# Patient Record
Sex: Male | Born: 1950 | Race: Black or African American | Hispanic: No | Marital: Married | Smoking: Former smoker
Health system: Southern US, Community
[De-identification: ages and names within clinical notes are randomized; demographics above are authoritative.]

## PROBLEM LIST (undated history)

## (undated) DIAGNOSIS — I10 Essential (primary) hypertension: Secondary | ICD-10-CM

## (undated) DIAGNOSIS — I219 Acute myocardial infarction, unspecified: Secondary | ICD-10-CM

## (undated) DIAGNOSIS — R4182 Altered mental status, unspecified: Secondary | ICD-10-CM

## (undated) DIAGNOSIS — M545 Low back pain, unspecified: Secondary | ICD-10-CM

## (undated) DIAGNOSIS — G8929 Other chronic pain: Secondary | ICD-10-CM

## (undated) DIAGNOSIS — I5022 Chronic systolic (congestive) heart failure: Secondary | ICD-10-CM

## (undated) DIAGNOSIS — I2510 Atherosclerotic heart disease of native coronary artery without angina pectoris: Secondary | ICD-10-CM

## (undated) DIAGNOSIS — M1990 Unspecified osteoarthritis, unspecified site: Secondary | ICD-10-CM

## (undated) DIAGNOSIS — E785 Hyperlipidemia, unspecified: Secondary | ICD-10-CM

## (undated) DIAGNOSIS — I160 Hypertensive urgency: Secondary | ICD-10-CM

## (undated) DIAGNOSIS — M797 Fibromyalgia: Secondary | ICD-10-CM

## (undated) DIAGNOSIS — E119 Type 2 diabetes mellitus without complications: Secondary | ICD-10-CM

## (undated) DIAGNOSIS — I639 Cerebral infarction, unspecified: Secondary | ICD-10-CM

## (undated) HISTORY — PX: ANKLE FRACTURE SURGERY: SHX122

## (undated) HISTORY — DX: Altered mental status, unspecified: R41.82

## (undated) HISTORY — PX: CORONARY ANGIOPLASTY WITH STENT PLACEMENT: SHX49

## (undated) HISTORY — PX: FRACTURE SURGERY: SHX138

---

## 2010-07-20 DIAGNOSIS — I639 Cerebral infarction, unspecified: Secondary | ICD-10-CM

## 2010-07-20 HISTORY — DX: Cerebral infarction, unspecified: I63.9

## 2013-07-20 DIAGNOSIS — I219 Acute myocardial infarction, unspecified: Secondary | ICD-10-CM

## 2013-07-20 HISTORY — DX: Acute myocardial infarction, unspecified: I21.9

## 2014-05-09 ENCOUNTER — Emergency Department (HOSPITAL_COMMUNITY): Payer: Medicaid Other

## 2014-05-09 ENCOUNTER — Inpatient Hospital Stay (HOSPITAL_COMMUNITY)
Admission: EM | Admit: 2014-05-09 | Discharge: 2014-05-12 | DRG: 247 | Disposition: A | Discharge status: Home / Self Care | Payer: Medicaid Other | Attending: Cardiology | Admitting: Cardiology

## 2014-05-09 ENCOUNTER — Encounter (HOSPITAL_COMMUNITY): Payer: Self-pay | Admitting: Emergency Medicine

## 2014-05-09 DIAGNOSIS — I255 Ischemic cardiomyopathy: Secondary | ICD-10-CM

## 2014-05-09 DIAGNOSIS — I200 Unstable angina: Secondary | ICD-10-CM | POA: Diagnosis present

## 2014-05-09 DIAGNOSIS — I5189 Other ill-defined heart diseases: Secondary | ICD-10-CM

## 2014-05-09 DIAGNOSIS — I959 Hypotension, unspecified: Secondary | ICD-10-CM | POA: Diagnosis not present

## 2014-05-09 DIAGNOSIS — I10 Essential (primary) hypertension: Secondary | ICD-10-CM

## 2014-05-09 DIAGNOSIS — R079 Chest pain, unspecified: Secondary | ICD-10-CM

## 2014-05-09 DIAGNOSIS — T465X5A Adverse effect of other antihypertensive drugs, initial encounter: Secondary | ICD-10-CM | POA: Diagnosis not present

## 2014-05-09 DIAGNOSIS — Z7982 Long term (current) use of aspirin: Secondary | ICD-10-CM

## 2014-05-09 DIAGNOSIS — R000 Tachycardia, unspecified: Secondary | ICD-10-CM | POA: Diagnosis present

## 2014-05-09 DIAGNOSIS — I25110 Atherosclerotic heart disease of native coronary artery with unstable angina pectoris: Secondary | ICD-10-CM

## 2014-05-09 DIAGNOSIS — I519 Heart disease, unspecified: Secondary | ICD-10-CM

## 2014-05-09 DIAGNOSIS — I2510 Atherosclerotic heart disease of native coronary artery without angina pectoris: Secondary | ICD-10-CM | POA: Diagnosis present

## 2014-05-09 DIAGNOSIS — I252 Old myocardial infarction: Secondary | ICD-10-CM

## 2014-05-09 DIAGNOSIS — I9589 Other hypotension: Secondary | ICD-10-CM

## 2014-05-09 DIAGNOSIS — E1140 Type 2 diabetes mellitus with diabetic neuropathy, unspecified: Secondary | ICD-10-CM | POA: Diagnosis present

## 2014-05-09 DIAGNOSIS — E119 Type 2 diabetes mellitus without complications: Secondary | ICD-10-CM

## 2014-05-09 DIAGNOSIS — Z794 Long term (current) use of insulin: Secondary | ICD-10-CM

## 2014-05-09 DIAGNOSIS — E869 Volume depletion, unspecified: Secondary | ICD-10-CM | POA: Diagnosis present

## 2014-05-09 DIAGNOSIS — E1165 Type 2 diabetes mellitus with hyperglycemia: Secondary | ICD-10-CM | POA: Diagnosis present

## 2014-05-09 DIAGNOSIS — Z9861 Coronary angioplasty status: Secondary | ICD-10-CM

## 2014-05-09 DIAGNOSIS — Z8673 Personal history of transient ischemic attack (TIA), and cerebral infarction without residual deficits: Secondary | ICD-10-CM

## 2014-05-09 DIAGNOSIS — I5022 Chronic systolic (congestive) heart failure: Secondary | ICD-10-CM | POA: Diagnosis present

## 2014-05-09 DIAGNOSIS — E785 Hyperlipidemia, unspecified: Secondary | ICD-10-CM | POA: Diagnosis present

## 2014-05-09 DIAGNOSIS — R072 Precordial pain: Secondary | ICD-10-CM | POA: Insufficient documentation

## 2014-05-09 HISTORY — DX: Hyperlipidemia, unspecified: E78.5

## 2014-05-09 HISTORY — DX: Chronic systolic (congestive) heart failure: I50.22

## 2014-05-09 HISTORY — DX: Essential (primary) hypertension: I10

## 2014-05-09 HISTORY — DX: Atherosclerotic heart disease of native coronary artery without angina pectoris: I25.10

## 2014-05-09 HISTORY — DX: Cerebral infarction, unspecified: I63.9

## 2014-05-09 LAB — BASIC METABOLIC PANEL
Anion gap: 17 — ABNORMAL HIGH (ref 5–15)
BUN: 16 mg/dL (ref 6–23)
CALCIUM: 9.8 mg/dL (ref 8.4–10.5)
CO2: 24 mEq/L (ref 19–32)
CREATININE: 1.17 mg/dL (ref 0.50–1.35)
Chloride: 95 mEq/L — ABNORMAL LOW (ref 96–112)
GFR calc non Af Amer: 65 mL/min — ABNORMAL LOW (ref >90)
GFR, EST AFRICAN AMERICAN: 75 mL/min — AB (ref >90)
Glucose, Bld: 449 mg/dL — ABNORMAL HIGH (ref 70–99)
Potassium: 4.3 mEq/L (ref 3.7–5.3)
Sodium: 136 mEq/L — ABNORMAL LOW (ref 137–147)

## 2014-05-09 LAB — I-STAT TROPONIN, ED: Troponin i, poc: 0.02 ng/mL (ref 0.00–0.08)

## 2014-05-09 LAB — CBC
HCT: 39.5 % (ref 39.0–52.0)
Hemoglobin: 14.3 g/dL (ref 13.0–17.0)
MCH: 28.1 pg (ref 26.0–34.0)
MCHC: 36.2 g/dL — AB (ref 30.0–36.0)
MCV: 77.6 fL — AB (ref 78.0–100.0)
PLATELETS: 204 10*3/uL (ref 150–400)
RBC: 5.09 MIL/uL (ref 4.22–5.81)
RDW: 13 % (ref 11.5–15.5)
WBC: 4.6 10*3/uL (ref 4.0–10.5)

## 2014-05-09 LAB — CBG MONITORING, ED: Glucose-Capillary: 424 mg/dL — ABNORMAL HIGH (ref 70–99)

## 2014-05-09 LAB — PRO B NATRIURETIC PEPTIDE: Pro B Natriuretic peptide (BNP): 855.8 pg/mL — ABNORMAL HIGH (ref 0–125)

## 2014-05-09 LAB — TROPONIN I

## 2014-05-09 LAB — PROTIME-INR
INR: 1.03 (ref 0.00–1.49)
Prothrombin Time: 13.6 seconds (ref 11.6–15.2)

## 2014-05-09 MED ORDER — SODIUM CHLORIDE 0.9 % IV SOLN
INTRAVENOUS | Status: AC
  Administered 2014-05-10 (×2): via INTRAVENOUS

## 2014-05-09 MED ORDER — CARVEDILOL 12.5 MG PO TABS
12.5000 mg | ORAL_TABLET | Freq: Two times a day (BID) | ORAL | Status: DC
  Administered 2014-05-09 – 2014-05-10 (×2): 12.5 mg via ORAL
  Filled 2014-05-09 (×3): qty 1

## 2014-05-09 MED ORDER — NITROGLYCERIN 0.4 MG SL SUBL
0.4000 mg | SUBLINGUAL_TABLET | SUBLINGUAL | Status: AC | PRN
  Administered 2014-05-09 (×3): 0.4 mg via SUBLINGUAL
  Filled 2014-05-09: qty 1

## 2014-05-09 MED ORDER — ASPIRIN EC 325 MG PO TBEC
325.0000 mg | DELAYED_RELEASE_TABLET | Freq: Once | ORAL | Status: AC
  Administered 2014-05-09: 325 mg via ORAL
  Filled 2014-05-09: qty 1

## 2014-05-09 MED ORDER — INSULIN GLARGINE 100 UNIT/ML ~~LOC~~ SOLN
22.0000 [IU] | Freq: Every day | SUBCUTANEOUS | Status: DC
  Administered 2014-05-10 – 2014-05-11 (×3): 22 [IU] via SUBCUTANEOUS
  Filled 2014-05-09 (×4): qty 0.22

## 2014-05-09 MED ORDER — INSULIN GLARGINE 100 UNIT/ML ~~LOC~~ SOLN
16.0000 [IU] | Freq: Every day | SUBCUTANEOUS | Status: DC
  Administered 2014-05-10 – 2014-05-12 (×3): 16 [IU] via SUBCUTANEOUS
  Filled 2014-05-09 (×3): qty 0.16

## 2014-05-09 MED ORDER — INSULIN GLARGINE 100 UNIT/ML ~~LOC~~ SOLN: 16.0000 [IU] | Freq: Two times a day (BID) | SUBCUTANEOUS | Status: DC

## 2014-05-09 NOTE — ED Provider Notes (Signed)
CSN: [id number]     Arrival date & time 05/09/14  2044 History   First MD Initiated Contact with Patient 05/09/14 2138     Chief Complaint  Patient presents with  . Chest Pain    Patient is a 63 y.o. male presenting with chest pain. The history is provided by the patient.  Chest Pain Pain location:  Substernal area Pain quality comment:  "it just hurts" Pain radiates to:  Does not radiate Pain radiates to the back: no   Onset quality:  Gradual Duration:  2 weeks Timing:  Constant Progression:  Worsening Chronicity:  Recurrent ("it feels like a heart attack") Worsened by:  Nothing tried Ineffective treatments:  Aspirin Associated symptoms: no abdominal pain, no back pain, no cough, no fever, no headache, no nausea, no shortness of breath and not vomiting    Patient with a history of CAD status post stents (not currently anticoagulated) presents with chest pain. Pain is been ongoing for weeks but the patient reports that the pain got worse today and was sharp in nature. Pain does not radiate to his neck, jaw, arm or back. Patient takes 81 aspirin per day. Patient reports leg swelling.  Past Medical History  Diagnosis Date  . Coronary artery disease   . Diabetes mellitus without complication   . Hypertension   . Stroke    Past Surgical History  Procedure Laterality Date  . Ankle surgery    . Coronary stent placement     No family history on file. History  Substance Use Topics  . Smoking status: Never Smoker   . Smokeless tobacco: Not on file  . Alcohol Use: No    Review of Systems  Constitutional: Negative for fever and chills.  HENT: Negative for rhinorrhea and sore throat.   Eyes: Negative for visual disturbance.  Respiratory: Negative for cough and shortness of breath.   Cardiovascular: Positive for chest pain and leg swelling.  Gastrointestinal: Negative for nausea, vomiting, abdominal pain, diarrhea and constipation.  Genitourinary: Negative for dysuria and  hematuria.  Musculoskeletal: Negative for back pain and neck pain.  Skin: Negative for rash.  Neurological: Negative for syncope and headaches.  Psychiatric/Behavioral: Negative for confusion.  All other systems reviewed and are negative.     Allergies  Review of patient's allergies indicates no known allergies.  Home Medications   Prior to Admission medications   Medication Sig Start Date End Date Taking? Authorizing Provider  aspirin EC 81 MG tablet Take 81 mg by mouth daily.   Yes Historical Provider, MD  furosemide (LASIX) 20 MG tablet Take 20 mg by mouth daily.   Yes Historical Provider, MD  insulin glargine (LANTUS) 100 UNIT/ML injection Inject 16-22 Units into the skin 2 (two) times daily. Take 16 units every morning and 22 units at bedtime.   Yes Historical Provider, MD   BP 140/98  Pulse 127  Temp(Src) 98.6 F (37 C) (Oral)  Resp 24  SpO2 98% Physical Exam  Constitutional: He is oriented to person, place, and time. He appears well-developed and well-nourished. No distress.  HENT:  Head: Normocephalic and atraumatic.  Mouth/Throat: Oropharynx is clear and moist.  Eyes: EOM are normal.  Neck: Neck supple. No JVD present.  Cardiovascular: Regular rhythm, normal heart sounds and intact distal pulses.  Tachycardia present.   Pulmonary/Chest: Effort normal and breath sounds normal.  Abdominal: Soft. He exhibits no distension. There is no tenderness.  Musculoskeletal: Normal range of motion. He exhibits no edema.  Chronic LE  skin changes bilaterally, no pitting edema  Neurological: He is alert and oriented to person, place, and time. No cranial nerve deficit.  Skin: Skin is warm and dry.  Psychiatric: His behavior is normal.    ED Course  Procedures  None  Labs Review Labs Reviewed  CBC - Abnormal; Notable for the following:    MCV 77.6 (*)    MCHC 36.2 (*)    All other components within normal limits  BASIC METABOLIC PANEL - Abnormal; Notable for the  following:    Sodium 136 (*)    Chloride 95 (*)    Glucose, Bld 449 (*)    GFR calc non Af Amer 65 (*)    GFR calc Af Amer 75 (*)    Anion gap 17 (*)    All other components within normal limits  PRO B NATRIURETIC PEPTIDE - Abnormal; Notable for the following:    Pro B Natriuretic peptide (BNP) 855.8 (*)    All other components within normal limits  PROTIME-INR  TROPONIN I  Randolm Idol, ED    Imaging Review Dg Chest 2 View  05/09/2014   CLINICAL DATA:  63 year old male with acute chest pain and shortness of Breath. Initial encounter.  EXAM: CHEST  2 VIEW  COMPARISON:  None.  FINDINGS: Normal lung volumes. Normal cardiac size and mediastinal contours. Visualized tracheal air column is within normal limits. The lungs are clear. No pneumothorax or effusion. No osseous abnormality identified.  IMPRESSION: Negative, no acute cardiopulmonary abnormality.   Electronically Signed   By: Lars Pinks M.D.   On: 05/09/2014 21:23     EKG Interpretation   Date/Time:  Wednesday May 09 2014 20:48:17 EDT Ventricular Rate:  137 PR Interval:  132 QRS Duration: 92 QT Interval:  300 QTC Calculation: 453 R Axis:   -152 Text Interpretation:  Sinus tachycardia Biatrial enlargement Right  superior axis deviation Pulmonary disease pattern ST \\T \ T wave  abnormality, consider inferolateral ischemia Abnormal ECG Confirmed by  ZAVITZ  MD, JOSHUA ([id number]) on 05/09/2014 10:30:31 PM      MDM   Final diagnoses:  Chest pain, unspecified chest pain type    Patient with history of CAD status post stents presents with chest pain. EKG demonstrates sinus tachycardia, rate 121, nonspecific T wave changes. No ST segment elevation or depression. No prior EKG. Patient is from [place], [place] and had cardiology management there.  BNP elevated at 855. Cardiology consulted and will admit patient. Per their recommendation, Coreg 12.5 mg twice a day started in the ED.   Case discussed with Dr. Reather Converse.   Gustavus Bryant, MD 05/09/14 2250

## 2014-05-09 NOTE — ED Provider Notes (Signed)
Medical screening examination/treatment/procedure(s) were conducted as a shared visit with non-physician practitioner(s) or resident  and myself.  I personally evaluated the patient during the encounter and agree with the findings.   I have personally reviewed any xrays and/ or EKG's with the provider and I agree with interpretation.   Patient with known coronary disease blood pressure presents with intermittent left chest pain for the past couple weeks. Patient has had recent exertional symptoms.  Patient has 2 cardiac stents and stop taking blood thinners after 2 years per his report. On exam patient denies significant chest pain at this time, nontoxic appearing, lungs clear, tachycardic, mild dry mucous membranes, blood pressure elevated in ER. Cardiology evaluated and admitted for further evaluation and treatment. EKG reviewed, multiple T wave inversions, no old on file. Pt had asa. Dg Chest 2 View  05/09/2014   CLINICAL DATA:  63 year old male with acute chest pain and shortness of Breath. Initial encounter.  EXAM: CHEST  2 VIEW  COMPARISON:  None.  FINDINGS: Normal lung volumes. Normal cardiac size and mediastinal contours. Visualized tracheal air column is within normal limits. The lungs are clear. No pneumothorax or effusion. No osseous abnormality identified.  IMPRESSION: Negative, no acute cardiopulmonary abnormality.   Electronically Signed   By: Lars Pinks M.D.   On: 05/09/2014 21:23   Acute chest pain, CAD  Mariea Clonts, MD 05/09/14 2303

## 2014-05-09 NOTE — ED Notes (Signed)
Pt. reports intermittent left chest pain for several weeks worse this evening with SOB / feet swelling . Denies diaphoresis and nausea. History of CAD with 2 coronary stents.

## 2014-05-09 NOTE — H&P (Signed)
Patient ID: Antonio Guerra MRN: [id number], DOB/AGE: 62-Sep-1952   Admit date: 05/09/2014   Primary Physician: No primary provider on file. Primary Cardiologist: None  Chief complaint: Chest pain  Problem List  Past Medical History  Diagnosis Date  . Coronary artery disease   . Diabetes mellitus without complication   . Hypertension   . Stroke     Past Surgical History  Procedure Laterality Date  . Ankle surgery    . Coronary stent placement       Allergies  No Known Allergies  HPI 63M with HTN, DM controlled with insulin, CAD s/p PCI in 2010-2011 (at [facility] in [place]), CHF by report presenting with 1 month of chest discomfort worsening in the last 24 hours. He reports a longstanding history of chest discomfort for the past month or so, exacerbated primarily by stress, but states that this morning his chest discomfort significantly worsened. He described it as an aching pressure worsened by exertion and stress, alleviated by rest. Given the acute worsening of his symptoms he presented to the ED today.  At home, he reports that he is not active but this is due primarily to leg pain thought to be 2/2 diabetic neuropathy. He does not describe shortness of breath or chest pain at rest.  In the ED,  HR 134 regular, BP 156/90, O2 sat 100% on RA. He was given SL NTG x 3 with resolution of his symptoms. His first cTn was negative.   Risk factors:  HTN - denies HLD - Y DM - yes, poorly controlled CAD - Y Smoker - N  Home Medications  Prior to Admission medications   Medication Sig Start Date End Date Taking? Authorizing Provider  aspirin EC 81 MG tablet Take 81 mg by mouth daily.   Yes Historical Provider, MD  furosemide (LASIX) 20 MG tablet Take 20 mg by mouth daily.   Yes Historical Provider, MD  insulin glargine (LANTUS) 100 UNIT/ML injection Inject 16-22 Units into the skin 2 (two) times daily. Take 16 units every morning and 22 units at bedtime.   Yes  Historical Provider, MD    Family History  Noncontributory  Social History  History   Social History  . Marital Status: Single    Spouse Name: N/A    Number of Children: N/A  . Years of Education: N/A   Occupational History  . Not on file.   Social History Main Topics  . Smoking status: Never Smoker   . Smokeless tobacco: Not on file  . Alcohol Use: No  . Drug Use: No  . Sexual Activity: Not on file   Other Topics Concern  . Not on file   Social History Narrative  . No narrative on file     Review of Systems General:  No chills, fever, night sweats or weight changes.  Cardiovascular:  + chest pain, dyspnea on exertion, denies edema, orthopnea, palpitations, paroxysmal nocturnal dyspnea. Dermatological: No rash, lesions/masses Respiratory: No cough, dyspnea Urologic: No hematuria, dysuria Abdominal:   No nausea, vomiting, diarrhea, bright red blood per rectum, melena, or hematemesis Neurologic:  No visual changes, wkns, changes in mental status. No history of bleeding All other systems reviewed and are otherwise negative except as noted above.  Physical Exam  Blood pressure 140/97, pulse 125, temperature 98.6 F (37 C), temperature source Oral, resp. rate 20, SpO2 97.00%.  General: Pleasant, NAD Psych: Normal affect. Neuro: Alert and oriented X 3. Moves all extremities spontaneously. HEENT: Normal  Neck: Supple without bruits or JVD. Lungs:  Resp regular and unlabored, CTA. Heart: RRR no s3, s4, or murmurs. Abdomen: Soft, non-tender, non-distended, BS + x 4.  Extremities: No clubbing, cyanosis or edema. DP/PT/Radials 2+ and equal bilaterally.  Labs  Troponin [facility] of Care Test)  Recent Labs  05/09/14 2102  TROPIPOC 0.02    Recent Labs  05/09/14 2053  TROPONINI <0.30   Lab Results  Component Value Date   WBC 4.6 05/09/2014   HGB 14.3 05/09/2014   HCT 39.5 05/09/2014   MCV 77.6* 05/09/2014   PLT 204 05/09/2014    Recent Labs Lab  05/09/14 2054  NA 136*  K 4.3  CL 95*  CO2 24  BUN 16  CREATININE 1.17  CALCIUM 9.8  GLUCOSE 449*   No results found for this basename: CHOL, HDL, LDLCALC, TRIG   No results found for this basename: DDIMER     Radiology/Studies  Dg Chest 2 View  05/09/2014   CLINICAL DATA:  63 year old male with acute chest pain and shortness of Breath. Initial encounter.  EXAM: CHEST  2 VIEW  COMPARISON:  None.  FINDINGS: Normal lung volumes. Normal cardiac size and mediastinal contours. Visualized tracheal air column is within normal limits. The lungs are clear. No pneumothorax or effusion. No osseous abnormality identified.  IMPRESSION: Negative, no acute cardiopulmonary abnormality.   Electronically Signed   By: Lars Pinks M.D.   On: 05/09/2014 21:23   ECG Narrow complex tachycardiac at 130bpm with diffuse STD. Likely sinus tachycardia but unable to definitively assess.   ASSESSMENT AND PLAN 38M with a history of CAD s/p PCI, CHF, diabetes, with chest discomfort.  #Chest discomfort: Unclear etiology for his chest discomfort but I am concerned there may be an ischemic component to it given his prior cardiac history and poorly controlled diabetes. However, at this point his first set of cardiac markers are negative and he is chest pain free.  -Try to obtain prior cath records from [facility] in the AM -Continue ASA -Start carvedilol, atorvastatin for now; likely would benefit from ACEi but will defer overnight -serial cardiac markers -TTE to evaluate cardiac function -if markers are negative, can consider stress versus cath based on prior records from [facility].   #Tachycardia: At this point I favor sinus tach. There may be an element of volume depletion, especially considering his markedly elevated BG -NS @250 /hr x 1 hr -carvedilol 12.5mg  bid to start given hypertension and tachycardia -continue to monitor. If no improvement tomorrow can consider adenosine to determine precise etiology of  tachycardia  #Diabetes: His initial BG was 449. Will treat with fluids and insulin overnight -Home Lantus -Additional qhs coverage -IVF as above -will obtain A1c  FULL CODE  Signed, Raliegh Ip, MD MPH 05/09/2014, 11:10 PM

## 2014-05-10 ENCOUNTER — Encounter (HOSPITAL_COMMUNITY): Payer: Self-pay

## 2014-05-10 DIAGNOSIS — I639 Cerebral infarction, unspecified: Secondary | ICD-10-CM | POA: Insufficient documentation

## 2014-05-10 DIAGNOSIS — I059 Rheumatic mitral valve disease, unspecified: Secondary | ICD-10-CM

## 2014-05-10 DIAGNOSIS — I10 Essential (primary) hypertension: Secondary | ICD-10-CM | POA: Diagnosis present

## 2014-05-10 DIAGNOSIS — E119 Type 2 diabetes mellitus without complications: Secondary | ICD-10-CM

## 2014-05-10 DIAGNOSIS — R079 Chest pain, unspecified: Secondary | ICD-10-CM

## 2014-05-10 LAB — CBC
HEMATOCRIT: 37.4 % — AB (ref 39.0–52.0)
Hemoglobin: 13.1 g/dL (ref 13.0–17.0)
MCH: 27.2 pg (ref 26.0–34.0)
MCHC: 35 g/dL (ref 30.0–36.0)
MCV: 77.8 fL — AB (ref 78.0–100.0)
Platelets: 193 10*3/uL (ref 150–400)
RBC: 4.81 MIL/uL (ref 4.22–5.81)
RDW: 13 % (ref 11.5–15.5)
WBC: 6.8 10*3/uL (ref 4.0–10.5)

## 2014-05-10 LAB — TROPONIN I: Troponin I: <0.3 ng/mL (ref <0.30)

## 2014-05-10 LAB — BASIC METABOLIC PANEL
Anion gap: 14 (ref 5–15)
BUN: 15 mg/dL (ref 6–23)
CO2: 24 meq/L (ref 19–32)
CREATININE: 1.1 mg/dL (ref 0.50–1.35)
Calcium: 9.7 mg/dL (ref 8.4–10.5)
Chloride: 100 mEq/L (ref 96–112)
GFR calc Af Amer: 81 mL/min — ABNORMAL LOW (ref >90)
GFR calc non Af Amer: 70 mL/min — ABNORMAL LOW (ref >90)
Glucose, Bld: 366 mg/dL — ABNORMAL HIGH (ref 70–99)
Potassium: 4.2 mEq/L (ref 3.7–5.3)
SODIUM: 138 meq/L (ref 137–147)

## 2014-05-10 LAB — GLUCOSE, CAPILLARY
Glucose-Capillary: 222 mg/dL — ABNORMAL HIGH (ref 70–99)
Glucose-Capillary: 316 mg/dL — ABNORMAL HIGH (ref 70–99)
Glucose-Capillary: 337 mg/dL — ABNORMAL HIGH (ref 70–99)
Glucose-Capillary: 272 mg/dL — ABNORMAL HIGH (ref 70–99)

## 2014-05-10 LAB — HEMOGLOBIN A1C
Hgb A1c MFr Bld: 10 % — ABNORMAL HIGH (ref <5.7)
Mean Plasma Glucose: 240 mg/dL — ABNORMAL HIGH (ref <117)

## 2014-05-10 LAB — MAGNESIUM: MAGNESIUM: 2 mg/dL (ref 1.5–2.5)

## 2014-05-10 MED ORDER — ATORVASTATIN CALCIUM 80 MG PO TABS
80.0000 mg | ORAL_TABLET | Freq: Every day | ORAL | Status: DC
  Administered 2014-05-10 – 2014-05-11 (×2): 80 mg via ORAL
  Filled 2014-05-10 (×4): qty 1

## 2014-05-10 MED ORDER — CARVEDILOL 25 MG PO TABS
25.0000 mg | ORAL_TABLET | Freq: Two times a day (BID) | ORAL | Status: DC
  Administered 2014-05-10 – 2014-05-12 (×4): 25 mg via ORAL
  Filled 2014-05-10 (×4): qty 1
  Filled 2014-05-10: qty 2
  Filled 2014-05-10 (×2): qty 1

## 2014-05-10 MED ORDER — ACETAMINOPHEN 325 MG PO TABS
650.0000 mg | ORAL_TABLET | ORAL | Status: DC | PRN
  Administered 2014-05-10 – 2014-05-11 (×2): 650 mg via ORAL
  Filled 2014-05-10 (×2): qty 2

## 2014-05-10 MED ORDER — ONDANSETRON HCL 4 MG/2ML IJ SOLN: 4.0000 mg | Freq: Four times a day (QID) | INTRAMUSCULAR | Status: DC | PRN

## 2014-05-10 MED ORDER — GABAPENTIN 600 MG PO TABS
300.0000 mg | ORAL_TABLET | Freq: Two times a day (BID) | ORAL | Status: DC
  Administered 2014-05-10 – 2014-05-11 (×3): 300 mg via ORAL
  Filled 2014-05-10 (×3): qty 1
  Filled 2014-05-10: qty 0.5

## 2014-05-10 MED ORDER — HEPARIN SODIUM (PORCINE) 5000 UNIT/ML IJ SOLN
5000.0000 [IU] | Freq: Three times a day (TID) | INTRAMUSCULAR | Status: DC
  Administered 2014-05-10 (×4): 5000 [IU] via SUBCUTANEOUS
  Filled 2014-05-10 (×5): qty 1

## 2014-05-10 MED ORDER — ASPIRIN EC 81 MG PO TBEC
81.0000 mg | DELAYED_RELEASE_TABLET | Freq: Every day | ORAL | Status: DC
  Administered 2014-05-10 – 2014-05-12 (×3): 81 mg via ORAL
  Filled 2014-05-10 (×3): qty 1

## 2014-05-10 NOTE — Progress Notes (Signed)
Patient has discomfort in his feet rating the pain 8/10, aching. Provided 650mg  of acetaminophen . Oval Linsey, RN

## 2014-05-10 NOTE — Progress Notes (Signed)
UR completed 

## 2014-05-10 NOTE — Progress Notes (Signed)
Nutrition Brief Note  Patient identified on the Malnutrition Screening Tool (MST) Report for weight loss. Patient reports usual weight of 210 lb, 5% weight loss in 2 months is not significant. He eats well at home, recently started eating healthy foods that his daughter prepares.  Wt Readings from Last 15 Encounters:  05/09/14 199 lb 1.6 oz (90.311 kg)    Body mass index is 26.27 kg/(m^2). Patient meets criteria for overweight based on current BMI.   Current diet order is CHO-modified, patient is consuming approximately 100% of meals at this time. Labs and medications reviewed.   No nutrition interventions warranted at this time. If nutrition issues arise, please consult RD.   Molli Barrows, RD, LDN, [place] Pager [phone] After Hours Pager [phone]

## 2014-05-10 NOTE — Progress Notes (Signed)
Patient Name: Antonio Guerra Date of Encounter: 05/10/2014  Primary Cardiologist: New (previously follow by Dr. 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 Also had CHF during his 2011 admission. He states his original MI symptom was sharp L sided chest pain with SOB. This current episode of CP feels different, duall pain over entire anterior chest. He states he has been under a lot of stress recently. His wife has been having seizures. They recently moved to [facility] to live with their 2 daughters. He also has very bad LE pin and needle sensation. States he had stroke in 2012  CURRENT MEDS . aspirin EC  81 mg Oral Daily  . atorvastatin  80 mg Oral q1800  . carvedilol  12.5 mg Oral BID WC  . heparin  5,000 Units Subcutaneous 3 times per day  . insulin glargine  16 Units Subcutaneous Daily   And  . insulin glargine  22 Units Subcutaneous QHS    OBJECTIVE  Filed Vitals:   05/09/14 2330 05/09/14 2356 05/10/14 0500 05/10/14 0748  BP: 150/100 148/91 141/87 139/88  Pulse: 121 117 102 101  Temp:  99.1 F (37.3 C) 98.2 F (36.8 C) 97.9 F (36.6 C)  TempSrc:  Oral Oral Oral  Resp: 24 22 20 19   Height:  6\' 1"  (1.854 m)    Weight:  199 lb 1.6 oz (90.311 kg)    SpO2: 97% 99% 100% 100%    Intake/Output Summary (Last 24 hours) at 05/10/14 1013 Last data filed at 05/10/14 0851  Gross per 24 hour  Intake    240 ml  Output      0 ml  Net    240 ml   Filed Weights   05/09/14 2356  Weight: 199 lb 1.6 oz (90.311 kg)    PHYSICAL EXAM  General: Pleasant, NAD. Neuro: Alert and oriented X 3. Moves all extremities spontaneously. Psych: Normal affect. HEENT:  Normal  Neck: Supple without  bruits or JVD. Lungs:  Resp regular and unlabored, CTA. Heart: tachycardic no s3, s4, or murmurs. Abdomen: Soft, non-tender, non-distended, BS + x 4.  Extremities: No clubbing, cyanosis or edema. DP/PT/Radials 2+ and equal bilaterally.  Accessory Clinical Findings  CBC  Recent Labs  05/09/14 2054 05/10/14 0054  WBC 4.6 6.8  HGB 14.3 13.1  HCT 39.5 37.4*  MCV 77.6* 77.8*  PLT 204 798   Basic Metabolic Panel  Recent Labs  05/09/14 2054 05/10/14 0054  NA 136* 138  K 4.3 4.2  CL 95* 100  CO2 24 24  GLUCOSE 449* 366*  BUN 16 15  CREATININE 1.17 1.10  CALCIUM 9.8 9.7  MG  --  2.0   Cardiac Enzymes  Recent Labs  05/10/14 0008 05/10/14 0335 05/10/14 0505  TROPONINI <0.30 <0.30 <0.30    TELE Sinus tach with HR low 100 to 110s, no significant ventricular ectopy    ECG  EKG shows sinus tach with TWI in inferolateral leads  Echocardiogram  pending    Radiology/Studies  Dg Chest 2 View  05/09/2014   CLINICAL DATA:  63 year old male with acute chest pain and shortness of Breath. Initial encounter.  EXAM: CHEST  2 VIEW  COMPARISON:  None.  FINDINGS: Normal lung volumes. Normal cardiac size and mediastinal contours. Visualized tracheal air column is within normal limits. The lungs are clear. No pneumothorax or effusion. No osseous abnormality identified.  IMPRESSION: Negative, no acute cardiopulmonary abnormality.   Electronically Signed   By: Lars Pinks M.D.   On: 05/09/2014 21:23    ASSESSMENT AND PLAN  1. Chest pain  - serial trop negative, instructed nurse to obtain record from [facility]  - not on BP med at home, only on ASA, lasix and insulin  - coreg and lipitor added, pending echo today  - per patient, symptom different from previous MI, symptom has both typical and atypical feature. EKG shows tachycardia with TWI in inferolateral leads. Will discuss with Dr. Percival Spanish regarding plan for stress test, however tachycardic with HR 100s.   - Since ate food  this morning, will plan to increase coreg dose to 25mg  BID, slow heart rate down and possibly stress test tomorrow AM  2. Tachycardia  - s/p IVF for suspected volume depletion  3. CAD s/p CI in 2010-2011  - at [facility] in [place]  4. H/o CHF 5. HTN 6. IDDM: pending A1C, glucose 200-300  - will followup with A1C, likely uncontrolled, has symptom of severe diabetic neuropathy in LE, consider add Neurontin, may need to consult IM for diabetes management  7. H/o stroke in 2012, no record  Signed, Woodward Ku Pager: [phone]  History and all data above reviewed.  Patient examined.  I agree with the findings as above.  Currently without pain in his chest.  His biggest issue has been his neuropathy .    The patient exam reveals COR:RRR  ,  Lungs: Clear  ,  Abd: .bowle, Ext No edema  .  All available labs, radiology testing, previous records reviewed. Agree with documented assessment and plan. Chest pain:  No objective evidence of ischemia.  I am going to wait until I can review the echo and hopefully old records prior to considering cath vs. Lexiscan Myoview.  I am leaning toward [employer].  I will start a low dose of Neurontin.   Jeneen Rinks Hyun Reali  12:13 PM  05/10/2014

## 2014-05-10 NOTE — Progress Notes (Signed)
  Echocardiogram 2D Echocardiogram has been performed.  Antonio Guerra M 05/10/2014, 12:03 PM

## 2014-05-10 NOTE — Plan of Care (Signed)
Problem: Phase I Progression Outcomes Goal: Voiding-avoid urinary catheter unless indicated Outcome: Progressing Patient uses urinal or ambulates to bath room with assistance  Problem: Phase III Progression Outcomes Goal: No anginal pain Outcome: Progressing Rates pain 2/10

## 2014-05-10 NOTE — Progress Notes (Signed)
Inpatient Diabetes Program Recommendations  AACE/ADA: New Consensus Statement on Inpatient Glycemic Control (2013)  Target Ranges:  Prepandial:   less than 140 mg/dL      Peak postprandial:   less than 180 mg/dL (1-2 hours)      Critically ill patients:  140 - 180 mg/dL   Results for Antonio Guerra, Antonio Guerra (MRN [id number]) as of 05/10/2014 08:26  Ref. Range 05/09/2014 23:29 05/10/2014 07:47  Glucose-Capillary Latest Range: 70-99 mg/dL 424 (H) 222 (H)   Diabetes history: DM Outpatient Diabetes medications: Lantus 16 units QAM, Lantus 22 units QHS Current orders for Inpatient glycemic control: Lantus 16 units QAM, Lantus 22 units QHS  Inpatient Diabetes Program Recommendations Insulin - Basal: Please consider increasing am dose of Lantus to 20 units daily. Correction (SSI): Please order CBGs and Novolog moderate correction scale ACHS.  Thanks, Barnie Alderman, RN, MSN, CCRN Diabetes Coordinator Inpatient Diabetes Program [phone] (Team Pager) [phone] ([facility] office) [phone] [facility] office)

## 2014-05-11 ENCOUNTER — Encounter (HOSPITAL_COMMUNITY)
Admission: EM | Disposition: A | Discharge status: Home / Self Care | Payer: Medicaid Other | Source: Home / Self Care | Attending: Cardiology

## 2014-05-11 DIAGNOSIS — I5022 Chronic systolic (congestive) heart failure: Secondary | ICD-10-CM | POA: Diagnosis present

## 2014-05-11 DIAGNOSIS — I255 Ischemic cardiomyopathy: Secondary | ICD-10-CM | POA: Diagnosis present

## 2014-05-11 DIAGNOSIS — Z7982 Long term (current) use of aspirin: Secondary | ICD-10-CM | POA: Diagnosis not present

## 2014-05-11 DIAGNOSIS — E1165 Type 2 diabetes mellitus with hyperglycemia: Secondary | ICD-10-CM | POA: Diagnosis present

## 2014-05-11 DIAGNOSIS — Z9861 Coronary angioplasty status: Secondary | ICD-10-CM | POA: Diagnosis not present

## 2014-05-11 DIAGNOSIS — I10 Essential (primary) hypertension: Secondary | ICD-10-CM | POA: Diagnosis present

## 2014-05-11 DIAGNOSIS — I200 Unstable angina: Secondary | ICD-10-CM | POA: Diagnosis present

## 2014-05-11 DIAGNOSIS — E1140 Type 2 diabetes mellitus with diabetic neuropathy, unspecified: Secondary | ICD-10-CM | POA: Diagnosis present

## 2014-05-11 DIAGNOSIS — I25110 Atherosclerotic heart disease of native coronary artery with unstable angina pectoris: Secondary | ICD-10-CM | POA: Diagnosis present

## 2014-05-11 DIAGNOSIS — Z8673 Personal history of transient ischemic attack (TIA), and cerebral infarction without residual deficits: Secondary | ICD-10-CM | POA: Diagnosis not present

## 2014-05-11 DIAGNOSIS — R079 Chest pain, unspecified: Secondary | ICD-10-CM | POA: Diagnosis present

## 2014-05-11 DIAGNOSIS — Z794 Long term (current) use of insulin: Secondary | ICD-10-CM

## 2014-05-11 DIAGNOSIS — E119 Type 2 diabetes mellitus without complications: Secondary | ICD-10-CM | POA: Diagnosis present

## 2014-05-11 DIAGNOSIS — R000 Tachycardia, unspecified: Secondary | ICD-10-CM | POA: Diagnosis present

## 2014-05-11 DIAGNOSIS — T465X5A Adverse effect of other antihypertensive drugs, initial encounter: Secondary | ICD-10-CM | POA: Diagnosis not present

## 2014-05-11 DIAGNOSIS — E118 Type 2 diabetes mellitus with unspecified complications: Secondary | ICD-10-CM

## 2014-05-11 DIAGNOSIS — E785 Hyperlipidemia, unspecified: Secondary | ICD-10-CM | POA: Diagnosis present

## 2014-05-11 DIAGNOSIS — E869 Volume depletion, unspecified: Secondary | ICD-10-CM | POA: Diagnosis present

## 2014-05-11 DIAGNOSIS — I252 Old myocardial infarction: Secondary | ICD-10-CM | POA: Diagnosis not present

## 2014-05-11 DIAGNOSIS — I959 Hypotension, unspecified: Secondary | ICD-10-CM | POA: Diagnosis not present

## 2014-05-11 HISTORY — PX: LEFT HEART CATHETERIZATION WITH CORONARY/GRAFT ANGIOGRAM: SHX5450

## 2014-05-11 HISTORY — PX: FRACTIONAL FLOW RESERVE WIRE: SHX5839

## 2014-05-11 HISTORY — PX: PERCUTANEOUS CORONARY STENT INTERVENTION (PCI-S): SHX5485

## 2014-05-11 LAB — GLUCOSE, CAPILLARY
GLUCOSE-CAPILLARY: 153 mg/dL — AB (ref 70–99)
Glucose-Capillary: 186 mg/dL — ABNORMAL HIGH (ref 70–99)
Glucose-Capillary: 99 mg/dL (ref 70–99)
Glucose-Capillary: 277 mg/dL — ABNORMAL HIGH (ref 70–99)

## 2014-05-11 LAB — POCT ACTIVATED CLOTTING TIME
ACTIVATED CLOTTING TIME: 247 s
Activated Clotting Time: 281 seconds

## 2014-05-11 SURGERY — LEFT HEART CATHETERIZATION WITH CORONARY/GRAFT ANGIOGRAM
Anesthesia: LOCAL

## 2014-05-11 MED ORDER — HEPARIN SODIUM (PORCINE) 1000 UNIT/ML IJ SOLN
INTRAMUSCULAR | Status: AC
  Filled 2014-05-11: qty 1

## 2014-05-11 MED ORDER — ADENOSINE 12 MG/4ML IV SOLN
16.0000 mL | Freq: Once | INTRAVENOUS | Status: DC
  Filled 2014-05-11: qty 16

## 2014-05-11 MED ORDER — CLOPIDOGREL BISULFATE 300 MG PO TABS
ORAL_TABLET | ORAL | Status: AC
  Filled 2014-05-11: qty 2

## 2014-05-11 MED ORDER — SODIUM CHLORIDE 0.9 % IV SOLN: INTRAVENOUS | Status: AC

## 2014-05-11 MED ORDER — SODIUM CHLORIDE 0.9 % IJ SOLN: 3.0000 mL | INTRAMUSCULAR | Status: DC | PRN

## 2014-05-11 MED ORDER — NITROGLYCERIN 1 MG/10 ML FOR IR/CATH LAB
INTRA_ARTERIAL | Status: AC
  Filled 2014-05-11: qty 10

## 2014-05-11 MED ORDER — GABAPENTIN 300 MG PO CAPS
300.0000 mg | ORAL_CAPSULE | Freq: Two times a day (BID) | ORAL | Status: DC
  Administered 2014-05-11 – 2014-05-12 (×2): 300 mg via ORAL
  Filled 2014-05-11 (×3): qty 1

## 2014-05-11 MED ORDER — FENTANYL CITRATE 0.05 MG/ML IJ SOLN
INTRAMUSCULAR | Status: AC
  Filled 2014-05-11: qty 2

## 2014-05-11 MED ORDER — SODIUM CHLORIDE 0.9 % IJ SOLN
3.0000 mL | Freq: Two times a day (BID) | INTRAMUSCULAR | Status: DC
  Administered 2014-05-11: 3 mL via INTRAVENOUS

## 2014-05-11 MED ORDER — MIDAZOLAM HCL 2 MG/2ML IJ SOLN
INTRAMUSCULAR | Status: AC
  Filled 2014-05-11: qty 2

## 2014-05-11 MED ORDER — LIDOCAINE HCL (PF) 1 % IJ SOLN
INTRAMUSCULAR | Status: AC
  Filled 2014-05-11: qty 30

## 2014-05-11 MED ORDER — CLOPIDOGREL BISULFATE 75 MG PO TABS
75.0000 mg | ORAL_TABLET | Freq: Every day | ORAL | Status: DC
  Administered 2014-05-12: 09:00:00 75 mg via ORAL
  Filled 2014-05-11: qty 1

## 2014-05-11 MED ORDER — VERAPAMIL HCL 2.5 MG/ML IV SOLN
INTRAVENOUS | Status: AC
  Filled 2014-05-11: qty 2

## 2014-05-11 MED ORDER — SODIUM CHLORIDE 0.9 % IV SOLN: 250.0000 mL | INTRAVENOUS | Status: DC | PRN

## 2014-05-11 MED ORDER — SODIUM CHLORIDE 0.9 % IV SOLN: INTRAVENOUS | Status: DC

## 2014-05-11 MED ORDER — LIVING BETTER WITH HEART FAILURE BOOK
Freq: Once | Status: AC
Start: 2014-05-11 — End: 2014-05-12
  Administered 2014-05-12: 08:00:00

## 2014-05-11 MED ORDER — ASPIRIN 81 MG PO CHEW
CHEWABLE_TABLET | ORAL | Status: AC
  Filled 2014-05-11: qty 3

## 2014-05-11 MED ORDER — LIVING WELL WITH DIABETES BOOK
Freq: Once | Status: AC
  Administered 2014-05-12: 08:00:00
  Filled 2014-05-11: qty 1

## 2014-05-11 MED ORDER — HEPARIN (PORCINE) IN NACL 2-0.9 UNIT/ML-% IJ SOLN
INTRAMUSCULAR | Status: AC
  Filled 2014-05-11: qty 1000

## 2014-05-11 MED FILL — Perflutren Lipid Microsphere IV Susp 1.1 MG/ML: INTRAVENOUS | Qty: 10 | Status: AC

## 2014-05-11 NOTE — Progress Notes (Signed)
TR BAND REMOVAL  LOCATION:    right radial  DEFLATED PER PROTOCOL:    Yes.    TIME BAND OFF / DRESSING APPLIED:    1900   SITE UPON ARRIVAL:    Level 0  SITE AFTER BAND REMOVAL:    Level 0  REVERSE ALLEN'S TEST:     positive  CIRCULATION SENSATION AND MOVEMENT:    Within Normal Limits   Yes.    COMMENTS:   Tolerated procedure well 

## 2014-05-11 NOTE — Interval H&P Note (Signed)
History and Physical Interval Note:  05/11/2014 2:15 PM  Antonio Guerra  has presented today for cardiac cath with the diagnosis of CAD/unstable angina.  The various methods of treatment have been discussed with the patient and family. After consideration of risks, benefits and other options for treatment, the patient has consented to  Procedure(s): LEFT HEART CATHETERIZATION WITH CORONARY/GRAFT ANGIOGRAM (N/A) as a surgical intervention .  The patient's history has been reviewed, patient examined, no change in status, stable for surgery.  I have reviewed the patient's chart and labs.  Questions were answered to the patient's satisfaction.    Cath Lab Visit (complete for each Cath Lab visit)  Clinical Evaluation Leading to the Procedure:   ACS: No.  Non-ACS:    Anginal Classification: CCS III  Anti-ischemic medical therapy: No Therapy  Non-Invasive Test Results: No non-invasive testing performed  Prior CABG: No previous CABG        [name]

## 2014-05-11 NOTE — CV Procedure (Addendum)
Cardiac Catheterization Operative Report  Trooper Olander [id number] 10/23/20153:41 PM No primary provider on file.  Procedure Performed:  1. Left Heart Catheterization 2. Selective Coronary Angiography 3. FFR of the LAD 4. PTCA/DES x 1 mid LAD  Operator: Lauree Chandler, MD  Arterial access site:  Right radial artery.   Indication: 63 yo male with history of CAD s/p prior PCI in [facility] admitted with unstable angina.                                       Procedure Details: The risks, benefits, complications, treatment options, and expected outcomes were discussed with the patient. The patient and/or family concurred with the proposed plan, giving informed consent. The patient was brought to the cath lab after IV hydration was begun and oral premedication was given. The patient was further sedated with Versed and Fentanyl. The right wrist was assessed with a modified Allens test which was positive. The right wrist was prepped and draped in a sterile fashion. 1% lidocaine was used for local anesthesia. Using the modified Seldinger access technique, a 5 French sheath was placed in the right radial artery. 3 mg Verapamil was given through the sheath. 4600 units IV heparin was given. Standard diagnostic catheters were used to perform selective coronary angiography. A pigtail catheter was used to cross the aortic valve and LV pressures were measured. No LV gram was performed. He was found to have restenosis of the mid LAD stent. The stenosis angiographically appeared to be moderately severe. I then elected to perform FFR of the LAD to assess the significance of the stenosis.   FFR Note: The patient was given 5000 units additional IV heparin. ACT was 245. An additional 2000 units IV heparin was given. I then engaged the left main with a XB LAD 3.5 guiding catheter. I advanced the flow wire down the LAD. Baseline FFR of 0.90. With infusion of IV adenosine for 2 minutes, the FFR was  0.80, suggesting the stenosis in the large caliber mid LAD was flow limiting. I then elected to proceed to PCI of the LAD.   PCI Note: The flow wire was in place in the LAD following the FFR procedure. ACT was 281. I then pre-dilated the stenosis in the mid LAD stented segment with a 2.5 x 20 mm balloon x 1. I then carefully positioned and deployed a 3.5 x 38 mm Xience DES in the mid LAD covering the entire previously stented segment and extending proximally to cover an area of moderate stenosis just before the old stent. The stent was post-dilated the stent with a 3.75 x 20 mm [place] balloon x 2. The stenosis was taken from 80% down to 0%. There was excellent flow into the distal vessel following the PCI. The sheath was removed from the right radial artery and a Vasc hemostasis band was applied at the arteriotomy site on the right wrist. There were no immediate complications. The patient was taken to the recovery area in stable condition.   Hemodynamic Findings: Central aortic pressure: 119/76 Left ventricular pressure: 119/12/16  Angiographic Findings:  Left main: Long segment with diffuse 10-20% stenosis.   Left Anterior Descending Artery: Large caliber vessel that courses to the apex. The proximal vessel has 40% stenosis. The mid vessel has a 60% stenosis followed by a patent mid stented segment. There is diffuse restenosis with more focal 80% restenosis  in the distal edge of the stented segment. Small distal diagonal branch. The LAD wraps around the apex. At the apex the vessel has a 50% stenosis.   Circumflex Artery: Moderate caliber vessel with diffuse 20% stenosis. Small caliber obtuse marginal branch. Large caliber ramus intermediate branch with 50% proximal stenosis, patent mid stent with diffuse 20% stent restenosis. There is a sub-branch of the intermediate that is small to moderate in caliber with diffuse proximal 40% stenosis.   Right Coronary Artery: Large dominant vessel with 40-50% mid  stenosis. The distal vessel has diffuse 30% stenosis.   Left Ventricular Angiogram: Deferred.   Impression: 1. Triple vessel CAD with patent intermediate branch stent, restenosis mid LAD stent  2. Unstable angina felt to be from restenotic lesion in the mid LAD stent, confirmed to be flow limiting by FFR.  3. Successful PTCA/DES x 1 mid LAD  Recommendations: He will need dual anti-platelet therapy with ASA and Plavix for at least one year. Continue statin and beta blocker. Discharge home in am if stable. He can follow up with Dr. Percival Spanish in 2-3 weeks.        Complications:  None. The patient tolerated the procedure well.

## 2014-05-11 NOTE — H&P (View-Only) (Signed)
    SUBJECTIVE:  Still having some chest pain.  No SOB.   PHYSICAL EXAM Filed Vitals:   05/10/14 0748 05/10/14 1205 05/10/14 2108 05/11/14 0635  BP: 139/88 134/89 140/93 133/85  Pulse: 101 103 101 102  Temp: 97.9 F (36.6 C) 98.2 F (36.8 C) 98.2 F (36.8 C) 98.4 F (36.9 C)  TempSrc: Oral Oral Oral Oral  Resp: 19 17    Height:      Weight:    201 lb (91.173 kg)  SpO2: 100% 100% 100% 98%   General:  No distress Lungs:  Clear Heart:  RRR Abdomen:  Positive bowel sounds, no rebound no guarding Extremities:  No edema   LABS: Lab Results  Component Value Date   TROPONINI <0.30 05/10/2014   Results for orders placed during the hospital encounter of 05/09/14 (from the past 24 hour(s))  GLUCOSE, CAPILLARY     Status: Abnormal   Collection Time    05/10/14 11:08 AM      Result Value Ref Range   Glucose-Capillary 316 (*) 70 - 99 mg/dL  GLUCOSE, CAPILLARY     Status: Abnormal   Collection Time    05/10/14  4:30 PM      Result Value Ref Range   Glucose-Capillary 337 (*) 70 - 99 mg/dL  GLUCOSE, CAPILLARY     Status: Abnormal   Collection Time    05/10/14  9:02 PM      Result Value Ref Range   Glucose-Capillary 272 (*) 70 - 99 mg/dL  GLUCOSE, CAPILLARY     Status: Abnormal   Collection Time    05/11/14  7:40 AM      Result Value Ref Range   Glucose-Capillary 186 (*) 70 - 99 mg/dL   Comment 1 Documented in Chart     Comment 2 Notify RN     No intake or output data in the 24 hours ending 05/11/14 1006   ASSESSMENT AND PLAN:  CHEST PAIN:  We sent a release of info to [facility] but have not gotten information back.    EF is severely reduced.   He will need a left heart cath.   I will start ACE inhibitor.   Likely start aldactone before discharge.  The patient understands that risks included but are not limited to stroke (1 in 1000), death (1 in 21), kidney failure [usually temporary] (1 in 500), bleeding (1 in 200), allergic reaction [possibly serious] (1 in 200).  The  patient understands and agrees to proceed.   CARDIOMYOPATHY:  Presumed ischemic.  Cath as above.   HTN:  BP OK.  Continue current therapy.   IDDM:   A1C 10.   Consult diabetes educator.   Jeneen Rinks [facility] 05/11/2014 10:06 AM

## 2014-05-11 NOTE — Progress Notes (Signed)
Inpatient Diabetes Program Recommendations  AACE/ADA: New Consensus Statement on Inpatient Glycemic Control (2013)  Target Ranges:  Prepandial:   less than 140 mg/dL      Peak postprandial:   less than 180 mg/dL (1-2 hours)      Critically ill patients:  140 - 180 mg/dL   Reason for Visit: Diabetes Consult  Results for MALIC, ROSTEN (MRN [id number]) as of 05/11/2014 13:07  Ref. Range 05/10/2014 11:08 05/10/2014 16:30 05/10/2014 21:02 05/11/2014 07:40 05/11/2014 11:38  Glucose-Capillary Latest Range: 70-99 mg/dL 316 (H) 337 (H) 272 (H) 186 (H) 153 (H)  Results for SHAKIM, FAITH (MRN [id number]) as of 05/11/2014 13:07  Ref. Range 05/10/2014 00:54  Hemoglobin A1C Latest Range: <5.7 % 10.0 (H)  Results for KHI, MCMILLEN (MRN [id number]) as of 05/11/2014 13:07  Ref. Range 05/09/2014 20:54 05/10/2014 00:54  Glucose Latest Range: 70-99 mg/dL 449 (H) 366 (H)    Inpatient Diabetes Program Recommendations   Insulin - Basal: Please consider increasing am dose of Lantus to 20 units daily.  Correction (SSI): Please order CBGs and Novolog moderate correction scale ACHS.  Will need PCP to manage DM at discharge. Care manager consult. Will order OP Diabetes Education consult for uncontrolled DM - HgbA1C of 10.0%.  Will continue to follow. Thank you. Lorenda Peck, RD, LDN, CDE Inpatient Diabetes Coordinator [phone]

## 2014-05-11 NOTE — Progress Notes (Signed)
    SUBJECTIVE:  Still having some chest pain.  No SOB.   PHYSICAL EXAM Filed Vitals:   05/10/14 0748 05/10/14 1205 05/10/14 2108 05/11/14 0635  BP: 139/88 134/89 140/93 133/85  Pulse: 101 103 101 102  Temp: 97.9 F (36.6 C) 98.2 F (36.8 C) 98.2 F (36.8 C) 98.4 F (36.9 C)  TempSrc: Oral Oral Oral Oral  Resp: 19 17    Height:      Weight:    201 lb (91.173 kg)  SpO2: 100% 100% 100% 98%   General:  No distress Lungs:  Clear Heart:  RRR Abdomen:  Positive bowel sounds, no rebound no guarding Extremities:  No edema   LABS: Lab Results  Component Value Date   TROPONINI <0.30 05/10/2014   Results for orders placed during the hospital encounter of 05/09/14 (from the past 24 hour(s))  GLUCOSE, CAPILLARY     Status: Abnormal   Collection Time    05/10/14 11:08 AM      Result Value Ref Range   Glucose-Capillary 316 (*) 70 - 99 mg/dL  GLUCOSE, CAPILLARY     Status: Abnormal   Collection Time    05/10/14  4:30 PM      Result Value Ref Range   Glucose-Capillary 337 (*) 70 - 99 mg/dL  GLUCOSE, CAPILLARY     Status: Abnormal   Collection Time    05/10/14  9:02 PM      Result Value Ref Range   Glucose-Capillary 272 (*) 70 - 99 mg/dL  GLUCOSE, CAPILLARY     Status: Abnormal   Collection Time    05/11/14  7:40 AM      Result Value Ref Range   Glucose-Capillary 186 (*) 70 - 99 mg/dL   Comment 1 Documented in Chart     Comment 2 Notify RN     No intake or output data in the 24 hours ending 05/11/14 1006   ASSESSMENT AND PLAN:  CHEST PAIN:  We sent a release of info to [facility] but have not gotten information back.    EF is severely reduced.   He will need a left heart cath.   I will start ACE inhibitor.   Likely start aldactone before discharge.  The patient understands that risks included but are not limited to stroke (1 in 1000), death (1 in 59), kidney failure [usually temporary] (1 in 500), bleeding (1 in 200), allergic reaction [possibly serious] (1 in 200).  The  patient understands and agrees to proceed.   CARDIOMYOPATHY:  Presumed ischemic.  Cath as above.   HTN:  BP OK.  Continue current therapy.   IDDM:   A1C 10.   Consult diabetes educator.   Jeneen Rinks [facility] 05/11/2014 10:06 AM

## 2014-05-11 NOTE — Progress Notes (Signed)
UR completed 

## 2014-05-12 ENCOUNTER — Encounter (HOSPITAL_COMMUNITY): Payer: Self-pay | Admitting: Nurse Practitioner

## 2014-05-12 DIAGNOSIS — I5022 Chronic systolic (congestive) heart failure: Secondary | ICD-10-CM | POA: Diagnosis present

## 2014-05-12 DIAGNOSIS — I9589 Other hypotension: Secondary | ICD-10-CM

## 2014-05-12 DIAGNOSIS — I200 Unstable angina: Secondary | ICD-10-CM

## 2014-05-12 DIAGNOSIS — I2510 Atherosclerotic heart disease of native coronary artery without angina pectoris: Secondary | ICD-10-CM | POA: Diagnosis present

## 2014-05-12 DIAGNOSIS — I519 Heart disease, unspecified: Secondary | ICD-10-CM

## 2014-05-12 DIAGNOSIS — E785 Hyperlipidemia, unspecified: Secondary | ICD-10-CM | POA: Diagnosis present

## 2014-05-12 DIAGNOSIS — I255 Ischemic cardiomyopathy: Secondary | ICD-10-CM | POA: Diagnosis present

## 2014-05-12 DIAGNOSIS — I25110 Atherosclerotic heart disease of native coronary artery with unstable angina pectoris: Principal | ICD-10-CM

## 2014-05-12 DIAGNOSIS — E119 Type 2 diabetes mellitus without complications: Secondary | ICD-10-CM

## 2014-05-12 DIAGNOSIS — I10 Essential (primary) hypertension: Secondary | ICD-10-CM

## 2014-05-12 LAB — BASIC METABOLIC PANEL
ANION GAP: 13 (ref 5–15)
BUN: 15 mg/dL (ref 6–23)
CO2: 21 mEq/L (ref 19–32)
Calcium: 8.8 mg/dL (ref 8.4–10.5)
Chloride: 104 mEq/L (ref 96–112)
Creatinine, Ser: 1.09 mg/dL (ref 0.50–1.35)
GFR, EST AFRICAN AMERICAN: 82 mL/min — AB (ref >90)
GFR, EST NON AFRICAN AMERICAN: 71 mL/min — AB (ref >90)
Glucose, Bld: 180 mg/dL — ABNORMAL HIGH (ref 70–99)
POTASSIUM: 3.8 meq/L (ref 3.7–5.3)
SODIUM: 138 meq/L (ref 137–147)

## 2014-05-12 LAB — GLUCOSE, CAPILLARY: GLUCOSE-CAPILLARY: 153 mg/dL — AB (ref 70–99)

## 2014-05-12 LAB — CBC
HCT: 38.2 % — ABNORMAL LOW (ref 39.0–52.0)
Hemoglobin: 13.7 g/dL (ref 13.0–17.0)
MCH: 28.1 pg (ref 26.0–34.0)
MCHC: 35.9 g/dL (ref 30.0–36.0)
MCV: 78.4 fL (ref 78.0–100.0)
PLATELETS: 182 10*3/uL (ref 150–400)
RBC: 4.87 MIL/uL (ref 4.22–5.81)
RDW: 13.1 % (ref 11.5–15.5)
WBC: 4.6 10*3/uL (ref 4.0–10.5)

## 2014-05-12 MED ORDER — CLOPIDOGREL BISULFATE 75 MG PO TABS: 75.0000 mg | ORAL_TABLET | Freq: Every day | ORAL | Status: DC

## 2014-05-12 MED ORDER — ATORVASTATIN CALCIUM 80 MG PO TABS: 80.0000 mg | ORAL_TABLET | Freq: Every day | ORAL | Status: DC

## 2014-05-12 MED ORDER — NITROGLYCERIN 0.4 MG SL SUBL: 0.4000 mg | SUBLINGUAL_TABLET | SUBLINGUAL | Status: DC | PRN

## 2014-05-12 MED ORDER — CARVEDILOL 25 MG PO TABS: 25.0000 mg | ORAL_TABLET | Freq: Two times a day (BID) | ORAL | Status: DC

## 2014-05-12 NOTE — Discharge Summary (Signed)
Discharge Summary   Patient ID: Antonio Guerra,  MRN: [id number], DOB/AGE: 08/01/1950 63 y.o.  Admit date: 05/09/2014 Discharge date: 05/12/2014  Primary Care Provider: No primary provider on file. Primary Cardiologist: J. Hochrein, MD   Discharge Diagnoses Principal Problem:   Unstable angina  **S/P PCI/DES to the mid LAD this admission.  Active Problems:   Ischemic cardiomyopathy/chronic systolic CHF  **EF 01-75% by echo this admission.   Coronary artery disease   Hypertension   Diabetes mellitus without complication   Hyperlipidemia  Allergies No Known Allergies  Procedures  2D Echocardiogram 10.22.2015  Study Conclusions  - Left ventricle: There is a false tendon int the mid LV cavity of   no clinical signficance. E/e&'>24 consistent with elevated LV   filling pressures. There is akinesis of the apical septum. The   cavity size was mildly dilated. Systolic function was severely   reduced. The estimated ejection fraction was in the range of 20%   to 25%. There is akinesis of the entireanteroseptal myocardium.   There is akinesis of the basal-midinferoseptal myocardium. There   is akinesis of the entireinferior myocardium. There is severe   hypokinesis of the apicalanterior myocardium. - Aortic valve: Moderate thickening and calcification, consistent   with sclerosis. - Mitral valve: Elongated anterior mitral valve leaftet with   calcified tip. There was mild regurgitation. - Pulmonic valve: There was trivial regurgitation. _____________   Cardiac Catheterization and Percutaneous Coronary Intervention 10.23.2015  Hemodynamic Findings: Central aortic pressure: 119/76 Left ventricular pressure: 119/12/16  Angiographic Findings:  Left main: Long segment with diffuse 10-20% stenosis.   Left Anterior Descending Artery: Large caliber vessel that courses to the apex. The proximal vessel has 40% stenosis. The mid vessel has a 60% stenosis followed by a patent mid  stented segment. There is diffuse restenosis with more focal 80% restenosis in the distal edge of the stented segment. Small distal diagonal branch. The LAD wraps around the apex. At the apex the vessel has a 50% stenosis.     **The mid LAD was successfully stented using a 3.5 x 38 mm Xience DES.**  Circumflex Artery: Moderate caliber vessel with diffuse 20% stenosis. Small caliber obtuse marginal branch. Large caliber ramus intermediate branch with 50% proximal stenosis, patent mid stent with diffuse 20% stent restenosis. There is a sub-branch of the intermediate that is small to moderate in caliber with diffuse proximal 40% stenosis.   Right Coronary Artery: Large dominant vessel with 40-50% mid stenosis. The distal vessel has diffuse 30% stenosis.  Left Ventricular Angiogram: Deferred.  _____________   History of Present Illness  63 year old male with prior history of coronary artery disease status post prior interventions in [place], [place], along with a history of ischemic cardiopathy and chronic systolic heart failure.  He now lives in the [place] area but has not had cardiology care recently. Over the past month, he's been experiencing intermittent chest discomfort, which has been exacerbated by stress and anxiety. On the morning of October 21, he developed recurrent chest pressure with exertion that resolved with rest. Given progression and worsening of symptoms, he presented to the [facility] October 21. There, ECG was nonacute while troponin was normal. He was admitted for further evaluation.  Hospital Course  Patient ruled out for myocardial infarction. Attempt was made to obtain records from [facility] in [place], [place] where his prior procedures were performed however, attempts were unsuccessful. 2-D echocardiography was performed revealing an EF of 20-25% with multiple wall motion  abnormalities as outlined above. Given progressive symptoms  and prior history of CAD and ischemic retinopathy, it was felt that he would require diagnostic cardiac catheterization. He underwent diagnostic catheterization October 23, revealing severe in-stent restenosis within the mid LAD and otherwise nonobstructive coronary artery disease. The LAD was successfully treated using a 3.5 x 38 mm Xience drug-eluting stent.  He tolerated procedure well and post procedure has been doing without recurrent symptoms or limitations.  With regards to his ischemic cardiopathy and history of chronic systolic congestive heart failure, patient was placed on carvedilol therapy upon admission. He was tachycardic initially and carvedilol was titrated to 25 mg twice a day. Post procedure, his blood pressures were soft however he has been ambulating without symptoms or limitations and tolerated his beta blocker this morning. We will continue carvedilol at its current dose of 25 mg twice a day. He is not currently on ACE inhibitor, ARB, or ARNI therapy and we will look to add this as an outpatient as tolerated. He will be discharged home today in good condition and we will arrange for followup within the next week.  Discharge Vitals Blood pressure 109/70, pulse 93, temperature 97.7 F (36.5 C), temperature source Oral, resp. rate 18, height 6\' 1"  (1.854 m), weight 203 lb 0.7 oz (92.1 kg), SpO2 99.00%.  Filed Weights   05/09/14 2356 05/11/14 0635 05/11/14 2335  Weight: 199 lb 1.6 oz (90.311 kg) 201 lb (91.173 kg) 203 lb 0.7 oz (92.1 kg)   Labs  CBC  Recent Labs  05/10/14 0054 05/12/14 0307  WBC 6.8 4.6  HGB 13.1 13.7  HCT 37.4* 38.2*  MCV 77.8* 78.4  PLT 193 951   Basic Metabolic Panel  Recent Labs  05/09/14 2054 05/10/14 0054 05/12/14 0307  NA 136* 138 138  K 4.3 4.2 3.8  CL 95* 100 104  CO2 24 24 21   GLUCOSE 449* 366* 180*  BUN 16 15 15   CREATININE 1.17 1.10 1.09  CALCIUM 9.8 9.7 8.8  MG  --  2.0  --    Cardiac Enzymes  Recent Labs  05/10/14 0008  05/10/14 0335 05/10/14 0505  TROPONINI <0.30 <0.30 <0.30   Hemoglobin A1C  Recent Labs  05/10/14 0054  HGBA1C 10.0*   Disposition  Pt is being discharged home today in good condition.  Follow-up Plans & Appointments  Follow-up Information   Follow up with Minus Breeding, MD In 1 week. (We will arrange and contact you.)    Specialty:  Cardiology   Contact information:   [address] [place] [address] [phone]       Follow up with Primary Care Provider. (Please obtain and arrange for primary care follow-up as instructed by the case manager.)      Discharge Medications    Medication List         aspirin EC 81 MG tablet  Take 81 mg by mouth daily.     atorvastatin 80 MG tablet  Commonly known as:  LIPITOR  Take 1 tablet (80 mg total) by mouth daily at 6 PM.     carvedilol 25 MG tablet  Commonly known as:  COREG  Take 1 tablet (25 mg total) by mouth 2 (two) times daily with a meal.     clopidogrel 75 MG tablet  Commonly known as:  PLAVIX  Take 1 tablet (75 mg total) by mouth daily with breakfast.     furosemide 20 MG tablet  Commonly known as:  LASIX  Take  20 mg by mouth daily.     insulin glargine 100 UNIT/ML injection  Commonly known as:  LANTUS  Inject 16-22 Units into the skin 2 (two) times daily. Take 16 units every morning and 22 units at bedtime.     nitroGLYCERIN 0.4 MG SL tablet  Commonly known as:  NITROSTAT  Place 1 tablet (0.4 mg total) under the tongue every 5 (five) minutes as needed for chest pain.       Outstanding Labs/Studies  F/U lipids/lft's in 8 wks.  Duration of Discharge Encounter   Greater than 30 minutes including physician time.  Signed, Murray Hodgkins NP 05/12/2014, 11:27 AM

## 2014-05-12 NOTE — Discharge Instructions (Signed)

## 2014-05-12 NOTE — Progress Notes (Signed)
SUBJECTIVE: Pt feels well and specifically denies chest pain, SOB, dizziness/lightheadedness, palpitations, and leg swelling. Only complaint is "feet burning". Has diabetes. SBP 87 mmHg, previously in 120-130 mmHg range.     Intake/Output Summary (Last 24 hours) at 05/12/14 0934 Last data filed at 05/12/14 0847  Gross per 24 hour  Intake 1077.5 ml  Output    875 ml  Net  202.5 ml    Current Facility-Administered Medications  Medication Dose Route Frequency Provider Last Rate Last Dose  . acetaminophen (TYLENOL) tablet 650 mg  650 mg Oral Q4H PRN Raliegh Ip, MD   650 mg at 05/11/14 2124  . aspirin EC tablet 81 mg  81 mg Oral Daily Raliegh Ip, MD   81 mg at 05/11/14 1024  . atorvastatin (LIPITOR) tablet 80 mg  80 mg Oral q1800 Raliegh Ip, MD   80 mg at 05/11/14 1807  . carvedilol (COREG) tablet 25 mg  25 mg Oral BID WC Almyra Deforest, PA   25 mg at 05/12/14 0848  . clopidogrel (PLAVIX) tablet 75 mg  75 mg Oral Q breakfast Burnell Blanks, MD   75 mg at 05/12/14 0848  . gabapentin (NEURONTIN) capsule 300 mg  300 mg Oral BID Minus Breeding, MD   300 mg at 05/11/14 2113  . insulin glargine (LANTUS) injection 16 Units  16 Units Subcutaneous Daily Darlin Coco, MD   16 Units at 05/11/14 1025   And  . insulin glargine (LANTUS) injection 22 Units  22 Units Subcutaneous QHS Darlin Coco, MD   22 Units at 05/11/14 2113  . ondansetron (ZOFRAN) injection 4 mg  4 mg Intravenous Q6H PRN Raliegh Ip, MD        Filed Vitals:   05/11/14 2125 05/11/14 2335 05/12/14 0409 05/12/14 0743  BP:  133/86 124/80 109/70  Pulse: 98 98 95 93  Temp:  99 F (37.2 C) 97.6 F (36.4 C) 97.7 F (36.5 C)  TempSrc:  Oral Oral Oral  Resp:  18 18 18   Height:      Weight:  203 lb 0.7 oz (92.1 kg)    SpO2: 97% 99% 99% 99%    PHYSICAL EXAM General: NAD HEENT: Normal. Neck: No JVD, no thyromegaly.  Lungs: Clear to auscultation bilaterally with normal respiratory effort. CV: Nondisplaced  PMI.  Regular rate and rhythm, normal S1/S2, no S3/S4, no murmur.  No pretibial edema.  No carotid bruit.  Normal pedal pulses.  Abdomen: Soft, nontender, no hepatosplenomegaly, no distention.  Neurologic: Alert and oriented x 3.  Psych: Normal affect. Musculoskeletal: Normal range of motion. No gross deformities. Extremities: No clubbing or cyanosis.   TELEMETRY: Reviewed telemetry pt in sinus rhythm.  LABS: Basic Metabolic Panel:  Recent Labs  05/09/14 2054 05/10/14 0054 05/12/14 0307  NA 136* 138 138  K 4.3 4.2 3.8  CL 95* 100 104  CO2 24 24 21   GLUCOSE 449* 366* 180*  BUN 16 15 15   CREATININE 1.17 1.10 1.09  CALCIUM 9.8 9.7 8.8  MG  --  2.0  --    Liver Function Tests: No results found for this basename: AST, ALT, ALKPHOS, BILITOT, PROT, ALBUMIN,  in the last 72 hours No results found for this basename: LIPASE, AMYLASE,  in the last 72 hours CBC:  Recent Labs  05/10/14 0054 05/12/14 0307  WBC 6.8 4.6  HGB 13.1 13.7  HCT 37.4* 38.2*  MCV 77.8* 78.4  PLT 193 182  Cardiac Enzymes:  Recent Labs  05/10/14 0008 05/10/14 0335 05/10/14 0505  TROPONINI <0.30 <0.30 <0.30   BNP: No components found with this basename: POCBNP,  D-Dimer: No results found for this basename: DDIMER,  in the last 72 hours Hemoglobin A1C:  Recent Labs  05/10/14 0054  HGBA1C 10.0*   Fasting Lipid Panel: No results found for this basename: CHOL, HDL, LDLCALC, TRIG, CHOLHDL, LDLDIRECT,  in the last 72 hours Thyroid Function Tests: No results found for this basename: TSH, T4TOTAL, FREET3, T3FREE, THYROIDAB,  in the last 72 hours Anemia Panel: No results found for this basename: VITAMINB12, FOLATE, FERRITIN, TIBC, IRON, RETICCTPCT,  in the last 72 hours  RADIOLOGY: Dg Chest 2 View  05/09/2014   CLINICAL DATA:  63 year old male with acute chest pain and shortness of Breath. Initial encounter.  EXAM: CHEST  2 VIEW  COMPARISON:  None.  FINDINGS: Normal lung volumes. Normal cardiac  size and mediastinal contours. Visualized tracheal air column is within normal limits. The lungs are clear. No pneumothorax or effusion. No osseous abnormality identified.  IMPRESSION: Negative, no acute cardiopulmonary abnormality.   Electronically Signed   By: Lars Pinks M.D.   On: 05/09/2014 21:23      ASSESSMENT AND PLAN: 1. Unstable angina s/p PTCA/DES of mid-LAD (instent restenosis) with moderate residual disease in LCx and RCA: Symptomatically stable. Hypotensive without symptoms of dizziness/lightheadedness. Nurse plans to have him ambulate and see how he does. Currently on ASA, Lipitor 80 mg, Coreg 25 mg bid, and Plavix. HR in low 90 bpm range. If BP remains low, would consider switching Coreg to Toprol-XL. 2. Essential HTN: Hypotensive as noted above on Coreg 25 mg bid. Will ambulate and see how he does. 3. Ischemic cardiomyopathy/severe LV dysfunction, EF 20-25%: On Coreg 25 mg bid. On Lasix 20 mg daily as outpatient, which I would continue. Given hypotension this morning, will not initiate ACEI and/or spironolactone. 4. IDDM: Currently on insulin. Uncontrolled with HbA1C of 10% on 10/22. Will need medication adjustments as outpatient with PCP.  Dispo: After ambulating will see if BP improves and patient remains symptom-free. If so, will plan on discharging today.  Kate Sable, M.D., F.A.C.C.

## 2014-05-12 NOTE — Progress Notes (Signed)
CARDIAC REHAB PHASE I   PRE:  Rate/Rhythm: 96 SR  BP:  Supine:   Sitting: 87/66 87/53 110/74 Standing:    SaO2: 99% RA  MODE:  Ambulation: 600+ ft   POST:  Rate/Rhythm: 101 ST  BP:  Supine:   Sitting: 98/73  Standing:    SaO2: 100% RA   [id number]- Patient's blood pressure prior to ambulation was low at 87/66, 87/53, patient was asymptomatic, had patient hydrate and recheck BP was 110/74. Patient tolerated ambulation well with assist x1 and pushing a rolling walker, multiple rest breaks taken. BP after ambulation was 98/73. PCI education completed with patient including restrictions, risk factor modification, Plavix use, CP, NTG use, and calling 911, heart healthy and diabetic diet sheet given, and patient instructed on activity progression. Pt verbalizes understanding of instructions given. Discussed Phase 2 cardiac rehab with patient, and patient is interested if he's able to arrange transportation. Permission given to send contact info the CR at [facility].   Sol Passer, MS, ACSM CCEP

## 2014-05-13 NOTE — Progress Notes (Signed)
CARE MANAGEMENT NOTE 05/13/2014  Patient:  Antonio Guerra, Antonio Guerra   Account Number:  [id number]  Date Initiated:  05/12/2014  Documentation initiated by:  [facility]  Subjective/Objective Assessment:   adm: Unstable angina     Action/Plan:   discharge planning   Anticipated DC Date:  05/12/2014   Anticipated DC Plan:  [place]  CM consult      Choice offered to / List presented to:             Status of service:  Completed, signed off Medicare Important Message given?   (If response is "NO", the following Medicare IM given date fields will be blank) Date Medicare IM given:   Medicare IM given by:   Date Additional Medicare IM given:   Additional Medicare IM given by:    Discharge Disposition:  HOME/SELF CARE  Per UR Regulation:    If discussed at Long Length of Stay Meetings, dates discussed:    Comments:  05/12/14 11:00 CM gave pt PCP resource number (handout). Pt verbalized understanding to call on Monday after 09:00 am to secure a PCP.  No other CM needs were communicated. Mariane Masters, BSN, [place].

## 2014-05-21 ENCOUNTER — Ambulatory Visit: Payer: Medicaid Other | Admitting: Cardiology

## 2014-05-29 ENCOUNTER — Telehealth: Payer: Self-pay | Admitting: Cardiology

## 2014-05-29 NOTE — Telephone Encounter (Signed)
Spoke to Bruna Potter stated she refaxed order -medicaid cardiac rehab to be complete RN received and gave form to [facility]  LPN- for Dr Percival Spanish to sign

## 2014-06-21 ENCOUNTER — Encounter: Payer: Self-pay | Admitting: Cardiology

## 2014-06-28 ENCOUNTER — Encounter (HOSPITAL_COMMUNITY): Payer: Self-pay | Admitting: Cardiovascular Disease

## 2014-07-02 ENCOUNTER — Encounter: Payer: Self-pay | Admitting: Cardiology

## 2015-09-27 ENCOUNTER — Emergency Department (HOSPITAL_COMMUNITY): Payer: Medicaid Other

## 2015-09-27 ENCOUNTER — Encounter (HOSPITAL_COMMUNITY): Payer: Self-pay | Admitting: Emergency Medicine

## 2015-09-27 ENCOUNTER — Inpatient Hospital Stay (HOSPITAL_COMMUNITY)
Admission: EM | Admit: 2015-09-27 | Discharge: 2015-10-02 | DRG: 286 | Disposition: A | Discharge status: Home / Self Care | Payer: Medicaid Other | Attending: Cardiology | Admitting: Cardiology

## 2015-09-27 DIAGNOSIS — Z7982 Long term (current) use of aspirin: Secondary | ICD-10-CM

## 2015-09-27 DIAGNOSIS — Z8679 Personal history of other diseases of the circulatory system: Secondary | ICD-10-CM

## 2015-09-27 DIAGNOSIS — I160 Hypertensive urgency: Secondary | ICD-10-CM | POA: Diagnosis present

## 2015-09-27 DIAGNOSIS — Z79899 Other long term (current) drug therapy: Secondary | ICD-10-CM | POA: Diagnosis not present

## 2015-09-27 DIAGNOSIS — R0600 Dyspnea, unspecified: Secondary | ICD-10-CM

## 2015-09-27 DIAGNOSIS — I200 Unstable angina: Secondary | ICD-10-CM

## 2015-09-27 DIAGNOSIS — N179 Acute kidney failure, unspecified: Secondary | ICD-10-CM | POA: Diagnosis present

## 2015-09-27 DIAGNOSIS — R7989 Other specified abnormal findings of blood chemistry: Secondary | ICD-10-CM

## 2015-09-27 DIAGNOSIS — I255 Ischemic cardiomyopathy: Secondary | ICD-10-CM | POA: Diagnosis present

## 2015-09-27 DIAGNOSIS — I25110 Atherosclerotic heart disease of native coronary artery with unstable angina pectoris: Secondary | ICD-10-CM | POA: Diagnosis present

## 2015-09-27 DIAGNOSIS — E118 Type 2 diabetes mellitus with unspecified complications: Secondary | ICD-10-CM | POA: Diagnosis not present

## 2015-09-27 DIAGNOSIS — Z7902 Long term (current) use of antithrombotics/antiplatelets: Secondary | ICD-10-CM | POA: Diagnosis not present

## 2015-09-27 DIAGNOSIS — I2510 Atherosclerotic heart disease of native coronary artery without angina pectoris: Secondary | ICD-10-CM | POA: Diagnosis not present

## 2015-09-27 DIAGNOSIS — I110 Hypertensive heart disease with heart failure: Secondary | ICD-10-CM | POA: Diagnosis present

## 2015-09-27 DIAGNOSIS — F1290 Cannabis use, unspecified, uncomplicated: Secondary | ICD-10-CM | POA: Diagnosis present

## 2015-09-27 DIAGNOSIS — E119 Type 2 diabetes mellitus without complications: Secondary | ICD-10-CM

## 2015-09-27 DIAGNOSIS — I248 Other forms of acute ischemic heart disease: Secondary | ICD-10-CM | POA: Diagnosis present

## 2015-09-27 DIAGNOSIS — Z23 Encounter for immunization: Secondary | ICD-10-CM | POA: Diagnosis not present

## 2015-09-27 DIAGNOSIS — Z9114 Patient's other noncompliance with medication regimen: Secondary | ICD-10-CM | POA: Diagnosis not present

## 2015-09-27 DIAGNOSIS — E785 Hyperlipidemia, unspecified: Secondary | ICD-10-CM | POA: Diagnosis present

## 2015-09-27 DIAGNOSIS — Z955 Presence of coronary angioplasty implant and graft: Secondary | ICD-10-CM

## 2015-09-27 DIAGNOSIS — I5043 Acute on chronic combined systolic (congestive) and diastolic (congestive) heart failure: Secondary | ICD-10-CM | POA: Diagnosis present

## 2015-09-27 DIAGNOSIS — Z8673 Personal history of transient ischemic attack (TIA), and cerebral infarction without residual deficits: Secondary | ICD-10-CM

## 2015-09-27 DIAGNOSIS — Z794 Long term (current) use of insulin: Secondary | ICD-10-CM | POA: Diagnosis not present

## 2015-09-27 DIAGNOSIS — E1165 Type 2 diabetes mellitus with hyperglycemia: Secondary | ICD-10-CM | POA: Diagnosis present

## 2015-09-27 DIAGNOSIS — R000 Tachycardia, unspecified: Secondary | ICD-10-CM | POA: Diagnosis present

## 2015-09-27 DIAGNOSIS — R072 Precordial pain: Secondary | ICD-10-CM

## 2015-09-27 DIAGNOSIS — I10 Essential (primary) hypertension: Secondary | ICD-10-CM

## 2015-09-27 DIAGNOSIS — R778 Other specified abnormalities of plasma proteins: Secondary | ICD-10-CM | POA: Insufficient documentation

## 2015-09-27 HISTORY — DX: Hypertensive urgency: I16.0

## 2015-09-27 LAB — URINALYSIS, ROUTINE W REFLEX MICROSCOPIC
Bilirubin Urine: NEGATIVE
GLUCOSE, UA: 250 mg/dL — AB
KETONES UR: NEGATIVE mg/dL
Leukocytes, UA: NEGATIVE
NITRITE: NEGATIVE
PH: 5 (ref 5.0–8.0)
Protein, ur: 100 mg/dL — AB
Specific Gravity, Urine: 1.021 (ref 1.005–1.030)

## 2015-09-27 LAB — GLUCOSE, CAPILLARY: GLUCOSE-CAPILLARY: 254 mg/dL — AB (ref 65–99)

## 2015-09-27 LAB — BASIC METABOLIC PANEL
Anion gap: 9 (ref 5–15)
BUN: 16 mg/dL (ref 6–20)
CO2: 23 mmol/L (ref 22–32)
CREATININE: 1.31 mg/dL — AB (ref 0.61–1.24)
Calcium: 9.5 mg/dL (ref 8.9–10.3)
Chloride: 106 mmol/L (ref 101–111)
GFR calc Af Amer: >60 mL/min (ref >60)
GFR calc non Af Amer: 56 mL/min — ABNORMAL LOW (ref >60)
GLUCOSE: 224 mg/dL — AB (ref 65–99)
Potassium: 4.3 mmol/L (ref 3.5–5.1)
SODIUM: 138 mmol/L (ref 135–145)

## 2015-09-27 LAB — MRSA PCR SCREENING: MRSA by PCR: NEGATIVE

## 2015-09-27 LAB — PROTIME-INR
INR: 0.98 (ref 0.00–1.49)
PROTHROMBIN TIME: 13.2 s (ref 11.6–15.2)

## 2015-09-27 LAB — URINE MICROSCOPIC-ADD ON
Bacteria, UA: NONE SEEN
RBC / HPF: NONE SEEN RBC/hpf (ref 0–5)
SQUAMOUS EPITHELIAL / LPF: NONE SEEN
WBC UA: NONE SEEN WBC/hpf (ref 0–5)

## 2015-09-27 LAB — RAPID URINE DRUG SCREEN, HOSP PERFORMED
AMPHETAMINES: NOT DETECTED
BARBITURATES: NOT DETECTED
Benzodiazepines: NOT DETECTED
Cocaine: NOT DETECTED
Opiates: NOT DETECTED
Tetrahydrocannabinol: POSITIVE — AB

## 2015-09-27 LAB — CBG MONITORING, ED
Glucose-Capillary: 194 mg/dL — ABNORMAL HIGH (ref 65–99)
Glucose-Capillary: 268 mg/dL — ABNORMAL HIGH (ref 65–99)

## 2015-09-27 LAB — CBC
HCT: 41.4 % (ref 39.0–52.0)
Hemoglobin: 14.2 g/dL (ref 13.0–17.0)
MCH: 27.5 pg (ref 26.0–34.0)
MCHC: 34.3 g/dL (ref 30.0–36.0)
MCV: 80.2 fL (ref 78.0–100.0)
Platelets: 223 10*3/uL (ref 150–400)
RBC: 5.16 MIL/uL (ref 4.22–5.81)
RDW: 14 % (ref 11.5–15.5)
WBC: 5.5 10*3/uL (ref 4.0–10.5)

## 2015-09-27 LAB — I-STAT TROPONIN, ED: Troponin i, poc: 0.04 ng/mL (ref 0.00–0.08)

## 2015-09-27 LAB — HEPATIC FUNCTION PANEL
ALBUMIN: 3.6 g/dL (ref 3.5–5.0)
ALK PHOS: 85 U/L (ref 38–126)
ALT: 24 U/L (ref 17–63)
AST: 26 U/L (ref 15–41)
BILIRUBIN DIRECT: 0.2 mg/dL (ref 0.1–0.5)
BILIRUBIN TOTAL: 0.9 mg/dL (ref 0.3–1.2)
Indirect Bilirubin: 0.7 mg/dL (ref 0.3–0.9)
Total Protein: 6.9 g/dL (ref 6.5–8.1)

## 2015-09-27 LAB — TROPONIN I: Troponin I: 0.05 ng/mL — ABNORMAL HIGH (ref <0.031)

## 2015-09-27 LAB — BRAIN NATRIURETIC PEPTIDE: B Natriuretic Peptide: 388.9 pg/mL — ABNORMAL HIGH (ref 0.0–100.0)

## 2015-09-27 LAB — TSH: TSH: 4.454 u[IU]/mL (ref 0.350–4.500)

## 2015-09-27 MED ORDER — ASPIRIN EC 81 MG PO TBEC
81.0000 mg | DELAYED_RELEASE_TABLET | Freq: Every day | ORAL | Status: DC
  Administered 2015-09-28 – 2015-10-02 (×4): 81 mg via ORAL
  Filled 2015-09-27 (×5): qty 1

## 2015-09-27 MED ORDER — FUROSEMIDE 20 MG PO TABS
20.0000 mg | ORAL_TABLET | Freq: Once | ORAL | Status: AC
  Administered 2015-09-27: 20 mg via ORAL
  Filled 2015-09-27: qty 1

## 2015-09-27 MED ORDER — ATORVASTATIN CALCIUM 80 MG PO TABS
80.0000 mg | ORAL_TABLET | Freq: Every day | ORAL | Status: DC
  Administered 2015-09-28 – 2015-10-01 (×5): 80 mg via ORAL
  Filled 2015-09-27 (×5): qty 1

## 2015-09-27 MED ORDER — CARVEDILOL 25 MG PO TABS
25.0000 mg | ORAL_TABLET | Freq: Two times a day (BID) | ORAL | Status: DC
  Administered 2015-09-28 – 2015-10-02 (×9): 25 mg via ORAL
  Filled 2015-09-27 (×9): qty 1

## 2015-09-27 MED ORDER — NITROGLYCERIN 2 % TD OINT
1.0000 [in_us] | TOPICAL_OINTMENT | Freq: Once | TRANSDERMAL | Status: AC
  Administered 2015-09-27: 1 [in_us] via TOPICAL
  Filled 2015-09-27: qty 1

## 2015-09-27 MED ORDER — HEPARIN (PORCINE) IN NACL 100-0.45 UNIT/ML-% IJ SOLN
1050.0000 [IU]/h | INTRAMUSCULAR | Status: DC
  Administered 2015-09-27: 1250 [IU]/h via INTRAVENOUS
  Filled 2015-09-27 (×3): qty 250

## 2015-09-27 MED ORDER — HEPARIN BOLUS VIA INFUSION
4000.0000 [IU] | Freq: Once | INTRAVENOUS | Status: AC
  Administered 2015-09-27: 4000 [IU] via INTRAVENOUS
  Filled 2015-09-27: qty 4000

## 2015-09-27 MED ORDER — NITROGLYCERIN IN D5W 200-5 MCG/ML-% IV SOLN
3.0000 ug/min | INTRAVENOUS | Status: DC
  Administered 2015-09-27: 3 ug/min via INTRAVENOUS
  Filled 2015-09-27: qty 250

## 2015-09-27 MED ORDER — INSULIN ASPART 100 UNIT/ML ~~LOC~~ SOLN
0.0000 [IU] | Freq: Three times a day (TID) | SUBCUTANEOUS | Status: DC
  Administered 2015-09-27: 2 [IU] via SUBCUTANEOUS
  Administered 2015-09-28: 5 [IU] via SUBCUTANEOUS
  Administered 2015-09-28: 3 [IU] via SUBCUTANEOUS
  Administered 2015-09-28: 1 [IU] via SUBCUTANEOUS
  Administered 2015-09-29: 5 [IU] via SUBCUTANEOUS
  Administered 2015-09-29: 2 [IU] via SUBCUTANEOUS
  Administered 2015-09-29: 5 [IU] via SUBCUTANEOUS
  Administered 2015-09-30 – 2015-10-01 (×4): 3 [IU] via SUBCUTANEOUS
  Administered 2015-10-01: 5 [IU] via SUBCUTANEOUS
  Administered 2015-10-01: 3 [IU] via SUBCUTANEOUS
  Administered 2015-10-02: 7 [IU] via SUBCUTANEOUS
  Administered 2015-10-02: 3 [IU] via SUBCUTANEOUS

## 2015-09-27 MED ORDER — ASPIRIN EC 81 MG PO TBEC
81.0000 mg | DELAYED_RELEASE_TABLET | Freq: Once | ORAL | Status: AC
  Administered 2015-09-27: 81 mg via ORAL
  Filled 2015-09-27: qty 1

## 2015-09-27 MED ORDER — SODIUM CHLORIDE 0.9 % IV BOLUS (SEPSIS)
500.0000 mL | Freq: Once | INTRAVENOUS | Status: AC
  Administered 2015-09-27: 500 mL via INTRAVENOUS

## 2015-09-27 MED ORDER — ACETAMINOPHEN 325 MG PO TABS
650.0000 mg | ORAL_TABLET | ORAL | Status: DC | PRN
  Administered 2015-10-01 (×2): 650 mg via ORAL
  Filled 2015-09-27 (×2): qty 2

## 2015-09-27 MED ORDER — ONDANSETRON HCL 4 MG/2ML IJ SOLN
4.0000 mg | Freq: Four times a day (QID) | INTRAMUSCULAR | Status: DC | PRN
  Filled 2015-09-27: qty 2

## 2015-09-27 MED ORDER — CARVEDILOL 12.5 MG PO TABS
25.0000 mg | ORAL_TABLET | Freq: Once | ORAL | Status: AC
  Administered 2015-09-27: 25 mg via ORAL
  Filled 2015-09-27: qty 2

## 2015-09-27 MED ORDER — INFLUENZA VAC SPLIT QUAD 0.5 ML IM SUSY
0.5000 mL | PREFILLED_SYRINGE | INTRAMUSCULAR | Status: AC
  Administered 2015-09-28: 0.5 mL via INTRAMUSCULAR
  Filled 2015-09-27: qty 0.5

## 2015-09-27 MED ORDER — FOLIC ACID 1 MG PO TABS
1.0000 mg | ORAL_TABLET | Freq: Every day | ORAL | Status: DC
  Administered 2015-09-28 – 2015-10-02 (×5): 1 mg via ORAL
  Filled 2015-09-27 (×5): qty 1

## 2015-09-27 MED ORDER — FUROSEMIDE 10 MG/ML IJ SOLN
40.0000 mg | Freq: Two times a day (BID) | INTRAMUSCULAR | Status: DC
  Administered 2015-09-27 – 2015-10-02 (×9): 40 mg via INTRAVENOUS
  Filled 2015-09-27 (×9): qty 4

## 2015-09-27 MED ORDER — INSULIN ASPART 100 UNIT/ML ~~LOC~~ SOLN: 6.0000 [IU] | Freq: Once | SUBCUTANEOUS | Status: DC

## 2015-09-27 NOTE — ED Provider Notes (Addendum)
CSN: [id number]     Arrival date & time 09/27/15  1031 History   First MD Initiated Contact with Patient 09/27/15 1120     Chief Complaint  Patient presents with  . Shortness of Breath  . Chest Pain     (Consider location/radiation/quality/duration/timing/severity/associated sxs/prior Treatment) Patient is a 66 y.o. male presenting with shortness of breath and chest pain. The history is provided by the patient.  Shortness of Breath Associated symptoms: chest pain and cough   Associated symptoms: no abdominal pain, no fever, no headaches, no neck pain, no rash, no sore throat and no vomiting   Chest Pain Associated symptoms: cough and shortness of breath   Associated symptoms: no abdominal pain, no back pain, no fever, no headache and not vomiting   Patient w hx cad, stent, ischemic cardiomyopathy w ef 25%, c/o increased sob and episodic chest pain in the past week at rest. Chest pain centrally located, dull to sharp in nature, at rest, lasts seconds to several minutes. +sob. No nv. No diaphoresis. No pleuritic pain. No leg pain or swelling. States has been out of all of his medications for 1 week. +polyuria. No nv. No diarrhea. No abd pain. No dysuria. Occasional non prod cough. No sore throat or other uri symptoms. No fever or chills. Orthopnea no pnd.       Past Medical History  Diagnosis Date  . Coronary artery disease     a. 2011 MI x 2 with PCI: stent x 2 (LAD and RI) @ [facility] in [place], [place];  b. 04/2014 Cath/PCI: LM 10-20, LAD 40p, 48m, 76m ISR(3.5x38 Xience DES), 50apical, LCX 20 diffuse, RI 50p, 35m ISR, RCA 40-74m, 30d.  . Diabetes mellitus without complication ([place])   . Hypertension   . Stroke [facility])     a. 2012  . Hyperlipidemia   . Ischemic cardiomyopathy     a. 04/2014 Echo: EF 20-25%, anteroseptal, inferior, & basal-mid inferoseptal AK. Sev apical/ant HK, Ao sclerosis, mild MR, triv PI.  Marland Kitchen Chronic systolic congestive heart failure ([place])     a. 04/2014 Echo: EF  20-25%.   Past Surgical History  Procedure Laterality Date  . Ankle surgery    . Coronary stent placement    . Left heart catheterization with coronary/graft angiogram N/A 05/11/2014    Procedure: LEFT HEART CATHETERIZATION WITH Beatrix Fetters;  Surgeon: Burnell Blanks, MD;  Location: [facility] CATH LAB;  Service: Cardiovascular;  Laterality: N/A;  . Percutaneous coronary stent intervention (pci-s)  05/11/2014    Procedure: PERCUTANEOUS CORONARY STENT INTERVENTION (PCI-S);  Surgeon: Burnell Blanks, MD;  Location: [facility] CATH LAB;  Service: Cardiovascular;;  . Fractional flow reserve wire  05/11/2014    Procedure: FRACTIONAL FLOW RESERVE WIRE;  Surgeon: Burnell Blanks, MD;  Location: [facility] CATH LAB;  Service: Cardiovascular;;   Family History  Problem Relation Age of Onset  . Diabetes Mother   . CAD Neg Hx    Social History  Substance Use Topics  . Smoking status: Never Smoker   . Smokeless tobacco: Never Used  . Alcohol Use: No    Review of Systems  Constitutional: Negative for fever.  HENT: Negative for sore throat.   Eyes: Negative for redness.  Respiratory: Positive for cough and shortness of breath.   Cardiovascular: Positive for chest pain. Negative for leg swelling.  Gastrointestinal: Negative for vomiting, abdominal pain and diarrhea.  Endocrine: Positive for polyuria.  Genitourinary: Negative for dysuria and flank pain.  Musculoskeletal: Negative for back  pain and neck pain.  Skin: Negative for rash.  Neurological: Negative for headaches.  Hematological: Does not bruise/bleed easily.  Psychiatric/Behavioral: Negative for confusion.      Allergies  Review of patient's allergies indicates no known allergies.  Home Medications   Prior to Admission medications   Medication Sig Start Date End Date Taking? Authorizing Provider  aspirin EC 81 MG tablet Take 81 mg by mouth daily.    Historical Provider, MD  atorvastatin (LIPITOR) 80 MG tablet  Take 1 tablet (80 mg total) by mouth daily at 6 PM. 05/12/14   Brett Canales, PA-C  carvedilol (COREG) 25 MG tablet Take 1 tablet (25 mg total) by mouth 2 (two) times daily with a meal. 05/12/14   Brett Canales, PA-C  clopidogrel (PLAVIX) 75 MG tablet Take 1 tablet (75 mg total) by mouth daily with breakfast. 05/12/14   Brett Canales, PA-C  furosemide (LASIX) 20 MG tablet Take 20 mg by mouth daily.    Historical Provider, MD  insulin glargine (LANTUS) 100 UNIT/ML injection Inject 16-22 Units into the skin 2 (two) times daily. Take 16 units every morning and 22 units at bedtime.    Historical Provider, MD  nitroGLYCERIN (NITROSTAT) 0.4 MG SL tablet Place 1 tablet (0.4 mg total) under the tongue every 5 (five) minutes as needed for chest pain. 05/12/14   Brett Canales, PA-C   BP 176/118 mmHg  Pulse 126  Temp(Src) 97.9 F (36.6 C) (Oral)  Resp 25  Ht 6\' 1"  (1.854 m)  Wt 92.194 kg  BMI 26.82 kg/m2  SpO2 99% Physical Exam  Constitutional: He is oriented to person, place, and time. He appears well-developed and well-nourished. No distress.  Hypertensive, tachycardic.   HENT:  Head: Atraumatic.  Mouth/Throat: Oropharynx is clear and moist.  Eyes: Conjunctivae are normal. Pupils are equal, round, and reactive to light. No scleral icterus.  Neck: Neck supple. No tracheal deviation present. No thyromegaly present.  Cardiovascular: Regular rhythm, normal heart sounds and intact distal pulses.  Exam reveals no gallop and no friction rub.   No murmur heard. Pulmonary/Chest: Effort normal and breath sounds normal. No accessory muscle usage. No respiratory distress.  Abdominal: Soft. Bowel sounds are normal. He exhibits no distension and no mass. There is no tenderness. There is no rebound and no guarding.  Genitourinary:  No cva tenderness  Musculoskeletal: Normal range of motion. He exhibits no edema or tenderness.  Neurological: He is alert and oriented to person, place, and time.  Skin: Skin is  warm and dry. No rash noted. He is not diaphoretic.  Psychiatric: He has a normal mood and affect.  Nursing note and vitals reviewed.   ED Course  Procedures (including critical care time) Labs Review  Results for orders placed or performed during the hospital encounter of [id number]  Basic metabolic panel  Result Value Ref Range   Sodium 138 135 - 145 mmol/L   Potassium 4.3 3.5 - 5.1 mmol/L   Chloride 106 101 - 111 mmol/L   CO2 23 22 - 32 mmol/L   Glucose, Bld 224 (H) 65 - 99 mg/dL   BUN 16 6 - 20 mg/dL   Creatinine, Ser 1.31 (H) 0.61 - 1.24 mg/dL   Calcium 9.5 8.9 - 10.3 mg/dL   GFR calc non Af Amer 56 (L) >60 mL/min   GFR calc Af Amer >60 >60 mL/min   Anion gap 9 5 - 15  CBC  Result Value Ref Range   WBC  5.5 4.0 - 10.5 K/uL   RBC 5.16 4.22 - 5.81 MIL/uL   Hemoglobin 14.2 13.0 - 17.0 g/dL   HCT 41.4 39.0 - 52.0 %   MCV 80.2 78.0 - 100.0 fL   MCH 27.5 26.0 - 34.0 pg   MCHC 34.3 30.0 - 36.0 g/dL   RDW 14.0 11.5 - 15.5 %   Platelets 223 150 - 400 K/uL  Protime-INR - (order if Patient is taking Coumadin / Warfarin)  Result Value Ref Range   Prothrombin Time 13.2 11.6 - 15.2 seconds   INR 0.98 0.00 - 1.49  Brain natriuretic peptide  Result Value Ref Range   B Natriuretic Peptide 388.9 (H) 0.0 - 100.0 pg/mL  Urinalysis, Routine w reflex microscopic (not at [facility])  Result Value Ref Range   Color, Urine YELLOW YELLOW   APPearance TURBID (A) CLEAR   Specific Gravity, Urine 1.021 1.005 - 1.030   pH 5.0 5.0 - 8.0   Glucose, UA 250 (A) NEGATIVE mg/dL   Hgb urine dipstick TRACE (A) NEGATIVE   Bilirubin Urine NEGATIVE NEGATIVE   Ketones, ur NEGATIVE NEGATIVE mg/dL   Protein, ur 100 (A) NEGATIVE mg/dL   Nitrite NEGATIVE NEGATIVE   Leukocytes, UA NEGATIVE NEGATIVE  Urine rapid drug screen (hosp performed)  Result Value Ref Range   Opiates NONE DETECTED NONE DETECTED   Cocaine NONE DETECTED NONE DETECTED   Benzodiazepines NONE DETECTED NONE DETECTED   Amphetamines NONE  DETECTED NONE DETECTED   Tetrahydrocannabinol POSITIVE (A) NONE DETECTED   Barbiturates NONE DETECTED NONE DETECTED  Urine microscopic-add on  Result Value Ref Range   Squamous Epithelial / LPF NONE SEEN NONE SEEN   WBC, UA NONE SEEN 0 - 5 WBC/hpf   RBC / HPF NONE SEEN 0 - 5 RBC/hpf   Bacteria, UA NONE SEEN NONE SEEN   Casts HYALINE CASTS (A) NEGATIVE  I-stat troponin, ED (not at [facility], [facility])  Result Value Ref Range   Troponin i, poc 0.04 0.00 - 0.08 ng/mL   Comment 3          CBG monitoring, ED  Result Value Ref Range   Glucose-Capillary 194 (H) 65 - 99 mg/dL   Dg Chest 2 View  09/27/2015  CLINICAL DATA:  Shortness of breath and chest pain for days the EXAM: CHEST  2 VIEW COMPARISON:  05/09/2014 FINDINGS: Heart size is normal as is the vascular pattern. Coronary stent noted. Mild atelectatic change at both lung bases. No pleural effusion. IMPRESSION: No significant acute findings Electronically Signed   By: Skipper Cliche M.D.   On: 09/27/2015 11:54       I have personally reviewed and evaluated these images and lab results as part of my medical decision-making.   EKG Interpretation   Date/Time:  Friday September 27 2015 10:44:05 EST Ventricular Rate:  125 PR Interval:  134 QRS Duration: 92 QT Interval:  328 QTC Calculation: 473 R Axis:   -85 Text Interpretation:  Sinus tachycardia Left anterior fascicular block  Non-specific ST-t changes `no acute st/t changes from prior, rate is  faster Confirmed by Eden Rho  MD, Lennette Bihari ([id number]) on 09/27/2015 11:36:44 AM      MDM   Iv ns. Continuous pulse ox and monitor.   o2 [place].  Labs. Cxr. Ecg.  Reviewed nursing notes and prior charts for additional history.   Prior cath report and echo reviewed.  Pt hypertensive, off meds.  Ntg ointment.  Home meds given.  Given hx cad/stents, recurrent cp, uncontrolled htn,  will consult cardiology for admission.  Discussed with cardiology/Trish - they will see in ed/admit.   Recheck pt, no  chest pain, bp mildly improved.     Lajean Saver, MD 09/27/15 1524

## 2015-09-27 NOTE — ED Notes (Signed)
Dr. Ashok Cordia made aware of patient's CBG, Dr. Ashok Cordia advised to feed patient prior to giving insulin that is ordered

## 2015-09-27 NOTE — Progress Notes (Signed)
ANTICOAGULATION CONSULT NOTE - Initial Consult  Pharmacy Consult for Heparin Indication: chest pain/ACS  No Known Allergies  Patient Measurements: Height: 6\' 1"  (185.4 cm) Weight: 203 lb 4 oz (92.194 kg) IBW/kg (Calculated) : 79.9 Heparin Dosing Weight: 92 kg  Vital Signs: Temp: 97.9 F (36.6 C) (03/10 1048) Temp Source: Oral (03/10 1048) BP: 110/98 mmHg (03/10 1630) Pulse Rate: 102 (03/10 1630)  Labs:  Recent Labs  09/27/15 1059  HGB 14.2  HCT 41.4  PLT 223  LABPROT 13.2  INR 0.98  CREATININE 1.31*    Estimated Creatinine Clearance: 65.2 mL/min (by C-G formula based on Cr of 1.31).   Medical History: Past Medical History  Diagnosis Date  . Coronary artery disease     a. 2011 MI x 2 with PCI: stent x 2 (LAD and RI) @ [facility] in [place], [place];  b. 04/2014 Cath/PCI: LM 10-20, LAD 40p, 14m, 53m ISR(3.5x38 Xience DES), 50apical, LCX 20 diffuse, RI 50p, 54m ISR, RCA 40-79m, 30d.  . Diabetes mellitus without complication ([place])   . Hypertension   . Stroke [facility])     a. 2012  . Hyperlipidemia   . Ischemic cardiomyopathy     a. 04/2014 Echo: EF 20-25%, anteroseptal, inferior, & basal-mid inferoseptal AK. Sev apical/ant HK, Ao sclerosis, mild MR, triv PI.  Marland Kitchen Chronic systolic congestive heart failure ([place])     a. 04/2014 Echo: EF 20-25%.  Marland Kitchen Hypertensive urgency 09/27/2015  . Acute on chronic combined systolic (congestive) and diastolic (congestive) heart failure ([place]) 09/27/2015    Medications:   (Not in a hospital admission) Scheduled:  . [START ON 09/28/2015] aspirin EC  81 mg Oral Daily  . atorvastatin  80 mg Oral q1800  . carvedilol  25 mg Oral BID WC  . folic acid  1 mg Oral Daily  . furosemide  40 mg Intravenous BID  . [START ON 09/28/2015] insulin aspart  0-9 Units Subcutaneous TID WC   Infusions:  . nitroGLYCERIN      Assessment: 65yo male with history of CAD, ICM, HF, HTN, stroke and DM presents with SOB and CP. Pharmacy is consulted to dose heparin  for ACS/chest pain.   Goal of Therapy:  Heparin level 0.3-0.7 units/ml Monitor platelets by anticoagulation protocol: Yes   Plan:  Give 4000 units bolus x 1 Start heparin infusion at 1250 units/hr Check anti-Xa level in 6 hours and daily while on heparin Continue to monitor H&H and platelets  Andrey Cota. Diona Foley, PharmD, [place] Pharmacist Pager [phone] 09/27/2015,5:55 PM

## 2015-09-27 NOTE — ED Notes (Signed)
Pt in radiology. Radiology transport to bring pt to room upon completion of images.

## 2015-09-27 NOTE — ED Notes (Signed)
Attempted report 

## 2015-09-27 NOTE — ED Notes (Signed)
Patient provided sandwich

## 2015-09-27 NOTE — H&P (Signed)
Patient ID: Sahibjot Siemens MRN: [id number], DOB/AGE: [id number]   Admit date: 09/27/2015  Primary Physician: No primary care provider on file. Primary Cardiologist: Hochrein  Pt. Profile:  Antonio Guerra is a 65 y.o. male with a history of CAD, ischemic cardiomyopathy, chronic systolic HF, HTN, stroke, DM,  who presented to [facility] ED for evaluation of SOB and chest pain.   HPI: As above. Patient was admitted 04/2014 with  unstable angina. Seen by [facility] heart care first time. Attempt was made to obtain records from [facility] in [place], [place] where his prior procedures were performed however, attempts were unsuccessful. 2-D echocardiography was performed revealing an EF of 20-25% with multiple wall motion abnormalities. He underwent diagnostic catheterization May 11, 2014, revealing severe in-stent restenosis within the mid LAD and otherwise nonobstructive coronary artery disease. The LAD was successfully treated using a 3.5 x 38 mm Xience drug-eluting stent.  Since discharge he has not followed up as outpatient. Care was managed my PCP. Approximately 3 months ago he had a some issue with PCP and stopped seeing his primary care provider. Subsequently, he stopped taking all of his medication except aspirin 81 mg and lantus for diabetes.  At the past 3-5 days he has being having intermittent left-sided chest pain. He described the pain as a pressure which worse with exertion and relieved with rest. No radiation of pain. Also admits to having intermittent shortness of breath, orthopnea, dizziness and PND. Last night he was unable to lay flat and came to ED for further evaluation. The patient denies lower extremity edema, palpitations, nausea, vomiting, blood in stool or urine. He is admits to having a dry cough and intermittent diarrhea. No recent sick contacts. Last use of marijuana 2 days ago. Denies tobacco or cocaine abuse.  EKG shows sinus tachycardia at rate  of 125bpm, LAFB, nonspecific ST and T-wave changes necessary in lateral lead. Which appears similar compared to prior EKG of 05/12/2014.  Blood pressure of 176/118 upon presentation. Point-of-care troponin 0.04. BNP 388.9. Creatinine 1.31. Blood glucose 224. Urine drug screen positive for marijuana. Chest x-ray clear.  The patient was given aspirin 81 mg, Coreg 25 mg Lasix 20 mg by mouth and nitroglycerin patch in ED.   Problem List  Past Medical History  Diagnosis Date  . Coronary artery disease     a. 2011 MI x 2 with PCI: stent x 2 (LAD and RI) @ [facility] in [place], [place];  b. 04/2014 Cath/PCI: LM 10-20, LAD 40p, 44m, 19m ISR(3.5x38 Xience DES), 50apical, LCX 20 diffuse, RI 50p, 57m ISR, RCA 40-63m, 30d.  . Diabetes mellitus without complication ([place])   . Hypertension   . Stroke [facility])     a. 2012  . Hyperlipidemia   . Ischemic cardiomyopathy     a. 04/2014 Echo: EF 20-25%, anteroseptal, inferior, & basal-mid inferoseptal AK. Sev apical/ant HK, Ao sclerosis, mild MR, triv PI.  Marland Kitchen Chronic systolic congestive heart failure ([place])     a. 04/2014 Echo: EF 20-25%.    Past Surgical History  Procedure Laterality Date  . Ankle surgery    . Coronary stent placement    . Left heart catheterization with coronary/graft angiogram N/A 05/11/2014    Procedure: LEFT HEART CATHETERIZATION WITH Beatrix Fetters;  Surgeon: Burnell Blanks, MD;  Location: [facility] CATH LAB;  Service: Cardiovascular;  Laterality: N/A;  . Percutaneous coronary stent intervention (pci-s)  05/11/2014    Procedure: PERCUTANEOUS CORONARY STENT INTERVENTION (PCI-S);  Surgeon: Harrell Gave  Santina Evans, MD;  Location: [facility] CATH LAB;  Service: Cardiovascular;;  . Fractional flow reserve wire  05/11/2014    Procedure: FRACTIONAL FLOW RESERVE WIRE;  Surgeon: Burnell Blanks, MD;  Location: [facility] CATH LAB;  Service: Cardiovascular;;     Allergies  No Known Allergies   Home Medications  Prior to Admission  medications   Medication Sig Start Date End Date Taking? Authorizing Provider  aspirin EC 81 MG tablet Take 81 mg by mouth daily.   Yes Historical Provider, MD  atorvastatin (LIPITOR) 80 MG tablet Take 1 tablet (80 mg total) by mouth daily at 6 PM. 05/12/14  Yes Brett Canales, PA-C  carvedilol (COREG) 25 MG tablet Take 1 tablet (25 mg total) by mouth 2 (two) times daily with a meal. 05/12/14  Yes Brett Canales, PA-C  folic acid (FOLVITE) 1 MG tablet Take 1 mg by mouth daily.   Yes Historical Provider, MD  furosemide (LASIX) 20 MG tablet Take 20 mg by mouth daily.   Yes Historical Provider, MD  clopidogrel (PLAVIX) 75 MG tablet Take 1 tablet (75 mg total) by mouth daily with breakfast. Patient not taking: Reported on 09/27/2015 05/12/14   Brett Canales, PA-C  insulin glargine (LANTUS) 100 UNIT/ML injection Inject 20-25 Units into the skin 2 (two) times daily. Take 20 units every morning and 25 units at bedtime.    Historical Provider, MD  nitroGLYCERIN (NITROSTAT) 0.4 MG SL tablet Place 1 tablet (0.4 mg total) under the tongue every 5 (five) minutes as needed for chest pain. 05/12/14   Brett Canales, PA-C    Family History  Family History  Problem Relation Age of Onset  . Diabetes Mother   . CAD Neg Hx    Patient states that he does not know any family history of cardiac problem  Social History  Social History   Social History  . Marital Status: Single    Spouse Name: N/A  . Number of Children: N/A  . Years of Education: N/A   Occupational History  . Not on file.   Social History Main Topics  . Smoking status: Never Smoker   . Smokeless tobacco: Never Used  . Alcohol Use: No  . Drug Use: No  . Sexual Activity: No   Other Topics Concern  . Not on file   Social History Narrative     Review of Systems General:  No chills, fever, night sweats or weight changes.  Cardiovascular:  No chest pain, dyspnea on exertion, edema, orthopnea, palpitations, paroxysmal nocturnal  dyspnea. Dermatological: No rash, lesions/masses Respiratory: No cough, dyspnea Urologic: No hematuria, dysuria Abdominal:   No nausea, vomiting, diarrhea, bright red blood per rectum, melena, or hematemesis Neurologic:  No visual changes, wkns, changes in mental status. All other systems reviewed and are otherwise negative except as noted above.  Physical Exam  Blood pressure 145/110, pulse 119, temperature 97.9 F (36.6 C), temperature source Oral, resp. rate 34, height 6\' 1"  (1.854 m), weight 203 lb 4 oz (92.194 kg), SpO2 96 %.  General: Pleasant, NAD Psych: Normal affect. Neuro: Alert and oriented X 3. Moves all extremities spontaneously. HEENT: Normal  Neck: Supple without bruits or JVD. Lungs:  Resp regular and unlabored, CTA. Heart: RRR no s3, s4, or murmurs. Abdomen: Soft, non-tender, non-distended, BS + x 4.  Extremities: No clubbing, cyanosis or edema. DP/PT/Radials 2+ and equal bilaterally.  Labs  No results for input(s): CKTOTAL, CKMB, TROPONINI in the last 72 hours. Lab Results  Component Value Date   WBC 5.5 09/27/2015   HGB 14.2 09/27/2015   HCT 41.4 09/27/2015   MCV 80.2 09/27/2015   PLT 223 09/27/2015    Recent Labs Lab 09/27/15 1059  NA 138  K 4.3  CL 106  CO2 23  BUN 16  CREATININE 1.31*  CALCIUM 9.5  GLUCOSE 224*   No results found for: CHOL, HDL, LDLCALC, TRIG No results found for: DDIMER   Radiology/Studies  Dg Chest 2 View  09/27/2015  CLINICAL DATA:  Shortness of breath and chest pain for days the EXAM: CHEST  2 VIEW COMPARISON:  05/09/2014 FINDINGS: Heart size is normal as is the vascular pattern. Coronary stent noted. Mild atelectatic change at both lung bases. No pleural effusion. IMPRESSION: No significant acute findings Electronically Signed   By: Skipper Cliche M.D.   On: 09/27/2015 11:54    2D Echocardiogram 10.22.2015  Study Conclusions  - Left ventricle: There is a false tendon int the mid LV cavity of no clinical  signficance. E/e&'>24 consistent with elevated LV filling pressures. There is akinesis of the apical septum. The cavity size was mildly dilated. Systolic function was severely reduced. The estimated ejection fraction was in the range of 20% to 25%. There is akinesis of the entireanteroseptal myocardium. There is akinesis of the basal-midinferoseptal myocardium. There is akinesis of the entireinferior myocardium. There is severe hypokinesis of the apicalanterior myocardium. - Aortic valve: Moderate thickening and calcification, consistent with sclerosis. - Mitral valve: Elongated anterior mitral valve leaftet with calcified tip. There was mild regurgitation. - Pulmonic valve: There was trivial regurgitation. _____________  Cardiac Catheterization and Percutaneous Coronary Intervention 10.23.2015  Hemodynamic Findings: Central aortic pressure: 119/76 Left ventricular pressure: 119/12/16  Angiographic Findings:  Left main: Long segment with diffuse 10-20% stenosis.  Left Anterior Descending Artery: Large caliber vessel that courses to the apex. The proximal vessel has 40% stenosis. The mid vessel has a 60% stenosis followed by a patent mid stented segment. There is diffuse restenosis with more focal 80% restenosis in the distal edge of the stented segment. Small distal diagonal branch. The LAD wraps around the apex. At the apex the vessel has a 50% stenosis.   **The mid LAD was successfully stented using a 3.5 x 38 mm Xience DES.**  Circumflex Artery: Moderate caliber vessel with diffuse 20% stenosis. Small caliber obtuse marginal branch. Large caliber ramus intermediate branch with 50% proximal stenosis, patent mid stent with diffuse 20% stent restenosis. There is a sub-branch of the intermediate that is small to moderate in caliber with diffuse proximal 40% stenosis.  Right Coronary Artery: Large dominant vessel with 40-50% mid stenosis. The distal  vessel has diffuse 30% stenosis.  Left Ventricular Angiogram: Deferred.  _____________   ECG  EKG shows sinus tachycardia, LAFB, nonspecific ST and T-wave changes necessary in lateral lead.  Vent. rate 125 BPM PR interval 134 ms QRS duration 92 ms QT/QTc 328/473 ms P-R-T axes 73 -85 103  ASSESSMENT AND PLAN  1. Chest pain - With typical and atypical features. Concerning for unstable angina in setting uncontrolled HTN and sinus tachycardia. History of ischemic cardiomyopathy with stent placement to mid LAD 04/2014. He also has diffuse nonobstructive disease. He has not follow-up with cardiology since last admission. He stopped taking all of his medication for the past 3 months except aspirin and Lantus. - Point-of-care troponin 0.04. Minimally elevated BNP. Sinus tachycardia noted on EKG. No acute changes. - Suspect his symptoms due to worsening of coronary disease, acute  CHF and medication noncompliance. - Cycle troponin and serial EKG. Will get echo, TSH, A1c and lipid panel. Likely cath Monday.  - start IV heparin, asa 81mg , coreg 25mg  BID, lipitor 80mg  and  nitro gtt.   2. Ischemic cardiomyopathy - Left ventricular function of 20-25% on last echocardiogram 04/2014. As above.  3. DM - Will place on SSI.   4. AKI - Daily BMET. Hopefully improves with diuresis. Start ACE once stable renal function.   5. HTN, Uncontrolled upon presentation - improved on BB.   6. Sinus tachycardia - Rate improved.    Signed, Leanor Kail, PA-C 09/27/2015, 2:38 PM Pager [phone]

## 2015-09-27 NOTE — ED Notes (Signed)
Pt c/o left sided chest pain onset yesterday with shortness of breath. Pt unable to talk in complete sentences. Pt does not have a PMD at this time. Pt is out of his diabetes medication since yesterday and heart medications  For some.

## 2015-09-28 ENCOUNTER — Other Ambulatory Visit (HOSPITAL_COMMUNITY): Payer: Medicaid Other

## 2015-09-28 DIAGNOSIS — I25110 Atherosclerotic heart disease of native coronary artery with unstable angina pectoris: Principal | ICD-10-CM

## 2015-09-28 LAB — CBC
HCT: 39.2 % (ref 39.0–52.0)
HEMATOCRIT: 38.9 % — AB (ref 39.0–52.0)
HEMATOCRIT: 38.3 % — AB (ref 39.0–52.0)
HEMOGLOBIN: 13.3 g/dL (ref 13.0–17.0)
HEMOGLOBIN: 13.3 g/dL (ref 13.0–17.0)
HEMOGLOBIN: 13.5 g/dL (ref 13.0–17.0)
MCH: 27.4 pg (ref 26.0–34.0)
MCH: 27.4 pg (ref 26.0–34.0)
MCH: 27.9 pg (ref 26.0–34.0)
MCHC: 34.2 g/dL (ref 30.0–36.0)
MCHC: 34.4 g/dL (ref 30.0–36.0)
MCHC: 34.7 g/dL (ref 30.0–36.0)
MCV: 79.5 fL (ref 78.0–100.0)
MCV: 80 fL (ref 78.0–100.0)
MCV: 80.5 fL (ref 78.0–100.0)
PLATELETS: 196 10*3/uL (ref 150–400)
Platelets: 194 10*3/uL (ref 150–400)
Platelets: 207 10*3/uL (ref 150–400)
RBC: 4.76 MIL/uL (ref 4.22–5.81)
RBC: 4.86 MIL/uL (ref 4.22–5.81)
RBC: 4.93 MIL/uL (ref 4.22–5.81)
RDW: 13.9 % (ref 11.5–15.5)
RDW: 14 % (ref 11.5–15.5)
RDW: 14.1 % (ref 11.5–15.5)
WBC: 6.6 10*3/uL (ref 4.0–10.5)
WBC: 5.5 10*3/uL (ref 4.0–10.5)
WBC: 6.8 10*3/uL (ref 4.0–10.5)

## 2015-09-28 LAB — GLUCOSE, CAPILLARY
GLUCOSE-CAPILLARY: 258 mg/dL — AB (ref 65–99)
Glucose-Capillary: 154 mg/dL — ABNORMAL HIGH (ref 65–99)
Glucose-Capillary: 207 mg/dL — ABNORMAL HIGH (ref 65–99)
Glucose-Capillary: 234 mg/dL — ABNORMAL HIGH (ref 65–99)

## 2015-09-28 LAB — LIPID PANEL
CHOL/HDL RATIO: 4.5 ratio
CHOLESTEROL: 238 mg/dL — AB (ref 0–200)
HDL: 53 mg/dL (ref >40)
LDL CALC: 158 mg/dL — AB (ref 0–99)
TRIGLYCERIDES: 137 mg/dL (ref <150)
VLDL: 27 mg/dL (ref 0–40)

## 2015-09-28 LAB — BASIC METABOLIC PANEL
ANION GAP: 10 (ref 5–15)
BUN: 16 mg/dL (ref 6–20)
CALCIUM: 9.3 mg/dL (ref 8.9–10.3)
CHLORIDE: 104 mmol/L (ref 101–111)
CO2: 26 mmol/L (ref 22–32)
Creatinine, Ser: 1.13 mg/dL (ref 0.61–1.24)
GLUCOSE: 159 mg/dL — AB (ref 65–99)
Potassium: 3.8 mmol/L (ref 3.5–5.1)
SODIUM: 140 mmol/L (ref 135–145)

## 2015-09-28 LAB — HEMOGLOBIN A1C
HEMOGLOBIN A1C: 9.1 % — AB (ref 4.8–5.6)
Mean Plasma Glucose: 214 mg/dL

## 2015-09-28 LAB — TROPONIN I
TROPONIN I: 0.04 ng/mL — AB (ref <0.031)
Troponin I: 0.04 ng/mL — ABNORMAL HIGH (ref <0.031)

## 2015-09-28 LAB — HEPARIN LEVEL (UNFRACTIONATED)
HEPARIN UNFRACTIONATED: 0.61 [IU]/mL (ref 0.30–0.70)
Heparin Unfractionated: 0.38 IU/mL (ref 0.30–0.70)
Heparin Unfractionated: 0.81 IU/mL — ABNORMAL HIGH (ref 0.30–0.70)

## 2015-09-28 MED ORDER — METOPROLOL TARTRATE 12.5 MG HALF TABLET
12.5000 mg | ORAL_TABLET | Freq: Two times a day (BID) | ORAL | Status: DC
  Administered 2015-09-28 – 2015-09-30 (×5): 12.5 mg via ORAL
  Filled 2015-09-28 (×5): qty 1

## 2015-09-28 NOTE — Progress Notes (Signed)
ANTICOAGULATION CONSULT NOTE - Follow Up Consult  Pharmacy Consult for heparin Indication: chest pain/ACS  No Known Allergies  Patient Measurements: Height: 6\' 1"  (185.4 cm) Weight: 196 lb 8 oz (89.132 kg) IBW/kg (Calculated) : 79.9 Heparin Dosing Weight:89 kg  Vital Signs: Temp: 98.6 F (37 C) (03/11 1600) Temp Source: Oral (03/11 1600) BP: 122/95 mmHg (03/11 1600) Pulse Rate: 98 (03/11 1600)  Labs:  Recent Labs  09/27/15 1059 09/27/15 1825 09/28/15 0013 09/28/15 0014 09/28/15 0525 09/28/15 1503  HGB 14.2  --  13.5  --  13.3  --   HCT 41.4  --  39.2  --  38.9*  --   PLT 223  --  207  --  194  --   LABPROT 13.2  --   --   --   --   --   INR 0.98  --   --   --   --   --   HEPARINUNFRC  --   --  0.61  --  0.38 0.81*  CREATININE 1.31*  --   --   --  1.13  --   TROPONINI  --  0.05*  --  0.04* 0.04*  --     Estimated Creatinine Clearance: 75.6 mL/min (by C-G formula based on Cr of 1.13).   Medications:  Infusions:  . heparin 1,250 Units/hr (09/27/15 1835)  . nitroGLYCERIN 10 mcg/min (09/28/15 0705)    Assessment: 65 yo man admitted 09/27/2015 for SOB and CP, concerning for unstable angina. Stopped taking meds except for ASA & Lantus ~3 mos ago. Pharmacy consulted to dose heparin. Planned L&RH cath Monday.   PMH CAD s/p DES to LAD, ICM, HFrEF (EF 20-25%), HTN, CVA, DM  HL 0.38 therapeutic this am on heparin 1250 units/h now above goal on recheck at 0.8. Hgb & plt wnl.  Goal of Therapy:  Heparin level 0.3-0.7 units/ml Monitor platelets by anticoagulation protocol: Yes   Plan:  Decrease heparin to 1050 units/hr Daily HL & CBC Monitor s/sx bleeding Cath Monday  Erin Hearing PharmD., BCPS Clinical Pharmacist Pager [phone] 09/28/2015 5:19 PM

## 2015-09-28 NOTE — Progress Notes (Signed)
ANTICOAGULATION CONSULT NOTE - Follow Up Consult  Pharmacy Consult for heparin Indication: chest pain/ACS  No Known Allergies  Patient Measurements: Height: 6\' 1"  (185.4 cm) Weight: 196 lb 8 oz (89.132 kg) IBW/kg (Calculated) : 79.9 Heparin Dosing Weight:89 kg  Vital Signs: Temp: 98.9 F (37.2 C) (03/11 0400) Temp Source: Oral (03/11 0400) BP: 152/94 mmHg (03/11 0808) Pulse Rate: 98 (03/11 0813)  Labs:  Recent Labs  09/27/15 1059 09/27/15 1825 09/28/15 0013 09/28/15 0014 09/28/15 0525  HGB 14.2  --  13.5  --  13.3  HCT 41.4  --  39.2  --  38.9*  PLT 223  --  207  --  194  LABPROT 13.2  --   --   --   --   INR 0.98  --   --   --   --   HEPARINUNFRC  --   --  0.61  --  0.38  CREATININE 1.31*  --   --   --  1.13  TROPONINI  --  0.05*  --  0.04* 0.04*    Estimated Creatinine Clearance: 75.6 mL/min (by C-G formula based on Cr of 1.13).   Medications:  Infusions:  . heparin 1,250 Units/hr (09/27/15 1835)  . nitroGLYCERIN 10 mcg/min (09/28/15 0705)    Assessment: 65 yo man admitted 09/27/2015 for SOB and CP, concerning for unstable angina. Stopped taking meds except for ASA & Lantus ~3 mos ago. Pharmacy consulted to dose heparin. Planned L&RH cath Monday.   PMH CAD s/p DES to LAD, ICM, HFrEF (EF 20-25%), HTN, CVA, DM  HL 0.38 therapeutic on heparin 1250 units/h. Hgb & plt wnl.  Goal of Therapy:  Heparin level 0.3-0.7 units/ml Monitor platelets by anticoagulation protocol: Yes   Plan:  Continue heparin 1250 units/h Daily HL & CBC Monitor s/sx bleeding Cath Monday   Iolani Twilley, [place].D., BCPS PGY2 Cardiology Pharmacy Resident Pager: [phone]  09/28/2015,8:59 AM

## 2015-09-28 NOTE — Progress Notes (Signed)
SUBJECTIVE:  Still having episodes of CP  OBJECTIVE:   Vitals:   Filed Vitals:   09/28/15 0500 09/28/15 0600 09/28/15 0808 09/28/15 0813  BP: 137/100 132/82 152/94   Pulse:    98  Temp:      TempSrc:      Resp: 28 12 14    Height:      Weight:      SpO2:   99%    I&O's:   Intake/Output Summary (Last 24 hours) at 09/28/15 1026 Last data filed at 09/28/15 [id number]  Gross per 24 hour  Intake 303.65 ml  Output   1950 ml  Net -1646.35 ml   TELEMETRY: Reviewed telemetry pt in NSR:     PHYSICAL EXAM General: Well developed, well nourished, in no acute distress Head: Eyes PERRLA, No xanthomas.   Normal cephalic and atramatic  Lungs:   Clear bilaterally to auscultation and percussion. Heart:   HRRR S1 S2 Pulses are 2+ & equal. Abdomen: Bowel sounds are positive, abdomen soft and non-tender without masses  Extremities:   No clubbing, cyanosis or edema.  DP +1 Neuro: Alert and oriented X 3. Psych:  Good affect, responds appropriately   LABS: Basic Metabolic Panel:  Recent Labs  09/27/15 1059 09/28/15 0525  NA 138 140  K 4.3 3.8  CL 106 104  CO2 23 26  GLUCOSE 224* 159*  BUN 16 16  CREATININE 1.31* 1.13  CALCIUM 9.5 9.3   Liver Function Tests:  Recent Labs  09/27/15 1825  AST 26  ALT 24  ALKPHOS 85  BILITOT 0.9  PROT 6.9  ALBUMIN 3.6   No results for input(s): LIPASE, AMYLASE in the last 72 hours. CBC:  Recent Labs  09/28/15 0013 09/28/15 0525  WBC 6.8 6.6  HGB 13.5 13.3  HCT 39.2 38.9*  MCV 79.5 80.0  PLT 207 194   Cardiac Enzymes:  Recent Labs  09/27/15 1825 09/28/15 0014 09/28/15 0525  TROPONINI 0.05* 0.04* 0.04*   BNP: Invalid input(s): POCBNP D-Dimer: No results for input(s): DDIMER in the last 72 hours. Hemoglobin A1C:  Recent Labs  09/27/15 1825  HGBA1C 9.1*   Fasting Lipid Panel:  Recent Labs  09/28/15 0525  CHOL 238*  HDL 53  LDLCALC 158*  TRIG 137  CHOLHDL 4.5   Thyroid Function Tests:  Recent Labs  09/27/15 1825  TSH 4.454   Anemia Panel: No results for input(s): VITAMINB12, FOLATE, FERRITIN, TIBC, IRON, RETICCTPCT in the last 72 hours. Coag Panel:   Lab Results  Component Value Date   INR 0.98 09/27/2015   INR 1.03 05/09/2014    RADIOLOGY: Dg Chest 2 View  09/27/2015  CLINICAL DATA:  Shortness of breath and chest pain for days the EXAM: CHEST  2 VIEW COMPARISON:  05/09/2014 FINDINGS: Heart size is normal as is the vascular pattern. Coronary stent noted. Mild atelectatic change at both lung bases. No pleural effusion. IMPRESSION: No significant acute findings Electronically Signed   By: Skipper Cliche M.D.   On: 09/27/2015 11:54    ASSESSMENT AND PLAN  1. Chest pain - With typical and atypical features. Concerning for unstable angina in setting uncontrolled HTN and sinus tachycardia. History of ischemic cardiomyopathy with stent placement to mid LAD 04/2014. He also has diffuse nonobstructive disease. He has not follow-up with cardiology since last admission. He stopped taking all of his medication for the past 3 months except aspirin and Lantus. - Minimally elevated troponin 04 x 2.  Mildly elevated BNP. Sinus  tachycardia noted on EKG. No acute changes. - Suspect his symptoms are due to worsening of coronary disease, subendocardial ischemia from hypertensive urgency and acute CHF due to medication noncompliance.  TSH is normal.   - Will get echo - Plan  cath Monday.  - Continue  IV heparin, asa 81mg , coreg 25mg  BID, lipitor 80mg  and nitro gtt.   2. Ischemic cardiomyopathy with acute on chronic systolic CHF - Left ventricular function of 20-25% on last echocardiogram 04/2014. As above. 2D echo pending.   - UOP good.  He is net neg 1.6L. - continue IV diuresis.  Renal function improved.  3. DM - HbgA1C 9.1 - Will place on SSI.  - Hospitalist consult for management of uncontrolled DM  4. AKI - Daily BMET. Improved with diuresis. Start ACE if renal function remains stable  post cath.  5. HTN, Uncontrolled upon presentation - improved on BB. Will add Lopressor since BP still borderline elevated and still tachycardic.   6. Sinus tachycardia - Rate improved. Continue BB.  Coreg dose maxed out.  Will add Lopressor 12.5mg  BID to help with tachycardia.    7.  Dyslipidemia - LDL not at goal and started high dose statin.  Will need repeat FLP and ALT in 6 weeks.    Sueanne Margarita, MD  09/28/2015  10:26 AM

## 2015-09-28 NOTE — Progress Notes (Signed)
ANTICOAGULATION CONSULT NOTE - Follow Up Consult  Pharmacy Consult for heparin Indication: chest pain/ACS  Labs:  Recent Labs  09/27/15 1059 09/27/15 1825 09/28/15 0013  HGB 14.2  --  13.5  HCT 41.4  --  39.2  PLT 223  --  207  LABPROT 13.2  --   --   INR 0.98  --   --   HEPARINUNFRC  --   --  0.61  CREATININE 1.31*  --   --   TROPONINI  --  0.05*  --      Assessment/Plan:  65yo male therapeutic on heparin with initial dosing for CP. Will continue gtt at current rate and confirm stable with additional level.   Wynona Neat, PharmD, BCPS  09/28/2015,12:39 AM

## 2015-09-29 ENCOUNTER — Inpatient Hospital Stay (HOSPITAL_COMMUNITY): Payer: Medicaid Other

## 2015-09-29 DIAGNOSIS — R072 Precordial pain: Secondary | ICD-10-CM

## 2015-09-29 DIAGNOSIS — E119 Type 2 diabetes mellitus without complications: Secondary | ICD-10-CM

## 2015-09-29 DIAGNOSIS — I10 Essential (primary) hypertension: Secondary | ICD-10-CM

## 2015-09-29 DIAGNOSIS — I255 Ischemic cardiomyopathy: Secondary | ICD-10-CM

## 2015-09-29 LAB — GLUCOSE, CAPILLARY
GLUCOSE-CAPILLARY: 200 mg/dL — AB (ref 65–99)
GLUCOSE-CAPILLARY: 286 mg/dL — AB (ref 65–99)
GLUCOSE-CAPILLARY: 265 mg/dL — AB (ref 65–99)
Glucose-Capillary: 291 mg/dL — ABNORMAL HIGH (ref 65–99)

## 2015-09-29 LAB — ECHOCARDIOGRAM COMPLETE
AORTIC ROOT 2D: 30 mm
CHL CUP LA VOL 2D INDEX: 28.1 mL/m2
EERAT: 17.62
EWDT: 127 ms
FS: 10 % — AB (ref 28–44)
HEIGHTINCHES: 73 in
IVS/LV PW RATIO, ED: 0.66
LA ID, A-P, ES: 42 mm
LA SIZE INDEX: 1.97 mm/m2
LA VOL 2D: 59.8 mL
LA vol index: 36.5 mL/m2
LAVOL: 77.8 mL
LEFT ATRIUM END SYS DIAM: 42 mm
LV PW d: 17.2 mm — AB (ref 0.6–1.1)
LV PW s: 17.2 mm
LV TDI E'LATERAL: 7.83 cm/s
LV TDI E'MEDIAL: 4.35 cm/s
LVIDD: 51.1 mm — AB (ref 3.5–6.0)
LVIDS: 46.1 mm — AB (ref 2.1–4.0)
LVOT area: 3.14 cm2
LVOT diameter: 20 mm
MV Dec: 127 ms
MV pk A vel: 76.5 cm/s
MV pk E vel: 138 cm/s
MVPG: 8 mmHg
WEIGHTICAEL: 3123.2 [oz_av]

## 2015-09-29 LAB — BASIC METABOLIC PANEL
ANION GAP: 14 (ref 5–15)
BUN: 22 mg/dL — ABNORMAL HIGH (ref 6–20)
CALCIUM: 9.2 mg/dL (ref 8.9–10.3)
CO2: 22 mmol/L (ref 22–32)
Chloride: 96 mmol/L — ABNORMAL LOW (ref 101–111)
Creatinine, Ser: 1.38 mg/dL — ABNORMAL HIGH (ref 0.61–1.24)
GFR calc Af Amer: >60 mL/min (ref >60)
GFR, EST NON AFRICAN AMERICAN: 53 mL/min — AB (ref >60)
GLUCOSE: 313 mg/dL — AB (ref 65–99)
Potassium: 4.1 mmol/L (ref 3.5–5.1)
SODIUM: 132 mmol/L — AB (ref 135–145)

## 2015-09-29 LAB — HEPARIN LEVEL (UNFRACTIONATED): Heparin Unfractionated: 0.64 IU/mL (ref 0.30–0.70)

## 2015-09-29 LAB — PROTIME-INR
INR: 1.09 (ref 0.00–1.49)
PROTHROMBIN TIME: 14.3 s (ref 11.6–15.2)

## 2015-09-29 MED ORDER — SODIUM CHLORIDE 0.9 % IV SOLN: INTRAVENOUS | Status: DC

## 2015-09-29 MED ORDER — SODIUM CHLORIDE 0.9% FLUSH: 3.0000 mL | Freq: Two times a day (BID) | INTRAVENOUS | Status: DC

## 2015-09-29 MED ORDER — SODIUM CHLORIDE 0.9 % IV SOLN
INTRAVENOUS | Status: DC
  Administered 2015-09-30: 06:00:00 via INTRAVENOUS

## 2015-09-29 MED ORDER — ASPIRIN 81 MG PO CHEW: 81.0000 mg | CHEWABLE_TABLET | ORAL | Status: DC

## 2015-09-29 MED ORDER — INSULIN DETEMIR 100 UNIT/ML ~~LOC~~ SOLN
15.0000 [IU] | Freq: Every day | SUBCUTANEOUS | Status: DC
  Filled 2015-09-29 (×2): qty 0.15

## 2015-09-29 MED ORDER — SODIUM CHLORIDE 0.9% FLUSH
3.0000 mL | Freq: Two times a day (BID) | INTRAVENOUS | Status: DC
  Administered 2015-09-29: 3 mL via INTRAVENOUS

## 2015-09-29 MED ORDER — SODIUM CHLORIDE 0.9 % IV SOLN: 250.0000 mL | INTRAVENOUS | Status: DC | PRN

## 2015-09-29 MED ORDER — SODIUM CHLORIDE 0.9% FLUSH: 3.0000 mL | INTRAVENOUS | Status: DC | PRN

## 2015-09-29 MED ORDER — ASPIRIN 81 MG PO CHEW
81.0000 mg | CHEWABLE_TABLET | ORAL | Status: AC
  Administered 2015-09-30: 81 mg via ORAL
  Filled 2015-09-29: qty 1

## 2015-09-29 NOTE — Consult Note (Addendum)
[facility] Medical Consultation  Antonio Guerra [id number] DOB: 19-Jul-1951 DOA: 09/27/2015 PCP: No primary care provider on file.   Requesting physician:  Date of consultation: 09/29/2015 Reason for consultation: Management of diabetes  Impression/Recommendations Active Problems:   Unstable angina ([place])   Ischemic cardiomyopathy   Hyperlipidemia   Coronary artery disease   Hypertensive urgency   Acute on chronic combined systolic (congestive) and diastolic (congestive) heart failure (HCC)    Diabetes mellitus type 2 - uncontrolled -Start Levemir 15 units subcutaneous care of sleep. Patient reports take been a total of 45 units subcutaneous daily at home but here patient's glucose is not high enough to warrant starting at such a high dose. Titrate up as indicated. -Continue ADA diet and sliding scale insulin  Dyslipidemia -Stable, continue atorvastatin  Hypertension -Stable, on Coreg  Chest pain -Management per cardiology  I will followup again tomorrow. Please contact me if I can be of assistance in the meanwhile. Thank you for this consultation.  Chief Complaint: Chest pain  HPI:  This is a 65 year old male with known history of coronary artery disease, who was been noncompliant with his medications, he has not taken any in the last 2 months except for his diabetes medications. He has been short of breath for the past 3-4 days. He denies any wheezing. Today he developed left-sided chest pains that radiated to the neck,. He reports some mild lower extremity edema. He states he is unable to breathe well while laying flat. His chest pains have been getting steadily worse. At its worse it was 6/10, currently 0/10. He describes the pain as sharp and intermittent. He decided to come to the ER.  Review of Systems:  Alt) is reviewed and negative except as noted in history of present illness  Past Medical History  Diagnosis Date  . Coronary artery disease     a. 2011 MI  x 2 with PCI: stent x 2 (LAD and RI) @ [facility] in [place], [place];  b. 04/2014 Cath/PCI: LM 10-20, LAD 40p, 33m, 1m ISR(3.5x38 Xience DES), 50apical, LCX 20 diffuse, RI 50p, 89m ISR, RCA 40-11m, 30d.  . Diabetes mellitus without complication ([place])   . Hypertension   . Stroke [facility])     a. 2012  . Hyperlipidemia   . Ischemic cardiomyopathy     a. 04/2014 Echo: EF 20-25%, anteroseptal, inferior, & basal-mid inferoseptal AK. Sev apical/ant HK, Ao sclerosis, mild MR, triv PI.  Marland Kitchen Chronic systolic congestive heart failure ([place])     a. 04/2014 Echo: EF 20-25%.  Marland Kitchen Hypertensive urgency 09/27/2015  . Acute on chronic combined systolic (congestive) and diastolic (congestive) heart failure ([place]) 09/27/2015   Past Surgical History  Procedure Laterality Date  . Ankle surgery    . Coronary stent placement    . Left heart catheterization with coronary/graft angiogram N/A 05/11/2014    Procedure: LEFT HEART CATHETERIZATION WITH Beatrix Fetters;  Surgeon: Burnell Blanks, MD;  Location: [facility] CATH LAB;  Service: Cardiovascular;  Laterality: N/A;  . Percutaneous coronary stent intervention (pci-s)  05/11/2014    Procedure: PERCUTANEOUS CORONARY STENT INTERVENTION (PCI-S);  Surgeon: Burnell Blanks, MD;  Location: [facility] CATH LAB;  Service: Cardiovascular;;  . Fractional flow reserve wire  05/11/2014    Procedure: FRACTIONAL FLOW RESERVE WIRE;  Surgeon: Burnell Blanks, MD;  Location: [facility] CATH LAB;  Service: Cardiovascular;;   Social History:  reports that he has never smoked. He has never used smokeless tobacco. He reports that he does not drink  alcohol or use illicit drugs.  No Known Allergies Family History  Problem Relation Age of Onset  . Diabetes Mother   . CAD Neg Hx     Prior to Admission medications   Medication Sig Start Date End Date Taking? Authorizing Provider  aspirin EC 81 MG tablet Take 81 mg by mouth daily.   Yes Historical Provider, MD  atorvastatin (LIPITOR)  80 MG tablet Take 1 tablet (80 mg total) by mouth daily at 6 PM. 05/12/14  Yes Brett Canales, PA-C  carvedilol (COREG) 25 MG tablet Take 1 tablet (25 mg total) by mouth 2 (two) times daily with a meal. 05/12/14  Yes Brett Canales, PA-C  folic acid (FOLVITE) 1 MG tablet Take 1 mg by mouth daily.   Yes Historical Provider, MD  furosemide (LASIX) 20 MG tablet Take 20 mg by mouth daily.   Yes Historical Provider, MD  clopidogrel (PLAVIX) 75 MG tablet Take 1 tablet (75 mg total) by mouth daily with breakfast. Patient not taking: Reported on 09/27/2015 05/12/14   Brett Canales, PA-C  insulin glargine (LANTUS) 100 UNIT/ML injection Inject 20-25 Units into the skin 2 (two) times daily. Take 20 units every morning and 25 units at bedtime.    Historical Provider, MD  nitroGLYCERIN (NITROSTAT) 0.4 MG SL tablet Place 1 tablet (0.4 mg total) under the tongue every 5 (five) minutes as needed for chest pain. 05/12/14   Brett Canales, PA-C   Physical Exam: Blood pressure 121/87, pulse 92, temperature 97.7 F (36.5 C), temperature source Oral, resp. rate 21, height 6\' 1"  (1.854 m), weight 88.542 kg (195 lb 3.2 oz), SpO2 100 %. Filed Vitals:   09/29/15 2029 09/29/15 2045  BP:  121/87  Pulse:  92  Temp: 97.7 F (36.5 C)   Resp:  21     General:  Alert and oriented male in no acute distress  Eyes: PERRL  Neck: Supple, no thyromegaly, no JVD  Cardiovascular: Regular rate and rhythm without murmurs regular or gallops  Respiratory: clear to auscultation bilaterally, no wheezes appreciated  Abdomen: Soft, mild nonspecific tenderness to palpation - chronic per patient  Skin: No rashes  Musculoskeletal: Trunk, 5/5 in all extremities  Psychiatric: No flat affect  Neurologic: Cranial nerves 2-12 grossly intact  Labs on Admission:  Basic Metabolic Panel:  Recent Labs Lab 09/27/15 1059 09/28/15 0525 09/29/15 1212  NA 138 140 132*  K 4.3 3.8 4.1  CL 106 104 96*  CO2 23 26 22   GLUCOSE 224* 159*  313*  BUN 16 16 22*  CREATININE 1.31* 1.13 1.38*  CALCIUM 9.5 9.3 9.2   Liver Function Tests:   Recent Labs Lab 09/27/15 1825  AST 26  ALT 24  ALKPHOS 85  BILITOT 0.9  PROT 6.9  ALBUMIN 3.6   No results for input(s): LIPASE, AMYLASE in the last 168 hours. No results for input(s): AMMONIA in the last 168 hours. CBC:  Recent Labs Lab 09/27/15 1059 09/28/15 0013 09/28/15 0525 09/28/15 2345  WBC 5.5 6.8 6.6 5.5  HGB 14.2 13.5 13.3 13.3  HCT 41.4 39.2 38.9* 38.3*  MCV 80.2 79.5 80.0 80.5  PLT 223 207 194 196   Cardiac Enzymes:  Recent Labs Lab 09/27/15 1825 09/28/15 0014 09/28/15 0525  TROPONINI 0.05* 0.04* 0.04*   BNP: Invalid input(s): POCBNP CBG:  Recent Labs Lab 09/28/15 1645 09/28/15 2140 09/29/15 0801 09/29/15 1142 09/29/15 1645  GLUCAP 207* 234* 200* 286* 265*    Radiological Exams on Admission:  No results found.   Time spent: [place], [place] [facility] Pager [phone]  If 7PM-7AM, please contact night-coverage [email or web address] Password [facility] 09/29/2015, 10:16 PM

## 2015-09-29 NOTE — Progress Notes (Signed)
ANTICOAGULATION CONSULT NOTE - Follow Up Consult  Pharmacy Consult for heparin Indication: chest pain/ACS  No Known Allergies  Patient Measurements: Height: 6\' 1"  (185.4 cm) Weight: 195 lb 3.2 oz (88.542 kg) IBW/kg (Calculated) : 79.9 Heparin Dosing Weight:89 kg  Vital Signs: Temp: 98.4 F (36.9 C) (03/12 0800) Temp Source: Oral (03/12 0800) BP: 135/92 mmHg (03/12 0837) Pulse Rate: 96 (03/12 0837)  Labs:  Recent Labs  09/27/15 1059 09/27/15 1825  09/28/15 0013 09/28/15 0014 09/28/15 0525 09/28/15 1503 09/28/15 2345  HGB 14.2  --   --  13.5  --  13.3  --  13.3  HCT 41.4  --   --  39.2  --  38.9*  --  38.3*  PLT 223  --   --  207  --  194  --  196  LABPROT 13.2  --   --   --   --   --   --   --   INR 0.98  --   --   --   --   --   --   --   HEPARINUNFRC  --   --   < > 0.61  --  0.38 0.81* 0.64  CREATININE 1.31*  --   --   --   --  1.13  --   --   TROPONINI  --  0.05*  --   --  0.04* 0.04*  --   --   < > = values in this interval not displayed.  Estimated Creatinine Clearance: 75.6 mL/min (by C-G formula based on Cr of 1.13).   Medications:  Infusions:  . [START ON 09/30/2015] sodium chloride    . [START ON 09/30/2015] sodium chloride    . heparin 1,050 Units/hr (09/28/15 1721)  . nitroGLYCERIN 5 mcg/min (09/28/15 1945)    Assessment: 65 yo man admitted 09/27/2015 for SOB and CP, concerning for unstable angina. Stopped taking meds except for ASA & Lantus ~3 mos ago. Pharmacy consulted to dose heparin. Planned cath Monday.   PMH CAD s/p DES to LAD, ICM, HFrEF (EF 20-25%), HTN, CVA, DM  HL 0.64 therapeutic on heparin 1050 units/h. CBC stable.  Goal of Therapy:  Heparin level 0.3-0.7 units/ml Monitor platelets by anticoagulation protocol: Yes   Plan:  Continue heparin 1050 units/h Daily HL & CBC Monitor s/sx bleeding Cath Monday   Kayvan Hoefling, [place].D., BCPS PGY2 Cardiology Pharmacy Resident Pager: [phone]  09/29/2015,10:27 AM

## 2015-09-29 NOTE — Progress Notes (Signed)
  Echocardiogram 2D Echocardiogram has been performed.  Antonio Guerra 09/29/2015, 12:54 PM

## 2015-09-29 NOTE — Progress Notes (Signed)
Per patient he does not want anything different . He does not want angioplasty just stents if warranted.

## 2015-09-29 NOTE — Progress Notes (Signed)
ANTICOAGULATION CONSULT NOTE - Follow Up Consult  Pharmacy Consult for heparin Indication: chest pain/ACS  No Known Allergies  Patient Measurements: Height: 6\' 1"  (185.4 cm) Weight: 196 lb 8 oz (89.132 kg) IBW/kg (Calculated) : 79.9 Heparin Dosing Weight:89 kg  Vital Signs: Temp: 98.3 F (36.8 C) (03/11 2045) Temp Source: Oral (03/11 2045) BP: 124/88 mmHg (03/11 2300) Pulse Rate: 92 (03/11 2045)  Labs:  Recent Labs  09/27/15 1059 09/27/15 1825  09/28/15 0013 09/28/15 0014 09/28/15 0525 09/28/15 1503 09/28/15 2345  HGB 14.2  --   --  13.5  --  13.3  --  13.3  HCT 41.4  --   --  39.2  --  38.9*  --  38.3*  PLT 223  --   --  207  --  194  --  196  LABPROT 13.2  --   --   --   --   --   --   --   INR 0.98  --   --   --   --   --   --   --   HEPARINUNFRC  --   --   < > 0.61  --  0.38 0.81* 0.64  CREATININE 1.31*  --   --   --   --  1.13  --   --   TROPONINI  --  0.05*  --   --  0.04* 0.04*  --   --   < > = values in this interval not displayed.  Estimated Creatinine Clearance: 75.6 mL/min (by C-G formula based on Cr of 1.13).   Medications:  Infusions:  . heparin 1,050 Units/hr (09/28/15 1721)  . nitroGLYCERIN 5 mcg/min (09/28/15 1945)    Assessment: 65 yo man on heparin for unstable angina. Heparin level therapeutic on 1050 units/hr. CBC stable. For cath on Monday.  Goal of Therapy:  Heparin level 0.3-0.7 units/ml Monitor platelets by anticoagulation protocol: Yes   Plan:  Continue heparin at 1050 units/hr Daily HL & CBC  Sherlon Handing, PharmD, BCPS Clinical pharmacist, pager [phone] 09/29/2015 12:06 AM

## 2015-09-29 NOTE — Progress Notes (Signed)
SUBJECTIVE:  No complaints  OBJECTIVE:   Vitals:   Filed Vitals:   09/29/15 0200 09/29/15 0456 09/29/15 0800 09/29/15 0837  BP: 123/83 131/96  135/92  Pulse:  93  96  Temp:  97.5 F (36.4 C) 98.4 F (36.9 C)   TempSrc:  Oral Oral   Resp: 23 23    Height:      Weight:  195 lb 3.2 oz (88.542 kg)    SpO2:  100%     I&O's:   Intake/Output Summary (Last 24 hours) at 09/29/15 [id number] Last data filed at 09/28/15 2359  Gross per 24 hour  Intake 723.38 ml  Output   1325 ml  Net -601.62 ml   TELEMETRY: Reviewed telemetry pt in NSR:     PHYSICAL EXAM General: Well developed, well nourished, in no acute distress Head: Eyes PERRLA, No xanthomas.   Normal cephalic and atramatic  Lungs:   Clear bilaterally to auscultation and percussion. Heart:   HRRR S1 S2 Pulses are 2+ & equal. Abdomen: Bowel sounds are positive, abdomen soft and non-tender without masses  Extremities:   No clubbing, cyanosis or edema.  DP +1 Neuro: Alert and oriented X 3. Psych:  Good affect, responds appropriately   LABS: Basic Metabolic Panel:  Recent Labs  09/27/15 1059 09/28/15 0525  NA 138 140  K 4.3 3.8  CL 106 104  CO2 23 26  GLUCOSE 224* 159*  BUN 16 16  CREATININE 1.31* 1.13  CALCIUM 9.5 9.3   Liver Function Tests:  Recent Labs  09/27/15 1825  AST 26  ALT 24  ALKPHOS 85  BILITOT 0.9  PROT 6.9  ALBUMIN 3.6   No results for input(s): LIPASE, AMYLASE in the last 72 hours. CBC:  Recent Labs  09/28/15 0525 09/28/15 2345  WBC 6.6 5.5  HGB 13.3 13.3  HCT 38.9* 38.3*  MCV 80.0 80.5  PLT 194 196   Cardiac Enzymes:  Recent Labs  09/27/15 1825 09/28/15 0014 09/28/15 0525  TROPONINI 0.05* 0.04* 0.04*   BNP: Invalid input(s): POCBNP D-Dimer: No results for input(s): DDIMER in the last 72 hours. Hemoglobin A1C:  Recent Labs  09/27/15 1825  HGBA1C 9.1*   Fasting Lipid Panel:  Recent Labs  09/28/15 0525  CHOL 238*  HDL 53  LDLCALC 158*  TRIG 137  CHOLHDL 4.5    Thyroid Function Tests:  Recent Labs  09/27/15 1825  TSH 4.454   Anemia Panel: No results for input(s): VITAMINB12, FOLATE, FERRITIN, TIBC, IRON, RETICCTPCT in the last 72 hours. Coag Panel:   Lab Results  Component Value Date   INR 0.98 09/27/2015   INR 1.03 05/09/2014    RADIOLOGY: Dg Chest 2 View  09/27/2015  CLINICAL DATA:  Shortness of breath and chest pain for days the EXAM: CHEST  2 VIEW COMPARISON:  05/09/2014 FINDINGS: Heart size is normal as is the vascular pattern. Coronary stent noted. Mild atelectatic change at both lung bases. No pleural effusion. IMPRESSION: No significant acute findings Electronically Signed   By: Skipper Cliche M.D.   On: 09/27/2015 11:54    ASSESSMENT AND PLAN  1. Chest pain - With typical and atypical features. Concerning for unstable angina in setting uncontrolled HTN and sinus tachycardia. History of ischemic cardiomyopathy with stent placement to mid LAD 04/2014. He also has diffuse nonobstructive disease. He has not follow-up with cardiology since last admission. He stopped taking all of his medication for the past 3 months except aspirin and Lantus. - Minimally elevated  troponin 04 x 2. Mildly elevated BNP. Sinus tachycardia noted on EKG. No acute changes. - Suspect his symptoms are due to worsening of coronary disease, subendocardial ischemia from hypertensive urgency and acute CHF due to medication noncompliance. TSH is normal.  - Will get echo - [place] Monday.  - Continue IV heparin, asa 81mg , coreg 25mg  BID, lipitor 80mg  andnitro gtt.   2. Ischemic cardiomyopathy with acute on chronic systolic CHF - Left ventricular function of 20-25% on last echocardiogram 04/2014. As above. 2D echo pending.  - UOP good. He is net neg 2.2L. - continue IV diuresis. Renal function improved.  3. DM - HbgA1C 9.1 - Will place on SSI.  - Hospitalist consult for management of uncontrolled DM  4. AKI - Daily BMET. Improved with  diuresis. Start ACE if renal function remains stable post cath.  5. HTN, Uncontrolled upon presentation - improved on BB. Will add Lopressor since BP still borderline elevated and still tachycardic.   6. Sinus tachycardia - Rate improved. Continue BB. Coreg dose maxed out.  Lopressor added yesterday to help with tachycardia.   7. Dyslipidemia - LDL not at goal and started high dose statin. Will need repeat FLP and ALT in 6 weeks.      Antonio Margarita, MD  09/29/2015  9:05 AM

## 2015-09-30 ENCOUNTER — Encounter (HOSPITAL_COMMUNITY): Payer: Self-pay | Admitting: Cardiovascular Disease

## 2015-09-30 ENCOUNTER — Encounter (HOSPITAL_COMMUNITY)
Admission: EM | Disposition: A | Discharge status: Home / Self Care | Payer: Self-pay | Source: Home / Self Care | Attending: Cardiology

## 2015-09-30 DIAGNOSIS — R778 Other specified abnormalities of plasma proteins: Secondary | ICD-10-CM | POA: Insufficient documentation

## 2015-09-30 DIAGNOSIS — E118 Type 2 diabetes mellitus with unspecified complications: Secondary | ICD-10-CM

## 2015-09-30 DIAGNOSIS — R7989 Other specified abnormal findings of blood chemistry: Secondary | ICD-10-CM

## 2015-09-30 DIAGNOSIS — R000 Tachycardia, unspecified: Secondary | ICD-10-CM

## 2015-09-30 DIAGNOSIS — Z794 Long term (current) use of insulin: Secondary | ICD-10-CM

## 2015-09-30 HISTORY — PX: CARDIAC CATHETERIZATION: SHX172

## 2015-09-30 LAB — BASIC METABOLIC PANEL
ANION GAP: 11 (ref 5–15)
BUN: 24 mg/dL — ABNORMAL HIGH (ref 6–20)
CHLORIDE: 101 mmol/L (ref 101–111)
CO2: 23 mmol/L (ref 22–32)
CREATININE: 1.31 mg/dL — AB (ref 0.61–1.24)
Calcium: 8.9 mg/dL (ref 8.9–10.3)
GFR calc non Af Amer: 56 mL/min — ABNORMAL LOW (ref >60)
Glucose, Bld: 237 mg/dL — ABNORMAL HIGH (ref 65–99)
POTASSIUM: 3.5 mmol/L (ref 3.5–5.1)
SODIUM: 135 mmol/L (ref 135–145)

## 2015-09-30 LAB — GLUCOSE, CAPILLARY
GLUCOSE-CAPILLARY: 223 mg/dL — AB (ref 65–99)
GLUCOSE-CAPILLARY: 220 mg/dL — AB (ref 65–99)
GLUCOSE-CAPILLARY: 286 mg/dL — AB (ref 65–99)
Glucose-Capillary: 223 mg/dL — ABNORMAL HIGH (ref 65–99)

## 2015-09-30 LAB — CBC
HCT: 38.4 % — ABNORMAL LOW (ref 39.0–52.0)
Hemoglobin: 12.9 g/dL — ABNORMAL LOW (ref 13.0–17.0)
MCH: 26.9 pg (ref 26.0–34.0)
MCHC: 33.6 g/dL (ref 30.0–36.0)
MCV: 80 fL (ref 78.0–100.0)
PLATELETS: 213 10*3/uL (ref 150–400)
RBC: 4.8 MIL/uL (ref 4.22–5.81)
RDW: 14 % (ref 11.5–15.5)
WBC: 5.3 10*3/uL (ref 4.0–10.5)

## 2015-09-30 LAB — HEPARIN LEVEL (UNFRACTIONATED): Heparin Unfractionated: 0.55 IU/mL (ref 0.30–0.70)

## 2015-09-30 SURGERY — LEFT HEART CATH AND CORONARY ANGIOGRAPHY

## 2015-09-30 MED ORDER — HEPARIN SODIUM (PORCINE) 1000 UNIT/ML IJ SOLN
INTRAMUSCULAR | Status: AC
  Filled 2015-09-30: qty 1

## 2015-09-30 MED ORDER — ISOSORBIDE MONONITRATE ER 30 MG PO TB24
30.0000 mg | ORAL_TABLET | Freq: Every day | ORAL | Status: DC
  Administered 2015-09-30 – 2015-10-02 (×3): 30 mg via ORAL
  Filled 2015-09-30 (×3): qty 1

## 2015-09-30 MED ORDER — SPIRONOLACTONE 25 MG PO TABS
25.0000 mg | ORAL_TABLET | Freq: Every day | ORAL | Status: DC
  Administered 2015-09-30 – 2015-10-02 (×3): 25 mg via ORAL
  Filled 2015-09-30 (×3): qty 1

## 2015-09-30 MED ORDER — SODIUM CHLORIDE 0.9 % IV SOLN: INTRAVENOUS | Status: AC

## 2015-09-30 MED ORDER — VERAPAMIL HCL 2.5 MG/ML IV SOLN
INTRAVENOUS | Status: DC | PRN
  Administered 2015-09-30: 10 mL via INTRA_ARTERIAL

## 2015-09-30 MED ORDER — LIDOCAINE HCL (PF) 1 % IJ SOLN
INTRAMUSCULAR | Status: DC | PRN
  Administered 2015-09-30: 3 mL

## 2015-09-30 MED ORDER — SODIUM CHLORIDE 0.9 % IV SOLN: 250.0000 mL | INTRAVENOUS | Status: DC | PRN

## 2015-09-30 MED ORDER — HEPARIN (PORCINE) IN NACL 2-0.9 UNIT/ML-% IJ SOLN
INTRAMUSCULAR | Status: AC
  Filled 2015-09-30: qty 1500

## 2015-09-30 MED ORDER — HEPARIN SODIUM (PORCINE) 1000 UNIT/ML IJ SOLN
INTRAMUSCULAR | Status: DC | PRN
  Administered 2015-09-30: 4500 [IU] via INTRAVENOUS

## 2015-09-30 MED ORDER — HYDRALAZINE HCL 25 MG PO TABS
25.0000 mg | ORAL_TABLET | Freq: Three times a day (TID) | ORAL | Status: DC
  Administered 2015-09-30 – 2015-10-02 (×7): 25 mg via ORAL
  Filled 2015-09-30 (×7): qty 1

## 2015-09-30 MED ORDER — FENTANYL CITRATE (PF) 100 MCG/2ML IJ SOLN
INTRAMUSCULAR | Status: DC | PRN
  Administered 2015-09-30: 50 ug via INTRAVENOUS

## 2015-09-30 MED ORDER — INSULIN GLARGINE 100 UNIT/ML ~~LOC~~ SOLN
15.0000 [IU] | Freq: Every day | SUBCUTANEOUS | Status: DC
  Administered 2015-10-01: 15 [IU] via SUBCUTANEOUS
  Filled 2015-09-30 (×2): qty 0.15

## 2015-09-30 MED ORDER — SODIUM CHLORIDE 0.9% FLUSH: 3.0000 mL | INTRAVENOUS | Status: DC | PRN

## 2015-09-30 MED ORDER — HEPARIN (PORCINE) IN NACL 2-0.9 UNIT/ML-% IJ SOLN
INTRAMUSCULAR | Status: DC | PRN
  Administered 2015-09-30: 10:00:00

## 2015-09-30 MED ORDER — MIDAZOLAM HCL 2 MG/2ML IJ SOLN
INTRAMUSCULAR | Status: DC | PRN
  Administered 2015-09-30: 2 mg via INTRAVENOUS

## 2015-09-30 MED ORDER — LIDOCAINE HCL (PF) 1 % IJ SOLN
INTRAMUSCULAR | Status: AC
  Filled 2015-09-30: qty 30

## 2015-09-30 MED ORDER — VERAPAMIL HCL 2.5 MG/ML IV SOLN
INTRAVENOUS | Status: AC
  Filled 2015-09-30: qty 2

## 2015-09-30 MED ORDER — FENTANYL CITRATE (PF) 100 MCG/2ML IJ SOLN
INTRAMUSCULAR | Status: AC
  Filled 2015-09-30: qty 2

## 2015-09-30 MED ORDER — SODIUM CHLORIDE 0.9% FLUSH
3.0000 mL | Freq: Two times a day (BID) | INTRAVENOUS | Status: DC
  Administered 2015-09-30 – 2015-10-02 (×4): 3 mL via INTRAVENOUS

## 2015-09-30 MED ORDER — IOHEXOL 350 MG/ML SOLN
INTRAVENOUS | Status: DC | PRN
  Administered 2015-09-30: 55 mL via INTRA_ARTERIAL

## 2015-09-30 MED ORDER — MIDAZOLAM HCL 2 MG/2ML IJ SOLN
INTRAMUSCULAR | Status: AC
Start: 2015-09-30 — End: 2015-09-30
  Filled 2015-09-30: qty 2

## 2015-09-30 MED ORDER — IVABRADINE HCL 5 MG PO TABS
5.0000 mg | ORAL_TABLET | Freq: Two times a day (BID) | ORAL | Status: DC
  Administered 2015-09-30 – 2015-10-02 (×4): 5 mg via ORAL
  Filled 2015-09-30 (×5): qty 1

## 2015-09-30 SURGICAL SUPPLY — 10 items
CATH INFINITI 5 FR JL3.5 (CATHETERS) ×2 IMPLANT
CATH INFINITI 5FR ANG PIGTAIL (CATHETERS) ×2 IMPLANT
CATH INFINITI JR4 5F (CATHETERS) ×2 IMPLANT
DEVICE RAD COMP TR BAND LRG (VASCULAR PRODUCTS) ×2 IMPLANT
GLIDESHEATH SLEND SS 6F .021 (SHEATH) ×2 IMPLANT
KIT HEART LEFT (KITS) ×2 IMPLANT
PACK CARDIAC CATHETERIZATION (CUSTOM PROCEDURE TRAY) ×2 IMPLANT
TRANSDUCER W/STOPCOCK (MISCELLANEOUS) ×2 IMPLANT
TUBING CIL FLEX 10 FLL-RA (TUBING) ×2 IMPLANT
WIRE SAFE-T 1.5MM-J .035X260CM (WIRE) ×2 IMPLANT

## 2015-09-30 NOTE — Progress Notes (Addendum)
Patient Name: Antonio Guerra Date of Encounter: 09/30/2015   SUBJECTIVE  Feeling well. No chest pain, sob or palpitations. For cath today.   CURRENT MEDS . aspirin EC  81 mg Oral Daily  . atorvastatin  80 mg Oral q1800  . carvedilol  25 mg Oral BID WC  . folic acid  1 mg Oral Daily  . furosemide  40 mg Intravenous BID  . insulin aspart  0-9 Units Subcutaneous TID WC  . insulin glargine  15 Units Subcutaneous Daily  . metoprolol tartrate  12.5 mg Oral BID  . sodium chloride flush  3 mL Intravenous Q12H  . sodium chloride flush  3 mL Intravenous Q12H    OBJECTIVE  Filed Vitals:   09/29/15 2355 09/29/15 2356 09/30/15 0500 09/30/15 0835  BP:   126/88   Pulse:   86 91  Temp: 97.2 F (36.2 C)  98.5 F (36.9 C) 98.1 F (36.7 C)  TempSrc: Oral  Oral Oral  Resp:      Height:      Weight:   192 lb 14.4 oz (87.499 kg)   SpO2:  98% 99% 99%    Intake/Output Summary (Last 24 hours) at 09/30/15 0851 Last data filed at 09/30/15 0835  Gross per 24 hour  Intake 1405.03 ml  Output   2175 ml  Net -769.97 ml   Filed Weights   09/28/15 0453 09/29/15 0456 09/30/15 0500  Weight: 196 lb 8 oz (89.132 kg) 195 lb 3.2 oz (88.542 kg) 192 lb 14.4 oz (87.499 kg)    PHYSICAL EXAM  General: Pleasant, NAD. Neuro: Alert and oriented X 3. Moves all extremities spontaneously. Psych: Normal affect. HEENT:  Normal  Neck: Supple without bruits or JVD. Lungs:  Resp regular and unlabored. Diminished breath sound bibasilar.  Heart: RRR no s3, s4, or murmurs. Abdomen: Soft, non-tender, non-distended, BS + x 4.  Extremities: No clubbing, cyanosis or edema. DP/PT/Radials 2+ and equal bilaterally.  Accessory Clinical Findings  CBC  Recent Labs  09/28/15 2345 09/30/15 0348  WBC 5.5 5.3  HGB 13.3 12.9*  HCT 38.3* 38.4*  MCV 80.5 80.0  PLT 196 [id number]   Basic Metabolic Panel  Recent Labs  09/29/15 1212 09/30/15 0514  NA 132* 135  K 4.1 3.5  CL 96* 101  CO2 22 23  GLUCOSE 313* 237*    BUN 22* 24*  CREATININE 1.38* 1.31*  CALCIUM 9.2 8.9   Liver Function Tests  Recent Labs  09/27/15 1825  AST 26  ALT 24  ALKPHOS 85  BILITOT 0.9  PROT 6.9  ALBUMIN 3.6   No results for input(s): LIPASE, AMYLASE in the last 72 hours. Cardiac Enzymes  Recent Labs  09/27/15 1825 09/28/15 0014 09/28/15 0525  TROPONINI 0.05* 0.04* 0.04*   BNP Invalid input(s): POCBNP D-Dimer No results for input(s): DDIMER in the last 72 hours. Hemoglobin A1C  Recent Labs  09/27/15 1825  HGBA1C 9.1*   Fasting Lipid Panel  Recent Labs  09/28/15 0525  CHOL 238*  HDL 53  LDLCALC 158*  TRIG 137  CHOLHDL 4.5   Thyroid Function Tests  Recent Labs  09/27/15 1825  TSH 4.454    TELE  Sinus rhythm at rate mostly in 90s  Radiology/Studies  Dg Chest 2 View  09/27/2015  CLINICAL DATA:  Shortness of breath and chest pain for days the EXAM: CHEST  2 VIEW COMPARISON:  05/09/2014 FINDINGS: Heart size is normal as is the vascular pattern. Coronary stent noted. Mild atelectatic  change at both lung bases. No pleural effusion. IMPRESSION: No significant acute findings Electronically Signed   By: Skipper Cliche M.D.   On: 09/27/2015 11:54   Echo 09/29/15 LV EF: 15% - 20%  ------------------------------------------------------------------- Indications: Cardiomyopathy - ischemic 414.8.  ------------------------------------------------------------------- History: PMH: Coronary artery disease. Congestive heart failure. Stroke. Risk factors: Hypertension. Diabetes mellitus. Dyslipidemia.  ------------------------------------------------------------------- Study Conclusions  - Left ventricle: The cavity size was normal. There was moderate  concentric hypertrophy. Systolic function was severely reduced.  The estimated ejection fraction was in the range of 15% to 20%.  Akinesis of the basal-midanteroseptal myocardium. Features are  consistent with a  pseudonormal left ventricular filling pattern,  with concomitant abnormal relaxation and increased filling  pressure (grade 2 diastolic dysfunction). - Aortic valve: There was trivial regurgitation. - Mitral valve: There was severe regurgitation. - Left atrium: The atrium was mildly dilated.  ASSESSMENT AND PLAN 1. Chest pain - With typical and atypical features. Concerning for unstable angina in setting uncontrolled HTN and sinus tachycardia. History of ischemic cardiomyopathy with stent placement to mid LAD 04/2014. He also has diffuse nonobstructive disease. He has not follow-up with cardiology since last admission. He stopped taking all of his medication for the past 3 months except aspirin and Lantus. - Minimally elevated troponin 04 x 2. Mildly elevated BNP. Sinus tachycardia noted on EKG. No acute changes. - Suspect his symptoms are due to worsening of coronary disease, subendocardial ischemia from hypertensive urgency and acute CHF due to medication noncompliance. TSH is normal.  - Continue IV heparin, asa 81mg , coreg 25mg  BID, lipitor 80mg  andnitro gtt.  - For L heart cath today. The patient understands that risks include but are not limited to stroke (1 in 1000), death (1 in 33), kidney failure [usually temporary] (1 in 500), bleeding (1 in 200), allergic reaction [possibly serious] (1 in 200), and agrees to proceed.  - Echo this admission showed LV EF of 15-20%, grade 2 DD, trivial aortic regur, severe MR, mildly dilated LA.   2. Ischemic cardiomyopathy with acute on chronic systolic CHF - Left ventricular function of 20-25% on last echocardiogram 04/2014.  2D echo this admission showed ef of 15-20%. For cath today. Likely EP consult post cath for AICD consideration.  - UOP good. He is net neg3L with 11lb weight loss.  - continue IV diuresis. Renal function improving.  3. DM - HbgA1C 9.1 - Appreciate Hospitalist's input for management of uncontrolled DM  4. AKI -  Daily BMET. Improved with diuresis. Start ACE if renal function remains stable post cath.  5. HTN, Uncontrolled upon presentation - improved on BB. Will add Lopressor since BP still borderline elevated and still tachycardic.   6. Sinus tachycardia - Rate improved. Continue BB. Coreg dose maxed out.Lopressor added to help with tachycardia. Rate stable in 90s.   7. Dyslipidemia - LDL not at goal and started high dose statin. Will need repeat FLP and ALT in 6 weeks.   Signed, Leanor Kail PA-C Pager [phone]   The patient was seen, examined and discussed with Bhagat,Bhavinkumar PA-C and I agree with the above.   65 year old male with known coronary artery disease with PCI to the LAD in October 2015 and known ischemic cardiomyopathy previously 20-25% who didn't follow after admission in 2015. Patient right now presented with worsening chest pain minimal troponin elevation with maximum 0.04 and persistent and worsening LVEF currently 15-20%. He is scheduled for cardiac cath today if no intervention needed we will ask EP to evaluate for  ICD placement. Hypertension was significantly abnormal on admission, currently better controlled, however she is on 2 beta blocker will continue only carvedilol 25 mg by mouth twice a day when necessary avoiding ACEI/ARB as she has acute renal failure. We will try imdur/hydralazine combination and ivabradin for additional HR control. We will add Spironolactone 25 mg daily.  Dorothy Spark 09/30/2015

## 2015-09-30 NOTE — Interval H&P Note (Signed)
History and Physical Interval Note:  09/30/2015 9:45 AM  Antonio Guerra  has presented today for cardiac cath with the diagnosis of unstable angina.   The various methods of treatment have been discussed with the patient and family. After consideration of risks, benefits and other options for treatment, the patient has consented to  Procedure(s): Left Heart Cath and Coronary Angiography (N/A) as a surgical intervention .  The patient's history has been reviewed, patient examined, no change in status, stable for surgery.  I have reviewed the patient's chart and labs.  Questions were answered to the patient's satisfaction.     MCALHANY,CHRISTOPHER

## 2015-09-30 NOTE — Progress Notes (Signed)
Patient reported that he urinated in the toilet about an hour ago.  RN educated patient on using urinal when he needed to urinate so we cold continue to track his urine output.  RN also explained to patient that monitoring his output was a doctor's order.  Patient stated understanding.

## 2015-09-30 NOTE — Progress Notes (Signed)
Inpatient Diabetes Program Recommendations  AACE/ADA: New Consensus Statement on Inpatient Glycemic Control (2015)  Target Ranges:  Prepandial:   less than 140 mg/dL      Peak postprandial:   less than 180 mg/dL (1-2 hours)      Critically ill patients:  140 - 180 mg/dL  Results for JEVONTA, OKRAY (MRN [id number]) as of 09/30/2015 10:11  Ref. Range 09/29/2015 08:01 09/29/2015 11:42 09/29/2015 16:45 09/29/2015 22:28 09/30/2015 07:49  Glucose-Capillary Latest Ref Range: 65-99 mg/dL 200 (H) 286 (H) 265 (H) 291 (H) 223 (H)   Review of Glycemic Control  Diabetes history:DM 2 Outpatient Diabetes medications: lantus Insulin 20 units & AM and 25 units PM Current orders for Inpatient glycemic control: Lantus 15 units q hs & Sensitive SSI (0-9).  Inpatient Diabetes Program Recommendations:  No recommendations @ this time. Noted Lantus insulin ordered to start today 3/13. MD. Pt. Needs CBG meter @ time of discharge. Please use order [id number].  Spoke with patient concerning A1c 9.1. Pt. States he is currently using his daughter's meter and misses insulin on occasion due to difficulty obtaining prescriptions. Reviewed meaning of A1c and importance of glycemic mgt.  Nani Gasser Tearah Saulsbury, RN, MSN, CDE Inpatient Diabetes Coordinator Pager # [phone] 09/30/2015 10:24 AM

## 2015-09-30 NOTE — H&P (View-Only) (Signed)
SUBJECTIVE:  No complaints  OBJECTIVE:   Vitals:   Filed Vitals:   09/29/15 0200 09/29/15 0456 09/29/15 0800 09/29/15 0837  BP: 123/83 131/96  135/92  Pulse:  93  96  Temp:  97.5 F (36.4 C) 98.4 F (36.9 C)   TempSrc:  Oral Oral   Resp: 23 23    Height:      Weight:  195 lb 3.2 oz (88.542 kg)    SpO2:  100%     I&O's:   Intake/Output Summary (Last 24 hours) at 09/29/15 [id number] Last data filed at 09/28/15 2359  Gross per 24 hour  Intake 723.38 ml  Output   1325 ml  Net -601.62 ml   TELEMETRY: Reviewed telemetry pt in NSR:     PHYSICAL EXAM General: Well developed, well nourished, in no acute distress Head: Eyes PERRLA, No xanthomas.   Normal cephalic and atramatic  Lungs:   Clear bilaterally to auscultation and percussion. Heart:   HRRR S1 S2 Pulses are 2+ & equal. Abdomen: Bowel sounds are positive, abdomen soft and non-tender without masses  Extremities:   No clubbing, cyanosis or edema.  DP +1 Neuro: Alert and oriented X 3. Psych:  Good affect, responds appropriately   LABS: Basic Metabolic Panel:  Recent Labs  09/27/15 1059 09/28/15 0525  NA 138 140  K 4.3 3.8  CL 106 104  CO2 23 26  GLUCOSE 224* 159*  BUN 16 16  CREATININE 1.31* 1.13  CALCIUM 9.5 9.3   Liver Function Tests:  Recent Labs  09/27/15 1825  AST 26  ALT 24  ALKPHOS 85  BILITOT 0.9  PROT 6.9  ALBUMIN 3.6   No results for input(s): LIPASE, AMYLASE in the last 72 hours. CBC:  Recent Labs  09/28/15 0525 09/28/15 2345  WBC 6.6 5.5  HGB 13.3 13.3  HCT 38.9* 38.3*  MCV 80.0 80.5  PLT 194 196   Cardiac Enzymes:  Recent Labs  09/27/15 1825 09/28/15 0014 09/28/15 0525  TROPONINI 0.05* 0.04* 0.04*   BNP: Invalid input(s): POCBNP D-Dimer: No results for input(s): DDIMER in the last 72 hours. Hemoglobin A1C:  Recent Labs  09/27/15 1825  HGBA1C 9.1*   Fasting Lipid Panel:  Recent Labs  09/28/15 0525  CHOL 238*  HDL 53  LDLCALC 158*  TRIG 137  CHOLHDL 4.5    Thyroid Function Tests:  Recent Labs  09/27/15 1825  TSH 4.454   Anemia Panel: No results for input(s): VITAMINB12, FOLATE, FERRITIN, TIBC, IRON, RETICCTPCT in the last 72 hours. Coag Panel:   Lab Results  Component Value Date   INR 0.98 09/27/2015   INR 1.03 05/09/2014    RADIOLOGY: Dg Chest 2 View  09/27/2015  CLINICAL DATA:  Shortness of breath and chest pain for days the EXAM: CHEST  2 VIEW COMPARISON:  05/09/2014 FINDINGS: Heart size is normal as is the vascular pattern. Coronary stent noted. Mild atelectatic change at both lung bases. No pleural effusion. IMPRESSION: No significant acute findings Electronically Signed   By: Skipper Cliche M.D.   On: 09/27/2015 11:54    ASSESSMENT AND PLAN  1. Chest pain - With typical and atypical features. Concerning for unstable angina in setting uncontrolled HTN and sinus tachycardia. History of ischemic cardiomyopathy with stent placement to mid LAD 04/2014. He also has diffuse nonobstructive disease. He has not follow-up with cardiology since last admission. He stopped taking all of his medication for the past 3 months except aspirin and Lantus. - Minimally elevated  troponin 04 x 2. Mildly elevated BNP. Sinus tachycardia noted on EKG. No acute changes. - Suspect his symptoms are due to worsening of coronary disease, subendocardial ischemia from hypertensive urgency and acute CHF due to medication noncompliance. TSH is normal.  - Will get echo - Plancath Monday.  - Continue IV heparin, asa 81mg , coreg 25mg  BID, lipitor 80mg  andnitro gtt.   2. Ischemic cardiomyopathy with acute on chronic systolic CHF - Left ventricular function of 20-25% on last echocardiogram 04/2014. As above. 2D echo pending.  - UOP good. He is net neg 2.2L. - continue IV diuresis. Renal function improved.  3. DM - HbgA1C 9.1 - Will place on SSI.  - Hospitalist consult for management of uncontrolled DM  4. AKI - Daily BMET. Improved with  diuresis. Start ACE if renal function remains stable post cath.  5. HTN, Uncontrolled upon presentation - improved on BB. Will add Lopressor since BP still borderline elevated and still tachycardic.   6. Sinus tachycardia - Rate improved. Continue BB. Coreg dose maxed out.  Lopressor added yesterday to help with tachycardia.   7. Dyslipidemia - LDL not at goal and started high dose statin. Will need repeat FLP and ALT in 6 weeks.      Sueanne Margarita, MD  09/29/2015  9:05 AM

## 2015-09-30 NOTE — Progress Notes (Signed)
MEDICAL CONSULTATION PROGRESS NOTE  Antonio Guerra [id number] DOB: 1951-03-09 DOA: 09/27/2015 PCP: No primary care provider on file. Outpatient Specialists:    LOS: 3 days   Brief Narrative: 65 year old male with known coronary artery disease, diabetes, noncompliant with his medications, was admitted to the cardiology service for chest pain. [facility] was asked to follow for diabetes mellitus  Assessment & Plan: Active Problems:   Unstable angina (HCC)   Ischemic cardiomyopathy   Hyperlipidemia   Coronary artery disease   Hypertensive urgency   Acute on chronic combined systolic (congestive) and diastolic (congestive) heart failure (HCC)   Type 2 diabetes mellitus (HCC)   Elevated troponin   Diabetes mellitus type 2 - uncontrolled - Patient refused his insulin last night because he was Levemir instead of Lantus which he takes at home. - Fasting glucose 223 this morning, resume Lantus, discontinue Levaquin - Continue sliding scale insulin  Dyslipidemia - Stable, continue atorvastatin  Hypertension - Stable, on Coreg  Chest pain - Management per cardiology, cath today    Antimicrobials:  None    Subjective: - no chest pain awaiting cath   Objective: Filed Vitals:   09/30/15 1013 09/30/15 1018 09/30/15 1023 09/30/15 1130  BP: 121/83 121/85 129/90   Pulse: 88 89 87   Temp:    98 F (36.7 C)  TempSrc:    Oral  Resp: 15 16 12    Height:      Weight:      SpO2: 99% 100% 100%     Intake/Output Summary (Last 24 hours) at 09/30/15 1212 Last data filed at 09/30/15 0835  Gross per 24 hour  Intake 1045.03 ml  Output   1950 ml  Net -904.97 ml   Filed Weights   09/28/15 0453 09/29/15 0456 09/30/15 0500  Weight: 89.132 kg (196 lb 8 oz) 88.542 kg (195 lb 3.2 oz) 87.499 kg (192 lb 14.4 oz)    Examination: BP 129/90 mmHg  Pulse 87  Temp(Src) 98 F (36.7 C) (Oral)  Resp 12  Ht 6\' 1"  (1.854 m)  Wt 87.499 kg (192 lb 14.4 oz)  BMI 25.46 kg/m2  SpO2  100%  GENERAL: NAD  HEENT: head NCAT, no scleral icterus.  LUNGS: Clear to auscultation. No wheezing or crackles  HEART: Regular rate and rhythm without murmur. 2+ pulses, no JVD, no peripheral edema  ABDOMEN: Soft, nontender, and nondistended. Positive bowel sounds.   EXTREMITIES: Without any cyanosis or clubbing  NEUROLOGIC: non focal    Data Reviewed: I have personally reviewed following labs and imaging studies  CBC:  Recent Labs Lab 09/27/15 1059 09/28/15 0013 09/28/15 0525 09/28/15 2345 09/30/15 0348  WBC 5.5 6.8 6.6 5.5 5.3  HGB 14.2 13.5 13.3 13.3 12.9*  HCT 41.4 39.2 38.9* 38.3* 38.4*  MCV 80.2 79.5 80.0 80.5 80.0  PLT 223 207 194 196 [id number]   Basic Metabolic Panel:  Recent Labs Lab 09/27/15 1059 09/28/15 0525 09/29/15 1212 09/30/15 0514  NA 138 140 132* 135  K 4.3 3.8 4.1 3.5  CL 106 104 96* 101  CO2 23 26 22 23   GLUCOSE 224* 159* 313* 237*  BUN 16 16 22* 24*  CREATININE 1.31* 1.13 1.38* 1.31*  CALCIUM 9.5 9.3 9.2 8.9   GFR: Estimated Creatinine Clearance: 65.2 mL/min (by C-G formula based on Cr of 1.31). Liver Function Tests:  Recent Labs Lab 09/27/15 1825  AST 26  ALT 24  ALKPHOS 85  BILITOT 0.9  PROT 6.9  ALBUMIN 3.6   No results for  input(s): LIPASE, AMYLASE in the last 168 hours. No results for input(s): AMMONIA in the last 168 hours. Coagulation Profile:  Recent Labs Lab 09/27/15 1059 09/29/15 1212  INR 0.98 1.09   Cardiac Enzymes:  Recent Labs Lab 09/27/15 1825 09/28/15 0014 09/28/15 0525  TROPONINI 0.05* 0.04* 0.04*   BNP (last 3 results) No results for input(s): PROBNP in the last 8760 hours. HbA1C:  Recent Labs  09/27/15 1825  HGBA1C 9.1*   CBG:  Recent Labs Lab 09/29/15 1142 09/29/15 1645 09/29/15 2228 09/30/15 0749 09/30/15 1131  GLUCAP 286* 265* 291* 223* 223*   Lipid Profile:  Recent Labs  09/28/15 0525  CHOL 238*  HDL 53  LDLCALC 158*  TRIG 137  CHOLHDL 4.5   Thyroid Function  Tests:  Recent Labs  09/27/15 1825  TSH 4.454   Anemia Panel: No results for input(s): VITAMINB12, FOLATE, FERRITIN, TIBC, IRON, RETICCTPCT in the last 72 hours. Urine analysis:    Component Value Date/Time   COLORURINE YELLOW 09/27/2015 1210   APPEARANCEUR TURBID* 09/27/2015 1210   LABSPEC 1.021 09/27/2015 1210   PHURINE 5.0 09/27/2015 1210   GLUCOSEU 250* 09/27/2015 1210   HGBUR TRACE* 09/27/2015 1210   BILIRUBINUR NEGATIVE 09/27/2015 1210   KETONESUR NEGATIVE 09/27/2015 1210   PROTEINUR 100* 09/27/2015 1210   NITRITE NEGATIVE 09/27/2015 1210   LEUKOCYTESUR NEGATIVE 09/27/2015 1210   Sepsis Labs: Invalid input(s): PROCALCITONIN, LACTICIDVEN  Recent Results (from the past 240 hour(s))  MRSA PCR Screening     Status: None   Collection Time: 09/27/15  8:27 PM  Result Value Ref Range Status   MRSA by PCR NEGATIVE NEGATIVE Final    Comment:        The GeneXpert MRSA Assay (FDA approved for NASAL specimens only), is one component of a comprehensive MRSA colonization surveillance program. It is not intended to diagnose MRSA infection nor to guide or monitor treatment for MRSA infections.       Radiology Studies: No results found.   Scheduled Meds: . aspirin EC  81 mg Oral Daily  . atorvastatin  80 mg Oral q1800  . carvedilol  25 mg Oral BID WC  . folic acid  1 mg Oral Daily  . furosemide  40 mg Intravenous BID  . hydrALAZINE  25 mg Oral 3 times per day  . insulin aspart  0-9 Units Subcutaneous TID WC  . insulin glargine  15 Units Subcutaneous Daily  . isosorbide mononitrate  30 mg Oral Daily  . ivabradine  5 mg Oral BID WC  . sodium chloride flush  3 mL Intravenous Q12H  . spironolactone  25 mg Oral Daily   Continuous Infusions: . sodium chloride      Marzetta Board, MD, PhD [facility] Pager [phone]  If 7PM-7AM, please contact night-coverage [email or web address] Password [email or web address] 09/30/2015, 12:12 PM

## 2015-09-30 NOTE — Research (Signed)
REDS_0  Informed Consent   Subject Name: Antonio Guerra  Subject met inclusion and exclusion criteria.  The informed consent form, study requirements and expectations were reviewed with the subject and questions and concerns were addressed prior to the signing of the consent form.  The subject verbalized understanding of the trail requirements.  The subject agreed to participate in the REDS_1  trial and signed the informed consent.  The informed consent was obtained prior to performance of any protocol-specific procedures for the subject.  A copy of the signed informed consent was given to the subject and a copy was placed in the subject's medical record.  [name] 09/30/2015, 17:58

## 2015-09-30 NOTE — Plan of Care (Signed)
Problem: Phase I Progression Outcomes Goal: Anginal pain relieved Outcome: Progressing Patient had one episode of chest pain thus far this shift.  Chest pain relieved with titration of IV nitroglycerin x1.

## 2015-09-30 NOTE — Progress Notes (Signed)
ANTICOAGULATION CONSULT NOTE - Follow Up Consult  Pharmacy Consult for heparin Indication: chest pain/ACS  No Known Allergies  Patient Measurements: Height: 6\' 1"  (185.4 cm) Weight: 192 lb 14.4 oz (87.499 kg) IBW/kg (Calculated) : 79.9 Heparin Dosing Weight:89 kg  Vital Signs: Temp: 98.1 F (36.7 C) (03/13 0835) Temp Source: Oral (03/13 0835) BP: 126/88 mmHg (03/13 0500) Pulse Rate: 91 (03/13 0835)  Labs:  Recent Labs  09/27/15 1059 09/27/15 1825  09/28/15 0014 09/28/15 0525 09/28/15 1503 09/28/15 2345 09/29/15 1212 09/30/15 0348 09/30/15 0514  HGB 14.2  --   < >  --  13.3  --  13.3  --  12.9*  --   HCT 41.4  --   < >  --  38.9*  --  38.3*  --  38.4*  --   PLT 223  --   < >  --  194  --  196  --  213  --   LABPROT 13.2  --   --   --   --   --   --  14.3  --   --   INR 0.98  --   --   --   --   --   --  1.09  --   --   HEPARINUNFRC  --   --   < >  --  0.38 0.81* 0.64  --  0.55  --   CREATININE 1.31*  --   --   --  1.13  --   --  1.38*  --  1.31*  TROPONINI  --  0.05*  --  0.04* 0.04*  --   --   --   --   --   < > = values in this interval not displayed.  Estimated Creatinine Clearance: 65.2 mL/min (by C-G formula based on Cr of 1.31).   Medications:  Infusions:  . sodium chloride    . sodium chloride 10 mL/hr at 09/30/15 0544  . heparin 1,050 Units/hr (09/28/15 1721)  . nitroGLYCERIN 10 mcg/min (09/29/15 2032)    Assessment: 65 yo man admitted 09/27/2015 for SOB and CP, concerning for unstable angina. Stopped taking meds except for ASA & Lantus ~3 months ago. Pharmacy consulted to dose heparin. Planned cath Monday.   On heparin for UA. Last HL remains therapeutic at 0.55. Hgb stable at 12.9, plts wnl. No s/s of bleed.  Goal of Therapy:  Heparin level 0.3-0.7 units/ml Monitor platelets by anticoagulation protocol: Yes   Plan:  Continue heparin gtt at 1,050 units/hr  Monitor daily HL, CBC, s/s of bleed F/U cath (1030)

## 2015-10-01 DIAGNOSIS — Z91148 Patient's other noncompliance with medication regimen for other reason: Secondary | ICD-10-CM | POA: Insufficient documentation

## 2015-10-01 DIAGNOSIS — Z9114 Patient's other noncompliance with medication regimen: Secondary | ICD-10-CM | POA: Insufficient documentation

## 2015-10-01 LAB — GLUCOSE, CAPILLARY
Glucose-Capillary: 203 mg/dL — ABNORMAL HIGH (ref 65–99)
Glucose-Capillary: 253 mg/dL — ABNORMAL HIGH (ref 65–99)
Glucose-Capillary: 218 mg/dL — ABNORMAL HIGH (ref 65–99)

## 2015-10-01 MED ORDER — INSULIN GLARGINE 100 UNIT/ML ~~LOC~~ SOLN
15.0000 [IU] | Freq: Two times a day (BID) | SUBCUTANEOUS | Status: DC
  Administered 2015-10-01: 15 [IU] via SUBCUTANEOUS
  Filled 2015-10-01 (×2): qty 0.15

## 2015-10-01 NOTE — Plan of Care (Signed)
Problem: Phase III Progression Outcomes Goal: Vascular site scale level 0 - I Vascular Site Scale Level 0: No bruising/bleeding/hematoma Level I (Mild): Bruising/Ecchymosis, minimal bleeding/ooozing, palpable hematoma < 3 cm Level II (Moderate): Bleeding not affecting hemodynamic parameters, pseudoaneurysm, palpable hematoma > 3 cm Level III (Severe) Bleeding which affects hemodynamic parameters or retroperitoneal hemorrhage  Outcome: Completed/Met Date Met:  10/01/15 Level 0

## 2015-10-01 NOTE — Progress Notes (Signed)
Inpatient Diabetes Program Recommendations  AACE/ADA: New Consensus Statement on Inpatient Glycemic Control (2015)  Target Ranges:  Prepandial:   less than 140 mg/dL      Peak postprandial:   less than 180 mg/dL (1-2 hours)      Critically ill patients:  140 - 180 mg/dL  Results for JUELLZ, PICARIELLO (MRN [id number]) as of 10/01/2015 09:40  Ref. Range 09/30/2015 11:31 09/30/2015 16:50 09/30/2015 21:18 10/01/2015 07:44  Glucose-Capillary Latest Ref Range: 65-99 mg/dL 223 (H) 220 (H) 286 (H) 203 (H)   Review of Glycemic Control  Diabetes history: DM2 Outpatient Diabetes medications: lantus Insulin 20 units & AM and 25 units PM Current orders for Inpatient glycemic control: Lantus 15 units daily & Novolog Sensitive SSI (0-9).  Inpatient Diabetes Program Recommendations:  Noted due to procedure, pt. Did not receive Lantus insulin yesterday and will receive first dose this a.m.   Please consider  1) increase Lantus to 15 units a.m & p.m. (75% home insulin dose)  2)Pt. Needs new CBG meter at discharge (order [id number])  Nani Gasser. Skylene Deremer, RN, MSN, CDE Inpatient Glycemic Control Team Team Pager [phone] (8am-5pm) 10/01/2015 9:44 AM

## 2015-10-01 NOTE — Progress Notes (Signed)
Pt educated about new medications today. PT has not c/o any pain this afternoon. PRN tylenol given this am for back pain. Pt tolerated Well. Pt received evening dose of lasix, and is resting well

## 2015-10-01 NOTE — Progress Notes (Signed)
Patient Name: Antonio Guerra Date of Encounter: 10/01/2015   SUBJECTIVE  Feeling well. No chest pain, sob or palpitations.   CURRENT MEDS . aspirin EC  81 mg Oral Daily  . atorvastatin  80 mg Oral q1800  . carvedilol  25 mg Oral BID WC  . folic acid  1 mg Oral Daily  . furosemide  40 mg Intravenous BID  . hydrALAZINE  25 mg Oral 3 times per day  . insulin aspart  0-9 Units Subcutaneous TID WC  . insulin glargine  15 Units Subcutaneous BID  . isosorbide mononitrate  30 mg Oral Daily  . ivabradine  5 mg Oral BID WC  . sodium chloride flush  3 mL Intravenous Q12H  . spironolactone  25 mg Oral Daily    OBJECTIVE  Filed Vitals:   09/30/15 1130 09/30/15 1200 09/30/15 2119 10/01/15 0545  BP: 111/85 119/87 117/72 122/65  Pulse:   90 81  Temp: 98 F (36.7 C)  98.3 F (36.8 C) 98.4 F (36.9 C)  TempSrc: Oral  Oral Oral  Resp: 23 22    Height:      Weight:    192 lb 6.4 oz (87.272 kg)  SpO2:   100% 100%    Intake/Output Summary (Last 24 hours) at 10/01/15 1149 Last data filed at 10/01/15 0819  Gross per 24 hour  Intake   1340 ml  Output    600 ml  Net    740 ml   Filed Weights   09/29/15 0456 09/30/15 0500 10/01/15 0545  Weight: 195 lb 3.2 oz (88.542 kg) 192 lb 14.4 oz (87.499 kg) 192 lb 6.4 oz (87.272 kg)   PHYSICAL EXAM  General: Pleasant, NAD. Neuro: Alert and oriented X 3. Moves all extremities spontaneously. Psych: Normal affect. HEENT:  Normal  Neck: Supple without bruits or JVD. Lungs:  Resp regular and unlabored. Diminished breath sound bibasilar.  Heart: RRR no s3, s4, or murmurs. Abdomen: Soft, non-tender, non-distended, BS + x 4.  Extremities: No clubbing, cyanosis or edema. DP/PT/Radials 2+ and equal bilaterally.  Accessory Clinical Findings  CBC  Recent Labs  09/28/15 2345 09/30/15 0348  WBC 5.5 5.3  HGB 13.3 12.9*  HCT 38.3* 38.4*  MCV 80.5 80.0  PLT 196 [id number]   Basic Metabolic Panel  Recent Labs  09/29/15 1212 09/30/15 0514  NA  132* 135  K 4.1 3.5  CL 96* 101  CO2 22 23  GLUCOSE 313* 237*  BUN 22* 24*  CREATININE 1.38* 1.31*  CALCIUM 9.2 8.9   TELE  Sinus rhythm at rate mostly in 90s  Radiology/Studies  Dg Chest 2 View  09/27/2015  CLINICAL DATA:  Shortness of breath and chest pain for days the EXAM: CHEST  2 VIEW COMPARISON:  05/09/2014 FINDINGS: Heart size is normal as is the vascular pattern. Coronary stent noted. Mild atelectatic change at both lung bases. No pleural effusion. IMPRESSION: No significant acute findings Electronically Signed   By: Skipper Cliche M.D.   On: 09/27/2015 11:54   Echo 09/29/15 LV EF: 15% - 20%  ------------------------------------------------------------------- Indications: Cardiomyopathy - ischemic 414.8.  ------------------------------------------------------------------- History: PMH: Coronary artery disease. Congestive heart failure. Stroke. Risk factors: Hypertension. Diabetes mellitus. Dyslipidemia.  ------------------------------------------------------------------- Study Conclusions  - Left ventricle: The cavity size was normal. There was moderate  concentric hypertrophy. Systolic function was severely reduced.  The estimated ejection fraction was in the range of 15% to 20%.  Akinesis of the basal-midanteroseptal myocardium. Features are  consistent with a  pseudonormal left ventricular filling pattern,  with concomitant abnormal relaxation and increased filling  pressure (grade 2 diastolic dysfunction). - Aortic valve: There was trivial regurgitation. - Mitral valve: There was severe regurgitation. - Left atrium: The atrium was mildly dilated.  Cath on 09/30/2015 Stable three vessel CAD 2. Patent stents mid LAD with minimal restenosis.  3. Patent stent mid Ramus with minimal restenosis. The small caliber sub-branch of the ramus is jailed by the mid Ramus stent.  4. Moderate stenosis in the RCA and Circumflex.  5. No focal  targets for PCI.  6. Elevated filling pressures.   Recommendations: Continue medical management of CAD. He remains volume overloaded. Would continue IV diuresis.        ASSESSMENT AND PLAN 1. Chest pain - With typical and atypical features. Concerning for unstable angina in setting uncontrolled HTN and sinus tachycardia. History of ischemic cardiomyopathy with stent placement to mid LAD 04/2014. He stopped taking all of his medication for the past 3 months except aspirin and Lantus. - Minimally elevated troponin 04 x 2. Mildly elevated BNP. Sinus tachycardia noted on EKG. No acute changes. - cath showed stable CAD, patent stents, so symptoms sec to HTN urgency and non-compliance.  - Continue IV heparin, asa 81mg , coreg 25mg  BID, lipitor 80mg  andnitro gtt.  - Echo this admission showed LV EF of 15-20%, grade 2 DD, trivial aortic regur, severe MR, mildly dilated LA.   2. Ischemic cardiomyopathy with acute on chronic systolic CHF - Left ventricular function of 20-25% on last echocardiogram 04/2014.  2D echo this admission showed ef of 15-20%. Follow up echo in 3 weeks, if remains in low EF --> ICD placement.  No VTs or h/o syncope. - UOP good. He is net neg3L with 11lb weight loss.  - continue IV diuresis. Renal function improving.  3. Acute on chronic systolic CHF - continue iv lasix as fluid overload on PE and high LVEDP on cath yesterday.  We added spironolactone yesterday.  4. DM - HbgA1C 9.1 - Appreciate Hospitalist's input for management of uncontrolled DM  5. AKI - Daily BMET. Improved with diuresis. Start ACE if renal function remains stable post cath.  6. HTN, Uncontrolled upon presentation - improved on BB. Will add Lopressor since BP still borderline elevated and still tachycardic.   7. Sinus tachycardia - Rate improved. Continue BB. Coreg dose maxed out.add ivabradin 5 mg po BID.   8. Dyslipidemia - LDL not at goal and started high dose statin. Will need  repeat FLP and ALT in 6 weeks.    Dorothy Spark 10/01/2015

## 2015-10-01 NOTE — Progress Notes (Signed)
MEDICAL CONSULTATION PROGRESS NOTE  Antonio Guerra [id number] DOB: 1950/09/21 DOA: 09/27/2015 PCP: No primary care provider on file. Outpatient Specialists:    LOS: 4 days   Brief Narrative: 65 year old male with known coronary artery disease, diabetes, noncompliant with his medications, was admitted to the cardiology service for chest pain. [facility] was asked to follow for diabetes mellitus  Assessment & Plan: Active Problems:   Unstable angina (HCC)   Ischemic cardiomyopathy   Hyperlipidemia   Coronary artery disease   Hypertensive urgency   Acute on chronic combined systolic (congestive) and diastolic (congestive) heart failure (HCC)   Type 2 diabetes mellitus (HCC)   Elevated troponin   Diabetes mellitus type 2 - uncontrolled - Patient refused his insulin 3/12 night because he was Levemir instead of Lantus which he takes at home. - Fasting glucose 223 3/13 am, resumed Lantus at 15 units daily as he was nothing by mouth for cardiac catheterization - Fasting sugar 203 this morning, would increase Lantus to 15 units twice daily - Continue sliding scale insulin - From diabetes standpoint, patient can be discharged  Dyslipidemia - Stable, continue atorvastatin  Hypertension - Stable, on Coreg  Chest pain - Management and discharge planning per cardiology    Antimicrobials:  None    Subjective: - no chest pain feeling good, states that fasting sugars at home are usually in the 200 range ,   Objective: Filed Vitals:   09/30/15 1130 09/30/15 1200 09/30/15 2119 10/01/15 0545  BP: 111/85 119/87 117/72 122/65  Pulse:   90 81  Temp: 98 F (36.7 C)  98.3 F (36.8 C) 98.4 F (36.9 C)  TempSrc: Oral  Oral Oral  Resp: 23 22    Height:      Weight:    87.272 kg (192 lb 6.4 oz)  SpO2:   100% 100%    Intake/Output Summary (Last 24 hours) at 10/01/15 1042 Last data filed at 10/01/15 0819  Gross per 24 hour  Intake   1340 ml  Output    600 ml  Net    740 ml   Filed  Weights   09/29/15 0456 09/30/15 0500 10/01/15 0545  Weight: 88.542 kg (195 lb 3.2 oz) 87.499 kg (192 lb 14.4 oz) 87.272 kg (192 lb 6.4 oz)    Examination: BP 122/65 mmHg  Pulse 81  Temp(Src) 98.4 F (36.9 C) (Oral)  Resp 22  Ht 6\' 1"  (1.854 m)  Wt 87.272 kg (192 lb 6.4 oz)  BMI 25.39 kg/m2  SpO2 100%  GENERAL: NAD  HEENT: head NCAT, no scleral icterus.  LUNGS: Clear to auscultation. No wheezing or crackles  HEART: Regular rate and rhythm without murmur. 2+ pulses, no JVD, no peripheral edema    Data Reviewed: I have personally reviewed following labs and imaging studies  CBC:  Recent Labs Lab 09/27/15 1059 09/28/15 0013 09/28/15 0525 09/28/15 2345 09/30/15 0348  WBC 5.5 6.8 6.6 5.5 5.3  HGB 14.2 13.5 13.3 13.3 12.9*  HCT 41.4 39.2 38.9* 38.3* 38.4*  MCV 80.2 79.5 80.0 80.5 80.0  PLT 223 207 194 196 [id number]   Basic Metabolic Panel:  Recent Labs Lab 09/27/15 1059 09/28/15 0525 09/29/15 1212 09/30/15 0514  NA 138 140 132* 135  K 4.3 3.8 4.1 3.5  CL 106 104 96* 101  CO2 23 26 22 23   GLUCOSE 224* 159* 313* 237*  BUN 16 16 22* 24*  CREATININE 1.31* 1.13 1.38* 1.31*  CALCIUM 9.5 9.3 9.2 8.9   GFR: Estimated Creatinine  Clearance: 65.2 mL/min (by C-G formula based on Cr of 1.31). Liver Function Tests:  Recent Labs Lab 09/27/15 1825  AST 26  ALT 24  ALKPHOS 85  BILITOT 0.9  PROT 6.9  ALBUMIN 3.6   No results for input(s): LIPASE, AMYLASE in the last 168 hours. No results for input(s): AMMONIA in the last 168 hours. Coagulation Profile:  Recent Labs Lab 09/27/15 1059 09/29/15 1212  INR 0.98 1.09   Cardiac Enzymes:  Recent Labs Lab 09/27/15 1825 09/28/15 0014 09/28/15 0525  TROPONINI 0.05* 0.04* 0.04*   BNP (last 3 results) No results for input(s): PROBNP in the last 8760 hours. HbA1C: No results for input(s): HGBA1C in the last 72 hours. CBG:  Recent Labs Lab 09/30/15 0749 09/30/15 1131 09/30/15 1650 09/30/15 2118  10/01/15 0744  GLUCAP 223* 223* 220* 286* 203*   Lipid Profile: No results for input(s): CHOL, HDL, LDLCALC, TRIG, CHOLHDL, LDLDIRECT in the last 72 hours. Thyroid Function Tests: No results for input(s): TSH, T4TOTAL, FREET4, T3FREE, THYROIDAB in the last 72 hours. Anemia Panel: No results for input(s): VITAMINB12, FOLATE, FERRITIN, TIBC, IRON, RETICCTPCT in the last 72 hours. Urine analysis:    Component Value Date/Time   COLORURINE YELLOW 09/27/2015 1210   APPEARANCEUR TURBID* 09/27/2015 1210   LABSPEC 1.021 09/27/2015 1210   PHURINE 5.0 09/27/2015 1210   GLUCOSEU 250* 09/27/2015 1210   HGBUR TRACE* 09/27/2015 1210   BILIRUBINUR NEGATIVE 09/27/2015 1210   KETONESUR NEGATIVE 09/27/2015 1210   PROTEINUR 100* 09/27/2015 1210   NITRITE NEGATIVE 09/27/2015 1210   LEUKOCYTESUR NEGATIVE 09/27/2015 1210   Sepsis Labs: Invalid input(s): PROCALCITONIN, LACTICIDVEN  Recent Results (from the past 240 hour(s))  MRSA PCR Screening     Status: None   Collection Time: 09/27/15  8:27 PM  Result Value Ref Range Status   MRSA by PCR NEGATIVE NEGATIVE Final    Comment:        The GeneXpert MRSA Assay (FDA approved for NASAL specimens only), is one component of a comprehensive MRSA colonization surveillance program. It is not intended to diagnose MRSA infection nor to guide or monitor treatment for MRSA infections.       Radiology Studies: No results found.   Scheduled Meds: . aspirin EC  81 mg Oral Daily  . atorvastatin  80 mg Oral q1800  . carvedilol  25 mg Oral BID WC  . folic acid  1 mg Oral Daily  . furosemide  40 mg Intravenous BID  . hydrALAZINE  25 mg Oral 3 times per day  . insulin aspart  0-9 Units Subcutaneous TID WC  . insulin glargine  15 Units Subcutaneous BID  . isosorbide mononitrate  30 mg Oral Daily  . ivabradine  5 mg Oral BID WC  . sodium chloride flush  3 mL Intravenous Q12H  . spironolactone  25 mg Oral Daily   Continuous Infusions:    Marzetta Board, MD, PhD [facility] Pager [phone]  If 7PM-7AM, please contact night-coverage [email or web address] Password [facility] 10/01/2015, 10:42 AM

## 2015-10-01 NOTE — Care Management Note (Addendum)
Case Management Note  Patient Details  Name: Antonio Guerra MRN: [id number] Date of Birth: April 19, 1951  Subjective/Objective:  Pt admitted for Hypertension Urgency. Pt is from home with family support. Pt uses [facility].  Pt is a participant in the [employer] Vest Trial.                  Action/Plan: CM did speak with pt-in reference to medication. Pt has Medicaid for medication. Pt will be on new medication Corlanor. CM will check Medicaid Registry to see if medication is covered. Pt uses [facility] and [employer] [facility] [employer]. CM did call [facility] on [address] and medication is available and cost will be $3.00. No further needs at this time.    Expected Discharge Date:                  Expected Discharge Plan:  Home/Self Care  In-House Referral:  NA  Discharge planning Services  CM Consult, Medication Assistance  Post Acute Care Choice:  NA Choice offered to:  NA  DME Arranged:  N/A DME Agency:  NA  HH Arranged:  NA HH Agency:  NA  Status of Service:  Completed, signed off  Medicare Important Message Given:    Date Medicare IM Given:    Medicare IM give by:    Date Additional Medicare IM Given:    Additional Medicare Important Message give by:     If discussed at [place] of Stay Meetings, dates discussed:    Additional Comments: 1534 10-02-15 Jacqlyn Krauss, RN,BSN [phone] Pt has Rx for glucose meter. CM did make sure he knows which pharmacy to [facility]. No further needs from CM at this time.   Bethena Roys, RN 10/01/2015, 11:48 AM

## 2015-10-02 LAB — BASIC METABOLIC PANEL
Anion gap: 14 (ref 5–15)
BUN: 30 mg/dL — AB (ref 6–20)
CALCIUM: 9.7 mg/dL (ref 8.9–10.3)
CO2: 23 mmol/L (ref 22–32)
CREATININE: 1.53 mg/dL — AB (ref 0.61–1.24)
Chloride: 99 mmol/L — ABNORMAL LOW (ref 101–111)
GFR calc Af Amer: 54 mL/min — ABNORMAL LOW (ref >60)
GFR, EST NON AFRICAN AMERICAN: 47 mL/min — AB (ref >60)
GLUCOSE: 326 mg/dL — AB (ref 65–99)
Potassium: 4.1 mmol/L (ref 3.5–5.1)
SODIUM: 136 mmol/L (ref 135–145)

## 2015-10-02 LAB — GLUCOSE, CAPILLARY
GLUCOSE-CAPILLARY: 204 mg/dL — AB (ref 65–99)
GLUCOSE-CAPILLARY: 331 mg/dL — AB (ref 65–99)

## 2015-10-02 MED ORDER — BLOOD GLUCOSE METER KIT: PACK | Status: DC

## 2015-10-02 MED ORDER — INSULIN ASPART 100 UNIT/ML ~~LOC~~ SOLN: 0.0000 [IU] | Freq: Three times a day (TID) | SUBCUTANEOUS | Status: DC

## 2015-10-02 MED ORDER — HYDRALAZINE HCL 25 MG PO TABS: 25.0000 mg | ORAL_TABLET | Freq: Three times a day (TID) | ORAL | Status: DC

## 2015-10-02 MED ORDER — SPIRONOLACTONE 25 MG PO TABS: 25.0000 mg | ORAL_TABLET | Freq: Every day | ORAL | Status: DC

## 2015-10-02 MED ORDER — ISOSORBIDE MONONITRATE ER 30 MG PO TB24: 30.0000 mg | ORAL_TABLET | Freq: Every day | ORAL | Status: DC

## 2015-10-02 MED ORDER — INSULIN GLARGINE 100 UNIT/ML ~~LOC~~ SOLN: 20.0000 [IU] | Freq: Two times a day (BID) | SUBCUTANEOUS | Status: DC

## 2015-10-02 MED ORDER — FUROSEMIDE 40 MG PO TABS: 40.0000 mg | ORAL_TABLET | Freq: Two times a day (BID) | ORAL | Status: DC

## 2015-10-02 MED ORDER — INSULIN SYRINGES (DISPOSABLE) U-100 0.5 ML MISC: Status: DC

## 2015-10-02 MED ORDER — IVABRADINE HCL 5 MG PO TABS: 5.0000 mg | ORAL_TABLET | Freq: Two times a day (BID) | ORAL | Status: DC

## 2015-10-02 MED ORDER — INSULIN ASPART 100 UNIT/ML ~~LOC~~ SOLN
3.0000 [IU] | Freq: Three times a day (TID) | SUBCUTANEOUS | Status: DC
  Administered 2015-10-02: 3 [IU] via SUBCUTANEOUS

## 2015-10-02 MED ORDER — METFORMIN HCL 500 MG PO TABS: 500.0000 mg | ORAL_TABLET | Freq: Every day | ORAL | Status: DC

## 2015-10-02 MED ORDER — INSULIN GLARGINE 100 UNIT/ML ~~LOC~~ SOLN
20.0000 [IU] | Freq: Two times a day (BID) | SUBCUTANEOUS | Status: DC
  Administered 2015-10-02: 20 [IU] via SUBCUTANEOUS
  Filled 2015-10-02 (×2): qty 0.2

## 2015-10-02 NOTE — Progress Notes (Signed)
Patient Name: Antonio Guerra Date of Encounter: 10/02/2015   SUBJECTIVE  Feeling well. No chest pain, sob or palpitations.   CURRENT MEDS . aspirin EC  81 mg Oral Daily  . atorvastatin  80 mg Oral q1800  . carvedilol  25 mg Oral BID WC  . folic acid  1 mg Oral Daily  . furosemide  40 mg Intravenous BID  . hydrALAZINE  25 mg Oral 3 times per day  . insulin aspart  0-9 Units Subcutaneous TID WC  . insulin aspart  3 Units Subcutaneous TID WC  . insulin glargine  20 Units Subcutaneous BID  . isosorbide mononitrate  30 mg Oral Daily  . ivabradine  5 mg Oral BID WC  . sodium chloride flush  3 mL Intravenous Q12H  . spironolactone  25 mg Oral Daily    OBJECTIVE  Filed Vitals:   10/01/15 1242 10/01/15 1725 10/01/15 1954 10/02/15 0500  BP: 102/65 128/92 132/77 146/86  Pulse:   81 88  Temp: 97.5 F (36.4 C)  98 F (36.7 C) 98.2 F (36.8 C)  TempSrc: Oral  Oral Oral  Resp: 14  16 16   Height:      Weight:    192 lb 9.6 oz (87.363 kg)  SpO2: 100%  100% 100%    Intake/Output Summary (Last 24 hours) at 10/02/15 1122 Last data filed at 10/02/15 1000  Gross per 24 hour  Intake    960 ml  Output   1370 ml  Net   -410 ml   Filed Weights   09/30/15 0500 10/01/15 0545 10/02/15 0500  Weight: 192 lb 14.4 oz (87.499 kg) 192 lb 6.4 oz (87.272 kg) 192 lb 9.6 oz (87.363 kg)    PHYSICAL EXAM  General: Pleasant, NAD. Neuro: Alert and oriented X 3. Moves all extremities spontaneously. Psych: Normal affect. HEENT:  Normal  Neck: Supple without bruits or JVD. Lungs:  Resp regular and unlabored, CTA. Heart: RRR no s3, s4, or murmurs. Abdomen: Soft, non-tender, non-distended, BS + x 4.  Extremities: No clubbing, cyanosis or edema. DP/PT/Radials 2+ and equal bilaterally.  Accessory Clinical Findings  CBC  Recent Labs  09/30/15 0348  WBC 5.3  HGB 12.9*  HCT 38.4*  MCV 80.0  PLT 123456   Basic Metabolic Panel  Recent Labs  09/29/15 1212 09/30/15 0514  NA 132* 135  K 4.1  3.5  CL 96* 101  CO2 22 23  GLUCOSE 313* 237*  BUN 22* 24*  CREATININE 1.38* 1.31*  CALCIUM 9.2 8.9    TELE  Sinus rhythm at rate of 70-80s   Echo 09/29/15 LV EF: 15% - 20%  ------------------------------------------------------------------- Indications: Cardiomyopathy - ischemic 414.8.  ------------------------------------------------------------------- History: PMH: Coronary artery disease. Congestive heart failure. Stroke. Risk factors: Hypertension. Diabetes mellitus. Dyslipidemia.  ------------------------------------------------------------------- Study Conclusions  - Left ventricle: The cavity size was normal. There was moderate  concentric hypertrophy. Systolic function was severely reduced.  The estimated ejection fraction was in the range of 15% to 20%.  Akinesis of the basal-midanteroseptal myocardium. Features are  consistent with a pseudonormal left ventricular filling pattern,  with concomitant abnormal relaxation and increased filling  pressure (grade 2 diastolic dysfunction). - Aortic valve: There was trivial regurgitation. - Mitral valve: There was severe regurgitation. - Left atrium: The atrium was mildly dilated.  Cath on 09/30/2015 Stable three vessel CAD 2. Patent stents mid LAD with minimal restenosis.  3. Patent stent mid Ramus with minimal restenosis. The small caliber sub-branch of the ramus  is jailed by the mid Ramus stent.  4. Moderate stenosis in the RCA and Circumflex.  5. No focal targets for PCI.  6. Elevated filling pressures.   Recommendations: Continue medical management of CAD. He remains volume overloaded. Would continue IV diuresis.          Radiology/Studies  Dg Chest 2 View  09/27/2015  CLINICAL DATA:  Shortness of breath and chest pain for days the EXAM: CHEST  2 VIEW COMPARISON:  05/09/2014 FINDINGS: Heart size is normal as is the vascular pattern. Coronary stent noted. Mild atelectatic  change at both lung bases. No pleural effusion. IMPRESSION: No significant acute findings Electronically Signed   By: Skipper Cliche M.D.   On: 09/27/2015 11:54    ASSESSMENT AND PLAN   1. Chest pain - With typical and atypical features. Concerning for unstable angina in setting uncontrolled HTN and sinus tachycardia. History of ischemic cardiomyopathy with stent placement to mid LAD 04/2014. He stopped taking all of his medication for the past 3 months except aspirin and Lantus. - Minimally elevated troponin 0.04. Mildly elevated BNP. Sinus tachycardia noted on EKG. No acute changes. - cath showed stable CAD, patent stents, so symptoms sec to HTN urgency and non-compliance.  - Continueasa 81mg , coreg 25mg  BID, lipitor 80mg , imdur.  - Echo this admission showed LV EF of 15-20%, grade 2 DD, trivial aortic regur, severe MR, mildly dilated LA.   2. Ischemic cardiomyopathy with acute on chronic systolic CHF - Left ventricular function of 20-25% on last echocardiogram 04/2014. 2D echo this admission showed ef of 15-20%. Follow up echo in 3 weeks, if remains in low EF --> ICD placement. No VTs or h/o syncope. - UOP good. He is net negative 2.6L with 11lb weight loss.  - Will get stat BMET prior to discharge.   3. Acute on chronic systolic CHF  - Added spironolactone. On imdur and hydralazin. Start ACE at discharge?   4. DM - HbgA1C 9.1 - Appreciate Hospitalist's input for management of uncontrolled DM. F/u with PCP post discharge.   5. AKI - Daily BMET. Improved with diuresis.   6. HTN, Uncontrolled upon presentation - improved. Continue current regimen.   7. Sinus tachycardia - Rate improved. Coreg dose maxed out.Added ivabradin 5 mg po BID.   8. Dyslipidemia - LDL not at goal and started high dose statin. Will need repeat FLP and ALT in 6 weeks.   Dispo: Euvolemic. Discharge later today once we get BEMT. MD to review discharge medications.  Signed, Leanor Kail  PA-C Pager [phone]  The patient was seen, examined and discussed with Bhagat,Bhavinkumar PA-C and I agree with the above.   65 year old male with known CAD, ischemic cardiomyopathy admitted with hypertensive urgency, cath showed stable disease, now eveln lower LVEF, now 15-20%, the patient is chronically meds non-compliant, much improved after restarting his meds, we will send him home on all of the inpatient meds, switch iv lasix to 40 mg po BID and arrange for an early follow up in the clinic. Repeat echo in 2 months, if LVEF remains < 35%, we will refer for ICD implantation. No VT on telemetry and no h/o syncope, no Lifevest at this time.  Dorothy Spark 10/02/2015

## 2015-10-02 NOTE — Progress Notes (Signed)
ReDS Vest Discharge Study  Results of ReDS reading  Your patient is in the Unblinded arm of the Vest at Discharge study.  The ReDS reading is:   ( < 39) = 34  Your patient is ok for discharge.    Thank You   The research team

## 2015-10-02 NOTE — Progress Notes (Signed)
[facility]                                                                              Patient Demographics  Antonio Guerra, is a 65 y.o. male, DOB - 1951-02-17, [id number]  Admit date - 09/27/2015   Admitting Physician Sueanne Margarita, MD  Outpatient Primary MD for the patient is OSEI-BONSU,GEORGE, MD  LOS - 5  days    Chief Complaint  Patient presents with  . Shortness of Breath  . Chest Pain       Brief HPI    65 year old male with known coronary artery disease, diabetes, noncompliant with his medications, was admitted to the cardiology service for chest pain. TRH was asked to follow for diabetes mellitus  Assessment & Plan    Diabetes mellitus type 2 - uncontrolled likely due to noncompliance - Patient refused his insulin 3/12 night because he was Levemir instead of Lantus which he takes at home. - CBGs still elevated, increased Lantus to 20 units twice a day, added meal coverage and continue sliding scale insulin - Continue sliding scale insulin - I have given the prescriptions for Lantus with refills, glucometer and insulin syringes, lancets strips. I also placed him on metformin 500 mg daily, will improve insulin sensitization, gave the prescription. Patient refused to be on sliding scale insulin.   Dyslipidemia - Stable, continue atorvastatin  Hypertension - Stable, on Coreg  Chest pain - Management and discharge planning per cardiology   Code Status: Full code  Family Communication: Discussed in detail with the patient, all imaging results, lab results explained to the patient    Disposition Plan: Patient is stable from medical standpoint, he needs to follow up with his PCP, Dr. Vista Lawman in 1 week for further adjustment in his insulin and metformin outpatient. I will sign off.  Time Spent in minutes   25 minutes   DVT Prophylaxis   SCD's  Medications  Scheduled Meds: . aspirin EC  81 mg Oral Daily  . atorvastatin  80 mg Oral  q1800  . carvedilol  25 mg Oral BID WC  . folic acid  1 mg Oral Daily  . furosemide  40 mg Intravenous BID  . hydrALAZINE  25 mg Oral 3 times per day  . insulin aspart  0-9 Units Subcutaneous TID WC  . insulin aspart  3 Units Subcutaneous TID WC  . insulin glargine  20 Units Subcutaneous BID  . isosorbide mononitrate  30 mg Oral Daily  . ivabradine  5 mg Oral BID WC  . sodium chloride flush  3 mL Intravenous Q12H  . spironolactone  25 mg Oral Daily   Continuous Infusions:  PRN Meds:.sodium chloride, acetaminophen, ondansetron (ZOFRAN) IV, sodium chloride flush   Antibiotics   Anti-infectives    None        Subjective:   Antonio Guerra was seen and examined today. Patient denies dizziness, chest pain, shortness of breath, abdominal pain, N/V/D/C, new weakness, numbess, tingling. No acute events overnight.    Objective:   Filed Vitals:   10/01/15 1242 10/01/15 1725 10/01/15 1954 10/02/15 0500  BP: 102/65 128/92 132/77 146/86  Pulse:   81 88  Temp: 97.5 F (36.4 C)  98 F (36.7 C) 98.2 F (36.8 C)  TempSrc: Oral  Oral Oral  Resp: 14  16 16   Height:      Weight:    87.363 kg (192 lb 9.6 oz)  SpO2: 100%  100% 100%    Intake/Output Summary (Last 24 hours) at 10/02/15 1226 Last data filed at 10/02/15 1200  Gross per 24 hour  Intake   1200 ml  Output   1370 ml  Net   -170 ml     Wt Readings from Last 3 Encounters:  10/02/15 87.363 kg (192 lb 9.6 oz)  05/11/14 92.1 kg (203 lb 0.7 oz)     Exam  General: Alert and oriented x 3, NAD  HEENT:  PERRLA, EOMI, Anicteric Sclera, mucous membranes moist.   Neck: Supple, no JVD, no masses  CVS: S1 S2 auscultated, no rubs, murmurs or gallops. Regular rate and rhythm.  Respiratory: Clear to auscultation bilaterally, no wheezing, rales or rhonchi  Abdomen: Soft, nontender, nondistended, + bowel sounds  Ext: no cyanosis clubbing or edema  Neuro: AAOx3, Cr N's II- XII. Strength 5/5 upper and lower extremities  bilaterally  Skin: No rashes  Psych: Normal affect and demeanor, alert and oriented x3    Data Reviewed:  I have personally reviewed following labs and imaging studies  Micro Results Recent Results (from the past 240 hour(s))  MRSA PCR Screening     Status: None   Collection Time: 09/27/15  8:27 PM  Result Value Ref Range Status   MRSA by PCR NEGATIVE NEGATIVE Final    Comment:        The GeneXpert MRSA Assay (FDA approved for NASAL specimens only), is one component of a comprehensive MRSA colonization surveillance program. It is not intended to diagnose MRSA infection nor to guide or monitor treatment for MRSA infections.     Radiology Reports Dg Chest 2 View  09/27/2015  CLINICAL DATA:  Shortness of breath and chest pain for days the EXAM: CHEST  2 VIEW COMPARISON:  05/09/2014 FINDINGS: Heart size is normal as is the vascular pattern. Coronary stent noted. Mild atelectatic change at both lung bases. No pleural effusion. IMPRESSION: No significant acute findings Electronically Signed   By: Skipper Cliche M.D.   On: 09/27/2015 11:54    CBC  Recent Labs Lab 09/27/15 1059 09/28/15 0013 09/28/15 0525 09/28/15 2345 09/30/15 0348  WBC 5.5 6.8 6.6 5.5 5.3  HGB 14.2 13.5 13.3 13.3 12.9*  HCT 41.4 39.2 38.9* 38.3* 38.4*  PLT 223 207 194 196 213  MCV 80.2 79.5 80.0 80.5 80.0  MCH 27.5 27.4 27.4 27.9 26.9  MCHC 34.3 34.4 34.2 34.7 33.6  RDW 14.0 14.1 14.0 13.9 14.0    Chemistries   Recent Labs Lab 09/27/15 1059 09/27/15 1825 09/28/15 0525 09/29/15 1212 09/30/15 0514 10/02/15 1143  NA 138  --  140 132* 135 136  K 4.3  --  3.8 4.1 3.5 4.1  CL 106  --  104 96* 101 99*  CO2 23  --  26 22 23 23   GLUCOSE 224*  --  159* 313* 237* 326*  BUN 16  --  16 22* 24* 30*  CREATININE 1.31*  --  1.13 1.38* 1.31* 1.53*  CALCIUM 9.5  --  9.3 9.2 8.9 9.7  AST  --  26  --   --   --   --   ALT  --  24  --   --   --   --  ALKPHOS  --  85  --   --   --   --   BILITOT  --  0.9   --   --   --   --    ------------------------------------------------------------------------------------------------------------------ estimated creatinine clearance is 55.8 mL/min (by C-G formula based on Cr of 1.53). ------------------------------------------------------------------------------------------------------------------ No results for input(s): HGBA1C in the last 72 hours. ------------------------------------------------------------------------------------------------------------------ No results for input(s): CHOL, HDL, LDLCALC, TRIG, CHOLHDL, LDLDIRECT in the last 72 hours. ------------------------------------------------------------------------------------------------------------------ No results for input(s): TSH, T4TOTAL, T3FREE, THYROIDAB in the last 72 hours.  Invalid input(s): FREET3 ------------------------------------------------------------------------------------------------------------------ No results for input(s): VITAMINB12, FOLATE, FERRITIN, TIBC, IRON, RETICCTPCT in the last 72 hours.  Coagulation profile  Recent Labs Lab 09/27/15 1059 09/29/15 1212  INR 0.98 1.09    No results for input(s): DDIMER in the last 72 hours.  Cardiac Enzymes  Recent Labs Lab 09/27/15 1825 09/28/15 0014 09/28/15 0525  TROPONINI 0.05* 0.04* 0.04*   ------------------------------------------------------------------------------------------------------------------ Invalid input(s): POCBNP   Recent Labs  09/30/15 2118 10/01/15 0744 10/01/15 1129 10/01/15 1612 10/02/15 0750 10/02/15 1149  GLUCAP 286* 203* 253* 218* 204* 331*     RAI,RIPUDEEP M.D. [facility] 10/02/2015, 12:26 PM  Pager: [phone] Between 7am to 7pm - call Pager - [phone]  After 7pm go to [email or web address] - password [email or web address]  Call night coverage person covering after 7pm

## 2015-10-02 NOTE — Progress Notes (Signed)
Discharge teaching and instructions reviewed. Vss. Prescriptions with pt. Pt discharging home via daughter.

## 2015-10-02 NOTE — Discharge Summary (Signed)
Discharge Summary    Patient ID: Antonio Guerra,  MRN: [id number], DOB/AGE: 09/20/1950 65 y.o.  Admit date: 09/27/2015 Discharge date: 10/02/2015  Primary Care Provider: OSEI-BONSU,GEORGE Primary Cardiologist: Dr. Percival Spanish  Discharge Diagnoses    Principal Problem:   Unstable angina [facility]) Active Problems:   Ischemic cardiomyopathy   Hyperlipidemia   Coronary artery disease   Hypertensive urgency   Acute on chronic combined systolic (congestive) and diastolic (congestive) heart failure (HCC)   Type 2 diabetes mellitus (HCC)   Elevated troponin   Non compliance w medication regimen   AKI   Sinus Tachycardia  Allergies No Known Allergies  Diagnostic Studies/Procedures    Echo 09/29/15 LV EF: 15% - 20%  ------------------------------------------------------------------- Indications: Cardiomyopathy - ischemic 414.8.  ------------------------------------------------------------------- History: PMH: Coronary artery disease. Congestive heart failure. Stroke. Risk factors: Hypertension. Diabetes mellitus. Dyslipidemia.  ------------------------------------------------------------------- Study Conclusions  - Left ventricle: The cavity size was normal. There was moderate  concentric hypertrophy. Systolic function was severely reduced.  The estimated ejection fraction was in the range of 15% to 20%.  Akinesis of the basal-midanteroseptal myocardium. Features are  consistent with a pseudonormal left ventricular filling pattern,  with concomitant abnormal relaxation and increased filling  pressure (grade 2 diastolic dysfunction). - Aortic valve: There was trivial regurgitation. - Mitral valve: There was severe regurgitation. - Left atrium: The atrium was mildly dilated.  Cath on 09/30/2015 Stable three vessel CAD 2. Patent stents mid LAD with minimal restenosis.  3. Patent stent mid Ramus with minimal restenosis. The small caliber sub-branch of  the ramus is jailed by the mid Ramus stent.  4. Moderate stenosis in the RCA and Circumflex.  5. No focal targets for PCI.  6. Elevated filling pressures.   Recommendations: Continue medical management of CAD. He remains volume overloaded. Would continue IV diuresis.               History of Present Illness     Antonio Guerra is a 65 y.o. male with a history of CAD, ischemic cardiomyopathy, chronic systolic HF, HTN, stroke, DM, who presented to [facility] ED for evaluation of SOB and chest pain.   Patient was admitted 04/2014 with unstable angina. Seen by [facility] heart care first time. Attempt was made to obtain records from [facility] in [place], [place] where his prior procedures were performed however, attempts were unsuccessful. 2-D echocardiography was performed revealing an EF of 20-25% with multiple wall motion abnormalities. He underwent diagnostic catheterization May 11, 2014, revealing severe in-stent restenosis within the mid LAD and otherwise nonobstructive coronary artery disease. The LAD was successfully treated using a 3.5 x 38 mm Xience drug-eluting stent.  Since discharge he has not followed up as outpatient. Care was managed my PCP. Approximately 3 months ago he had a some issue with PCP and stopped seeing his primary care provider. Subsequently, he stopped taking all of his medication except aspirin 81 mg and lantus for diabetes.  For  the past 3-5 days he has being having intermittent left-sided chest pain. He described the pain as a pressure which worse with exertion and relieved with rest. No radiation of pain. Also admits to having intermittent shortness of breath, orthopnea, dizziness and PND. Last night he was unable to lay flat and came to ED for further evaluation. The patient denies lower extremity edema, palpitations, nausea, vomiting, blood in stool or urine. He is admits to having a dry cough and intermittent diarrhea. No recent sick  contacts. Last use  of marijuana 2 days ago. Denies tobacco or cocaine abuse.  EKG shows sinus tachycardia at rate of 125bpm, LAFB, nonspecific ST and T-wave changes necessary in lateral lead. Which appears similar compared to prior EKG of 05/12/2014.  Blood pressure of 176/118 upon presentation. Point-of-care troponin 0.04. BNP 388.9. Creatinine 1.31. Blood glucose 224. Urine drug screen positive for marijuana. Chest x-ray clear.  The patient was given aspirin 81 mg, Coreg 25 mg Lasix 20 mg by mouth and nitroglycerin patch in ED.    Hospital Course     Consultants: IM for DM managment  1. Chest pain - With typical and atypical features. Concerning for unstable angina in setting uncontrolled HTN and sinus tachycardia. History of ischemic cardiomyopathy with stent placement to mid LAD 04/2014. He stopped taking all of his medication for the past 3 months except aspirin and Lantus. - Minimally elevated troponin 0.04 with flat trend. Mildly elevated BNP. Sinus tachycardia noted on EKG. No acute changes. - Cath this admission showed stable CAD, patent stents, so symptoms sec to HTN urgency and non-compliance.  - Continueasa '81mg'$ , coreg '25mg'$  BID, lipitor '80mg'$ , imdur.  - Echo this admission showed LV EF of 15-20%, grade 2 DD, trivial aortic regur, severe MR, mildly dilated LA.   2. Ischemic cardiomyopathy with acute on chronic systolic CHF - Left ventricular function of 20-25% on last echocardiogram 04/2014. 2D echo this admission showed ef of 15-20%. Felt likely due to medication non-compliance chronically.  No VTs or h/o syncope. - UOP good. He is net negative 2.6L with 11lb weight loss (203-->192lb), .   3. Acute on chronic systolic CHF  - Added spironolactone. On imdur and hydralazine.   4. DM - HbgA1C 9.1 - Appreciate Hospitalist's input for management of uncontrolled DM. F/u with PCP post discharge.   5. AKI - Daily BMET. Improved with diuresis initially however worsen  afterwards. BMET during TCM.   6. HTN, Uncontrolled upon presentation - improved. Continue current regimen.   7. Sinus tachycardia - Rate improved. Coreg dose maxed out.Added ivabradin 5 mg po BID.   8. Dyslipidemia - LDL not at goal and started high dose statin. Will need repeat FLP and ALT in 6 weeks  The patient has been seen by Dr. Meda Coffee today and deemed ready for discharge home. All follow-up appointments have been scheduled. Discharge medications are listed below.   Repeat echo in 2 months, if LVEF remains < 35%, we will refer for ICD implantation. No VT on telemetry and no h/o syncope, no Lifevest at this time. BMET during TCM.   Discharge Vitals Blood pressure 150/79, pulse 88, temperature 98 F (36.7 C), temperature source Oral, resp. rate 15, height '6\' 1"'$  (1.854 m), weight 192 lb 9.6 oz (87.363 kg), SpO2 100 %.  Filed Weights   09/30/15 0500 10/01/15 0545 10/02/15 0500  Weight: 192 lb 14.4 oz (87.499 kg) 192 lb 6.4 oz (87.272 kg) 192 lb 9.6 oz (87.363 kg)    Labs & Radiologic Studies     CBC  Recent Labs  09/30/15 0348  WBC 5.3  HGB 12.9*  HCT 38.4*  MCV 80.0  PLT 161   Basic Metabolic Panel  Recent Labs  09/30/15 0514 10/02/15 1143  NA 135 136  K 3.5 4.1  CL 101 99*  CO2 23 23  GLUCOSE 237* 326*  BUN 24* 30*  CREATININE 1.31* 1.53*  CALCIUM 8.9 9.7   Liver Function Tests No results for input(s): AST, ALT, ALKPHOS, BILITOT, PROT, ALBUMIN in the last  72 hours. No results for input(s): LIPASE, AMYLASE in the last 72 hours. Cardiac Enzymes No results for input(s): CKTOTAL, CKMB, CKMBINDEX, TROPONINI in the last 72 hours. BNP Invalid input(s): POCBNP D-Dimer No results for input(s): DDIMER in the last 72 hours. Hemoglobin A1C No results for input(s): HGBA1C in the last 72 hours. Fasting Lipid Panel No results for input(s): CHOL, HDL, LDLCALC, TRIG, CHOLHDL, LDLDIRECT in the last 72 hours. Thyroid Function Tests No results for input(s): TSH,  T4TOTAL, T3FREE, THYROIDAB in the last 72 hours.  Invalid input(s): FREET3  Dg Chest 2 View  09/27/2015  CLINICAL DATA:  Shortness of breath and chest pain for days the EXAM: CHEST  2 VIEW COMPARISON:  05/09/2014 FINDINGS: Heart size is normal as is the vascular pattern. Coronary stent noted. Mild atelectatic change at both lung bases. No pleural effusion. IMPRESSION: No significant acute findings Electronically Signed   By: Skipper Cliche M.D.   On: 09/27/2015 11:54    Disposition   Pt is being discharged home today in good condition.  Follow-up Plans & Appointments    Follow-up Information    Follow up with OSEI-BONSU,GEORGE, MD. Schedule an appointment as soon as possible for a visit in 3 weeks.   Specialty:  Internal Medicine   Why:  for DM managment   Contact information:   [place] [address] [phone]       Follow up with Richardson Dopp, PA-C. Go on 10/08/2015.   Specialties:  Physician Assistant, Radiology, Interventional Cardiology   Why:  '@10'$ :00 for TCM   Contact information:   [address] [address] [phone]      Discharge Instructions    Diet - low sodium heart healthy    Complete by:  As directed      Discharge instructions    Complete by:  As directed   *Weigh yourself on the same scale at same time of day and keep a log. *Report weight gain of > 2 lbs in 1 day or 5 lbs over the course of a week and/or symptoms of excess fluid (shortness of breath, difficulty lying flat, swelling, poor appetite, abdominal fullness/bloating, etc) to your doctor immediately. *Avoid foods that are high in sodium (processed, pre-packaged/canned goods, fast foods, etc). *Please attend all scheduled and reccommended follow up appointments     Increase activity slowly    Complete by:  As directed            Discharge Medications   Current Discharge Medication List    START taking these medications   Details  blood  glucose meter kit and supplies Dispense based on patient and insurance preference. Use up to four times daily as directed. (FOR ICD-9 250.00, 250.01). Qty: 1 each, Refills: 0    hydrALAZINE (APRESOLINE) 25 MG tablet Take 1 tablet (25 mg total) by mouth every 8 (eight) hours. Qty: 90 tablet, Refills: 6    Insulin Syringes, Disposable, U-100 0.5 ML MISC Take Lantus insulin as directed    Diagnosis: Insulin dependent diabetes mellitus ICD10 code Z79.4 Qty: 100 each, Refills: 4    isosorbide mononitrate (IMDUR) 30 MG 24 hr tablet Take 1 tablet (30 mg total) by mouth daily. Qty: 30 tablet, Refills: 6    ivabradine (CORLANOR) 5 MG TABS tablet Take 1 tablet (5 mg total) by mouth 2 (two) times daily with a meal. Qty: 60 tablet, Refills: 6    metFORMIN (GLUCOPHAGE) 500 MG tablet Take 1 tablet (500  mg total) by mouth daily with breakfast. Qty: 30 tablet, Refills: 5    spironolactone (ALDACTONE) 25 MG tablet Take 1 tablet (25 mg total) by mouth daily. Qty: 30 tablet, Refills: 6      CONTINUE these medications which have CHANGED   Details  furosemide (LASIX) 40 MG tablet Take 1 tablet (40 mg total) by mouth 2 (two) times daily. Qty: 60 tablet, Refills: 3    insulin glargine (LANTUS) 100 UNIT/ML injection Inject 0.2-0.25 mLs (20-25 Units total) into the skin 2 (two) times daily. Take 20 units every morning and 25 units at bedtime. Qty: 10 mL, Refills: 11      CONTINUE these medications which have NOT CHANGED   Details  aspirin EC 81 MG tablet Take 81 mg by mouth daily.    atorvastatin (LIPITOR) 80 MG tablet Take 1 tablet (80 mg total) by mouth daily at 6 PM. Qty: 30 tablet, Refills: 6    carvedilol (COREG) 25 MG tablet Take 1 tablet (25 mg total) by mouth 2 (two) times daily with a meal. Qty: 60 tablet, Refills: 6    folic acid (FOLVITE) 1 MG tablet Take 1 mg by mouth daily.    nitroGLYCERIN (NITROSTAT) 0.4 MG SL tablet Place 1 tablet (0.4 mg total) under the tongue every 5 (five)  minutes as needed for chest pain. Qty: 25 tablet, Refills: 3      STOP taking these medications     clopidogrel (PLAVIX) 75 MG tablet           Outstanding Labs/Studies   BMET during TCM   Duration of Discharge Encounter   Greater than 30 minutes including physician time.  Signed, Prosper Paff PA-C 10/02/2015, 2:52 PM

## 2015-10-07 NOTE — Progress Notes (Deleted)
Cardiology Office Note:    Date:  10/07/2015   ID:  Antonio Guerra, DOB 08-Jun-1951, MRN [id number]  PCP:  Antonio Mccreedy, MD  Cardiologist:  Dr. Minus Breeding   Electrophysiologist:  n/a  Chief Complaint  Patient presents with  . Hospitalization Follow-up    History of Present Illness:     Antonio Guerra is a 65 y.o. male with a hx of ***    Past Medical History  Diagnosis Date  . Coronary artery disease     a. 2011 MI x 2 with PCI: stent x 2 (LAD and RI) @ [facility] in [place], [place];  b. 04/2014 Cath/PCI: LM 10-20, LAD 40p, 59m 886mSR(3.5x38 Xience DES), 50apical, LCX 20 diffuse, RI 50p, 2070mR, RCA 40-61m29md.  . Diabetes mellitus without complication (HCC)[place]. Hypertension   . [facility]  [facility]  . Hyperlipidemia   . Ischemic cardiomyopathy     a. 04/2014 Echo: EF 20-25%, anteroseptal, inferior, & basal-mid inferoseptal AK. Sev apical/ant HK, Ao sclerosis, mild MR, triv PI.  . ChMarland Kitchenonic systolic congestive heart failure (HCC)[place]  a. 04/2014 Echo: EF 20-25%.  . HyMarland Kitchenertensive urgency 09/27/2015  . Acute on chronic combined systolic (congestive) and diastolic (congestive) heart failure (HCC)[place]10/2017    Past Surgical History  Procedure Laterality Date  . Ankle surgery    . Coronary stent placement    . Left heart catheterization with coronary/graft angiogram N/A 05/11/2014    Procedure: LEFT HEART CATHETERIZATION WITH COROBeatrix Fettersurgeon: ChriBurnell Blanks;  [facility];  Service: Cardiovascular;  Laterality: N/A;  . Percutaneous coronary stent intervention (pci-s)  05/11/2014    Procedure: PERCUTANEOUS CORONARY STENT INTERVENTION (PCI-S);  Surgeon: ChriBurnell Blanks;  [facility];  Service: Cardiovascular;;  . Fractional flow reserve wire  05/11/2014    Procedure: FRACTIONAL FLOW RESERVE WIRE;  Surgeon: ChriBurnell Blanks;  [facility];  Service: Cardiovascular;;  . Cardiac  catheterization N/A 09/30/2015    Procedure: Left Heart Cath and Coronary Angiography;  Surgeon: ChriBurnell Blanks;  [facility];  Service: Cardiovascular;  Laterality: N/A;    Current Medications: Outpatient Prescriptions Prior to Visit  Medication Sig Dispense Refill  . aspirin EC 81 MG tablet Take 81 mg by mouth daily.    . atMarland Kitchenrvastatin (LIPITOR) 80 MG tablet Take 1 tablet (80 mg total) by mouth daily at 6 PM. 30 tablet 6  . blood glucose meter kit and supplies Dispense based on patient and insurance preference. Use up to four times daily as directed. (FOR ICD-9 250.00, 250.01). 1 each 0  . carvedilol (COREG) 25 MG tablet Take 1 tablet (25 mg total) by mouth 2 (two) times daily with a meal. 60 tablet 6  . folic acid (FOLVITE) 1 MG tablet Take 1 mg by mouth daily.    . furosemide (LASIX) 40 MG tablet Take 1 tablet (40 mg total) by mouth 2 (two) times daily. 60 tablet 3  . hydrALAZINE (APRESOLINE) 25 MG tablet Take 1 tablet (25 mg total) by mouth every 8 (eight) hours. 90 tablet 6  . insulin glargine (LANTUS) 100 UNIT/ML injection Inject 0.2-0.25 mLs (20-25 Units total) into the skin 2 (two) times daily. Take 20 units every morning and 25 units at bedtime. 10 mL 11  . Insulin Syringes, Disposable, U-100 0.5 ML MISC Take Lantus insulin as directed    Diagnosis: Insulin dependent diabetes  mellitus ICD10 code Z79.4 100 each 4  . isosorbide mononitrate (IMDUR) 30 MG 24 hr tablet Take 1 tablet (30 mg total) by mouth daily. 30 tablet 6  . ivabradine (CORLANOR) 5 MG TABS tablet Take 1 tablet (5 mg total) by mouth 2 (two) times daily with a meal. 60 tablet 6  . metFORMIN (GLUCOPHAGE) 500 MG tablet Take 1 tablet (500 mg total) by mouth daily with breakfast. 30 tablet 5  . nitroGLYCERIN (NITROSTAT) 0.4 MG SL tablet Place 1 tablet (0.4 mg total) under the tongue every 5 (five) minutes as needed for chest pain. 25 tablet 3  . spironolactone (ALDACTONE) 25 MG tablet Take 1 tablet  (25 mg total) by mouth daily. 30 tablet 6   No facility-administered medications prior to visit.     Allergies:   Review of patient's allergies indicates no known allergies.   Social History   Social History  . Marital Status: Single    Spouse Name: N/A  . Number of Children: N/A  . Years of Education: N/A   Social History Main Topics  . Smoking status: Never Smoker   . Smokeless tobacco: Never Used  . Alcohol Use: No  . Drug Use: No  . Sexual Activity: No   Other Topics Concern  . Not on file   Social History Narrative     Family History:  The patient's ***family history includes Diabetes in his mother. There is no history of CAD.   ROS:   Please see the history of present illness.    ROS All other systems reviewed and are negative.   Physical Exam:    VS:  There were no vitals taken for this visit.   GEN: Well nourished, well developed, in no acute distress HEENT: normal Neck: no JVD, no masses Cardiac: Normal S1/S2, ***RRR; no murmurs, rubs, or gallops, no edema;  *** carotid bruits,   Respiratory:  clear to auscultation bilaterally; no wheezing, rhonchi or rales GI: soft, nontender, nondistended MS: no deformity or atrophy Skin: warm and dry Neuro: No focal deficits  Psych: Alert and oriented x 3, normal affect  Wt Readings from Last 3 Encounters:  10/02/15 192 lb 9.6 oz (87.363 kg)  05/11/14 203 lb 0.7 oz (92.1 kg)      Studies/Labs Reviewed:     EKG:  EKG is *** ordered today.  The ekg ordered today demonstrates ***  Recent Labs: 09/27/2015: ALT 24; B Natriuretic Peptide 388.9*; TSH 4.454 09/30/2015: Hemoglobin 12.9*; Platelets 213 10/02/2015: BUN 30*; Creatinine, Ser 1.53*; Potassium 4.1; Sodium 136   Recent Lipid Panel    Component Value Date/Time   CHOL 238* 09/28/2015 0525   TRIG 137 09/28/2015 0525   HDL 53 09/28/2015 0525   CHOLHDL 4.5 09/28/2015 0525   VLDL 27 09/28/2015 0525   LDLCALC 158* 09/28/2015 0525    Additional studies/  records that were reviewed today include:   *** LHC 09/30/15 1. Stable three vessel CAD 2. Patent stents mid LAD with minimal restenosis.  3. Patent stent mid Ramus with minimal restenosis. The small caliber sub-branch of the ramus is jailed by the mid Ramus stent.  4. Moderate stenosis in the RCA and Circumflex.  5. No focal targets for PCI.  6. Elevated filling pressures.   Recommendations: Continue medical management of CAD. He remains volume overloaded. Would continue IV diuresis.     Indications    Acute on chronic systolic CHF (congestive heart failure) (HCC) [I50.23 (ICD-10-CM)]   Elevated troponin [R79.89 (ICD-10-CM)]  Technique and Indications    Estimated blood loss <50 mL. Indication: 65 yo male with history of non-compliance, CAD with stenting of the LAD and Ramus with most recent stent October 2015 in the mid LAD in the old stent due to restenosis. Admitted with dyspnea and chest pain. LVEF=15%.   Procedure: The risks, benefits, complications, treatment options, and expected outcomes were discussed with the patient. The patient and/or family concurred with the proposed plan, giving informed consent. The patient was brought to the cath lab after IV hydration was begun and oral premedication was given. The patient was further sedated with Versed and Fentanyl. The right wrist was assessed with a modified Allens test which was positive. The right wrist was prepped and draped in a sterile fashion. 1% lidocaine was used for local anesthesia. Using the modified Seldinger access technique, a 5 French sheath was placed in the right radial artery. 3 mg Verapamil was given through the sheath. 4500 units IV heparin was given. Standard diagnostic catheters were used to perform selective coronary angiography. A pigtail catheter was used to cross the AV and measure LV pressures. No left ventricular angiogram was performed. The sheath was removed from the right radial artery and a Terumo  hemostasis band was applied at the arteriotomy site on the right wrist.   Sedation: During this procedure the patient is administered a total of Versed 2 mg and Fentanyl 50 mcg to achieve and maintain moderate conscious sedation. The patient's heart rate, blood pressure, and oxygen saturation are monitored continuously during the procedure. The period of conscious sedation is 16 minutes, of which I was present face-to-face 100% of this time.    Coronary Findings    Dominance: Right   Left Main   . LM lesion, 30% stenosed. Diffuse.     Left Anterior Descending   . Ost LAD to Prox LAD lesion, 50% stenosed. Discrete.   . Mid LAD to Dist LAD lesion, 30% stenosed. Diffuse. The lesion was previously treated with a drug-eluting stent one to two years ago.   . First Diagonal Branch   The vessel is moderate in size.   . First Septal Branch   The vessel is small in size.   Marland Kitchen Second Diagonal Branch   The vessel is small in size.   Marland Kitchen Second Septal Branch   The vessel is small in size.     Ramus Intermedius  . Vessel is large.   . Ramus-1 lesion, 30% stenosed. Discrete.   . Ramus-2 lesion, 30% stenosed. Diffuse. The lesion was previously treated with a stent (unknown type).   . Lateral Ramus Intermedius   The vessel is small in size.   . Lat Ramus lesion, 60% stenosed. Discrete.     Left Circumflex   . Prox Cx to Dist Cx lesion, 30% stenosed. Diffuse.   . Second Obtuse Marginal Branch   The vessel is small in size.   . 2nd Mrg lesion, 50% stenosed. Diffuse.     Right Coronary Artery   . Mid RCA lesion, 50% stenosed. Discrete.   . Dist RCA lesion, 60% stenosed. Diffuse.   . Right Posterior Descending Artery   The vessel is small in size.   Colon Flattery RPDA to RPDA lesion, 40% stenosed. Diffuse.   . Right Posterior Atrioventricular Branch   The vessel is small in size.   Marland Kitchen Post Atrio lesion, 40% stenosed. Diffuse.      Coronary Diagrams    Diagnostic Diagram  Implants    Echo 09/29/15 - Left ventricle: The cavity size was normal. There was moderate  concentric hypertrophy. Systolic function was severely reduced.  The estimated ejection fraction was in the range of 15% to 20%.  Akinesis of the basal-midanteroseptal myocardium. Features are  consistent with a pseudonormal left ventricular filling pattern,  with concomitant abnormal relaxation and increased filling  pressure (grade 2 diastolic dysfunction). - Aortic valve: There was trivial regurgitation. - Mitral valve: There was severe regurgitation. - Left atrium: The atrium was mildly dilated.   ASSESSMENT:     No diagnosis found.  PLAN:     In order of problems listed above:  1. ***   Medication Adjustments/Labs and Tests Ordered: Current medicines are reviewed at length with the patient today.  Concerns regarding medicines are outlined above.  Medication changes, Labs and Tests ordered today are outlined in the Patient Instructions noted below. There are no Patient Instructions on file for this visit. Signed, Richardson Dopp, PA-C  10/07/2015 10:10 PM    [facility] [place], [place], [place]  [address] Phone: [phone]; Fax: [phone]

## 2015-10-08 ENCOUNTER — Encounter: Payer: Medicaid Other | Admitting: Physician Assistant

## 2015-10-08 NOTE — Progress Notes (Signed)
Patient ID: Antonio Guerra, male   DOB: 02/19/1951, 65 y.o.   MRN: [id number] Erroneous encounter

## 2015-11-25 ENCOUNTER — Encounter: Payer: Self-pay | Admitting: Cardiology

## 2015-11-25 ENCOUNTER — Ambulatory Visit (INDEPENDENT_AMBULATORY_CARE_PROVIDER_SITE_OTHER): Payer: Medicaid Other | Admitting: Cardiology

## 2015-11-25 VITALS — BP 136/80 | HR 105 | Ht 73.0 in | Wt 195.4 lb

## 2015-11-25 DIAGNOSIS — E785 Hyperlipidemia, unspecified: Secondary | ICD-10-CM

## 2015-11-25 DIAGNOSIS — I10 Essential (primary) hypertension: Secondary | ICD-10-CM | POA: Diagnosis not present

## 2015-11-25 NOTE — Patient Instructions (Signed)
Medication Instructions:  Please make sure to take your medications as prescribed  Labwork: BMET today  Your physician recommends that you return for lab work on the same day you are seen in the hypertension clinic (Lipids, liver)   Testing/Procedures: None  Follow-Up: Your physician recommends that you schedule a follow-up appointment in: 2-3 weeks with our pharmacist in the hypertension clinic.  Your physician recommends that you schedule a follow-up appointment in: 2 months with Dr. Percival Spanish.    Any Other Special Instructions Will Be Listed Below (If Applicable).     If you need a refill on your cardiac medications before your next appointment, please call your pharmacy.

## 2015-11-25 NOTE — Addendum Note (Signed)
Addended by: Loren Racer on: 11/25/2015 04:57 PM   Modules accepted: Orders

## 2015-11-25 NOTE — Progress Notes (Signed)
11/25/2015 [name]   1951-06-11  [id number]  Primary Physician Benito Mccreedy, MD Primary Cardiologist: Dr. Percival Spanish   Reason for Visit/CC:   HPI:  Antonio Guerra is a 65 y.o. male with a history of CAD, ischemic cardiomyopathy, chronic systolic HF, HTN, stroke and DM, who presented to [facility] ED for evaluation of SOB and chest pain.    Patient was first followed in 2015 after he presented to the hospital with unstable angina. 2-D echocardiography was performed revealing an EF of 20-25% with multiple wall motion abnormalities. He underwent diagnostic catheterization May 11, 2014, revealing severe in-stent restenosis within the mid LAD and otherwise nonobstructive coronary artery disease. The LAD was successfully treated using a 3.5 x 38 mm Xience drug-eluting stent. He was lost to follow-up for the past 2 years and presented back to [facility] on 10/02/15 with recurrent unstable angina. EKG in the ED showed sinus tachycardia at a rate of 125bpm, LAFB, nonspecific ST and T-wave changes in the lateral lead, which appeared similar compared to prior EKG from 05/12/2014. Blood pressure was elevated at 176/118 upon presentation. Point-of-care troponin was slightly abnormal at 0.04. BNP 388.9. Creatinine 1.31. Blood glucose 224. Urine drug screen positive for marijuana. Chest was x-ray clear.  He was admitted for further workup. Cardiac enzymes are cycled but trend remained relatively flat. 2-D echocardiogram was arranged which showed a left ventricular ejection fraction of 15-20%. This was previously 20-25% in 2015. Decision was made to reevaluate his coronaries by left heart catheterization. He was found to have stable CAD with patent stents (report outlined below). He was also treated for hypertensive urgency during his hospitalization. He was continued on carvedilol, Imdur, hydralazine Lipitor and aspirin. Spironolactone was added to his medication regimen as well as Corlanor for  tachycardia.   He now presents back to clinic for post hospital follow-up. EKG shows sinus tach. HR is 105 bpm. He continues to have mild dyspnea on exertion but no [name]. He notes stable 2 pillow orthopnea. No PND. He has been taking his meds incorrectly. He has only been taking Coreg, Corlanor and Hydralazine once daily. He has been compliant with daily weight but has not maintained a strict low sodium diet.     [facility] Procedures    Left Heart Cath and Coronary Angiography    Conclusion    1. Stable three vessel CAD 2. Patent stents mid LAD with minimal restenosis.  3. Patent stent mid Ramus with minimal restenosis. The small caliber sub-branch of the ramus is jailed by the mid Ramus stent.  4. Moderate stenosis in the RCA and Circumflex.  5. No focal targets for PCI.  6. Elevated filling pressures.   Recommendations: Continue medical management of CAD     Current Outpatient Prescriptions  Medication Sig Dispense Refill  . aspirin EC 81 MG tablet Take 81 mg by mouth daily.    Marland Kitchen atorvastatin (LIPITOR) 80 MG tablet Take 1 tablet (80 mg total) by mouth daily at 6 PM. 30 tablet 6  . blood glucose meter kit and supplies Dispense based on patient and insurance preference. Use up to four times daily as directed. (FOR ICD-9 250.00, 250.01). 1 each 0  . carvedilol (COREG) 25 MG tablet Take 1 tablet (25 mg total) by mouth 2 (two) times daily with a meal. 60 tablet 6  . folic acid (FOLVITE) 1 MG tablet Take 1 mg by mouth daily.    . furosemide (LASIX) 40 MG tablet Take 1 tablet (40 mg total) by  mouth 2 (two) times daily. 60 tablet 3  . hydrALAZINE (APRESOLINE) 25 MG tablet Take 1 tablet (25 mg total) by mouth every 8 (eight) hours. 90 tablet 6  . insulin glargine (LANTUS) 100 UNIT/ML injection Inject 0.2-0.25 mLs (20-25 Units total) into the skin 2 (two) times daily. Take 20 units every morning and 25 units at bedtime. 10 mL 11  . Insulin Syringes, Disposable, U-100 0.5 ML MISC Take Lantus  insulin as directed    Diagnosis: Insulin dependent diabetes mellitus ICD10 code Z79.4 100 each 4  . isosorbide mononitrate (IMDUR) 30 MG 24 hr tablet Take 1 tablet (30 mg total) by mouth daily. 30 tablet 6  . ivabradine (CORLANOR) 5 MG TABS tablet Take 1 tablet (5 mg total) by mouth 2 (two) times daily with a meal. 60 tablet 6  . metFORMIN (GLUCOPHAGE) 500 MG tablet Take 1 tablet (500 mg total) by mouth daily with breakfast. 30 tablet 5  . nitroGLYCERIN (NITROSTAT) 0.4 MG SL tablet Place 1 tablet (0.4 mg total) under the tongue every 5 (five) minutes as needed for chest pain. 25 tablet 3  . spironolactone (ALDACTONE) 25 MG tablet Take 1 tablet (25 mg total) by mouth daily. 30 tablet 6   No current facility-administered medications for this visit.    No Known Allergies  Social History   Social History  . Marital Status: Single    Spouse Name: N/A  . Number of Children: N/A  . Years of Education: N/A   Occupational History  . Not on file.   Social History Main Topics  . Smoking status: Never Smoker   . Smokeless tobacco: Never Used  . Alcohol Use: No  . Drug Use: No  . Sexual Activity: No   Other Topics Concern  . Not on file   Social History Narrative     Review of Systems: General: negative for chills, fever, night sweats or weight changes.  Cardiovascular: negative for chest pain, dyspnea on exertion, edema, orthopnea, palpitations, paroxysmal nocturnal dyspnea or shortness of breath Dermatological: negative for rash Respiratory: negative for cough or wheezing Urologic: negative for hematuria Abdominal: negative for nausea, vomiting, diarrhea, bright red blood per rectum, melena, or hematemesis Neurologic: negative for visual changes, syncope, or dizziness All other systems reviewed and are otherwise negative except as noted above.    There were no vitals taken for this visit.  General appearance: alert, cooperative and no distress Neck: no carotid bruit and no  JVD Lungs: clear to auscultation bilaterally Heart: regular rate and rhythm Extremities: no [name] Pulses: 2+ and symmetric Skin: warm and dry Neurologic: Grossly normal  EKG not performed  ASSESSMENT AND PLAN:   1. CAD: stable on recent cath with patent stents. Continue medical management. ASA, BB, statin, Imdur.   2. Ischemic Cardiomyopathy/chronic Systolic HF: EF 38-18%. Stable cath. Euvolemic on physical exam. Continue medical therapy. He has been taking meds incorrectly. He has only been taking Coreg, Corlanor and hydralazine once daily. We reviewed his meds together with his daughter. Patient was sent home with new medication list. Will keep Corlanor dose at 5 mg since he has only been taking 1x/day. Continue daily weights. Low sodium diet recommended.   3. HTN: decently controlled in the 299B systolic, despite the fact that he has been taking his meds incorrectly.   4. HLD: on statin therapy. LDL recently checked and was not at goal. Statin recently increased. It has been 6 weeks since his last assessment . Will plan for repeat FLP  and HFTs.   5. DM: per PCP.   6. Medication Noncompliance: Patient was having difficulty with his medication instructions, particularly his BID and TID meds. We reviewed his medication list today with his daughter. Cardiac meds were highlighted with recommended doses. Patient verbalized understanding. Reheck HR, BP and med compliance in 2 weeks with office pharmacist.   7. Medication monitoring. Will check a BMP to assess renal function and K given Spirolactone was recently added.    PLAN  Repeat f/u with office pharmacist in 2 weeks to reassess HR, BP and medication compliance. If HR is still >70 bpm after full compliance with Coreg and Corlanor, then will need to increase dose to 7.5 mg BID. F/u with Dr. Percival Spanish in 2-3 months.   Lyda Jester PA-C 11/25/2015 3:50 PM

## 2015-11-26 LAB — BASIC METABOLIC PANEL
BUN: 19 mg/dL (ref 7–25)
CHLORIDE: 101 mmol/L (ref 98–110)
CO2: 25 mmol/L (ref 20–31)
Calcium: 9.5 mg/dL (ref 8.6–10.3)
Creat: 1.35 mg/dL — ABNORMAL HIGH (ref 0.70–1.25)
GLUCOSE: 221 mg/dL — AB (ref 65–99)
Potassium: 4.2 mmol/L (ref 3.5–5.3)
SODIUM: 136 mmol/L (ref 135–146)

## 2015-12-10 ENCOUNTER — Other Ambulatory Visit: Payer: Medicaid Other

## 2015-12-10 ENCOUNTER — Encounter: Payer: Medicaid Other | Admitting: Pharmacist

## 2015-12-10 NOTE — Progress Notes (Signed)
12/10/2015 [name]   February 27, 1951  [id number]  Primary Physician OSEI-BONSU,GEORGE, MD Primary Cardiologist: Dr. Percival Spanish   Reason for Visit/CC:   HPI:  Antonio Guerra is a 65 y.o. male patient of Dr. Percival Spanish with a history of CAD s/p PCI to LAD in 2015, ischemic cardiomyopathy, chronic systolic HF, HTN, stroke and DM, who presented to [facility] ED for evaluation of SOB and chest pain on 09/27/15.  His echo showed an EF of 15-20% and cath with stable CAD.  His BP was also elevated during the hospitalization.  He was discharged on carvediolol, Imdur, hydralazine, Lipitor, ASA, spironolactone, and Corlanor. ACE-I/ARB was not used due to concern over renal function.  He was seen  In follow up on 11/25/15.  At that time it was discovered that he was not taking his medications correctly.  He was taking his Coreg, Corlanor and Hydralazine once daily.  He was educated on the correct dosing times and asked to follow up today.         Current Outpatient Prescriptions  Medication Sig Dispense Refill  . aspirin EC 81 MG tablet Take 81 mg by mouth daily.    Marland Kitchen atorvastatin (LIPITOR) 80 MG tablet Take 1 tablet (80 mg total) by mouth daily at 6 PM. 30 tablet 6  . blood glucose meter kit and supplies Dispense based on patient and insurance preference. Use up to four times daily as directed. (FOR ICD-9 250.00, 250.01). 1 each 0  . carvedilol (COREG) 25 MG tablet Take 1 tablet (25 mg total) by mouth 2 (two) times daily with a meal. 60 tablet 6  . folic acid (FOLVITE) 1 MG tablet Take 1 mg by mouth daily.    . furosemide (LASIX) 40 MG tablet Take 1 tablet (40 mg total) by mouth 2 (two) times daily. 60 tablet 3  . hydrALAZINE (APRESOLINE) 25 MG tablet Take 1 tablet (25 mg total) by mouth every 8 (eight) hours. 90 tablet 6  . insulin glargine (LANTUS) 100 UNIT/ML injection Inject 0.2-0.25 mLs (20-25 Units total) into the skin 2 (two) times daily. Take 20 units every morning and 25 units at bedtime. 10 mL 11  .  Insulin Syringes, Disposable, U-100 0.5 ML MISC Take Lantus insulin as directed    Diagnosis: Insulin dependent diabetes mellitus ICD10 code Z79.4 100 each 4  . isosorbide mononitrate (IMDUR) 30 MG 24 hr tablet Take 1 tablet (30 mg total) by mouth daily. 30 tablet 6  . ivabradine (CORLANOR) 5 MG TABS tablet Take 1 tablet (5 mg total) by mouth 2 (two) times daily with a meal. 60 tablet 6  . metFORMIN (GLUCOPHAGE) 500 MG tablet Take 1 tablet (500 mg total) by mouth daily with breakfast. 30 tablet 5  . nitroGLYCERIN (NITROSTAT) 0.4 MG SL tablet Place 1 tablet (0.4 mg total) under the tongue every 5 (five) minutes as needed for chest pain. 25 tablet 3  . spironolactone (ALDACTONE) 25 MG tablet Take 1 tablet (25 mg total) by mouth daily. 30 tablet 6   No current facility-administered medications for this visit.    No Known Allergies  Social History   Social History  . Marital Status: Single    Spouse Name: N/A  . Number of Children: N/A  . Years of Education: N/A   Occupational History  . Not on file.   Social History Main Topics  . Smoking status: Never Smoker   . Smokeless tobacco: Never Used  . Alcohol Use: No  . Drug Use: No  .  Sexual Activity: No   Other Topics Concern  . Not on file   Social History Narrative    ASSESSMENT AND PLAN:   1. CAD: stable on recent cath with patent stents. Continue medical management. ASA, BB, statin, Imdur.   2. Ischemic Cardiomyopathy/chronic Systolic HF: EF 04-88%. Stable cath. Euvolemic on physical exam. Continue medical therapy. He has been taking meds incorrectly. He has only been taking Coreg, Corlanor and hydralazine once daily. We reviewed his meds together with his daughter. Patient was sent home with new medication list. Will keep Corlanor dose at 5 mg since he has only been taking 1x/day. Continue daily weights. Low sodium diet recommended.   3. HTN: decently controlled in the 891Q systolic, despite the fact that he has been taking  his meds incorrectly.   4. HLD: on statin therapy. LDL recently checked and was not at goal. Statin recently increased. It has been 6 weeks since his last assessment . Will plan for repeat FLP and HFTs.   5. DM: per PCP.   6. Medication Noncompliance: Patient was having difficulty with his medication instructions, particularly his BID and TID meds. We reviewed his medication list today with his daughter. Cardiac meds were highlighted with recommended doses. Patient verbalized understanding. Reheck HR, BP and med compliance in 2 weeks with office pharmacist.   7. Medication monitoring. Will check a BMP to assess renal function and K given Spirolactone was recently added.    PLAN  Repeat f/u with office pharmacist in 2 weeks to reassess HR, BP and medication compliance. If HR is still >70 bpm after full compliance with Coreg and Corlanor, then will need to increase dose to 7.5 mg BID. F/u with Dr. Percival Spanish in 2-3 months.   Elberta Leatherwood PharmD, BCPS, CPP 12/10/2015 9:51 AM  This encounter was created in error - please disregard.

## 2015-12-18 ENCOUNTER — Observation Stay (HOSPITAL_COMMUNITY)
Admission: EM | Admit: 2015-12-18 | Discharge: 2015-12-21 | Disposition: A | Discharge status: Home / Self Care | Payer: Medicaid Other | Attending: Internal Medicine | Admitting: Internal Medicine

## 2015-12-18 ENCOUNTER — Encounter (HOSPITAL_COMMUNITY): Payer: Self-pay | Admitting: *Deleted

## 2015-12-18 ENCOUNTER — Emergency Department (HOSPITAL_COMMUNITY): Payer: Medicaid Other

## 2015-12-18 DIAGNOSIS — R29898 Other symptoms and signs involving the musculoskeletal system: Secondary | ICD-10-CM

## 2015-12-18 DIAGNOSIS — Z7984 Long term (current) use of oral hypoglycemic drugs: Secondary | ICD-10-CM | POA: Diagnosis not present

## 2015-12-18 DIAGNOSIS — I2510 Atherosclerotic heart disease of native coronary artery without angina pectoris: Secondary | ICD-10-CM | POA: Diagnosis not present

## 2015-12-18 DIAGNOSIS — I160 Hypertensive urgency: Secondary | ICD-10-CM | POA: Diagnosis not present

## 2015-12-18 DIAGNOSIS — Z8701 Personal history of pneumonia (recurrent): Secondary | ICD-10-CM | POA: Insufficient documentation

## 2015-12-18 DIAGNOSIS — Z79899 Other long term (current) drug therapy: Secondary | ICD-10-CM | POA: Diagnosis not present

## 2015-12-18 DIAGNOSIS — R079 Chest pain, unspecified: Secondary | ICD-10-CM | POA: Diagnosis present

## 2015-12-18 DIAGNOSIS — E119 Type 2 diabetes mellitus without complications: Secondary | ICD-10-CM | POA: Insufficient documentation

## 2015-12-18 DIAGNOSIS — Z8673 Personal history of transient ischemic attack (TIA), and cerebral infarction without residual deficits: Secondary | ICD-10-CM | POA: Insufficient documentation

## 2015-12-18 DIAGNOSIS — W19XXXA Unspecified fall, initial encounter: Secondary | ICD-10-CM

## 2015-12-18 DIAGNOSIS — Z794 Long term (current) use of insulin: Secondary | ICD-10-CM | POA: Insufficient documentation

## 2015-12-18 DIAGNOSIS — R2990 Unspecified symptoms and signs involving the nervous system: Secondary | ICD-10-CM

## 2015-12-18 DIAGNOSIS — R1013 Epigastric pain: Secondary | ICD-10-CM

## 2015-12-18 DIAGNOSIS — E785 Hyperlipidemia, unspecified: Secondary | ICD-10-CM | POA: Diagnosis not present

## 2015-12-18 DIAGNOSIS — Z7982 Long term (current) use of aspirin: Secondary | ICD-10-CM | POA: Diagnosis not present

## 2015-12-18 DIAGNOSIS — M1990 Unspecified osteoarthritis, unspecified site: Secondary | ICD-10-CM | POA: Diagnosis not present

## 2015-12-18 DIAGNOSIS — I255 Ischemic cardiomyopathy: Secondary | ICD-10-CM | POA: Diagnosis not present

## 2015-12-18 DIAGNOSIS — M797 Fibromyalgia: Secondary | ICD-10-CM | POA: Diagnosis not present

## 2015-12-18 DIAGNOSIS — F10929 Alcohol use, unspecified with intoxication, unspecified: Secondary | ICD-10-CM | POA: Diagnosis present

## 2015-12-18 DIAGNOSIS — F19980 Other psychoactive substance use, unspecified with psychoactive substance-induced anxiety disorder: Secondary | ICD-10-CM | POA: Diagnosis present

## 2015-12-18 DIAGNOSIS — I5022 Chronic systolic (congestive) heart failure: Secondary | ICD-10-CM | POA: Insufficient documentation

## 2015-12-18 DIAGNOSIS — G8929 Other chronic pain: Secondary | ICD-10-CM | POA: Diagnosis not present

## 2015-12-18 DIAGNOSIS — I639 Cerebral infarction, unspecified: Secondary | ICD-10-CM

## 2015-12-18 DIAGNOSIS — E1159 Type 2 diabetes mellitus with other circulatory complications: Secondary | ICD-10-CM

## 2015-12-18 DIAGNOSIS — Z9861 Coronary angioplasty status: Secondary | ICD-10-CM | POA: Insufficient documentation

## 2015-12-18 DIAGNOSIS — R4182 Altered mental status, unspecified: Secondary | ICD-10-CM | POA: Insufficient documentation

## 2015-12-18 DIAGNOSIS — I252 Old myocardial infarction: Secondary | ICD-10-CM | POA: Insufficient documentation

## 2015-12-18 DIAGNOSIS — R531 Weakness: Secondary | ICD-10-CM

## 2015-12-18 DIAGNOSIS — I10 Essential (primary) hypertension: Secondary | ICD-10-CM | POA: Diagnosis not present

## 2015-12-18 DIAGNOSIS — R072 Precordial pain: Secondary | ICD-10-CM | POA: Diagnosis present

## 2015-12-18 HISTORY — DX: Low back pain: M54.5

## 2015-12-18 HISTORY — DX: Fibromyalgia: M79.7

## 2015-12-18 HISTORY — DX: Other chronic pain: G89.29

## 2015-12-18 HISTORY — DX: Type 2 diabetes mellitus without complications: E11.9

## 2015-12-18 HISTORY — DX: Low back pain, unspecified: M54.50

## 2015-12-18 HISTORY — DX: Acute myocardial infarction, unspecified: I21.9

## 2015-12-18 HISTORY — DX: Unspecified osteoarthritis, unspecified site: M19.90

## 2015-12-18 LAB — CBC
HCT: 38.4 % — ABNORMAL LOW (ref 39.0–52.0)
Hemoglobin: 13.6 g/dL (ref 13.0–17.0)
MCH: 28.4 pg (ref 26.0–34.0)
MCHC: 35.4 g/dL (ref 30.0–36.0)
MCV: 80.2 fL (ref 78.0–100.0)
PLATELETS: 247 10*3/uL (ref 150–400)
RBC: 4.79 MIL/uL (ref 4.22–5.81)
RDW: 13.2 % (ref 11.5–15.5)
WBC: 5.6 10*3/uL (ref 4.0–10.5)

## 2015-12-18 LAB — DIFFERENTIAL
Basophils Absolute: 0 10*3/uL (ref 0.0–0.1)
Basophils Relative: 0 %
EOS PCT: 0 %
Eosinophils Absolute: 0 10*3/uL (ref 0.0–0.7)
LYMPHS ABS: 2.1 10*3/uL (ref 0.7–4.0)
LYMPHS PCT: 37 %
MONO ABS: 0.4 10*3/uL (ref 0.1–1.0)
Monocytes Relative: 7 %
NEUTROS ABS: 3.1 10*3/uL (ref 1.7–7.7)
Neutrophils Relative %: 56 %

## 2015-12-18 LAB — BASIC METABOLIC PANEL
Anion gap: 13 (ref 5–15)
BUN: 15 mg/dL (ref 6–20)
CALCIUM: 9.1 mg/dL (ref 8.9–10.3)
CHLORIDE: 98 mmol/L — AB (ref 101–111)
CO2: 18 mmol/L — AB (ref 22–32)
CREATININE: 1.27 mg/dL — AB (ref 0.61–1.24)
GFR calc Af Amer: >60 mL/min (ref >60)
GFR calc non Af Amer: 58 mL/min — ABNORMAL LOW (ref >60)
GLUCOSE: 291 mg/dL — AB (ref 65–99)
Potassium: 3.6 mmol/L (ref 3.5–5.1)
Sodium: 129 mmol/L — ABNORMAL LOW (ref 135–145)

## 2015-12-18 LAB — I-STAT TROPONIN, ED: TROPONIN I, POC: 0.02 ng/mL (ref 0.00–0.08)

## 2015-12-18 LAB — ETHANOL: Alcohol, Ethyl (B): 167 mg/dL — ABNORMAL HIGH (ref <5)

## 2015-12-18 LAB — BRAIN NATRIURETIC PEPTIDE: B Natriuretic Peptide: 169.8 pg/mL — ABNORMAL HIGH (ref 0.0–100.0)

## 2015-12-18 MED ORDER — LORAZEPAM 1 MG PO TABS: 1.0000 mg | ORAL_TABLET | Freq: Four times a day (QID) | ORAL | Status: DC | PRN

## 2015-12-18 MED ORDER — THIAMINE HCL 100 MG/ML IJ SOLN: 100.0000 mg | Freq: Every day | INTRAMUSCULAR | Status: DC

## 2015-12-18 MED ORDER — FOLIC ACID 1 MG PO TABS
1.0000 mg | ORAL_TABLET | Freq: Every day | ORAL | Status: DC
  Administered 2015-12-19 – 2015-12-21 (×3): 1 mg via ORAL
  Filled 2015-12-18 (×3): qty 1

## 2015-12-18 MED ORDER — ONDANSETRON HCL 4 MG/2ML IJ SOLN: 4.0000 mg | Freq: Three times a day (TID) | INTRAMUSCULAR | Status: AC | PRN

## 2015-12-18 MED ORDER — ADULT MULTIVITAMIN W/MINERALS CH
1.0000 | ORAL_TABLET | Freq: Every day | ORAL | Status: DC
  Administered 2015-12-19 – 2015-12-21 (×3): 1 via ORAL
  Filled 2015-12-18 (×3): qty 1

## 2015-12-18 MED ORDER — LORAZEPAM 2 MG/ML IJ SOLN: 1.0000 mg | Freq: Four times a day (QID) | INTRAMUSCULAR | Status: DC | PRN

## 2015-12-18 MED ORDER — VITAMIN B-1 100 MG PO TABS
100.0000 mg | ORAL_TABLET | Freq: Every day | ORAL | Status: DC
  Administered 2015-12-19 – 2015-12-21 (×3): 100 mg via ORAL
  Filled 2015-12-18 (×3): qty 1

## 2015-12-18 MED ORDER — ASPIRIN 81 MG PO CHEW: 324.0000 mg | CHEWABLE_TABLET | Freq: Once | ORAL | Status: DC

## 2015-12-18 MED ORDER — LORAZEPAM 2 MG/ML IJ SOLN: 0.0000 mg | Freq: Two times a day (BID) | INTRAMUSCULAR | Status: DC

## 2015-12-18 MED ORDER — LORAZEPAM 2 MG/ML IJ SOLN
0.0000 mg | Freq: Four times a day (QID) | INTRAMUSCULAR | Status: AC
  Administered 2015-12-18 – 2015-12-20 (×4): 2 mg via INTRAVENOUS
  Filled 2015-12-18 (×4): qty 1

## 2015-12-18 MED ORDER — FENTANYL CITRATE (PF) 100 MCG/2ML IJ SOLN
50.0000 ug | Freq: Once | INTRAMUSCULAR | Status: AC
  Administered 2015-12-18: 50 ug via INTRAVENOUS
  Filled 2015-12-18: qty 2

## 2015-12-18 MED ORDER — NITROGLYCERIN 0.4 MG SL SUBL
0.4000 mg | SUBLINGUAL_TABLET | SUBLINGUAL | Status: DC | PRN
  Administered 2015-12-18 (×3): 0.4 mg via SUBLINGUAL
  Filled 2015-12-18 (×2): qty 1

## 2015-12-18 MED ORDER — HYDROMORPHONE HCL 1 MG/ML IJ SOLN: 1.0000 mg | INTRAMUSCULAR | Status: DC | PRN

## 2015-12-18 NOTE — ED Notes (Signed)
Pt arrives via EMS from home c/o chest pain. EMS reports pt has hx heart attacks and stroke with right sided deficit. Pt states he drank 2 40oz beers and then his chest pain started. EMS gave 324 ASA and 1 NTG.

## 2015-12-18 NOTE — ED Provider Notes (Signed)
CSN: [id number]     Arrival date & time 12/18/15  1930 History   First MD Initiated Contact with Patient 12/18/15 1934     Chief Complaint  Patient presents with  . Chest Pain     (Consider location/radiation/quality/duration/timing/severity/associated sxs/prior Treatment) HPI 65 old male who presents with chest pain. He is extensive cardiac history including CAD with multiple stents, ischemic cardiomyopathy with EF of 20-25%, hypertension, diabetes. History limited due to alcohol intoxication. Supported history is provided by patient's daughter. They report that he has been in his usual state of health up until today. He has had multiple alcoholic beverages today. While sitting out on a chair, he reports having sudden onset of severe stabbing chest pain that radiated from the left right side of his chest. It is associated with nausea, diaphoresis, and episode of near syncope. His daughter thinks that he may have passed out for 1 second is he was briefly unresponsive. He states that this is similar pain to when he has had his prior MI before. He reports dyspnea on exertion, which he states is stable. He has not had lower extremity edema. He is still has stable 2 pillow orthopnea. EMS called by family. He was brought to ED,   Past Medical History  Diagnosis Date  . Coronary artery disease     a. 2011 MI x 2 with PCI: stent x 2 (LAD and RI) @ [facility] in [place], [place];  b. 04/2014 Cath/PCI: LM 10-20, LAD 40p, 5m 867mSR(3.5x38 Xience DES), 50apical, LCX 20 diffuse, RI 50p, 2052mR, RCA 40-74m28md.  . Diabetes mellitus without complication (HCC)[place]. Hypertension   [facility]  [facility]  . Hyperlipidemia   . Ischemic cardiomyopathy     a. 04/2014 Echo: EF 20-25%, anteroseptal, inferior, & basal-mid inferoseptal AK. Sev apical/ant HK, Ao sclerosis, mild MR, triv PI.  . ChMarland Kitchenonic systolic congestive heart failure (HCC)[place]  a. 04/2014 Echo: EF 20-25%.  . HyMarland Kitchenertensive urgency 09/27/2015   . Acute on chronic combined systolic (congestive) and diastolic (congestive) heart failure (HCC)[place]10/2017   Past Surgical History  Procedure Laterality Date  . Ankle surgery    . Coronary stent placement    . Left heart catheterization with coronary/graft angiogram N/A 05/11/2014    Procedure: LEFT HEART CATHETERIZATION WITH COROBeatrix Fettersurgeon: ChriBurnell Blanks;  [facility];  Service: Cardiovascular;  Laterality: N/A;  . Percutaneous coronary stent intervention (pci-s)  05/11/2014    Procedure: PERCUTANEOUS CORONARY STENT INTERVENTION (PCI-S);  Surgeon: ChriBurnell Blanks;  [facility];  Service: Cardiovascular;;  . Fractional flow reserve wire  05/11/2014    Procedure: FRACTIONAL FLOW RESERVE WIRE;  Surgeon: ChriBurnell Blanks;  [facility];  Service: Cardiovascular;;  . Cardiac catheterization N/A 09/30/2015    Procedure: Left Heart Cath and Coronary Angiography;  Surgeon: ChriBurnell Blanks;  [facility];  Service: Cardiovascular;  Laterality: N/A;   Family History  Problem Relation Age of Onset  . Diabetes Mother   . CAD Neg Hx    Social History  Substance Use Topics  . Smoking status: Never Smoker   . Smokeless tobacco: Never Used  . Alcohol Use: No    Review of Systems 10/14 systems reviewed and are negative other than those stated in the HPI    Allergies  Tape  Home Medications   Prior to Admission medications  Medication Sig Start Date End Date Taking? Authorizing Provider  ACCU-CHEK AVIVA PLUS test strip Use as directed 10/02/15  Yes Historical Provider, MD  ACCU-CHEK SOFTCLIX LANCETS lancets Use as directed 10/02/15  Yes Historical Provider, MD  aspirin EC 81 MG tablet Take 81 mg by mouth daily.   Yes Historical Provider, MD  blood glucose meter kit and supplies Dispense based on patient and insurance preference. Use up to four times daily as directed. (FOR ICD-9  250.00, 250.01). 10/02/15  Yes Ripudeep K Rai, MD  carvedilol (COREG) 25 MG tablet Take 1 tablet (25 mg total) by mouth 2 (two) times daily with a meal. 05/12/14  Yes Brett Canales, PA-C  folic acid (FOLVITE) 1 MG tablet Take 1 mg by mouth daily.   Yes Historical Provider, MD  furosemide (LASIX) 40 MG tablet Take 1 tablet (40 mg total) by mouth 2 (two) times daily. 10/02/15  Yes Bhavinkumar Bhagat, PA  hydrALAZINE (APRESOLINE) 25 MG tablet Take 1 tablet (25 mg total) by mouth every 8 (eight) hours. Patient taking differently: Take 25 mg by mouth daily.  10/02/15  Yes Bhavinkumar Bhagat, PA  insulin glargine (LANTUS) 100 UNIT/ML injection Inject 0.2-0.25 mLs (20-25 Units total) into the skin 2 (two) times daily. Take 20 units every morning and 25 units at bedtime. 10/02/15  Yes Ripudeep Krystal Eaton, MD  Insulin Syringe-Needle U-100 (INSULIN SYRINGE .5CC/31GX5/16") 31G X 5/16" 0.5 ML MISC Use as directed 10/02/15  Yes Historical Provider, MD  Insulin Syringes, Disposable, U-100 0.5 ML MISC Take Lantus insulin as directed    Diagnosis: Insulin dependent diabetes mellitus ICD10 code Z79.4 10/02/15  Yes Ripudeep K Rai, MD  isosorbide mononitrate (IMDUR) 30 MG 24 hr tablet Take 1 tablet (30 mg total) by mouth daily. 10/02/15  Yes Bhavinkumar Bhagat, PA  ivabradine (CORLANOR) 5 MG TABS tablet Take 1 tablet (5 mg total) by mouth 2 (two) times daily with a meal. 10/02/15  Yes Bhavinkumar Bhagat, PA  metFORMIN (GLUCOPHAGE) 500 MG tablet Take 1 tablet (500 mg total) by mouth daily with breakfast. 10/02/15  Yes Ripudeep K Rai, MD  nitroGLYCERIN (NITROSTAT) 0.4 MG SL tablet Place 1 tablet (0.4 mg total) under the tongue every 5 (five) minutes as needed for chest pain. 05/12/14  Yes Brett Canales, PA-C  spironolactone (ALDACTONE) 25 MG tablet Take 1 tablet (25 mg total) by mouth daily. 10/02/15  Yes Bhavinkumar Bhagat, PA  atorvastatin (LIPITOR) 80 MG tablet Take 1 tablet (80 mg total) by mouth daily at 6 PM. Patient not  taking: Reported on 12/18/2015 05/12/14   Einar Pheasant Hager, PA-C   BP 152/93 mmHg  Pulse 102  Temp(Src) 98.4 F (36.9 C) (Oral)  Resp 22  Ht '6\' 1"'$  (1.854 m)  Wt 193 lb 11.2 oz (87.862 kg)  BMI 25.56 kg/m2  SpO2 100% Physical Exam Physical Exam  Nursing note and vitals reviewed. Constitutional: Well developed, well nourished, non-toxic, and in no acute distress, appears mildly intoxicated and need frequent redirection Head: Normocephalic and atraumatic.  Mouth/Throat: Oropharynx is clear and moist.  Neck: Normal range of motion. Neck supple.  Cardiovascular: Tachycardic rate and regular rhythm.   Pulmonary/Chest: Effort normal and breath sounds normal.  Abdominal: Soft. There is no tenderness. There is no rebound and no guarding.  Musculoskeletal: Normal range of motion. No edema. Neurological: Alert, no facial droop, fluent speech, moves all extremities symmetrically Skin: Skin is warm and dry.  Psychiatric: Cooperative  ED Course  Procedures (including critical care time) Labs  Review Labs Reviewed  BASIC METABOLIC PANEL - Abnormal; Notable for the following:    Sodium 129 (*)    Chloride 98 (*)    CO2 18 (*)    Glucose, Bld 291 (*)    Creatinine, Ser 1.27 (*)    GFR calc non Af Amer 58 (*)    All other components within normal limits  CBC - Abnormal; Notable for the following:    HCT 38.4 (*)    All other components within normal limits  BRAIN NATRIURETIC PEPTIDE - Abnormal; Notable for the following:    B Natriuretic Peptide 169.8 (*)    All other components within normal limits  ETHANOL - Abnormal; Notable for the following:    Alcohol, Ethyl (B) 167 (*)    All other components within normal limits  MRSA PCR SCREENING  DIFFERENTIAL  Randolm Idol, ED    Imaging Review Dg Chest 2 View  12/18/2015  CLINICAL DATA:  Shortness of breath. Left-sided intermittent chest pain. EXAM: CHEST  2 VIEW COMPARISON:  September 27, 2015 FINDINGS: The heart, hila, and mediastinum are  unchanged. There is a mildly tortuous thoracic aorta which is stable. No pulmonary nodules or masses. No focal infiltrates. IMPRESSION: No acute abnormalities or changes identified. Electronically Signed   By: Dorise Bullion III M.D   On: 12/18/2015 20:41   I have personally reviewed and evaluated these images and lab results as part of my medical decision-making.   EKG Interpretation   Date/Time:  Wednesday Dec 18 2015 19:30:45 EDT Ventricular Rate:  115 PR Interval:  141 QRS Duration: 110 QT Interval:  339 QTC Calculation: 469 R Axis:   -55 Text Interpretation:  Sinus tachycardia Probable left atrial enlargement  Left anterior fascicular block LVH with secondary repolarization  abnormality Anterior Q waves, possibly due to LVH inferolateral ischemic  changs seen on prior EKG  No acute changes Confirmed by Boneta Standre MD, Hinton Dyer  ([id number]) on 12/18/2015 7:35:41 PM      MDM   Final diagnoses:  Chest pain, unspecified chest pain type    65 year old male with extensive cardiac history and ICMP who presents with chest pain and ? Syncopal episode. High risk chest pain. EKG not acutely ischemic with chronic ST changes in inferolateral leads. Troponin x 1 negative. Received full dose ASA, and given NGL in ED for ongoing chest pain. Difficult historian given some component of alcohol intoxication, alcohol level 168 today. CXR without acute cardiopulmonary process. High risk for arrhythmia given ICMP and pain is his reported anginal equivalent. Discussed with Dr. Hal Hope who will admit for further cardiac w/u and management.    Forde Dandy, MD 12/19/15 0100

## 2015-12-19 ENCOUNTER — Observation Stay (HOSPITAL_COMMUNITY): Payer: Medicaid Other

## 2015-12-19 ENCOUNTER — Encounter (HOSPITAL_COMMUNITY): Payer: Self-pay | Admitting: Internal Medicine

## 2015-12-19 DIAGNOSIS — I25118 Atherosclerotic heart disease of native coronary artery with other forms of angina pectoris: Secondary | ICD-10-CM | POA: Diagnosis not present

## 2015-12-19 DIAGNOSIS — F10120 Alcohol abuse with intoxication, uncomplicated: Secondary | ICD-10-CM | POA: Diagnosis not present

## 2015-12-19 DIAGNOSIS — F19980 Other psychoactive substance use, unspecified with psychoactive substance-induced anxiety disorder: Secondary | ICD-10-CM | POA: Diagnosis present

## 2015-12-19 DIAGNOSIS — R079 Chest pain, unspecified: Secondary | ICD-10-CM

## 2015-12-19 DIAGNOSIS — I10 Essential (primary) hypertension: Secondary | ICD-10-CM | POA: Diagnosis not present

## 2015-12-19 DIAGNOSIS — R1013 Epigastric pain: Secondary | ICD-10-CM | POA: Insufficient documentation

## 2015-12-19 DIAGNOSIS — I5022 Chronic systolic (congestive) heart failure: Secondary | ICD-10-CM | POA: Diagnosis not present

## 2015-12-19 DIAGNOSIS — I2510 Atherosclerotic heart disease of native coronary artery without angina pectoris: Secondary | ICD-10-CM

## 2015-12-19 DIAGNOSIS — F10929 Alcohol use, unspecified with intoxication, unspecified: Secondary | ICD-10-CM | POA: Diagnosis present

## 2015-12-19 DIAGNOSIS — E1159 Type 2 diabetes mellitus with other circulatory complications: Secondary | ICD-10-CM | POA: Diagnosis present

## 2015-12-19 LAB — CBC WITH DIFFERENTIAL/PLATELET
BASOS PCT: 0 %
Basophils Absolute: 0 10*3/uL (ref 0.0–0.1)
EOS PCT: 0 %
Eosinophils Absolute: 0 10*3/uL (ref 0.0–0.7)
HEMATOCRIT: 36.7 % — AB (ref 39.0–52.0)
HEMOGLOBIN: 13 g/dL (ref 13.0–17.0)
LYMPHS PCT: 25 %
Lymphs Abs: 1.5 10*3/uL (ref 0.7–4.0)
MCH: 28.3 pg (ref 26.0–34.0)
MCHC: 35.4 g/dL (ref 30.0–36.0)
MCV: 80 fL (ref 78.0–100.0)
Monocytes Absolute: 0.4 10*3/uL (ref 0.1–1.0)
Monocytes Relative: 6 %
Neutro Abs: 4.1 10*3/uL (ref 1.7–7.7)
Neutrophils Relative %: 69 %
Platelets: 234 10*3/uL (ref 150–400)
RBC: 4.59 MIL/uL (ref 4.22–5.81)
RDW: 13.3 % (ref 11.5–15.5)
WBC: 6 10*3/uL (ref 4.0–10.5)

## 2015-12-19 LAB — CK: CK TOTAL: 140 U/L (ref 49–397)

## 2015-12-19 LAB — COMPREHENSIVE METABOLIC PANEL
ALT: 18 U/L (ref 17–63)
ANION GAP: 12 (ref 5–15)
AST: 19 U/L (ref 15–41)
Albumin: 3.5 g/dL (ref 3.5–5.0)
Alkaline Phosphatase: 70 U/L (ref 38–126)
BILIRUBIN TOTAL: 0.4 mg/dL (ref 0.3–1.2)
BUN: 15 mg/dL (ref 6–20)
CALCIUM: 9.2 mg/dL (ref 8.9–10.3)
CO2: 20 mmol/L — ABNORMAL LOW (ref 22–32)
Chloride: 99 mmol/L — ABNORMAL LOW (ref 101–111)
Creatinine, Ser: 1.26 mg/dL — ABNORMAL HIGH (ref 0.61–1.24)
GFR calc Af Amer: >60 mL/min (ref >60)
GFR, EST NON AFRICAN AMERICAN: 59 mL/min — AB (ref >60)
Glucose, Bld: 245 mg/dL — ABNORMAL HIGH (ref 65–99)
POTASSIUM: 4.1 mmol/L (ref 3.5–5.1)
Sodium: 131 mmol/L — ABNORMAL LOW (ref 135–145)
TOTAL PROTEIN: 6.5 g/dL (ref 6.5–8.1)

## 2015-12-19 LAB — GLUCOSE, CAPILLARY
GLUCOSE-CAPILLARY: 258 mg/dL — AB (ref 65–99)
Glucose-Capillary: 183 mg/dL — ABNORMAL HIGH (ref 65–99)
Glucose-Capillary: 163 mg/dL — ABNORMAL HIGH (ref 65–99)

## 2015-12-19 LAB — MRSA PCR SCREENING: MRSA by PCR: NEGATIVE

## 2015-12-19 LAB — AMMONIA: AMMONIA: 30 umol/L (ref 9–35)

## 2015-12-19 LAB — TROPONIN I

## 2015-12-19 LAB — RAPID URINE DRUG SCREEN, HOSP PERFORMED
AMPHETAMINES: NOT DETECTED
BARBITURATES: NOT DETECTED
BENZODIAZEPINES: POSITIVE — AB
Cocaine: NOT DETECTED
Opiates: NOT DETECTED
Tetrahydrocannabinol: NOT DETECTED

## 2015-12-19 LAB — LIPASE, BLOOD: Lipase: 26 U/L (ref 11–51)

## 2015-12-19 LAB — LACTIC ACID, PLASMA: Lactic Acid, Venous: 1.8 mmol/L (ref 0.5–2.0)

## 2015-12-19 MED ORDER — INSULIN GLARGINE 100 UNIT/ML ~~LOC~~ SOLN
25.0000 [IU] | Freq: Every day | SUBCUTANEOUS | Status: DC
  Administered 2015-12-19 – 2015-12-20 (×3): 25 [IU] via SUBCUTANEOUS
  Filled 2015-12-19 (×4): qty 0.25

## 2015-12-19 MED ORDER — OXYCODONE-ACETAMINOPHEN 5-325 MG PO TABS
1.0000 | ORAL_TABLET | ORAL | Status: DC | PRN
  Administered 2015-12-19 (×2): 1 via ORAL
  Filled 2015-12-19 (×2): qty 1

## 2015-12-19 MED ORDER — ONDANSETRON HCL 4 MG/2ML IJ SOLN: 4.0000 mg | Freq: Four times a day (QID) | INTRAMUSCULAR | Status: DC | PRN

## 2015-12-19 MED ORDER — CARVEDILOL 25 MG PO TABS
25.0000 mg | ORAL_TABLET | Freq: Two times a day (BID) | ORAL | Status: DC
  Administered 2015-12-19 – 2015-12-21 (×5): 25 mg via ORAL
  Filled 2015-12-19 (×5): qty 1

## 2015-12-19 MED ORDER — ACETAMINOPHEN 650 MG RE SUPP: 650.0000 mg | Freq: Four times a day (QID) | RECTAL | Status: DC | PRN

## 2015-12-19 MED ORDER — ONDANSETRON HCL 4 MG PO TABS: 4.0000 mg | ORAL_TABLET | Freq: Four times a day (QID) | ORAL | Status: DC | PRN

## 2015-12-19 MED ORDER — INSULIN GLARGINE 100 UNIT/ML ~~LOC~~ SOLN
20.0000 [IU] | Freq: Every day | SUBCUTANEOUS | Status: DC
  Administered 2015-12-21: 20 [IU] via SUBCUTANEOUS
  Filled 2015-12-19 (×3): qty 0.2

## 2015-12-19 MED ORDER — INSULIN ASPART 100 UNIT/ML ~~LOC~~ SOLN
0.0000 [IU] | Freq: Three times a day (TID) | SUBCUTANEOUS | Status: DC
  Administered 2015-12-19 (×2): 2 [IU] via SUBCUTANEOUS
  Administered 2015-12-20 – 2015-12-21 (×3): 3 [IU] via SUBCUTANEOUS
  Administered 2015-12-21: 1 [IU] via SUBCUTANEOUS

## 2015-12-19 MED ORDER — HYDROMORPHONE HCL 1 MG/ML IJ SOLN: 1.0000 mg | INTRAMUSCULAR | Status: DC | PRN

## 2015-12-19 MED ORDER — SODIUM CHLORIDE 0.9 % IV SOLN
INTRAVENOUS | Status: AC
  Administered 2015-12-19 – 2015-12-20 (×2): via INTRAVENOUS

## 2015-12-19 MED ORDER — ACETAMINOPHEN 325 MG PO TABS
650.0000 mg | ORAL_TABLET | Freq: Four times a day (QID) | ORAL | Status: DC | PRN
  Administered 2015-12-20: 650 mg via ORAL
  Filled 2015-12-19 (×2): qty 2

## 2015-12-19 MED ORDER — ENOXAPARIN SODIUM 40 MG/0.4ML ~~LOC~~ SOLN
40.0000 mg | Freq: Every day | SUBCUTANEOUS | Status: DC
  Administered 2015-12-19 – 2015-12-21 (×3): 40 mg via SUBCUTANEOUS
  Filled 2015-12-19 (×3): qty 0.4

## 2015-12-19 MED ORDER — ASPIRIN EC 81 MG PO TBEC
81.0000 mg | DELAYED_RELEASE_TABLET | Freq: Every day | ORAL | Status: DC
Start: 2015-12-19 — End: 2015-12-21
  Administered 2015-12-19 – 2015-12-21 (×3): 81 mg via ORAL
  Filled 2015-12-19 (×3): qty 1

## 2015-12-19 MED ORDER — ATORVASTATIN CALCIUM 80 MG PO TABS
80.0000 mg | ORAL_TABLET | Freq: Every day | ORAL | Status: DC
  Administered 2015-12-19 – 2015-12-20 (×2): 80 mg via ORAL
  Filled 2015-12-19: qty 1
  Filled 2015-12-19: qty 2

## 2015-12-19 MED ORDER — HYDRALAZINE HCL 25 MG PO TABS
25.0000 mg | ORAL_TABLET | Freq: Every day | ORAL | Status: DC
  Administered 2015-12-19 – 2015-12-21 (×3): 25 mg via ORAL
  Filled 2015-12-19 (×3): qty 1

## 2015-12-19 MED ORDER — INSULIN GLARGINE 100 UNIT/ML ~~LOC~~ SOLN: 20.0000 [IU] | Freq: Two times a day (BID) | SUBCUTANEOUS | Status: DC

## 2015-12-19 MED ORDER — ISOSORBIDE MONONITRATE ER 30 MG PO TB24
30.0000 mg | ORAL_TABLET | Freq: Every day | ORAL | Status: DC
  Administered 2015-12-19 – 2015-12-21 (×3): 30 mg via ORAL
  Filled 2015-12-19 (×3): qty 1

## 2015-12-19 MED ORDER — IVABRADINE HCL 5 MG PO TABS
5.0000 mg | ORAL_TABLET | Freq: Two times a day (BID) | ORAL | Status: DC
  Administered 2015-12-19 – 2015-12-21 (×4): 5 mg via ORAL
  Filled 2015-12-19 (×6): qty 1

## 2015-12-19 MED ORDER — NITROGLYCERIN 0.4 MG SL SUBL: 0.4000 mg | SUBLINGUAL_TABLET | SUBLINGUAL | Status: DC | PRN
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
## 2015-12-19 NOTE — Care Management Note (Signed)
Case Management Note  Patient Details  Name: Xayvion Berquist MRN: [id number] Date of Birth: Sep 12, 1950  Subjective/Objective: 65 y.o. M admitted 12/18/2015 with CP. Pt currently on CIWA protocol.  Pt tells me that he lives with wife and daughter and that he has a PCP. Reports he can afford his medications and has transportation to MD appointments.   Action/Plan:  No CM needs at present time but will be available should disposition/discharge needs arise.    Expected Discharge Date:                  Expected Discharge Plan:     In-House Referral:     Discharge planning Services  CM Consult  Post Acute Care Choice:    Choice offered to:     DME Arranged:    DME Agency:     HH Arranged:    HH Agency:     Status of Service:  In process, will continue to follow  Medicare Important Message Given:    Date Medicare IM Given:    Medicare IM give by:    Date Additional Medicare IM Given:    Additional Medicare Important Message give by:     If discussed at [place] of Stay Meetings, dates discussed:    Additional Comments:  Delrae Sawyers, RN 12/19/2015, 2:55 PM

## 2015-12-19 NOTE — Consult Note (Signed)
[facility] Face-to-Face Psychiatry Consult   Reason for Consult:  Anxiety and alcohol intoxication. Referring Physician:  Dr. Rai Patient Identification: Antonio Guerra MRN:  [id number] Principal Diagnosis: Chest pain Diagnosis:   Patient Active Problem List   Diagnosis Date Noted  . 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 Alcohol intoxication (HCC) [F10.129] 12/19/2015  . Epigastric pain [R10.13]   . Non compliance w medication regimen [Z91.14]   . Elevated troponin [R79.89]   . Type 2 diabetes mellitus (HCC) [E11.9] 09/29/2015  . Hypertensive urgency [I16.0] 09/27/2015  . Acute on chronic combined systolic (congestive) and diastolic (congestive) heart failure (HCC) [I50.43] 09/27/2015  . Ischemic cardiomyopathy [I25.5]   . Hyperlipidemia [E78.5]   . Coronary artery disease [I25.10]   . Chronic systolic congestive heart failure (HCC) [I50.22]   . Unstable angina (HCC) [I20.0] 05/11/2014  . Stroke (HCC) [I63.9]   . Hypertension [I10]   . Diabetes mellitus without complication (HCC) [E11.9]   . Chest pain [R07.9] 05/09/2014    Total Time spent with patient: 1 hour  Subjective:   Antonio Guerra is a 64 y.o. male patient admitted with Depression, anxiety and alcohol intoxication.  HPI:  Antonio Guerra is a 64 y.o. male, seen, chart reviewed for psychiatric consultation and evaluation. Patient appeared lying on his bed with increased symptoms of depression, anxiety and multiple psychosocial stresses. Patient stated has been generally week, fatigue and tired, isolated, withdrawn and feels nobody care for him and has been self-medicating by drinking alcohol for the last 2 weeks. Reportedly patient daughter brought him to the hospital when he was unsteady on his feet and falling and hurting himself with multiple falls. Patient stated that his daughter has been suffering with chronic renal failure and needed dialysis and his wife was 65 years old drinks more alcohol  than him. Patient is also concerned about multiple family members including grandchildren smoking and drinking. Patient denies active suicidal/homicidal ideation, intention or plans. Patient has no evidence of auditory or/visual hallucinations, delusions and paranoia. Patient has intact cognitions and elevated off his multiple medical problems and treatment needs. Patient blood alcohol level is 199 on arrival. Patient is willing to start medication management for depression and anxiety and insomnia.  Past Psychiatric History: Patient has no previous history of acute psychiatric hospitalization and does not have outpatient psychiatric medication management.  Risk to Self: Is patient at risk for suicide?: No Risk to Others:   Prior Inpatient Therapy:   Prior Outpatient Therapy:    Past Medical History:  Past Medical History  Diagnosis Date  . Coronary artery disease     a. 2011 MI x 2 with PCI: stent x 2 (LAD and RI) @ [facility] in [place], [place];  b. 04/2014 Cath/PCI: LM 10-20, LAD 40p, 60m, 80m ISR(3.5x38 Xience DES), 50apical, LCX 20 diffuse, RI 50p, 20m ISR, RCA 40-50m, 30d.  . Diabetes mellitus without complication (HCC)   . Hypertension   . Stroke (HCC)     a. 2012  . Hyperlipidemia   . Ischemic cardiomyopathy     a. 04/2014 Echo: EF 20-25%, anteroseptal, inferior, & basal-mid inferoseptal AK. Sev apical/ant HK, Ao sclerosis, mild MR, triv PI.  . Chronic systolic congestive heart failure (HCC)     a. 04/2014 Echo: EF 20-25%.  . Hypertensive urgency 09/27/2015  . Acute on chronic combined systolic (congestive) and diastolic (congestive) heart failure (HCC) 09/27/2015    Past Surgical History  Procedure Laterality Date  . Ankle surgery    .   Coronary stent placement    . Left heart catheterization with coronary/graft angiogram N/A 05/11/2014    Procedure: LEFT HEART CATHETERIZATION WITH Beatrix Fetters;  Surgeon: Burnell Blanks, MD;  Location: [facility] CATH LAB;  Service:  Cardiovascular;  Laterality: N/A;  . Percutaneous coronary stent intervention (pci-s)  05/11/2014    Procedure: PERCUTANEOUS CORONARY STENT INTERVENTION (PCI-S);  Surgeon: Burnell Blanks, MD;  Location: [facility] CATH LAB;  Service: Cardiovascular;;  . Fractional flow reserve wire  05/11/2014    Procedure: FRACTIONAL FLOW RESERVE WIRE;  Surgeon: Burnell Blanks, MD;  Location: [facility] CATH LAB;  Service: Cardiovascular;;  . Cardiac catheterization N/A 09/30/2015    Procedure: Left Heart Cath and Coronary Angiography;  Surgeon: Burnell Blanks, MD;  Location: [facility] CV LAB;  Service: Cardiovascular;  Laterality: N/A;   Family History:  Family History  Problem Relation Age of Onset  . Diabetes Mother   . CAD Neg Hx    Family Psychiatric  History: Patient has family history of substance abuse Social History:  History  Alcohol Use No     History  Drug Use No    Social History   Social History  . Marital Status: Single    Spouse Name: N/A  . Number of Children: N/A  . Years of Education: N/A   Social History Main Topics  . Smoking status: Never Smoker   . Smokeless tobacco: Never Used  . Alcohol Use: No  . Drug Use: No  . Sexual Activity: No   Other Topics Concern  . None   Social History Narrative   Additional Social History:    Allergies:   Allergies  Allergen Reactions  . Tape Itching and Rash    Labs:  Results for orders placed or performed during the hospital encounter of 12/18/15 (from the past 48 hour(s))  Basic metabolic panel     Status: Abnormal   Collection Time: 12/18/15  7:56 PM  Result Value Ref Range   Sodium 129 (L) 135 - 145 mmol/L   Potassium 3.6 3.5 - 5.1 mmol/L   Chloride 98 (L) 101 - 111 mmol/L   CO2 18 (L) 22 - 32 mmol/L   Glucose, Bld 291 (H) 65 - 99 mg/dL   BUN 15 6 - 20 mg/dL   Creatinine, Ser 1.27 (H) 0.61 - 1.24 mg/dL   Calcium 9.1 8.9 - 10.3 mg/dL   GFR calc non Af Amer 58 (L) >60 mL/min   GFR calc Af Amer >60 >60  mL/min    Comment: (NOTE) The eGFR has been calculated using the CKD EPI equation. This calculation has not been validated in all clinical situations. eGFR's persistently <60 mL/min signify possible Chronic Kidney Disease.    Anion gap 13 5 - 15  CBC     Status: Abnormal   Collection Time: 12/18/15  7:56 PM  Result Value Ref Range   WBC 5.6 4.0 - 10.5 K/uL   RBC 4.79 4.22 - 5.81 MIL/uL   Hemoglobin 13.6 13.0 - 17.0 g/dL   HCT 38.4 (L) 39.0 - 52.0 %   MCV 80.2 78.0 - 100.0 fL   MCH 28.4 26.0 - 34.0 pg   MCHC 35.4 30.0 - 36.0 g/dL   RDW 13.2 11.5 - 15.5 %   Platelets 247 150 - 400 K/uL  Brain natriuretic peptide     Status: Abnormal   Collection Time: 12/18/15  7:56 PM  Result Value Ref Range   B Natriuretic Peptide 169.8 (H) 0.0 - 100.0  pg/mL  Ethanol     Status: Abnormal   Collection Time: 12/18/15  7:56 PM  Result Value Ref Range   Alcohol, Ethyl (B) 167 (H) <5 mg/dL    Comment:        LOWEST DETECTABLE LIMIT FOR SERUM ALCOHOL IS 5 mg/dL FOR MEDICAL PURPOSES ONLY   Differential     Status: None   Collection Time: 12/18/15  7:56 PM  Result Value Ref Range   Neutrophils Relative % 56 %   Neutro Abs 3.1 1.7 - 7.7 K/uL   Lymphocytes Relative 37 %   Lymphs Abs 2.1 0.7 - 4.0 K/uL   Monocytes Relative 7 %   Monocytes Absolute 0.4 0.1 - 1.0 K/uL   Eosinophils Relative 0 %   Eosinophils Absolute 0.0 0.0 - 0.7 K/uL   Basophils Relative 0 %   Basophils Absolute 0.0 0.0 - 0.1 K/uL  I-stat troponin, ED     Status: None   Collection Time: 12/18/15  8:07 PM  Result Value Ref Range   Troponin i, poc 0.02 0.00 - 0.08 ng/mL   Comment 3            Comment: Due to the release kinetics of cTnI, a negative result within the first hours of the onset of symptoms does not rule out myocardial infarction with certainty. If myocardial infarction is still suspected, repeat the test at appropriate intervals.   MRSA PCR Screening     Status: None   Collection Time: 12/18/15 11:26 PM   Result Value Ref Range   MRSA by PCR NEGATIVE NEGATIVE    Comment:        The GeneXpert MRSA Assay (FDA approved for NASAL specimens only), is one component of a comprehensive MRSA colonization surveillance program. It is not intended to diagnose MRSA infection nor to guide or monitor treatment for MRSA infections.   Troponin I (q 6hr x 3)     Status: None   Collection Time: 12/19/15  1:15 AM  Result Value Ref Range   Troponin I <0.03 <0.031 ng/mL    Comment:        NO INDICATION OF MYOCARDIAL INJURY.   Comprehensive metabolic panel     Status: Abnormal   Collection Time: 12/19/15  1:15 AM  Result Value Ref Range   Sodium 131 (L) 135 - 145 mmol/L   Potassium 4.1 3.5 - 5.1 mmol/L   Chloride 99 (L) 101 - 111 mmol/L   CO2 20 (L) 22 - 32 mmol/L   Glucose, Bld 245 (H) 65 - 99 mg/dL   BUN 15 6 - 20 mg/dL   Creatinine, Ser 1.26 (H) 0.61 - 1.24 mg/dL   Calcium 9.2 8.9 - 10.3 mg/dL   Total Protein 6.5 6.5 - 8.1 g/dL   Albumin 3.5 3.5 - 5.0 g/dL   AST 19 15 - 41 U/L   ALT 18 17 - 63 U/L   Alkaline Phosphatase 70 38 - 126 U/L   Total Bilirubin 0.4 0.3 - 1.2 mg/dL   GFR calc non Af Amer 59 (L) >60 mL/min   GFR calc Af Amer >60 >60 mL/min    Comment: (NOTE) The eGFR has been calculated using the CKD EPI equation. This calculation has not been validated in all clinical situations. eGFR's persistently <60 mL/min signify possible Chronic Kidney Disease.    Anion gap 12 5 - 15  CBC WITH DIFFERENTIAL     Status: Abnormal   Collection Time: 12/19/15  1:15 AM  Result Value   Ref Range   WBC 6.0 4.0 - 10.5 K/uL   RBC 4.59 4.22 - 5.81 MIL/uL   Hemoglobin 13.0 13.0 - 17.0 g/dL   HCT 36.7 (L) 39.0 - 52.0 %   MCV 80.0 78.0 - 100.0 fL   MCH 28.3 26.0 - 34.0 pg   MCHC 35.4 30.0 - 36.0 g/dL   RDW 13.3 11.5 - 15.5 %   Platelets 234 150 - 400 K/uL   Neutrophils Relative % 69 %   Neutro Abs 4.1 1.7 - 7.7 K/uL   Lymphocytes Relative 25 %   Lymphs Abs 1.5 0.7 - 4.0 K/uL   Monocytes  Relative 6 %   Monocytes Absolute 0.4 0.1 - 1.0 K/uL   Eosinophils Relative 0 %   Eosinophils Absolute 0.0 0.0 - 0.7 K/uL   Basophils Relative 0 %   Basophils Absolute 0.0 0.0 - 0.1 K/uL  Troponin I (q 6hr x 3)     Status: None   Collection Time: 12/19/15  6:26 AM  Result Value Ref Range   Troponin I <0.03 <0.031 ng/mL    Comment:        NO INDICATION OF MYOCARDIAL INJURY.   Lipase, blood     Status: None   Collection Time: 12/19/15  7:37 AM  Result Value Ref Range   Lipase 26 11 - 51 U/L  Lactic acid, plasma     Status: None   Collection Time: 12/19/15  7:37 AM  Result Value Ref Range   Lactic Acid, Venous 1.8 0.5 - 2.0 mmol/L  Ammonia     Status: None   Collection Time: 12/19/15  7:37 AM  Result Value Ref Range   Ammonia 30 9 - 35 umol/L  CK     Status: None   Collection Time: 12/19/15  7:37 AM  Result Value Ref Range   Total CK 140 49 - 397 U/L  Urine rapid drug screen (hosp performed)     Status: Abnormal   Collection Time: 12/19/15 10:50 AM  Result Value Ref Range   Opiates NONE DETECTED NONE DETECTED   Cocaine NONE DETECTED NONE DETECTED   Benzodiazepines POSITIVE (A) NONE DETECTED   Amphetamines NONE DETECTED NONE DETECTED   Tetrahydrocannabinol NONE DETECTED NONE DETECTED   Barbiturates NONE DETECTED NONE DETECTED    Comment:        DRUG SCREEN FOR MEDICAL PURPOSES ONLY.  IF CONFIRMATION IS NEEDED FOR ANY PURPOSE, NOTIFY LAB WITHIN 5 DAYS.        LOWEST DETECTABLE LIMITS FOR URINE DRUG SCREEN Drug Class       Cutoff (ng/mL) Amphetamine      1000 Barbiturate      200 Benzodiazepine   200 Tricyclics       300 Opiates          300 Cocaine          300 THC              50   Glucose, capillary     Status: Abnormal   Collection Time: 12/19/15 11:55 AM  Result Value Ref Range   Glucose-Capillary 183 (H) 65 - 99 mg/dL  Troponin I (q 6hr x 3)     Status: None   Collection Time: 12/19/15 12:42 PM  Result Value Ref Range   Troponin I <0.03 <0.031 ng/mL     Comment:        NO INDICATION OF MYOCARDIAL INJURY.     Current Facility-Administered Medications  Medication Dose Route Frequency   Provider Last Rate Last Dose  . 0.9 %  sodium chloride infusion   Intravenous Continuous Rise Patience, MD 50 mL/hr at 12/19/15 0149    . acetaminophen (TYLENOL) tablet 650 mg  650 mg Oral Q6H PRN Rise Patience, MD       Or  . acetaminophen (TYLENOL) suppository 650 mg  650 mg Rectal Q6H PRN Rise Patience, MD      . aspirin chewable tablet 324 mg  324 mg Oral Once Forde Dandy, MD   324 mg at 12/18/15 2007  . aspirin EC tablet 81 mg  81 mg Oral Daily Rise Patience, MD   81 mg at 12/19/15 1038  . atorvastatin (LIPITOR) tablet 80 mg  80 mg Oral q1800 Rise Patience, MD      . carvedilol (COREG) tablet 25 mg  25 mg Oral BID WC Rise Patience, MD   25 mg at 12/19/15 1038  . enoxaparin (LOVENOX) injection 40 mg  40 mg Subcutaneous Daily Rise Patience, MD   40 mg at 12/19/15 1038  . folic acid (FOLVITE) tablet 1 mg  1 mg Oral Daily Rise Patience, MD   1 mg at 12/19/15 1037  . hydrALAZINE (APRESOLINE) tablet 25 mg  25 mg Oral Daily Rise Patience, MD   25 mg at 12/19/15 1037  . HYDROmorphone (DILAUDID) injection 1 mg  1 mg Intravenous Q4H PRN Ripudeep K Rai, MD      . insulin aspart (novoLOG) injection 0-9 Units  0-9 Units Subcutaneous TID WC Rise Patience, MD   2 Units at 12/19/15 1233  . insulin glargine (LANTUS) injection 20 Units  20 Units Subcutaneous Daily Rise Patience, MD   20 Units at 12/19/15 1000   And  . insulin glargine (LANTUS) injection 25 Units  25 Units Subcutaneous QHS Rise Patience, MD   25 Units at 12/19/15 0150  . isosorbide mononitrate (IMDUR) 24 hr tablet 30 mg  30 mg Oral Daily Rise Patience, MD   30 mg at 12/19/15 1037  . ivabradine (CORLANOR) tablet 5 mg  5 mg Oral BID WC Rise Patience, MD   5 mg at 12/19/15 0800  . LORazepam (ATIVAN) injection 0-4 mg  0-4 mg  Intravenous Q6H Rise Patience, MD   2 mg at 12/19/15 1240   Followed by  . [START ON 12/21/2015] LORazepam (ATIVAN) injection 0-4 mg  0-4 mg Intravenous Q12H Rise Patience, MD      . LORazepam (ATIVAN) tablet 1 mg  1 mg Oral Q6H PRN Rise Patience, MD       Or  . LORazepam (ATIVAN) injection 1 mg  1 mg Intravenous Q6H PRN Rise Patience, MD      . multivitamin with minerals tablet 1 tablet  1 tablet Oral Daily Rise Patience, MD   1 tablet at 12/19/15 1037  . nitroGLYCERIN (NITROSTAT) SL tablet 0.4 mg  0.4 mg Sublingual Q5 min PRN Forde Dandy, MD   0.4 mg at 12/18/15 2102  . nitroGLYCERIN (NITROSTAT) SL tablet 0.4 mg  0.4 mg Sublingual Q5 min PRN Rise Patience, MD      . ondansetron [facility]) tablet 4 mg  4 mg Oral Q6H PRN Rise Patience, MD       Or  . ondansetron (ZOFRAN) injection 4 mg  4 mg Intravenous Q6H PRN Rise Patience, MD      . oxyCODONE-acetaminophen (PERCOCET/ROXICET) 5-325 MG  per tablet 1-2 tablet  1-2 tablet Oral Q4H PRN Ripudeep K Rai, MD   1 tablet at 12/19/15 1035  . thiamine (VITAMIN B-1) tablet 100 mg  100 mg Oral Daily Arshad N Kakrakandy, MD   100 mg at 12/19/15 1037    Musculoskeletal: Strength & Muscle Tone: decreased Gait & Station: unable to stand Patient leans: N/A  Psychiatric Specialty Exam: Physical Exam as per history and physical    ROS generalized weakness, tired, fatigued, depressed, anxious, no energy and worried about family members and feels nobody care for him. No Fever-chills, No Headache, No changes with Vision or hearing, reports vertigo No problems swallowing food or Liquids, No Chest pain, Cough or Shortness of Breath, No Abdominal pain, No Nausea or Vommitting, Bowel movements are regular, No Blood in stool or Urine, No dysuria, No new skin rashes or bruises, No new joints pains-aches,  No new weakness, tingling, numbness in any extremity, No recent weight gain or loss, No polyuria, polydypsia or  polyphagia,   A full 10 point Review of Systems was done, except as stated above, all other Review of Systems were negative.  Blood pressure 104/67, pulse 93, temperature 97.8 F (36.6 C), temperature source Oral, resp. rate 29, height 6' 1" (1.854 m), weight 86.909 kg (191 lb 9.6 oz), SpO2 100 %.Body mass index is 25.28 kg/(m^2).  General Appearance: Casual  Eye Contact:  Good  Speech:  Slow and Slurred  Volume:  Decreased  Mood:  Anxious and Depressed  Affect:  Congruent and Depressed  Thought Process:  Coherent and Goal Directed  Orientation:  Full (Time, Place, and Person)  Thought Content:  WDL  Suicidal Thoughts:  No  Homicidal Thoughts:  No  Memory:  Immediate;   Good Recent;   Fair  Judgement:  Impaired  Insight:  Fair  Psychomotor Activity:  Decreased  Concentration:  Concentration: Fair  Recall:  Fair  Fund of Knowledge:  Good  Language:  Good  Akathisia:  Negative  Handed:  Right  AIMS (if indicated):     Assets:  Communication Skills Desire for Improvement Financial Resources/Insurance Housing Leisure Time Resilience Social Support Talents/Skills Transportation  ADL's:  Impaired  Cognition:  Impaired,  Mild  Sleep:        Treatment Plan Summary: Patient presented with acute symptoms of depression, anxiety and alcohol intoxication and is a poor historian Daily contact with patient to assess and evaluate symptoms and progress in treatment and Medication management   Patient has no safety concerns  Continue Ativan detox treatment for alcohol and CIWA protocol We start Cymbalta 30 mg daily for depression and Remeron 7.5 mg at bedtime for insomnia and poor appetite Appreciate psychiatric consultation and follow up as clinically required Please contact [phone] or [phone] if needs further assistance  Disposition: Patient does not meet criteria for psychiatric inpatient admission. Supportive therapy provided about ongoing stressors.  JANARDHANA  JONNALAGADDA, MD 12/19/2015 3:02 PM  

## 2015-12-19 NOTE — Progress Notes (Signed)
[facility]                                                                              Patient Demographics  Acencion Toni, is a 65 y.o. male, DOB - 1951/03/08, [id number]  Admit date - 12/18/2015   Admitting Physician Rise Patience, MD  Outpatient Primary MD for the patient is OSEI-BONSU,GEORGE, MD  Outpatient specialists:   LOS -   days    Chief Complaint  Patient presents with  . Chest Pain       Brief summary   Tenzing Lindor is a 65 y.o. male with medical history significant of CAD status post stenting last cardiac cath in March 2017, ischemic cardiomyopathy not EF measured was 15-20% March 2017, alcohol abuse, hypertension diabetes mellitus, was brought to the ER due to  chest pain. As per the family patient was drinking alcohol last evening when suddenly started complaining of chest pain. Patient also fell off the chair, but did not lose consciousness. In the ER patient's EKG cardiac markers were negative. History was unreliable from the patient. He also complained of epigastric pain ED Course: EKG and chest x-ray were unremarkable. Cardiac markers were negative. Metabolic panel shows metabolic acidosis probably from alcoholism.   Assessment & Plan    Principal Problem:   Chest pain/Epigastric pain:Possibly due to mild acute on chronic pancreatitis, GERD/gastritis - Patient has a history of CAD  , PCI in 2011 and 2015, EF 20-25%, cardiology consulted. Per cardiology, troponin negative 2. EKG showed some inferior lateral ischemic changes consistent with old EKGs. No need for ischemic evaluation at this time, recent cath 3 months ago.  - Continue medical therapy  - CT abdomen and pelvis showed pancreatic aspirations likely sequelae of chronic pancreatitis caution about minimal peripancreatic inflammation may reflect mild acute on chronic pancreatitis, reflux  - Started with clear liquid diet, advance as tolerated, lipase 26  - Place on  PPI   Active Problems:   Hypertension - Currently stable, continue Coreg, hydralazine, imdur    Ischemic cardiomyopathy/ CAD (last EF 20-25%) - MI in 2011, PCI 2 LAD in [place], [place], cath 10/15 showed severe in-stent restenosis in mid LAD s/p DES. Recent cath in 3/17 showed stable CAD, and patent stents.  - No further ischemic evaluation per cardiology - Continue aspirin, statin, Coreg, imdur    Chronic systolic congestive heart failure (HCC) - Last EF was 15-20% with akinesis of the basal -midanteroseptal myocardium. - continue isosorbide, ivabradine, cardiology following    Type 2 diabetes mellitus with vascular disease ([place]) - Continue    Alcohol intoxication ([place]) - Placed on CIWA protocol and Ativan. - Patient reports that he has high stress and was recently incarcerated, no suicidal or homicidal ideation. Requesting psychiatry consult, called   Code Status: FC  DVT Prophylaxis:  Lovenox    Family Communication: Discussed in detail with the patient, all imaging results, lab results explained to the patient    Disposition Plan: Possibly dc in am  Time Spent in minutes   25 minutes  Procedures:  CT abd  Consultants:   Cardiology Psych  Antimicrobials:   None  Medications  Scheduled Meds: . aspirin  324 mg Oral Once  . aspirin EC  81 mg Oral Daily  . atorvastatin  80 mg Oral q1800  . carvedilol  25 mg Oral BID WC  . enoxaparin (LOVENOX) injection  40 mg Subcutaneous Daily  . folic acid  1 mg Oral Daily  . hydrALAZINE  25 mg Oral Daily  . insulin aspart  0-9 Units Subcutaneous TID WC  . insulin glargine  20 Units Subcutaneous Daily   And  . insulin glargine  25 Units Subcutaneous QHS  . isosorbide mononitrate  30 mg Oral Daily  . ivabradine  5 mg Oral BID WC  . LORazepam  0-4 mg Intravenous Q6H   Followed by  . [START ON 12/21/2015] LORazepam  0-4 mg Intravenous Q12H  . multivitamin with minerals  1 tablet Oral Daily  . thiamine  100 mg Oral  Daily   Or  . thiamine  100 mg Intravenous Daily   Continuous Infusions: . sodium chloride 50 mL/hr at 12/19/15 0149   PRN Meds:.acetaminophen **OR** acetaminophen, HYDROmorphone (DILAUDID) injection, LORazepam **OR** LORazepam, nitroGLYCERIN, nitroGLYCERIN, ondansetron (ZOFRAN) IV, ondansetron **OR** ondansetron (ZOFRAN) IV, oxyCODONE-acetaminophen   Antibiotics   Anti-infectives    None        Subjective:   Geof Pimenta was seen and examined today.  Complaining of mild epigastric pain, no chest pain at this time. No shortness of breath. No nausea or vomiting. Patient denies dizziness, abdominal pain, N/V/D/C, new weakness, numbess, tingling. No acute events overnight.    Objective:   Filed Vitals:   12/19/15 0000 12/19/15 0434 12/19/15 0755 12/19/15 1000  BP: 152/93 146/93  167/106  Pulse: 102 103  99  Temp:  98.3 F (36.8 C) 98.6 F (37 C)   TempSrc:  Oral Oral   Resp: 22 13  29   Height:      Weight:  86.909 kg (191 lb 9.6 oz)    SpO2: 100% 97%  100%   No intake or output data in the 24 hours ending 12/19/15 1129   Wt Readings from Last 3 Encounters:  12/19/15 86.909 kg (191 lb 9.6 oz)  11/25/15 88.633 kg (195 lb 6.4 oz)  10/02/15 87.363 kg (192 lb 9.6 oz)     Exam  General: Alert and oriented x 3, NAD  HEENT:  PERRLA, EOMI, Anicteric Sclera, mucous membranes moist.   Neck: Supple, no JVD, no masses  Cardiovascular: S1 S2 auscultated, no rubs, murmurs or gallops. Regular rate and rhythm.  Respiratory: Clear to auscultation bilaterally, no wheezing, rales or rhonchi, no chest wall tenderness  Gastrointestinal: Soft, non tender on deep palpation, nondistended, + bowel sounds  Ext: no cyanosis clubbing or edema  Neuro: AAOx3, Cr N's II- XII. Strength 5/5 upper and lower extremities bilaterally  Skin: No rashes  Psych: Normal affect and demeanor, alert and oriented x3    Data Reviewed:  I have personally reviewed following labs and imaging  studies  Micro Results Recent Results (from the past 240 hour(s))  MRSA PCR Screening     Status: None   Collection Time: 12/18/15 11:26 PM  Result Value Ref Range Status   MRSA by PCR NEGATIVE NEGATIVE Final    Comment:        The GeneXpert MRSA Assay (FDA approved for NASAL specimens only), is one component of a comprehensive MRSA colonization surveillance program. It is not intended to diagnose MRSA infection nor to guide or monitor treatment for MRSA infections.  Radiology Reports Ct Abdomen Pelvis Wo Contrast  12/19/2015  CLINICAL DATA:  Epigastric pain. EXAM: CT ABDOMEN AND PELVIS WITHOUT CONTRAST TECHNIQUE: Multidetector CT imaging of the abdomen and pelvis was performed following the standard protocol without IV contrast. COMPARISON:  None. FINDINGS: Lower chest: The included lung bases are clear. Coronary artery calcifications are seen. The heart is enlarged. There is thickening of the distal esophagus. Liver: Limited assessment for focal lesion due to lack contrast. Borderline steatosis. Hepatobiliary: Gallbladder physiologically distended, no calcified stone. No biliary dilatation. Pancreas: Scatter pancreatic calcifications, sequela of chronic pancreatitis. Question of minimal peripancreatic edema versus motion artifact about the body. Spleen: Normal in size. Adrenal glands: No nodule. Kidneys: No hydronephrosis. No nephrolithiasis. Bilateral nonspecific perinephric edema. Question and scarring in the lower left kidney. Hypodense lesion in the lower left kidney incompletely characterized without contrast. Stomach/Bowel: Small hiatal hernia and distal esophageal wall thickening. Stomach physiologically distended with ingested contents. Small-moderate volume of stool throughout the colon without colonic wall thickening. Mild diverticulosis of the distal colon without diverticulitis. The appendix is normal. Vascular/Lymphatic: No retroperitoneal adenopathy. Abdominal aorta is normal  in caliber. Atherosclerosis without aneurysm Reproductive: Prominent sized prostate gland measuring 5.5 cm transverse. Bladder: Distended.  No bladder wall thickening.  No bladder stone. Other: No free air, free fluid, or intra-abdominal fluid collection. There is fat within the right inguinal canal. Musculoskeletal: There are no acute or suspicious osseous abnormalities. Transitional lumbosacral anatomy. IMPRESSION: 1. Pancreatic calcifications, likely sequela of chronic pancreatitis. There is questionable minimal peripancreatic inflammation versus artifact. This may reflect mild acute on chronic pancreatitis. Recommend correlation with graphic enzymes. 2. Small hiatal hernia with distal esophageal wall thickening, can be seen with reflux. 3. Atherosclerosis including coronary artery calcifications. Electronically Signed   By: Jeb Levering M.D.   On: 12/19/2015 04:20   Dg Chest 2 View  12/18/2015  CLINICAL DATA:  Shortness of breath. Left-sided intermittent chest pain. EXAM: CHEST  2 VIEW COMPARISON:  September 27, 2015 FINDINGS: The heart, hila, and mediastinum are unchanged. There is a mildly tortuous thoracic aorta which is stable. No pulmonary nodules or masses. No focal infiltrates. IMPRESSION: No acute abnormalities or changes identified. Electronically Signed   By: Dorise Bullion III M.D   On: 12/18/2015 20:41   Ct Head Wo Contrast  12/19/2015  CLINICAL DATA:  65 year old male with history of chest pain. Prior history of stroke with right-sided deficit. EXAM: CT HEAD WITHOUT CONTRAST TECHNIQUE: Contiguous axial images were obtained from the base of the skull through the vertex without intravenous contrast. COMPARISON:  No priors. FINDINGS: Low-attenuation in left posterior frontal cortex and subcortical white matter, compatible with areas of encephalomalacia/gliosis from prior left MCA territory infarction. Low-attenuation also noted in the superior aspect of the left cerebellar hemisphere, compatible  with an old infarct. Patchy and confluent areas of decreased attenuation are noted throughout the deep and periventricular white matter of the cerebral hemispheres bilaterally, compatible with chronic microvascular ischemic disease. Faint physiologic calcifications are noted in the left basal ganglia. No acute intracranial abnormalities. Specifically, no evidence of acute intracranial hemorrhage, no definite findings of acute/subacute cerebral ischemia, no mass, mass effect, hydrocephalus or abnormal intra or extra-axial fluid collections. Visualized paranasal sinuses and mastoids are well pneumatized. No acute displaced skull fractures are identified. IMPRESSION: 1. 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 No acute intracranial abnormalities. Electronically Signed   By: Vinnie Langton  M.D.   On: 12/19/2015 07:10   Dg Pelvis Portable  12/19/2015  CLINICAL DATA:  Patient status post fall.  Left leg pain. EXAM: PORTABLE PELVIS 1-2 VIEWS COMPARISON:  CT abdomen pelvis 12/19/2015. FINDINGS: Normal anatomic alignment. No evidence for acute fracture or dislocation. Vascular calcifications. IMPRESSION: No acute osseous abnormality. Electronically Signed   By: Lovey Newcomer M.D.   On: 12/19/2015 08:23    Lab Data:  CBC:  Recent Labs Lab 12/18/15 1956 12/19/15 0115  WBC 5.6 6.0  NEUTROABS 3.1 4.1  HGB 13.6 13.0  HCT 38.4* 36.7*  MCV 80.2 80.0  PLT 247 [id number]   Basic Metabolic Panel:  Recent Labs Lab 12/18/15 1956 12/19/15 0115  NA 129* 131*  K 3.6 4.1  CL 98* 99*  CO2 18* 20*  GLUCOSE 291* 245*  BUN 15 15  CREATININE 1.27* 1.26*  CALCIUM 9.1 9.2   GFR: Estimated Creatinine Clearance: 67.8 mL/min (by C-G formula based on Cr of 1.26). Liver Function Tests:  Recent Labs Lab 12/19/15 0115  AST 19  ALT 18  ALKPHOS 70  BILITOT 0.4  PROT 6.5  ALBUMIN 3.5    Recent  Labs Lab 12/19/15 0737  LIPASE 26    Recent Labs Lab 12/19/15 0737  AMMONIA 30   Coagulation Profile: No results for input(s): INR, PROTIME in the last 168 hours. Cardiac Enzymes:  Recent Labs Lab 12/19/15 0115 12/19/15 0626 12/19/15 0737  CKTOTAL  --   --  140  TROPONINI <0.03 <0.03  --    BNP (last 3 results) No results for input(s): PROBNP in the last 8760 hours. HbA1C: No results for input(s): HGBA1C in the last 72 hours. CBG: No results for input(s): GLUCAP in the last 168 hours. Lipid Profile: No results for input(s): CHOL, HDL, LDLCALC, TRIG, CHOLHDL, LDLDIRECT in the last 72 hours. Thyroid Function Tests: No results for input(s): TSH, T4TOTAL, FREET4, T3FREE, THYROIDAB in the last 72 hours. Anemia Panel: No results for input(s): VITAMINB12, FOLATE, FERRITIN, TIBC, IRON, RETICCTPCT in the last 72 hours. Urine analysis:    Component Value Date/Time   COLORURINE YELLOW 09/27/2015 1210   APPEARANCEUR TURBID* 09/27/2015 1210   LABSPEC 1.021 09/27/2015 1210   PHURINE 5.0 09/27/2015 1210   GLUCOSEU 250* 09/27/2015 1210   HGBUR TRACE* 09/27/2015 1210   BILIRUBINUR NEGATIVE 09/27/2015 1210   KETONESUR NEGATIVE 09/27/2015 1210   PROTEINUR 100* 09/27/2015 1210   NITRITE NEGATIVE 09/27/2015 1210   LEUKOCYTESUR NEGATIVE 09/27/2015 1210     Kess Mcilwain M.D. [facility] 12/19/2015, 11:29 AM  Pager: [phone] Between 7am to 7pm - call Pager - [phone]  After 7pm go to [email or web address] - password [email or web address]  Call night coverage person covering after 7pm

## 2015-12-19 NOTE — Plan of Care (Signed)
Problem: Phase II Progression Outcomes Goal: Hemodynamically stable Outcome: Progressing VSS Goal: Anginal pain relieved Outcome: Progressing Denies Chest Pain. Goal: If positive for MI, change to MI Path Outcome: Completed/Met Date Met:  12/19/15 Troponin Negative

## 2015-12-19 NOTE — Care Management Obs Status (Signed)
[facility] NOTIFICATION   Patient Details  Name: Damione Posten MRN: [id number] Date of Birth: 1951/04/14   Medicare Observation Status Notification Given:  Yes    CrutchfieldAntony Haste, RN 12/19/2015, 2:18 PM

## 2015-12-19 NOTE — H&P (Addendum)
History and Physical    Antonio Guerra HQP:[id number] DOB: 06-20-1951 DOA: 12/18/2015  PCP: Benito Mccreedy, MD  Patient coming from: Home.  History obtained from patient's daughter as patient is intoxicated with alcohol.  Chief Complaint: Chest pain.  HPI: Antonio Guerra is a 65 y.o. male with medical history significant of CAD status post stenting last cardiac cath in March 2017, ischemic cardiomyopathy not EF measured was 15-20% March 2017, alcohol abuse, hypertension diabetes mellitus, was brought to the ER the patient complained of chest pain. As per the family patient was drinking alcohol last evening when suddenly started complaining of chest pain. Patient also fell off the chair. But did not lose consciousness. In the ER patient's EKG cardiac markers were negative. It is not possible to get a good history from the patient as patient is intoxicated. On my exam patient is mildly lethargic and states that he had some chest pain earlier. On my exam patient also has abdominal tenderness around the epigastric area. Patient denies any nausea vomiting or diarrhea. Moves all extremities but is confused probably from alcohol intoxication.   ED Course: EKG and chest x-ray were unremarkable. Cardiac markers were negative. Metabolic panel shows metabolic acidosis probably from alcoholism.  Review of Systems: As per HPI otherwise 10 point review of systems negative.    Past Medical History  Diagnosis Date  . Coronary artery disease     a. 2011 MI x 2 with PCI: stent x 2 (LAD and RI) @ [facility] in [place], [place];  b. 04/2014 Cath/PCI: LM 10-20, LAD 40p, 66m 869mSR(3.5x38 Xience DES), 50apical, LCX 20 diffuse, RI 50p, 2075mR, RCA 40-31m59md.  . Diabetes mellitus without complication (HCC)[place]. Hypertension   [facility]  . Hyperlipidemia   . Ischemic cardiomyopathy     a. 04/2014 Echo: EF 20-25%, anteroseptal, inferior, & basal-mid inferoseptal AK. Sev apical/ant HK, Ao  sclerosis, mild MR, triv PI.  . ChMarland Kitchenonic systolic congestive heart failure (HCC)[place]  a. 04/2014 Echo: EF 20-25%.  . HyMarland Kitchenertensive urgency 09/27/2015  . Acute on chronic combined systolic (congestive) and diastolic (congestive) heart failure (HCC)[place]10/2017    Past Surgical History  Procedure Laterality Date  . Ankle surgery    . Coronary stent placement    . Left heart catheterization with coronary/graft angiogram N/A 05/11/2014    Procedure: LEFT HEART CATHETERIZATION WITH COROBeatrix Fettersurgeon: ChriBurnell Blanks;  [facility];  Service: Cardiovascular;  Laterality: N/A;  . Percutaneous coronary stent intervention (pci-s)  05/11/2014    Procedure: PERCUTANEOUS CORONARY STENT INTERVENTION (PCI-S);  Surgeon: ChriBurnell Blanks;  [facility];  Service: Cardiovascular;;  . Fractional flow reserve wire  05/11/2014    Procedure: FRACTIONAL FLOW RESERVE WIRE;  Surgeon: ChriBurnell Blanks;  [facility];  Service: Cardiovascular;;  . Cardiac catheterization N/A 09/30/2015    Procedure: Left Heart Cath and Coronary Angiography;  Surgeon: ChriBurnell Blanks;  [facility];  Service: Cardiovascular;  Laterality: N/A;     reports that he has never smoked. He has never used smokeless tobacco. He reports that he does not drink alcohol or use illicit drugs.  Allergies  Allergen Reactions  . Tape Itching and Rash    Family History  Problem Relation Age of Onset  . Diabetes Mother   . CAD Neg Hx     Prior to Admission medications  Medication Sig Start Date End Date Taking? Authorizing Provider  ACCU-CHEK AVIVA PLUS test strip Use as directed 10/02/15  Yes Historical Provider, MD  ACCU-CHEK SOFTCLIX LANCETS lancets Use as directed 10/02/15  Yes Historical Provider, MD  aspirin EC 81 MG tablet Take 81 mg by mouth daily.   Yes Historical Provider, MD  blood glucose meter kit and supplies Dispense based on  patient and insurance preference. Use up to four times daily as directed. (FOR ICD-9 250.00, 250.01). 10/02/15  Yes Ripudeep K Rai, MD  carvedilol (COREG) 25 MG tablet Take 1 tablet (25 mg total) by mouth 2 (two) times daily with a meal. 05/12/14  Yes Brett Canales, PA-C  folic acid (FOLVITE) 1 MG tablet Take 1 mg by mouth daily.   Yes Historical Provider, MD  furosemide (LASIX) 40 MG tablet Take 1 tablet (40 mg total) by mouth 2 (two) times daily. 10/02/15  Yes Bhavinkumar Bhagat, PA  hydrALAZINE (APRESOLINE) 25 MG tablet Take 1 tablet (25 mg total) by mouth every 8 (eight) hours. Patient taking differently: Take 25 mg by mouth daily.  10/02/15  Yes Bhavinkumar Bhagat, PA  insulin glargine (LANTUS) 100 UNIT/ML injection Inject 0.2-0.25 mLs (20-25 Units total) into the skin 2 (two) times daily. Take 20 units every morning and 25 units at bedtime. 10/02/15  Yes Ripudeep Krystal Eaton, MD  Insulin Syringe-Needle U-100 (INSULIN SYRINGE .5CC/31GX5/16") 31G X 5/16" 0.5 ML MISC Use as directed 10/02/15  Yes Historical Provider, MD  Insulin Syringes, Disposable, U-100 0.5 ML MISC Take Lantus insulin as directed    Diagnosis: Insulin dependent diabetes mellitus ICD10 code Z79.4 10/02/15  Yes Ripudeep K Rai, MD  isosorbide mononitrate (IMDUR) 30 MG 24 hr tablet Take 1 tablet (30 mg total) by mouth daily. 10/02/15  Yes Bhavinkumar Bhagat, PA  ivabradine (CORLANOR) 5 MG TABS tablet Take 1 tablet (5 mg total) by mouth 2 (two) times daily with a meal. 10/02/15  Yes Bhavinkumar Bhagat, PA  metFORMIN (GLUCOPHAGE) 500 MG tablet Take 1 tablet (500 mg total) by mouth daily with breakfast. 10/02/15  Yes Ripudeep K Rai, MD  nitroGLYCERIN (NITROSTAT) 0.4 MG SL tablet Place 1 tablet (0.4 mg total) under the tongue every 5 (five) minutes as needed for chest pain. 05/12/14  Yes Brett Canales, PA-C  spironolactone (ALDACTONE) 25 MG tablet Take 1 tablet (25 mg total) by mouth daily. 10/02/15  Yes Bhavinkumar Bhagat, PA  atorvastatin  (LIPITOR) 80 MG tablet Take 1 tablet (80 mg total) by mouth daily at 6 PM. Patient not taking: Reported on 12/18/2015 05/12/14   Brett Canales, PA-C    Physical Exam: Filed Vitals:   12/18/15 2130 12/18/15 2300 12/18/15 2326 12/19/15 0000  BP: 122/88 144/91 174/104 152/93  Pulse: 102 103 105 102  Temp:   98.4 F (36.9 C)   TempSrc:   Oral   Resp: _0 Height:   _1  (1.854 m)   Weight:   193 lb 11.2 oz (87.862 kg)   SpO2: 97% 99% 96% 100%      Constitutional: Not in distress. Filed Vitals:   12/18/15 2130 12/18/15 2300 12/18/15 2326 12/19/15 0000  BP: 122/88 144/91 174/104 152/93  Pulse: 102 103 105 102  Temp:   98.4 F (36.9 C)   TempSrc:   Oral   Resp: _2 Height:   _3  (1.854 m)   Weight:   193 lb 11.2 oz (87.862 kg)   SpO2: 97% 99%  96% 100%   Eyes: Anicteric no pallor. ENMT: No discharge from the ears eyes nose or mouth. Neck: No mass felt. No JVD appreciated. Respiratory: No rhonchi or crepitations. Cardiovascular: S1 and S2 heard. Abdomen: Epigastric tenderness no guarding or rigidity. Musculoskeletal: No edema. Skin: No rash. Neurologic: Mildly lethargic but follows commands and moves all extremities. Perla positive. No facial asymmetry. Psychiatric: Lethargic from alcohol intoxication.   Labs on Admission: I have personally reviewed following labs and imaging studies  CBC:  Recent Labs Lab 12/18/15 1956  WBC 5.6  NEUTROABS 3.1  HGB 13.6  HCT 38.4*  MCV 80.2  PLT 443   Basic Metabolic Panel:  Recent Labs Lab 12/18/15 1956  NA 129*  K 3.6  CL 98*  CO2 18*  GLUCOSE 291*  BUN 15  CREATININE 1.27*  CALCIUM 9.1   GFR: Estimated Creatinine Clearance: 67.3 mL/min (by C-G formula based on Cr of 1.27). Liver Function Tests: No results for input(s): AST, ALT, ALKPHOS, BILITOT, PROT, ALBUMIN in the last 168 hours. No results for input(s): LIPASE, AMYLASE in the last 168 hours. No results for input(s): AMMONIA in the last 168  hours. Coagulation Profile: No results for input(s): INR, PROTIME in the last 168 hours. Cardiac Enzymes: No results for input(s): CKTOTAL, CKMB, CKMBINDEX, TROPONINI in the last 168 hours. BNP (last 3 results) No results for input(s): PROBNP in the last 8760 hours. HbA1C: No results for input(s): HGBA1C in the last 72 hours. CBG: No results for input(s): GLUCAP in the last 168 hours. Lipid Profile: No results for input(s): CHOL, HDL, LDLCALC, TRIG, CHOLHDL, LDLDIRECT in the last 72 hours. Thyroid Function Tests: No results for input(s): TSH, T4TOTAL, FREET4, T3FREE, THYROIDAB in the last 72 hours. Anemia Panel: No results for input(s): VITAMINB12, FOLATE, FERRITIN, TIBC, IRON, RETICCTPCT in the last 72 hours. Urine analysis:    Component Value Date/Time   COLORURINE YELLOW 09/27/2015 1210   APPEARANCEUR TURBID* 09/27/2015 1210   LABSPEC 1.021 09/27/2015 1210   PHURINE 5.0 09/27/2015 1210   GLUCOSEU 250* 09/27/2015 1210   HGBUR TRACE* 09/27/2015 1210   BILIRUBINUR NEGATIVE 09/27/2015 1210   KETONESUR NEGATIVE 09/27/2015 1210   PROTEINUR 100* 09/27/2015 1210   NITRITE NEGATIVE 09/27/2015 1210   LEUKOCYTESUR NEGATIVE 09/27/2015 1210   Sepsis Labs: _0 (procalcitonin:4,lacticidven:4) )No results found for this or any previous visit (from the past 240 hour(s)).   Radiological Exams on Admission: Dg Chest 2 View  12/18/2015  CLINICAL DATA:  Shortness of breath. Left-sided intermittent chest pain. EXAM: CHEST  2 VIEW COMPARISON:  September 27, 2015 FINDINGS: The heart, hila, and mediastinum are unchanged. There is a mildly tortuous thoracic aorta which is stable. No pulmonary nodules or masses. No focal infiltrates. IMPRESSION: No acute abnormalities or changes identified. Electronically Signed   By: Dorise Bullion III M.D   On: 12/18/2015 20:41    EKG: Independently reviewed. Sinus tachycardia with LVH.  Assessment/Plan Principal Problem:   Chest pain Active Problems:    Hypertension   Ischemic cardiomyopathy   Coronary artery disease   Chronic systolic congestive heart failure (HCC)   Type 2 diabetes mellitus with vascular disease ([place])   Alcohol intoxication ([place])    #1. Chest pain/epigastric tenderness - among the differentials include possible alcoholism. However since patient has history of CAD we will cycle cardiac markers continue antiplatelet agents, Coreg, statins, Imdur. Since on my exam patient does have epigastric tenderness I have ordered a lipase and CT of the abdomen. #2. Alcohol intoxication/acute encephalopathy -  patient has been placed on CIWA protocol with IV Ativan and thiamine. The patient had a fall we will check CT head and x-ray pelvis. Check urine drug screen and ammonia levels. #3. Ischemic cardiomyopathy last EF measured was 15-20% on March 17 - since patient is tachycardic and has metabolic acidosis and possible pancreatitis and holding the patient's diuretics for now and will check lactate follow metabolic panel and gently hydrate for now and closely follow respiratory status. #4. Diabetes mellitus type 2 - continue Lantus with sliding scale coverage. #5. Hyperlipidemia on statins. #6. Hypertension - continue hydralazine, Coreg and Imdur.  X-ray of the pelvis is pending. Since patient has metabolic acidosis lactate levels are pending along with lipase and repeat metabolic panel. Check urine drug screen.   DVT prophylaxis: Lovenox. Code Status: Full code.  Family Communication: Patient's daughter.  Disposition Plan: Home.  Consults called: Cardiology consult through email.  Admission status: Observation. Stepdown.    Rise Patience MD [facility] Pager [phone].  If 7PM-7AM, please contact night-coverage [email or web address] Password [email or web address]  12/19/2015, 1:03 AM

## 2015-12-20 ENCOUNTER — Observation Stay (HOSPITAL_COMMUNITY): Payer: Medicaid Other

## 2015-12-20 ENCOUNTER — Encounter (HOSPITAL_COMMUNITY): Payer: Self-pay | Admitting: General Practice

## 2015-12-20 DIAGNOSIS — F19980 Other psychoactive substance use, unspecified with psychoactive substance-induced anxiety disorder: Secondary | ICD-10-CM | POA: Diagnosis not present

## 2015-12-20 DIAGNOSIS — R079 Chest pain, unspecified: Secondary | ICD-10-CM | POA: Diagnosis not present

## 2015-12-20 DIAGNOSIS — I5022 Chronic systolic (congestive) heart failure: Secondary | ICD-10-CM | POA: Diagnosis not present

## 2015-12-20 DIAGNOSIS — R2990 Unspecified symptoms and signs involving the nervous system: Secondary | ICD-10-CM

## 2015-12-20 DIAGNOSIS — R4182 Altered mental status, unspecified: Secondary | ICD-10-CM

## 2015-12-20 DIAGNOSIS — F10120 Alcohol abuse with intoxication, uncomplicated: Secondary | ICD-10-CM | POA: Diagnosis not present

## 2015-12-20 LAB — GLUCOSE, CAPILLARY
GLUCOSE-CAPILLARY: 222 mg/dL — AB (ref 65–99)
GLUCOSE-CAPILLARY: 206 mg/dL — AB (ref 65–99)
GLUCOSE-CAPILLARY: 264 mg/dL — AB (ref 65–99)
Glucose-Capillary: 187 mg/dL — ABNORMAL HIGH (ref 65–99)

## 2015-12-20 LAB — BASIC METABOLIC PANEL
Anion gap: 6 (ref 5–15)
BUN: 14 mg/dL (ref 6–20)
CHLORIDE: 102 mmol/L (ref 101–111)
CO2: 25 mmol/L (ref 22–32)
Calcium: 9 mg/dL (ref 8.9–10.3)
Creatinine, Ser: 1.2 mg/dL (ref 0.61–1.24)
Glucose, Bld: 165 mg/dL — ABNORMAL HIGH (ref 65–99)
POTASSIUM: 3.8 mmol/L (ref 3.5–5.1)
SODIUM: 133 mmol/L — AB (ref 135–145)

## 2015-12-20 LAB — CBC
HCT: 36.8 % — ABNORMAL LOW (ref 39.0–52.0)
HEMOGLOBIN: 12.4 g/dL — AB (ref 13.0–17.0)
MCH: 27.6 pg (ref 26.0–34.0)
MCHC: 33.7 g/dL (ref 30.0–36.0)
MCV: 82 fL (ref 78.0–100.0)
Platelets: 233 10*3/uL (ref 150–400)
RBC: 4.49 MIL/uL (ref 4.22–5.81)
RDW: 13.3 % (ref 11.5–15.5)
WBC: 4.8 10*3/uL (ref 4.0–10.5)

## 2015-12-20 LAB — APTT: aPTT: 25 seconds (ref 24–37)

## 2015-12-20 LAB — PROTIME-INR
INR: 1.13 (ref 0.00–1.49)
PROTHROMBIN TIME: 14.7 s (ref 11.6–15.2)

## 2015-12-20 MED ORDER — IOPAMIDOL (ISOVUE-370) INJECTION 76%
INTRAVENOUS | Status: AC
  Filled 2015-12-20: qty 100

## 2015-12-20 NOTE — Code Documentation (Signed)
65yo male admitted for chest pain and alcohol intoxication on 12/18/2015.  Bedside RN reports patient was sitting up to eat breakfast and had difficulty using his right arm and was leaning to the right.  LKW 0800 per bedside RN.  Code stroke was activated.  Stroke RN to the bedside.  NIHSS 3, see documentation for details and code stroke times.  Patient with slight right arm drift, questionable right arm ataxia and decreased sensation on the right side on exam.  Patient with h/o stroke with residual right sided weakness which he reports "comes and goes."  Dr. Silverio Decamp to the bedside.  Patient with asterixis on exam per MD.  Of note, patient has been receiving Ativan for alcohol withdrawal and also had orthostatic hypotension with sitting to standing per RN.  No acute stroke treatment per Dr. Silverio Decamp.  Code stroke canceled.  Patient does not need STAT CT per MD but MD will order MRI.  Bedside handoff with Marya Amsler, RN.

## 2015-12-20 NOTE — Progress Notes (Signed)
C/O Rt arm weakness while trying to sit up to the side of bed to eat his breakfast. C/O of new symptoms of RUE and RLE weakness and listing to the Rt side while sitting in the bed. Also, slurred speech noted.

## 2015-12-20 NOTE — Progress Notes (Signed)
[facility]                                                                              Patient Demographics  Antonio Guerra, is a 65 y.o. male, DOB - 1951-05-04, RA:[id number]  Admit date - 12/18/2015   Admitting Physician Rise Patience, MD  Outpatient Primary MD for the patient is OSEI-BONSU,GEORGE, MD  Outpatient specialists:   LOS -   days    Chief Complaint  Patient presents with  . Chest Pain       Brief summary   Antonio Guerra is a 65 y.o. male with medical history significant of CAD status post stenting last cardiac cath in March 2017, ischemic cardiomyopathy not EF measured was 15-20% March 2017, alcohol abuse, hypertension diabetes mellitus, was brought to the ER due to  chest pain. As per the family patient was drinking alcohol last evening when suddenly started complaining of chest pain. Patient also fell off the chair, but did not lose consciousness. In the ER patient's EKG cardiac markers were negative. History was unreliable from the patient. He also complained of epigastric pain ED Course: EKG and chest x-ray were unremarkable. Cardiac markers were negative. Metabolic panel shows metabolic acidosis probably from alcoholism.   Assessment & Plan    Principal Problem:   Chest pain/Epigastric pain:Possibly due to mild acute on chronic pancreatitis, GERD/gastritis, Improving - Patient has a history of CAD  , PCI in 2011 and 2015, EF 20-25%, cardiology consulted. Per cardiology, troponin negative 2. EKG showed some inferior lateral ischemic changes consistent with old EKGs. No need for ischemic evaluation at this time, recent cath 3 months ago.  - Continue medical therapy  - CT abdomen and pelvis showed pancreatic Calcifications  likely sequelae of chronic pancreatitis caution about minimal peripancreatic inflammation may reflect mild acute on chronic pancreatitis, reflux  -Patient tolerating solid diet without any difficulty - Place on  PPI  Orthostatic/unsteadiness and right-sided weakness - Around 8 AM today, patient was noticed to have difficulty eating and using his right arm, and was leaning to the right. Patient was seen by myself at the bedside, stat orthostatic vitals were positive, no asterixis was noted, finger to nose was negative, no acute neurological deficits. Symptoms had improved however patient was still unsteady but was getting Ativan for alcohol withdrawals. Code stroke was activated, patient was seen by on-call neurology, recommended MRI of the brain which was negative for acute infarct. No further workup recommended by neurology. - PT evaluation    Alcohol intoxication ([place]) now starting acute alcohol withdrawals, last drink 4 days ago. - Placed on CIWA protocol with ativan. - Patient reports that he has high stress and was recently incarcerated, no suicidal or homicidal ideation. - Appreciate psychiatry recommendations, started on Cymbalta, Remeron    Hypertension - Currently stable, continue Coreg, hydralazine, imdur    Ischemic cardiomyopathy/ CAD (last EF 20-25%) - MI in 2011, PCI 2 LAD in [place], [place], cath 10/15 showed severe in-stent restenosis in mid LAD s/p DES. Recent cath in 3/17 showed stable CAD, and patent stents.  - No further ischemic evaluation per cardiology - Continue aspirin, statin,  Coreg, imdur    Chronic systolic congestive heart failure (HCC) - Last EF was 15-20% with akinesis of the basal -midanteroseptal myocardium. - continue isosorbide, ivabradine, cardiology following    Type 2 diabetes mellitus with vascular disease ([place]) - Continue sliding-scale insulin   Code Status: FC  DVT Prophylaxis:  Lovenox    Family Communication: Discussed in detail with the patient, all imaging results, lab results explained to the patient    Disposition Plan: Possibly dc in am  Time Spent in minutes   25 minutes  Procedures:  CT abd MRI of the brain  Consultants:    Cardiology Psych  Antimicrobials:   None    Medications  Scheduled Meds: . aspirin  324 mg Oral Once  . aspirin EC  81 mg Oral Daily  . atorvastatin  80 mg Oral q1800  . carvedilol  25 mg Oral BID WC  . enoxaparin (LOVENOX) injection  40 mg Subcutaneous Daily  . folic acid  1 mg Oral Daily  . hydrALAZINE  25 mg Oral Daily  . insulin aspart  0-9 Units Subcutaneous TID WC  . insulin glargine  20 Units Subcutaneous Daily   And  . insulin glargine  25 Units Subcutaneous QHS  . iopamidol      . isosorbide mononitrate  30 mg Oral Daily  . ivabradine  5 mg Oral BID WC  . LORazepam  0-4 mg Intravenous Q6H   Followed by  . [START ON 12/21/2015] LORazepam  0-4 mg Intravenous Q12H  . multivitamin with minerals  1 tablet Oral Daily  . thiamine  100 mg Oral Daily   Continuous Infusions:   PRN Meds:.acetaminophen **OR** acetaminophen, HYDROmorphone (DILAUDID) injection, LORazepam **OR** LORazepam, nitroGLYCERIN, nitroGLYCERIN, ondansetron **OR** ondansetron (ZOFRAN) IV, oxyCODONE-acetaminophen   Antibiotics   Anti-infectives    None        Subjective:   [name] was seen and examined today.  Complaining of unsteadiness, right-sided weakness noticed during breakfast. Patient was examined, complaining of unsteadiness, orthostatics were positive. Otherwise no neurological deficits noted. No chest pain or shortness of breath. No nausea or vomiting. Patient denies  abdominal pain, N/V/D/C, new weakness, numbess, tingling. No acute events overnight.    Objective:   Filed Vitals:   12/20/15 0820 12/20/15 0822 12/20/15 0847 12/20/15 1234  BP: 156/93 133/80 133/80   Pulse: 83 81 83   Temp:   97.9 F (36.6 C)   TempSrc:   Oral   Resp: 16 17 18 14   Height:      Weight:      SpO2: 99% 99% 100%     Intake/Output Summary (Last 24 hours) at 12/20/15 1238 Last data filed at 12/20/15 0900  Gross per 24 hour  Intake 1199.17 ml  Output      0 ml  Net 1199.17 ml     Wt  Readings from Last 3 Encounters:  12/20/15 88.315 kg (194 lb 11.2 oz)  11/25/15 88.633 kg (195 lb 6.4 oz)  10/02/15 87.363 kg (192 lb 9.6 oz)     Exam  General: Alert and oriented x 3, NAD  HEENT:    Neck:   Cardiovascular: S1 S2 auscultated, no rubs, murmurs or gallops. Regular rate and rhythm.  Respiratory: Clear to auscultation bilaterally, no wheezing, rales or rhonchi, no chest wall tenderness  Gastrointestinal: Soft, non tender on deep palpation, nondistended, + bowel sounds  Ext: no cyanosis clubbing or edema  Neuro: AAOx3, Cr N's II- XII. Strength 5/5 upper and lower extremities  bilaterally, Finger to nose intact  Skin: No rashes  Psych: Normal affect and demeanor, alert and oriented x3    Data Reviewed:  I have personally reviewed following labs and imaging studies  Micro Results Recent Results (from the past 240 hour(s))  MRSA PCR Screening     Status: None   Collection Time: 12/18/15 11:26 PM  Result Value Ref Range Status   MRSA by PCR NEGATIVE NEGATIVE Final    Comment:        The GeneXpert MRSA Assay (FDA approved for NASAL specimens only), is one component of a comprehensive MRSA colonization surveillance program. It is not intended to diagnose MRSA infection nor to guide or monitor treatment for MRSA infections.     Radiology Reports Ct Abdomen Pelvis Wo Contrast  12/19/2015  CLINICAL DATA:  Epigastric pain. EXAM: CT ABDOMEN AND PELVIS WITHOUT CONTRAST TECHNIQUE: Multidetector CT imaging of the abdomen and pelvis was performed following the standard protocol without IV contrast. COMPARISON:  None. FINDINGS: Lower chest: The included lung bases are clear. Coronary artery calcifications are seen. The heart is enlarged. There is thickening of the distal esophagus. Liver: Limited assessment for focal lesion due to lack contrast. Borderline steatosis. Hepatobiliary: Gallbladder physiologically distended, no calcified stone. No biliary dilatation.  Pancreas: Scatter pancreatic calcifications, sequela of chronic pancreatitis. Question of minimal peripancreatic edema versus motion artifact about the body. Spleen: Normal in size. Adrenal glands: No nodule. Kidneys: No hydronephrosis. No nephrolithiasis. Bilateral nonspecific perinephric edema. Question and scarring in the lower left kidney. Hypodense lesion in the lower left kidney incompletely characterized without contrast. Stomach/Bowel: Small hiatal hernia and distal esophageal wall thickening. Stomach physiologically distended with ingested contents. Small-moderate volume of stool throughout the colon without colonic wall thickening. Mild diverticulosis of the distal colon without diverticulitis. The appendix is normal. Vascular/Lymphatic: No retroperitoneal adenopathy. Abdominal aorta is normal in caliber. Atherosclerosis without aneurysm Reproductive: Prominent sized prostate gland measuring 5.5 cm transverse. Bladder: Distended.  No bladder wall thickening.  No bladder stone. Other: No free air, free fluid, or intra-abdominal fluid collection. There is fat within the right inguinal canal. Musculoskeletal: There are no acute or suspicious osseous abnormalities. Transitional lumbosacral anatomy. IMPRESSION: 1. Pancreatic calcifications, likely sequela of chronic pancreatitis. There is questionable minimal peripancreatic inflammation versus artifact. This may reflect mild acute on chronic pancreatitis. Recommend correlation with graphic enzymes. 2. Small hiatal hernia with distal esophageal wall thickening, can be seen with reflux. 3. Atherosclerosis including coronary artery calcifications. Electronically Signed   By: Jeb Levering M.D.   On: 12/19/2015 04:20   Dg Chest 2 View  12/18/2015  CLINICAL DATA:  Shortness of breath. Left-sided intermittent chest pain. EXAM: CHEST  2 VIEW COMPARISON:  September 27, 2015 FINDINGS: The heart, hila, and mediastinum are unchanged. There is a mildly tortuous thoracic  aorta which is stable. No pulmonary nodules or masses. No focal infiltrates. IMPRESSION: No acute abnormalities or changes identified. Electronically Signed   By: Dorise Bullion III M.D   On: 12/18/2015 20:41   Ct Head Wo Contrast  12/19/2015  CLINICAL DATA:  66 year old male with history of chest pain. Prior history of stroke with right-sided deficit. EXAM: CT HEAD WITHOUT CONTRAST TECHNIQUE: Contiguous axial images were obtained from the base of the skull through the vertex without intravenous contrast. COMPARISON:  No priors. FINDINGS: Low-attenuation in left posterior frontal cortex and subcortical white matter, compatible with areas of encephalomalacia/gliosis from prior left MCA territory infarction. Low-attenuation also noted in the superior aspect of the  left cerebellar hemisphere, compatible with an old infarct. Patchy and confluent areas of decreased attenuation are noted throughout the deep and periventricular white matter of the cerebral hemispheres bilaterally, compatible with chronic microvascular ischemic disease. Faint physiologic calcifications are noted in the left basal ganglia. No acute intracranial abnormalities. Specifically, no evidence of acute intracranial hemorrhage, no definite findings of acute/subacute cerebral ischemia, no mass, mass effect, hydrocephalus or abnormal intra or extra-axial fluid collections. Visualized paranasal sinuses and mastoids are well pneumatized. No acute displaced skull fractures are identified. IMPRESSION: 1. 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 No acute intracranial abnormalities. Electronically Signed   By: Vinnie Langton M.D.   On: 12/19/2015 07:10   Mr Brain Wo Contrast  12/20/2015  CLINICAL DATA:  RIGHT arm weakness which began earlier today. Slurred speech. Patient admitted on 06/01 with depression, anxiety, and alcohol  intoxication. EXAM: MRI HEAD WITHOUT CONTRAST TECHNIQUE: Multiplanar, multiecho pulse sequences of the brain and surrounding structures were obtained without intravenous contrast. COMPARISON:  CT head 12/19/2015. FINDINGS: No evidence for acute infarction, hemorrhage, mass lesion, hydrocephalus, or extra-axial fluid. Premature for age atrophy. Confluent T2 and FLAIR hyperintensities of the white matter of a moderate to severe nature, representing chronic microvascular ischemic change. The brainstem is also affected. There is a large remote LEFT MCA territory infarct over the convexity with gliosis and encephalomalacia affecting the posterior frontal cortex and white matter, also parasagittal in location. No chronic blood products. No visible large vessel occlusion. Chronic deep white matter lacunar as well as LEFT greater than RIGHT cerebellar infarcts also noted. No midline abnormality.  Negative orbits, sinuses, and mastoids. IMPRESSION: No acute stroke is evident. Remote LEFT MCA territory infarct without evidence of acute extension or significant hemorrhage. Electronically Signed   By: Staci Righter M.D.   On: 12/20/2015 11:06   Dg Pelvis Portable  12/19/2015  CLINICAL DATA:  Patient status post fall.  Left leg pain. EXAM: PORTABLE PELVIS 1-2 VIEWS COMPARISON:  CT abdomen pelvis 12/19/2015. FINDINGS: Normal anatomic alignment. No evidence for acute fracture or dislocation. Vascular calcifications. IMPRESSION: No acute osseous abnormality. Electronically Signed   By: Lovey Newcomer M.D.   On: 12/19/2015 08:23    Lab Data:  CBC:  Recent Labs Lab 12/18/15 1956 12/19/15 0115 12/20/15 0412  WBC 5.6 6.0 4.8  NEUTROABS 3.1 4.1  --   HGB 13.6 13.0 12.4*  HCT 38.4* 36.7* 36.8*  MCV 80.2 80.0 82.0  PLT 247 234 0000000   Basic Metabolic Panel:  Recent Labs Lab 12/18/15 1956 12/19/15 0115 12/20/15 0412  NA 129* 131* 133*  K 3.6 4.1 3.8  CL 98* 99* 102  CO2 18* 20* 25  GLUCOSE 291* 245* 165*  BUN 15  15 14   CREATININE 1.27* 1.26* 1.20  CALCIUM 9.1 9.2 9.0   GFR: Estimated Creatinine Clearance: 71.2 mL/min (by C-G formula based on Cr of 1.2). Liver Function Tests:  Recent Labs Lab 12/19/15 0115  AST 19  ALT 18  ALKPHOS 70  BILITOT 0.4  PROT 6.5  ALBUMIN 3.5    Recent Labs Lab 12/19/15 0737  LIPASE 26    Recent Labs Lab 12/19/15 0737  AMMONIA 30   Coagulation Profile:  Recent Labs Lab 12/20/15 0913  INR 1.13   Cardiac Enzymes:  Recent Labs Lab 12/19/15 0115 12/19/15 0626 12/19/15 0737 12/19/15 1242  CKTOTAL  --   --  140  --  TROPONINI <0.03 <0.03  --  <0.03   BNP (last 3 results) No results for input(s): PROBNP in the last 8760 hours. HbA1C: No results for input(s): HGBA1C in the last 72 hours. CBG:  Recent Labs Lab 12/19/15 1155 12/19/15 1632 12/19/15 2214 12/20/15 0743  GLUCAP 183* 163* 258* 187*   Lipid Profile: No results for input(s): CHOL, HDL, LDLCALC, TRIG, CHOLHDL, LDLDIRECT in the last 72 hours. Thyroid Function Tests: No results for input(s): TSH, T4TOTAL, FREET4, T3FREE, THYROIDAB in the last 72 hours. Anemia Panel: No results for input(s): VITAMINB12, FOLATE, FERRITIN, TIBC, IRON, RETICCTPCT in the last 72 hours. Urine analysis:    Component Value Date/Time   COLORURINE YELLOW 09/27/2015 1210   APPEARANCEUR TURBID* 09/27/2015 1210   LABSPEC 1.021 09/27/2015 1210   PHURINE 5.0 09/27/2015 1210   GLUCOSEU 250* 09/27/2015 1210   HGBUR TRACE* 09/27/2015 1210   BILIRUBINUR NEGATIVE 09/27/2015 1210   KETONESUR NEGATIVE 09/27/2015 1210   PROTEINUR 100* 09/27/2015 1210   NITRITE NEGATIVE 09/27/2015 1210   LEUKOCYTESUR NEGATIVE 09/27/2015 1210     RAI,RIPUDEEP M.D. [facility] 12/20/2015, 12:38 PM  Pager: [phone] Between 7am to 7pm - call Pager - [phone]  After 7pm go to [email or web address] - password [email or web address]  Call night coverage person covering after 7pm

## 2015-12-20 NOTE — Progress Notes (Signed)
EEG completed full report to follow.

## 2015-12-20 NOTE — Progress Notes (Signed)
Pt confused, pulled IV out, removed telemetry, urinated on floor, [place], able to reorient, resting quietly in bed at present, VSS, resp even and unlabored, will continue to monitor closely.  Edward Qualia RN

## 2015-12-20 NOTE — Consult Note (Signed)
Requesting Physician: Dr.  Tana Coast    Reason for consultation:  Stroke code  HPI:                                                                                                                                         Antonio Guerra is an 65 y.o. male patient who is admitted for alcohol withdrawal symptoms, and CIWA  protocol receiving Ativan. This morning around 8 AM, his nurse noted that he slumped over to the right poorly responsive and drowsy. Hence nurse called a stroke code. Patient unable to provide any history.   Date last known well:  2017, June 2nd Time last known well:  Unknown, nurse found him slumping to right at 8 am tPA Given: No, non focal exam, unknown last normal.  Stroke Risk Factors - CHF, EF 20-25%  Past Medical History: Past Medical History  Diagnosis Date  . Coronary artery disease     a. 2011 MI x 2 with PCI: stent x 2 (LAD and RI) @ [facility] in [place], [place];  b. 04/2014 Cath/PCI: LM 10-20, LAD 40p, 56m, 97m ISR(3.5x38 Xience DES), 50apical, LCX 20 diffuse, RI 50p, 82m ISR, RCA 40-33m, 30d.  . Diabetes mellitus without complication ([place])   . Hypertension   . Stroke [facility])     a. 2012  . Hyperlipidemia   . Ischemic cardiomyopathy     a. 04/2014 Echo: EF 20-25%, anteroseptal, inferior, & basal-mid inferoseptal AK. Sev apical/ant HK, Ao sclerosis, mild MR, triv PI.  Marland Kitchen Chronic systolic congestive heart failure ([place])     a. 04/2014 Echo: EF 20-25%.  Marland Kitchen Hypertensive urgency 09/27/2015  . Acute on chronic combined systolic (congestive) and diastolic (congestive) heart failure ([place]) 09/27/2015    Past Surgical History  Procedure Laterality Date  . Ankle surgery    . Coronary stent placement    . Left heart catheterization with coronary/graft angiogram N/A 05/11/2014    Procedure: LEFT HEART CATHETERIZATION WITH Beatrix Fetters;  Surgeon: Burnell Blanks, MD;  Location: [facility] CATH LAB;  Service: Cardiovascular;  Laterality: N/A;  . Percutaneous coronary  stent intervention (pci-s)  05/11/2014    Procedure: PERCUTANEOUS CORONARY STENT INTERVENTION (PCI-S);  Surgeon: Burnell Blanks, MD;  Location: [facility] CATH LAB;  Service: Cardiovascular;;  . Fractional flow reserve wire  05/11/2014    Procedure: FRACTIONAL FLOW RESERVE WIRE;  Surgeon: Burnell Blanks, MD;  Location: [facility] CATH LAB;  Service: Cardiovascular;;  . Cardiac catheterization N/A 09/30/2015    Procedure: Left Heart Cath and Coronary Angiography;  Surgeon: Burnell Blanks, MD;  Location: [place] CV LAB;  Service: Cardiovascular;  Laterality: N/A;    Family History: Family History  Problem Relation Age of Onset  . Diabetes Mother   . CAD Neg Hx     Social History:   reports that he has never smoked. He has never used smokeless tobacco. He  reports that he does not drink alcohol or use illicit drugs.  Allergies:  Allergies  Allergen Reactions  . Tape Itching and Rash     Medications:                                                                                                                         Current facility-administered medications:  .  acetaminophen (TYLENOL) tablet 650 mg, 650 mg, Oral, Q6H PRN **OR** acetaminophen (TYLENOL) suppository 650 mg, 650 mg, Rectal, Q6H PRN, Rise Patience, MD .  aspirin chewable tablet 324 mg, 324 mg, Oral, Once, Forde Dandy, MD, 324 mg at 12/18/15 2007 .  aspirin EC tablet 81 mg, 81 mg, Oral, Daily, Rise Patience, MD, 81 mg at 12/19/15 1038 .  atorvastatin (LIPITOR) tablet 80 mg, 80 mg, Oral, q1800, Rise Patience, MD, 80 mg at 12/19/15 1728 .  carvedilol (COREG) tablet 25 mg, 25 mg, Oral, BID WC, Rise Patience, MD, 25 mg at 12/19/15 1728 .  enoxaparin (LOVENOX) injection 40 mg, 40 mg, Subcutaneous, Daily, Rise Patience, MD, 40 mg at 12/19/15 1038 .  folic acid (FOLVITE) tablet 1 mg, 1 mg, Oral, Daily, Rise Patience, MD, 1 mg at 12/19/15 1037 .  hydrALAZINE (APRESOLINE) tablet 25  mg, 25 mg, Oral, Daily, Rise Patience, MD, 25 mg at 12/19/15 1037 .  HYDROmorphone (DILAUDID) injection 1 mg, 1 mg, Intravenous, Q4H PRN, Ripudeep K Rai, MD .  insulin aspart (novoLOG) injection 0-9 Units, 0-9 Units, Subcutaneous, TID WC, Rise Patience, MD, 2 Units at 12/19/15 1729 .  insulin glargine (LANTUS) injection 20 Units, 20 Units, Subcutaneous, Daily, 20 Units at 12/19/15 1000 **AND** insulin glargine (LANTUS) injection 25 Units, 25 Units, Subcutaneous, QHS, Rise Patience, MD, 25 Units at 12/19/15 2132 .  iopamidol (ISOVUE-370) 76 % injection, , , ,  .  isosorbide mononitrate (IMDUR) 24 hr tablet 30 mg, 30 mg, Oral, Daily, Rise Patience, MD, 30 mg at 12/19/15 1037 .  ivabradine (CORLANOR) tablet 5 mg, 5 mg, Oral, BID WC, Rise Patience, MD, 5 mg at 12/19/15 1728 .  LORazepam (ATIVAN) injection 0-4 mg, 0-4 mg, Intravenous, Q6H, 2 mg at 12/20/15 0018 **FOLLOWED BY** [START ON 12/21/2015] LORazepam (ATIVAN) injection 0-4 mg, 0-4 mg, Intravenous, Q12H, Rise Patience, MD .  LORazepam (ATIVAN) tablet 1 mg, 1 mg, Oral, Q6H PRN **OR** LORazepam (ATIVAN) injection 1 mg, 1 mg, Intravenous, Q6H PRN, Rise Patience, MD .  multivitamin with minerals tablet 1 tablet, 1 tablet, Oral, Daily, Rise Patience, MD, 1 tablet at 12/19/15 1037 .  nitroGLYCERIN (NITROSTAT) SL tablet 0.4 mg, 0.4 mg, Sublingual, Q5 min PRN, Forde Dandy, MD, 0.4 mg at 12/18/15 2102 .  nitroGLYCERIN (NITROSTAT) SL tablet 0.4 mg, 0.4 mg, Sublingual, Q5 min PRN, Rise Patience, MD .  ondansetron [facility]) tablet 4 mg, 4 mg, Oral, Q6H PRN **OR** ondansetron (ZOFRAN) injection 4 mg, 4 mg, Intravenous, Q6H PRN, Jaquita Rector  Aida Puffer, MD .  oxyCODONE-acetaminophen (PERCOCET/ROXICET) 5-325 MG per tablet 1-2 tablet, 1-2 tablet, Oral, Q4H PRN, Ripudeep Krystal Eaton, MD, 1 tablet at 12/19/15 2053 .  thiamine (VITAMIN B-1) tablet 100 mg, 100 mg, Oral, Daily, 100 mg at 12/19/15 1037 **OR** [DISCONTINUED]  thiamine (B-1) injection 100 mg, 100 mg, Intravenous, Daily, Rise Patience, MD   ROS:                                                                                                                                       History obtained from the patient  General ROS: negative for - chills, fatigue, fever, night sweats, weight gain or weight loss Psychological ROS: negative for - behavioral disorder, hallucinations, memory difficulties, mood swings or suicidal ideation Ophthalmic ROS: negative for - blurry vision, double vision, eye pain or loss of vision ENT ROS: negative for - epistaxis, nasal discharge, oral lesions, sore throat, tinnitus or vertigo Allergy and Immunology ROS: negative for - hives or itchy/watery eyes Hematological and Lymphatic ROS: negative for - bleeding problems, bruising or swollen lymph nodes Endocrine ROS: negative for - galactorrhea, hair pattern changes, polydipsia/polyuria or temperature intolerance Respiratory ROS: negative for - cough, hemoptysis, shortness of breath or wheezing Cardiovascular ROS: negative for - chest pain, dyspnea on exertion, edema or irregular heartbeat Gastrointestinal ROS: negative for - abdominal pain, diarrhea, hematemesis, nausea/vomiting or stool incontinence Genito-Urinary ROS: negative for - dysuria, hematuria, incontinence or urinary frequency/urgency Musculoskeletal ROS: negative for - joint swelling or muscular weakness Neurological ROS: as noted in HPI Dermatological ROS: negative for rash and skin lesion changes  Neurologic Examination:                                                                                                    Today's Vitals   12/19/15 2300 12/20/15 0200 12/20/15 0400 12/20/15 0517  BP: 120/80 166/87 141/98 138/76  Pulse: 86 83 82 84  Temp:  98 F (36.7 C)  97.7 F (36.5 C)  TempSrc:  Oral  Oral  Resp:  21 13 12   Height:      Weight:    88.315 kg (194 lb 11.2 oz)  SpO2: 98% 97% 98% 98%   PainSc:        Evaluation of higher integrative functions including: Level of alertness: Slightly drowsy but follows commands appropriately Oriented to time, place and person Speech: fluent, no evidence of dysarthria or aphasia noted.  Test the following cranial nerves: 2-12 grossly intact Motor examination:  Normal tone, bulk, full 5/5 motor strength in all 4 extremities Examination of sensation : symmetric sensation to pinprick in all 4 extremities and on face Examination of deep tendon reflexes: 1+, symmetric in all extremities, normal plantars bilaterally Test coordination: Normal finger nose testing, with no evidence of limb appendicular ataxia or abnormal involuntary movements or tremors noted.   Lab Results: Basic Metabolic Panel:  Recent Labs Lab 12/18/15 1956 12/19/15 0115 12/20/15 0412  NA 129* 131* 133*  K 3.6 4.1 3.8  CL 98* 99* 102  CO2 18* 20* 25  GLUCOSE 291* 245* 165*  BUN 15 15 14   CREATININE 1.27* 1.26* 1.20  CALCIUM 9.1 9.2 9.0    Liver Function Tests:  Recent Labs Lab 12/19/15 0115  AST 19  ALT 18  ALKPHOS 70  BILITOT 0.4  PROT 6.5  ALBUMIN 3.5    Recent Labs Lab 12/19/15 0737  LIPASE 26    Recent Labs Lab 12/19/15 0737  AMMONIA 30    CBC:  Recent Labs Lab 12/18/15 1956 12/19/15 0115 12/20/15 0412  WBC 5.6 6.0 4.8  NEUTROABS 3.1 4.1  --   HGB 13.6 13.0 12.4*  HCT 38.4* 36.7* 36.8*  MCV 80.2 80.0 82.0  PLT 247 234 233    Cardiac Enzymes:  Recent Labs Lab 12/19/15 0115 12/19/15 0626 12/19/15 0737 12/19/15 1242  CKTOTAL  --   --  140  --   TROPONINI <0.03 <0.03  --  <0.03    Lipid Panel: No results for input(s): CHOL, TRIG, HDL, CHOLHDL, VLDL, LDLCALC in the last 168 hours.  CBG:  Recent Labs Lab 12/19/15 1155 12/19/15 1632 12/19/15 2214 12/20/15 0743  GLUCAP 183* 163* 258* 187*    Microbiology: Results for orders placed or performed during the hospital encounter of 12/18/15  MRSA PCR Screening      Status: None   Collection Time: 12/18/15 11:26 PM  Result Value Ref Range Status   MRSA by PCR NEGATIVE NEGATIVE Final    Comment:        The GeneXpert MRSA Assay (FDA approved for NASAL specimens only), is one component of a comprehensive MRSA colonization surveillance program. It is not intended to diagnose MRSA infection nor to guide or monitor treatment for MRSA infections.      Imaging: Ct Abdomen Pelvis Wo Contrast  12/19/2015  CLINICAL DATA:  Epigastric pain. EXAM: CT ABDOMEN AND PELVIS WITHOUT CONTRAST TECHNIQUE: Multidetector CT imaging of the abdomen and pelvis was performed following the standard protocol without IV contrast. COMPARISON:  None. FINDINGS: Lower chest: The included lung bases are clear. Coronary artery calcifications are seen. The heart is enlarged. There is thickening of the distal esophagus. Liver: Limited assessment for focal lesion due to lack contrast. Borderline steatosis. Hepatobiliary: Gallbladder physiologically distended, no calcified stone. No biliary dilatation. Pancreas: Scatter pancreatic calcifications, sequela of chronic pancreatitis. Question of minimal peripancreatic edema versus motion artifact about the body. Spleen: Normal in size. Adrenal glands: No nodule. Kidneys: No hydronephrosis. No nephrolithiasis. Bilateral nonspecific perinephric edema. Question and scarring in the lower left kidney. Hypodense lesion in the lower left kidney incompletely characterized without contrast. Stomach/Bowel: Small hiatal hernia and distal esophageal wall thickening. Stomach physiologically distended with ingested contents. Small-moderate volume of stool throughout the colon without colonic wall thickening. Mild diverticulosis of the distal colon without diverticulitis. The appendix is normal. Vascular/Lymphatic: No retroperitoneal adenopathy. Abdominal aorta is normal in caliber. Atherosclerosis without aneurysm Reproductive: Prominent sized prostate gland measuring  5.5 cm transverse. Bladder: Distended.  No bladder wall thickening.  No bladder stone. Other: No free air, free fluid, or intra-abdominal fluid collection. There is fat within the right inguinal canal. Musculoskeletal: There are no acute or suspicious osseous abnormalities. Transitional lumbosacral anatomy. IMPRESSION: 1. Pancreatic calcifications, likely sequela of chronic pancreatitis. There is questionable minimal peripancreatic inflammation versus artifact. This may reflect mild acute on chronic pancreatitis. Recommend correlation with graphic enzymes. 2. Small hiatal hernia with distal esophageal wall thickening, can be seen with reflux. 3. Atherosclerosis including coronary artery calcifications. Electronically Signed   By: Jeb Levering M.D.   On: 12/19/2015 04:20   Dg Chest 2 View  12/18/2015  CLINICAL DATA:  Shortness of breath. Left-sided intermittent chest pain. EXAM: CHEST  2 VIEW COMPARISON:  September 27, 2015 FINDINGS: The heart, hila, and mediastinum are unchanged. There is a mildly tortuous thoracic aorta which is stable. No pulmonary nodules or masses. No focal infiltrates. IMPRESSION: No acute abnormalities or changes identified. Electronically Signed   By: Dorise Bullion III M.D   On: 12/18/2015 20:41   Ct Head Wo Contrast  12/19/2015  CLINICAL DATA:  65 year old male with history of chest pain. Prior history of stroke with right-sided deficit. EXAM: CT HEAD WITHOUT CONTRAST TECHNIQUE: Contiguous axial images were obtained from the base of the skull through the vertex without intravenous contrast. COMPARISON:  No priors. FINDINGS: Low-attenuation in left posterior frontal cortex and subcortical white matter, compatible with areas of encephalomalacia/gliosis from prior left MCA territory infarction. Low-attenuation also noted in the superior aspect of the left cerebellar hemisphere, compatible with an old infarct. Patchy and confluent areas of decreased attenuation are noted throughout the  deep and periventricular white matter of the cerebral hemispheres bilaterally, compatible with chronic microvascular ischemic disease. Faint physiologic calcifications are noted in the left basal ganglia. No acute intracranial abnormalities. Specifically, no evidence of acute intracranial hemorrhage, no definite findings of acute/subacute cerebral ischemia, no mass, mass effect, hydrocephalus or abnormal intra or extra-axial fluid collections. Visualized paranasal sinuses and mastoids are well pneumatized. No acute displaced skull fractures are identified. IMPRESSION: 1. 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 No acute intracranial abnormalities. Electronically Signed   By: Vinnie Langton M.D.   On: 12/19/2015 07:10   Dg Pelvis Portable  12/19/2015  CLINICAL DATA:  Patient status post fall.  Left leg pain. EXAM: PORTABLE PELVIS 1-2 VIEWS COMPARISON:  CT abdomen pelvis 12/19/2015. FINDINGS: Normal anatomic alignment. No evidence for acute fracture or dislocation. Vascular calcifications. IMPRESSION: No acute osseous abnormality. Electronically Signed   By: Lovey Newcomer M.D.   On: 12/19/2015 08:23     Stroke Scales   NIHSS :  0  Assessment and plan:   Antonio Guerra is an 65 y.o. male patient who is admitted with alcohol withdrawal symptoms, on CIWA  protocol receiving Ativan. Nurse called a stroke or after he noticed the patient to be slumped over to the right, poorly responsive. At the time of my evaluation, his neurological examination is nonfocal. I suspect that his change in the status noted by the nurses secondary to the Ativan doses is been receiving. No clinical evidence for an acute stroke. CT of the head was done yesterday at admission which did not show acute pathology. Recommend MRI of the brain for further neurodiagnostic evaluation.    MRI brain did not show  evidence of acute stroke. chronic left MCA as well as left cerebellar  infarcts were seen.   Due to concern for mental status changes with chronic infarcts seen on the brain MRI, ordered an EEG for further evaluation. EEG has been normal.  At this time, no further neurodiagnostic recommendations from neurology service. Continue to monitor for alcohol withdrawal symptoms.  We'll sign off. Please call for any further questions.

## 2015-12-21 DIAGNOSIS — I5022 Chronic systolic (congestive) heart failure: Secondary | ICD-10-CM | POA: Diagnosis not present

## 2015-12-21 DIAGNOSIS — R1013 Epigastric pain: Secondary | ICD-10-CM | POA: Diagnosis not present

## 2015-12-21 DIAGNOSIS — F10120 Alcohol abuse with intoxication, uncomplicated: Secondary | ICD-10-CM | POA: Diagnosis not present

## 2015-12-21 DIAGNOSIS — R079 Chest pain, unspecified: Secondary | ICD-10-CM | POA: Diagnosis not present

## 2015-12-21 LAB — CBC
HEMATOCRIT: 36.1 % — AB (ref 39.0–52.0)
HEMOGLOBIN: 12.4 g/dL — AB (ref 13.0–17.0)
MCH: 27.7 pg (ref 26.0–34.0)
MCHC: 34.3 g/dL (ref 30.0–36.0)
MCV: 80.6 fL (ref 78.0–100.0)
Platelets: 235 10*3/uL (ref 150–400)
RBC: 4.48 MIL/uL (ref 4.22–5.81)
RDW: 13.1 % (ref 11.5–15.5)
WBC: 4.1 10*3/uL (ref 4.0–10.5)

## 2015-12-21 LAB — BASIC METABOLIC PANEL
ANION GAP: 7 (ref 5–15)
BUN: 15 mg/dL (ref 6–20)
CHLORIDE: 105 mmol/L (ref 101–111)
CO2: 24 mmol/L (ref 22–32)
CREATININE: 1.23 mg/dL (ref 0.61–1.24)
Calcium: 9.3 mg/dL (ref 8.9–10.3)
GFR calc non Af Amer: >60 mL/min (ref >60)
Glucose, Bld: 172 mg/dL — ABNORMAL HIGH (ref 65–99)
POTASSIUM: 4.3 mmol/L (ref 3.5–5.1)
SODIUM: 136 mmol/L (ref 135–145)

## 2015-12-21 LAB — GLUCOSE, CAPILLARY
GLUCOSE-CAPILLARY: 215 mg/dL — AB (ref 65–99)
Glucose-Capillary: 137 mg/dL — ABNORMAL HIGH (ref 65–99)

## 2015-12-21 MED ORDER — PANTOPRAZOLE SODIUM 40 MG PO TBEC: 40.0000 mg | DELAYED_RELEASE_TABLET | Freq: Every day | ORAL | Status: DC

## 2015-12-21 MED ORDER — THIAMINE HCL 100 MG PO TABS: 100.0000 mg | ORAL_TABLET | Freq: Every day | ORAL | Status: DC

## 2015-12-21 MED ORDER — ATORVASTATIN CALCIUM 80 MG PO TABS: 80.0000 mg | ORAL_TABLET | Freq: Every day | ORAL | Status: DC

## 2015-12-21 MED ORDER — ADULT MULTIVITAMIN W/MINERALS CH: 1.0000 | ORAL_TABLET | Freq: Every day | ORAL | Status: DC

## 2015-12-21 MED ORDER — CARVEDILOL 25 MG PO TABS: 25.0000 mg | ORAL_TABLET | Freq: Two times a day (BID) | ORAL | Status: DC

## 2015-12-21 NOTE — Plan of Care (Signed)
Problem: Discharge Progression Outcomes Goal: No anginal pain Outcome: Completed/Met Date Met:  12/21/15 Pt remains free from chest pain Goal: Discharge plan in place and appropriate Outcome: Completed/Met Date Met:  12/21/15 Pt being discharged home today with wife Goal: Tolerates diet Outcome: Completed/Met Date Met:  12/21/15 Pt able to tolerate current diet with no difficulties   Problem: Education: Goal: Knowledge of [facility] Education information/materials will improve Outcome: Completed/Met Date Met:  12/21/15 Pt educated throughout hospitalization regarding tests, procedures, lab results, how to avoid orthostatic hypotension, and available resources.

## 2015-12-21 NOTE — Discharge Instructions (Addendum)
Nonspecific Chest Pain  °Chest pain can be caused by many different conditions. There is always a chance that your pain could be related to something serious, such as a heart attack or a blood clot in your lungs. Chest pain can also be caused by conditions that are not life-threatening. If you have chest pain, it is very important to follow up with your health care provider. °CAUSES  °Chest pain can be caused by: °· Heartburn. °· Pneumonia or bronchitis. °· Anxiety or stress. °· Inflammation around your heart (pericarditis) or lung (pleuritis or pleurisy). °· A blood clot in your lung. °· A collapsed lung (pneumothorax). It can develop suddenly on its own (spontaneous pneumothorax) or from trauma to the chest. °· Shingles infection (varicella-zoster virus). °· Heart attack. °· Damage to the bones, muscles, and cartilage that make up your chest wall. This can include: °¨ Bruised bones due to injury. °¨ Strained muscles or cartilage due to frequent or repeated coughing or overwork. °¨ Fracture to one or more ribs. °¨ Sore cartilage due to inflammation (costochondritis). °RISK FACTORS  °Risk factors for chest pain may include: °· Activities that increase your risk for trauma or injury to your chest. °· Respiratory infections or conditions that cause frequent coughing. °· Medical conditions or overeating that can cause heartburn. °· Heart disease or family history of heart disease. °· Conditions or health behaviors that increase your risk of developing a blood clot. °· Having had chicken pox (varicella zoster). °SIGNS AND SYMPTOMS °Chest pain can feel like: °· Burning or tingling on the surface of your chest or deep in your chest. °· Crushing, pressure, aching, or squeezing pain. °· Dull or sharp pain that is worse when you move, cough, or take a deep breath. °· Pain that is also felt in your back, neck, shoulder, or arm, or pain that spreads to any of these areas. °Your chest pain may come and go, or it may stay  constant. °DIAGNOSIS °Lab tests or other studies may be needed to find the cause of your pain. Your health care provider may have you take a test called an ambulatory ECG (electrocardiogram). An ECG records your heartbeat patterns at the time the test is performed. You may also have other tests, such as: °· Transthoracic echocardiogram (TTE). During echocardiography, sound waves are used to create a picture of all of the heart structures and to look at how blood flows through your heart. °· Transesophageal echocardiogram (TEE). This is a more advanced imaging test that obtains images from inside your body. It allows your health care provider to see your heart in finer detail. °· Cardiac monitoring. This allows your health care provider to monitor your heart rate and rhythm in real time. °· Holter monitor. This is a portable device that records your heartbeat and can help to diagnose abnormal heartbeats. It allows your health care provider to track your heart activity for several days, if needed. °· Stress tests. These can be done through exercise or by taking medicine that makes your heart beat more quickly. °· Blood tests. °· Imaging tests. °TREATMENT  °Your treatment depends on what is causing your chest pain. Treatment may include: °· Medicines. These may include: °¨ Acid blockers for heartburn. °¨ Anti-inflammatory medicine. °¨ Pain medicine for inflammatory conditions. °¨ Antibiotic medicine, if an infection is present. °¨ Medicines to dissolve blood clots. °¨ Medicines to treat coronary artery disease. °· Supportive care for conditions that do not require medicines. This may include: °¨ Resting. °¨ Applying heat   or cold packs to injured areas. °¨ Limiting activities until pain decreases. °HOME CARE INSTRUCTIONS °· If you were prescribed an antibiotic medicine, finish it all even if you start to feel better. °· Avoid any activities that bring on chest pain. °· Do not use any tobacco products, including  cigarettes, chewing tobacco, or electronic cigarettes. If you need help quitting, ask your health care provider. °· Do not drink alcohol. °· Take medicines only as directed by your health care provider. °· Keep all follow-up visits as directed by your health care provider. This is important. This includes any further testing if your chest pain does not go away. °· If heartburn is the cause for your chest pain, you may be told to keep your head raised (elevated) while sleeping. This reduces the chance that acid will go from your stomach into your esophagus. °· Make lifestyle changes as directed by your health care provider. These may include: °¨ Getting regular exercise. Ask your health care provider to suggest some activities that are safe for you. °¨ Eating a heart-healthy diet. A registered dietitian can help you to learn healthy eating options. °¨ Maintaining a healthy weight. °¨ Managing diabetes, if necessary. °¨ Reducing stress. °SEEK MEDICAL CARE IF: °· Your chest pain does not go away after treatment. °· You have a rash with blisters on your chest. °· You have a fever. °SEEK IMMEDIATE MEDICAL CARE IF:  °· Your chest pain is worse. °· You have an increasing cough, or you cough up blood. °· You have severe abdominal pain. °· You have severe weakness. °· You faint. °· You have chills. °· You have sudden, unexplained chest discomfort. °· You have sudden, unexplained discomfort in your arms, back, neck, or jaw. °· You have shortness of breath at any time. °· You suddenly start to sweat, or your skin gets clammy. °· You feel nauseous or you vomit. °· You suddenly feel light-headed or dizzy. °· Your heart begins to beat quickly, or it feels like it is skipping beats. °These symptoms may represent a serious problem that is an emergency. Do not wait to see if the symptoms will go away. Get medical help right away. Call your local emergency services (911 in the [place] Do not drive yourself to the hospital. °  °This  information is not intended to replace advice given to you by your health care provider. Make sure you discuss any questions you have with your health care provider. °  °Document Released: 04/15/2005 Document Revised: 07/27/2014 Document Reviewed: 02/09/2014 °[employer] Interactive Patient Education ©2016 [employer] ° °
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
## 2015-12-21 NOTE — Evaluation (Signed)
Physical Therapy Evaluation Patient Details Name: Antonio Guerra MRN: [id number] DOB: [birth year] Today's Date: 12/21/2015   History of Present Illness  65 y.o. male admitted with chest pain.  W/U reveals mild acute on chronic pancreatitis.  On 12/20/15, patient noted to have difficulty eating and using RUE.  Code stroke called, MRI negative, symptoms resolved, no further workup recommended.    Clinical Impression  Patient presents with mild dependencies in gait, balance and mobility, most likely due to decreased mobility over past few days.  Patient ambulated with cane PTA.  During PT patient ambulated 150 feet with monitor cart (simulated cane) with no loss of balance and then 150 feet with slight unsteadiness requiring supervision assistance.  I did recommend to patient to continue to use cane at discharge.  Feel patient safe for d/c home with wife and cane from PT standpoint.      Follow Up Recommendations No PT follow up    Equipment Recommendations  None recommended by PT    Recommendations for Other Services       Precautions / Restrictions Precautions Precautions: Fall Restrictions Weight Bearing Restrictions: No      Mobility  Bed Mobility Overal bed mobility: Independent                Transfers Overall transfer level: Independent                  Ambulation/Gait Ambulation/Gait assistance: Supervision Ambulation Distance (Feet): 300 Feet Assistive device: None Gait Pattern/deviations: Step-through pattern;Wide base of support Gait velocity: decreased   General Gait Details:  Pushed monitor cart for 1/2 of session with no loss of balance and no assistive device other 1/2 with slight unsteadiness requiring supervision.  Did ambulate with slightly widened base of support.  Stairs            Wheelchair Mobility    Modified Rankin (Stroke Patients Only)       Balance Overall balance assessment: Modified Independent Sitting-balance support: No  upper extremity supported Sitting balance-Leahy Scale: Normal     Standing balance support: No upper extremity supported Standing balance-Leahy Scale: Good                               Pertinent Vitals/Pain Pain Assessment: 0-10 Pain Score: 3  Pain Location: left knee Pain Descriptors / Indicators: Sore Pain Intervention(s): Limited activity within patient's tolerance;Monitored during session    Home Living Family/patient expects to be discharged to:: Private residence Living Arrangements: Spouse/significant other Available Help at Discharge: Family Type of Home: Apartment Home Access: Level entry     Home Layout: One level Home Equipment: Cane - single point      Prior Function Level of Independence: Independent with assistive device(s)               Hand Dominance        Extremity/Trunk Assessment   Upper Extremity Assessment: Overall WFL for tasks assessed           Lower Extremity Assessment: Overall WFL for tasks assessed      Cervical / Trunk Assessment: Normal  Communication   Communication: No difficulties (mumbles at times)  Cognition Arousal/Alertness: Awake/alert Behavior During Therapy: WFL for tasks assessed/performed Overall Cognitive Status: Within Functional Limits for tasks assessed                      General Comments  Exercises        Assessment/Plan    PT Assessment Patent does not need any further PT services  PT Diagnosis Generalized weakness   PT Problem List    PT Treatment Interventions     PT Goals (Current goals can be found in the Care Plan section) Acute Rehab PT Goals PT Goal Formulation: All assessment and education complete, DC therapy    Frequency     Barriers to discharge        Co-evaluation               End of Session Equipment Utilized During Treatment: Gait belt Activity Tolerance: Patient tolerated treatment well Patient left: in chair;with nursing/sitter  in room Nurse Communication: Mobility status    Functional Assessment Tool Used: clinical judgement Functional Limitation: Mobility: Walking and moving around Mobility: Walking and Moving Around Current Status [id number]): At least 1 percent but less than 20 percent impaired, limited or restricted Mobility: Walking and Moving Around Goal Status [phone]): At least 1 percent but less than 20 percent impaired, limited or restricted Mobility: Walking and Moving Around Discharge Status [phone]): At least 1 percent but less than 20 percent impaired, limited or restricted    Time: 0917-0933 PT Time Calculation (min) (ACUTE ONLY): 16 min   Charges:   PT Evaluation $PT Eval Low Complexity: 1 Procedure     PT G Codes:   PT G-Codes **NOT FOR INPATIENT CLASS** Functional Assessment Tool Used: clinical judgement Functional Limitation: Mobility: Walking and moving around Mobility: Walking and Moving Around Current Status [id number]): At least 1 percent but less than 20 percent impaired, limited or restricted Mobility: Walking and Moving Around Goal Status [phone]): At least 1 percent but less than 20 percent impaired, limited or restricted Mobility: Walking and Moving Around Discharge Status [phone]): At least 1 percent but less than 20 percent impaired, limited or restricted    Shanna Cisco 12/21/2015, 10:16 AM  12/21/2015 [place], [place] [phone]

## 2015-12-22 DIAGNOSIS — R4182 Altered mental status, unspecified: Secondary | ICD-10-CM | POA: Insufficient documentation

## 2015-12-22 NOTE — Procedures (Signed)
History: Antonio Guerra is an 65 y.o. male patient with altered mental status . Routine inpatient EEG was performed for further evaluation.   Patient Active Problem List   Diagnosis Date Noted  . Type 2 diabetes mellitus with vascular disease ([place]) 12/19/2015  . Alcohol intoxication ([place]) 12/19/2015  . Substance-induced anxiety disorder ([place]) 12/19/2015  . Epigastric pain   . Non compliance w medication regimen   . Elevated troponin   . Type 2 diabetes mellitus ([place]) 09/29/2015  . Hypertensive urgency 09/27/2015  . Acute on chronic combined systolic (congestive) and diastolic (congestive) heart failure ([place]) 09/27/2015  . Ischemic cardiomyopathy   . Hyperlipidemia   . Coronary artery disease   . Chronic systolic congestive heart failure ([place])   . Unstable angina ([place]) 05/11/2014  . Stroke ([place])   . Hypertension   . Diabetes mellitus without complication ([place])   . Chest pain 05/09/2014    No current facility-administered medications for this encounter.  Current outpatient prescriptions:  .  ACCU-CHEK AVIVA PLUS test strip, Use as directed, Disp: , Rfl: 0 .  ACCU-CHEK SOFTCLIX LANCETS lancets, Use as directed, Disp: , Rfl: 0 .  aspirin EC 81 MG tablet, Take 81 mg by mouth daily., Disp: , Rfl:  .  blood glucose meter kit and supplies, Dispense based on patient and insurance preference. Use up to four times daily as directed. (FOR ICD-9 250.00, 250.01)., Disp: 1 each, Rfl: 0 .  folic acid (FOLVITE) 1 MG tablet, Take 1 mg by mouth daily., Disp: , Rfl:  .  furosemide (LASIX) 40 MG tablet, Take 1 tablet (40 mg total) by mouth 2 (two) times daily., Disp: 60 tablet, Rfl: 3 .  hydrALAZINE (APRESOLINE) 25 MG tablet, Take 1 tablet (25 mg total) by mouth every 8 (eight) hours. (Patient taking differently: Take 25 mg by mouth daily. ), Disp: 90 tablet, Rfl: 6 .  insulin glargine (LANTUS) 100 UNIT/ML injection, Inject 0.2-0.25 mLs (20-25 Units total) into the skin 2 (two) times daily. Take 20 units  every morning and 25 units at bedtime., Disp: 10 mL, Rfl: 11 .  Insulin Syringe-Needle U-100 (INSULIN SYRINGE .5CC/31GX5/16") 31G X 5/16" 0.5 ML MISC, Use as directed, Disp: , Rfl: 4 .  Insulin Syringes, Disposable, U-100 0.5 ML MISC, Take Lantus insulin as directed    Diagnosis: Insulin dependent diabetes mellitus ICD10 code Z79.4, Disp: 100 each, Rfl: 4 .  isosorbide mononitrate (IMDUR) 30 MG 24 hr tablet, Take 1 tablet (30 mg total) by mouth daily., Disp: 30 tablet, Rfl: 6 .  ivabradine (CORLANOR) 5 MG TABS tablet, Take 1 tablet (5 mg total) by mouth 2 (two) times daily with a meal., Disp: 60 tablet, Rfl: 6 .  metFORMIN (GLUCOPHAGE) 500 MG tablet, Take 1 tablet (500 mg total) by mouth daily with breakfast., Disp: 30 tablet, Rfl: 5 .  nitroGLYCERIN (NITROSTAT) 0.4 MG SL tablet, Place 1 tablet (0.4 mg total) under the tongue every 5 (five) minutes as needed for chest pain., Disp: 25 tablet, Rfl: 3 .  spironolactone (ALDACTONE) 25 MG tablet, Take 1 tablet (25 mg total) by mouth daily., Disp: 30 tablet, Rfl: 6 .  atorvastatin (LIPITOR) 80 MG tablet, Take 1 tablet (80 mg total) by mouth daily at 6 PM., Disp: 30 tablet, Rfl: 0 .  carvedilol (COREG) 25 MG tablet, Take 1 tablet (25 mg total) by mouth 2 (two) times daily with a meal., Disp: 60 tablet, Rfl: 0 .  Multiple Vitamin (MULTIVITAMIN WITH MINERALS) TABS tablet, Take 1 tablet  by mouth daily., Disp: , Rfl:  .  pantoprazole (PROTONIX) 40 MG tablet, Take 1 tablet (40 mg total) by mouth daily., Disp: 30 tablet, Rfl: 0 .  thiamine 100 MG tablet, Take 1 tablet (100 mg total) by mouth daily., Disp: 30 tablet, Rfl: 0   Introduction:  This is a 19 channel routine scalp EEG performed at the bedside with bipolar and monopolar montages arranged in accordance to the international 10/20 system of electrode placement. One channel was dedicated to EKG recording.   Findings:  The background rhythm was normal 9-10 Hz alpha . No definite evidence of abnormal  epileptiform discharges or electrographic seizures were noted during this recording.   Impression:  Unremarkable awake and drowsy routine inpatient EEG. Clinical correlation is recommended .

## 2015-12-30 ENCOUNTER — Telehealth: Payer: Self-pay | Admitting: *Deleted

## 2015-12-30 NOTE — Telephone Encounter (Signed)
I called patient for 96-month follow-up for REDS@ Discharge Study. I spoke to patient's daughter. She stated her father is doing well. He has been re-hospitalized one time for anxiety disorder. I told her to thank her father for his participation in the study.Marland Kitchen

## 2016-01-13 ENCOUNTER — Ambulatory Visit: Payer: Medicaid Other | Admitting: Cardiology

## 2016-02-09 ENCOUNTER — Encounter: Payer: Self-pay | Admitting: Cardiology

## 2016-02-09 NOTE — Progress Notes (Deleted)
Cardiology Office Note   Date:  02/09/2016   ID:  Antonio Guerra, DOB 1951/01/10, MRN [id number]  PCP:  Benito Mccreedy, MD  Cardiologist:   Minus Breeding, MD   No chief complaint on file.     History of Present Illness: Antonio Guerra is a 65 y.o. male who presents for of CAD, ischemic cardiomyopathy, chronic systolic HF, HTN, stroke and DM, who was hospitalized in the spring for evaluation of SOB and chest pain.   He had a repeat cardiac cath and was found to have stable CAD as below.  He was seen in follow up in May and required some medication clarification.    Past Medical History:  Diagnosis Date  . Acute on chronic combined systolic (congestive) and diastolic (congestive) heart failure ([place]) 09/27/2015  . Arthritis    "all over" (12/20/2015)  . Chronic lower back pain   . Chronic systolic congestive heart failure ([place])    a. 04/2014 Echo: EF 20-25%.  . Coronary artery disease    a. 2011 MI x 2 with PCI: stent x 2 (LAD and RI) @ [facility] in [place], [place];  b. 04/2014 Cath/PCI: LM 10-20, LAD 40p, 57m 835mSR(3.5x38 Xience DES), 50apical, LCX 20 diffuse, RI 50p, 2015mR, RCA 40-7m99md.  . Fibromyalgia   . Hyperlipidemia   . Hypertension   . Hypertensive urgency 09/27/2015  . Ischemic cardiomyopathy    a. 04/2014 Echo: EF 20-25%, anteroseptal, inferior, & basal-mid inferoseptal AK. Sev apical/ant HK, Ao sclerosis, mild MR, triv PI.  . MyMarland Kitchencardial infarction (HCC)[place]15  . [facility]   "right hand weaker since" (12/20/2015)  . Type II diabetes mellitus (HCC)[place]. Walking pneumonia ~ 2014    Past Surgical History:  Procedure Laterality Date  . ANKLE FRACTURE SURGERY Right   . CARDIAC CATHETERIZATION N/A 09/30/2015   Procedure: Left Heart Cath and Coronary Angiography;  Surgeon: ChriBurnell Blanks;  [facility];  Service: Cardiovascular;  Laterality: N/A;  . CORONARY ANGIOPLASTY WITH STENT PLACEMENT     "total of 3 stents" (12/20/2015)    . FRACTIONAL FLOW RESERVE WIRE  05/11/2014   Procedure: FRACTIONAL FLOW RESERVE WIRE;  Surgeon: ChriBurnell Blanks;  [facility];  Service: Cardiovascular;;  . FRACTURE SURGERY    . LEFT HEART CATHETERIZATION WITH CORONARY/GRAFT ANGIOGRAM N/A 05/11/2014   Procedure: LEFT HEART CATHETERIZATION WITH COROBeatrix Fettersurgeon: ChriBurnell Blanks;  [facility];  Service: Cardiovascular;  Laterality: N/A;  . PERCUTANEOUS CORONARY STENT INTERVENTION (PCI-S)  05/11/2014   Procedure: PERCUTANEOUS CORONARY STENT INTERVENTION (PCI-S);  Surgeon: ChriBurnell Blanks;  [facility];  Service: Cardiovascular;;     Current Outpatient Prescriptions  Medication Sig Dispense Refill  . ACCU-CHEK AVIVA PLUS test strip Use as directed  0  . ACCU-CHEK SOFTCLIX LANCETS lancets Use as directed  0  . aspirin EC 81 MG tablet Take 81 mg by mouth daily.    . atMarland Kitchenrvastatin (LIPITOR) 80 MG tablet Take 1 tablet (80 mg total) by mouth daily at 6 PM. 30 tablet 0  . blood glucose meter kit and supplies Dispense based on patient and insurance preference. Use up to four times daily as directed. (FOR ICD-9 250.00, 250.01). 1 each 0  . carvedilol (COREG) 25 MG tablet Take 1 tablet (25 mg total) by mouth 2 (two) times daily with a meal. 60 tablet 0  . folic acid (FOLVITE) 1 MG tablet  Take 1 mg by mouth daily.    . furosemide (LASIX) 40 MG tablet Take 1 tablet (40 mg total) by mouth 2 (two) times daily. 60 tablet 3  . hydrALAZINE (APRESOLINE) 25 MG tablet Take 1 tablet (25 mg total) by mouth every 8 (eight) hours. (Patient taking differently: Take 25 mg by mouth daily. ) 90 tablet 6  . insulin glargine (LANTUS) 100 UNIT/ML injection Inject 0.2-0.25 mLs (20-25 Units total) into the skin 2 (two) times daily. Take 20 units every morning and 25 units at bedtime. 10 mL 11  . Insulin Syringe-Needle U-100 (INSULIN SYRINGE .5CC/31GX5/16") 31G X 5/16" 0.5 ML MISC Use as directed  4   . Insulin Syringes, Disposable, U-100 0.5 ML MISC Take Lantus insulin as directed    Diagnosis: Insulin dependent diabetes mellitus ICD10 code Z79.4 100 each 4  . isosorbide mononitrate (IMDUR) 30 MG 24 hr tablet Take 1 tablet (30 mg total) by mouth daily. 30 tablet 6  . ivabradine (CORLANOR) 5 MG TABS tablet Take 1 tablet (5 mg total) by mouth 2 (two) times daily with a meal. 60 tablet 6  . metFORMIN (GLUCOPHAGE) 500 MG tablet Take 1 tablet (500 mg total) by mouth daily with breakfast. 30 tablet 5  . Multiple Vitamin (MULTIVITAMIN WITH MINERALS) TABS tablet Take 1 tablet by mouth daily.    . nitroGLYCERIN (NITROSTAT) 0.4 MG SL tablet Place 1 tablet (0.4 mg total) under the tongue every 5 (five) minutes as needed for chest pain. 25 tablet 3  . pantoprazole (PROTONIX) 40 MG tablet Take 1 tablet (40 mg total) by mouth daily. 30 tablet 0  . spironolactone (ALDACTONE) 25 MG tablet Take 1 tablet (25 mg total) by mouth daily. 30 tablet 6  . thiamine 100 MG tablet Take 1 tablet (100 mg total) by mouth daily. 30 tablet 0   No current facility-administered medications for this visit.     Allergies:   Tape    ROS:  Please see the history of present illness.   Otherwise, review of systems are positive for {NONE DEFAULTED:18576::"none"}.   All other systems are reviewed and negative.    PHYSICAL EXAM: VS:  There were no vitals taken for this visit. , BMI There is no height or weight on file to calculate BMI. GENERAL:  Well appearing NECK:  No jugular venous distention, waveform within normal limits, carotid upstroke brisk and symmetric, no bruits, no thyromegaly LUNGS:  Clear to auscultation bilaterally BACK:  No CVA tenderness CHEST:  Unremarkable HEART:  PMI not displaced or sustained,S1 and S2 within normal limits, no S3, no S4, no clicks, no rubs, *** murmurs ABD:  Flat, positive bowel sounds normal in frequency in pitch, no bruits, no rebound, no guarding, no midline pulsatile mass, no  hepatomegaly, no splenomegaly EXT:  2 plus pulses throughout, no edema, no cyanosis no clubbing    EKG:  EKG {ACTION; IS/IS [id number]} ordered today. The ekg ordered today demonstrates ***   Recent Labs: 09/27/2015: TSH 4.454 12/18/2015: B Natriuretic Peptide 169.8 12/19/2015: ALT 18 12/21/2015: BUN 15; Creatinine, Ser 1.23; Hemoglobin 12.4; Platelets 235; Potassium 4.3; Sodium 136    Lipid Panel    Component Value Date/Time   CHOL 238 (H) 09/28/2015 0525   TRIG 137 09/28/2015 0525   HDL 53 09/28/2015 0525   CHOLHDL 4.5 09/28/2015 0525   VLDL 27 09/28/2015 0525   LDLCALC 158 (H) 09/28/2015 0525      Wt Readings from Last 3 Encounters:  12/21/15 192 lb 14.4  oz (87.5 kg)  11/25/15 195 lb 6.4 oz (88.6 kg)  10/02/15 192 lb 9.6 oz (87.4 kg)      Other studies Reviewed: Additional studies/ records that were reviewed today include:    Hospital records.   Review of the above records demonstrates:  Please see elsewhere in the note.     ASSESSMENT AND PLAN:  CAD:  This was stable on recent cath with patent stents. Continue medical management. ASA, BB, statin, Imdur.   Ischemic Cardiomyopathy/chronic Systolic HF:    EF 20-03%. Stable cath. Euvolemic on physical exam. Continue medical therapy. He has been taking meds incorrectly. He has only been taking Coreg, Corlanor and hydralazine once daily. We reviewed his meds together with his daughter. Patient was sent home with new medication list. Will keep Corlanor dose at 5 mg since he has only been taking 1x/day. Continue daily weights. Low sodium diet recommended.   HTN: decently controlled in the 794C systolic, despite the fact that he has been taking his meds incorrectly.   HLD: on statin therapy. LDL recently checked and was not at goal. Statin recently increased. It has been 6 weeks since his last assessment . Will plan for repeat FLP and HFTs.   DM: per PCP.   Medication Noncompliance: Patient was having difficulty with  his medication instructions, particularly his BID and TID meds. We reviewed his medication list today with his daughter. Cardiac meds were highlighted with recommended doses. Patient verbalized understanding. Reheck HR, BP and med compliance in 2 weeks with office pharmacist.   Current medicines are reviewed at length with the patient today.  The patient {ACTIONS; HAS/DOES NOT HAVE:19233} concerns regarding medicines.  The following changes have been made:  {PLAN; NO CHANGE:13088:s}  Labs/ tests ordered today include: *** No orders of the defined types were placed in this encounter.    Disposition:   FU with ***    Signed, Minus Breeding, MD  02/09/2016 12:41 PM    [facility]

## 2016-02-10 ENCOUNTER — Ambulatory Visit: Payer: Medicaid Other | Admitting: Cardiology

## 2016-02-11 ENCOUNTER — Encounter: Payer: Self-pay | Admitting: *Deleted

## 2016-03-16 ENCOUNTER — Emergency Department (HOSPITAL_COMMUNITY): Payer: Medicaid Other

## 2016-03-16 ENCOUNTER — Emergency Department (HOSPITAL_COMMUNITY)
Admission: EM | Admit: 2016-03-16 | Discharge: 2016-03-17 | Disposition: A | Discharge status: Home / Self Care | Payer: Medicaid Other | Attending: Emergency Medicine | Admitting: Emergency Medicine

## 2016-03-16 DIAGNOSIS — Z794 Long term (current) use of insulin: Secondary | ICD-10-CM | POA: Insufficient documentation

## 2016-03-16 DIAGNOSIS — I5043 Acute on chronic combined systolic (congestive) and diastolic (congestive) heart failure: Secondary | ICD-10-CM | POA: Insufficient documentation

## 2016-03-16 DIAGNOSIS — R1013 Epigastric pain: Secondary | ICD-10-CM | POA: Diagnosis present

## 2016-03-16 DIAGNOSIS — I252 Old myocardial infarction: Secondary | ICD-10-CM | POA: Insufficient documentation

## 2016-03-16 DIAGNOSIS — Z7984 Long term (current) use of oral hypoglycemic drugs: Secondary | ICD-10-CM | POA: Diagnosis not present

## 2016-03-16 DIAGNOSIS — Z955 Presence of coronary angioplasty implant and graft: Secondary | ICD-10-CM | POA: Diagnosis not present

## 2016-03-16 DIAGNOSIS — Z7982 Long term (current) use of aspirin: Secondary | ICD-10-CM | POA: Insufficient documentation

## 2016-03-16 DIAGNOSIS — Z87891 Personal history of nicotine dependence: Secondary | ICD-10-CM | POA: Diagnosis not present

## 2016-03-16 DIAGNOSIS — Z79899 Other long term (current) drug therapy: Secondary | ICD-10-CM | POA: Diagnosis not present

## 2016-03-16 DIAGNOSIS — I110 Hypertensive heart disease with heart failure: Secondary | ICD-10-CM | POA: Insufficient documentation

## 2016-03-16 DIAGNOSIS — I2510 Atherosclerotic heart disease of native coronary artery without angina pectoris: Secondary | ICD-10-CM | POA: Diagnosis not present

## 2016-03-16 DIAGNOSIS — E119 Type 2 diabetes mellitus without complications: Secondary | ICD-10-CM | POA: Diagnosis not present

## 2016-03-16 DIAGNOSIS — Z8673 Personal history of transient ischemic attack (TIA), and cerebral infarction without residual deficits: Secondary | ICD-10-CM | POA: Diagnosis not present

## 2016-03-16 LAB — URINALYSIS, ROUTINE W REFLEX MICROSCOPIC
Glucose, UA: >1000 mg/dL — AB
Hgb urine dipstick: NEGATIVE
Ketones, ur: NEGATIVE mg/dL
Leukocytes, UA: NEGATIVE
NITRITE: NEGATIVE
Protein, ur: 30 mg/dL — AB
SPECIFIC GRAVITY, URINE: 1.026 (ref 1.005–1.030)
pH: 5.5 (ref 5.0–8.0)

## 2016-03-16 LAB — COMPREHENSIVE METABOLIC PANEL
ALK PHOS: 75 U/L (ref 38–126)
ALT: 18 U/L (ref 17–63)
ANION GAP: 13 (ref 5–15)
AST: 24 U/L (ref 15–41)
Albumin: 3.9 g/dL (ref 3.5–5.0)
BUN: 9 mg/dL (ref 6–20)
CO2: 19 mmol/L — AB (ref 22–32)
CREATININE: 1.45 mg/dL — AB (ref 0.61–1.24)
Calcium: 9.5 mg/dL (ref 8.9–10.3)
Chloride: 106 mmol/L (ref 101–111)
GFR, EST AFRICAN AMERICAN: 58 mL/min — AB (ref >60)
GFR, EST NON AFRICAN AMERICAN: 50 mL/min — AB (ref >60)
Glucose, Bld: 180 mg/dL — ABNORMAL HIGH (ref 65–99)
Potassium: 3.7 mmol/L (ref 3.5–5.1)
SODIUM: 138 mmol/L (ref 135–145)
Total Bilirubin: 0.7 mg/dL (ref 0.3–1.2)
Total Protein: 7 g/dL (ref 6.5–8.1)

## 2016-03-16 LAB — URINE MICROSCOPIC-ADD ON

## 2016-03-16 LAB — CBC
HCT: 40.3 % (ref 39.0–52.0)
HEMOGLOBIN: 13.8 g/dL (ref 13.0–17.0)
MCH: 27.6 pg (ref 26.0–34.0)
MCHC: 34.2 g/dL (ref 30.0–36.0)
MCV: 80.6 fL (ref 78.0–100.0)
Platelets: 237 10*3/uL (ref 150–400)
RBC: 5 MIL/uL (ref 4.22–5.81)
RDW: 12.8 % (ref 11.5–15.5)
WBC: 4.6 10*3/uL (ref 4.0–10.5)

## 2016-03-16 LAB — LIPASE, BLOOD: Lipase: 27 U/L (ref 11–51)

## 2016-03-16 LAB — I-STAT TROPONIN, ED: TROPONIN I, POC: 0 ng/mL (ref 0.00–0.08)

## 2016-03-16 LAB — ETHANOL

## 2016-03-16 MED ORDER — SODIUM CHLORIDE 0.9 % IV BOLUS (SEPSIS)
500.0000 mL | Freq: Once | INTRAVENOUS | Status: AC
  Administered 2016-03-16: 500 mL via INTRAVENOUS

## 2016-03-16 MED ORDER — ONDANSETRON HCL 4 MG/2ML IJ SOLN
4.0000 mg | Freq: Once | INTRAMUSCULAR | Status: AC
  Administered 2016-03-16: 4 mg via INTRAVENOUS
  Filled 2016-03-16: qty 2

## 2016-03-16 MED ORDER — SUCRALFATE 1 G PO TABS: 1.0000 g | ORAL_TABLET | Freq: Three times a day (TID) | ORAL | 0 refills | Status: DC

## 2016-03-16 MED ORDER — HYDROMORPHONE HCL 1 MG/ML IJ SOLN
1.0000 mg | Freq: Once | INTRAMUSCULAR | Status: AC
  Administered 2016-03-16: 1 mg via INTRAVENOUS
  Filled 2016-03-16: qty 1

## 2016-03-16 NOTE — Discharge Instructions (Signed)
Follow-up with your primary care doctor start taking the Carafate in addition to her other medications

## 2016-03-16 NOTE — ED Notes (Signed)
Pt. given urinal / advised to urinate for his pending urinalysis.

## 2016-03-16 NOTE — ED Triage Notes (Addendum)
Per EMS - pt reports gastritis, left-sided abd pain tender to palpation x 3 days. C/o chest pain starting yesterday, positional. Hx MI, 3 stents placed. Pt c/o nausea. Denies vomiting. Given 324mg  aspirin. Inverted T waves and some depression in lateral leads. Hx stroke w/ right sided weakness.

## 2016-03-16 NOTE — ED Provider Notes (Signed)
[facility] DEPT Provider Note   CSN: [id number] Arrival date & time: 03/16/16  1757     History   Chief Complaint Chief Complaint  Patient presents with  . Chest Pain  . Abdominal Pain    HPI Antonio Guerra is a 65 y.o. male.  Pt started having pain yesterday.  Today the sx were worse.  He felt week.  He was diagnosed last month with gastritis last month when he had similar sx   The history is provided by the patient.  Abdominal Pain   This is a new problem. The current episode started yesterday. The problem occurs constantly. The problem has been gradually worsening. Associated with: movement. The pain is located in the LUQ and epigastric region. The pain is moderate. Associated symptoms include anorexia. Pertinent negatives include fever, diarrhea, nausea and dysuria. Associated symptoms comments: Chilled . The symptoms are aggravated by certain positions and eating. Nothing relieves the symptoms. Past medical history comments: gastritis.    Past Medical History:  Diagnosis Date  . Arthritis    "all over" (12/20/2015)  . Chronic lower back pain   . Chronic systolic congestive heart failure ([place])    a. 04/2014 Echo: EF 20-25%.  . Coronary artery disease    a. 2011 MI x 2 with PCI: stent x 2 (LAD and RI) @ [facility] in [place], [place];  b. 04/2014 Cath/PCI: LM 10-20, LAD 40p, 81m 857mSR(3.5x38 Xience DES), 50apical, LCX 20 diffuse, RI 50p, 2088mR, RCA 40-68m30md.  . Fibromyalgia   . Hyperlipidemia   . Hypertension   . Hypertensive urgency 09/27/2015  . Myocardial infarction (HCC)[place]15  . [facility]   "right hand weaker since" (12/20/2015)  . [facility] ([facility]  Patient Active Problem List   Diagnosis Date Noted  . Altered mental status   . 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 Alcohol intoxication (HCC)[place]/07/2015  . 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 Epigastric pain   . Non compliance w  medication regimen   . Elevated troponin   . Type 2 diabetes mellitus (HCC)[place]/06/2016  . Hypertensive urgency 09/27/2015  . Acute on chronic combined systolic (congestive) and diastolic (congestive) heart failure (HCC)[place]/04/2016  . Ischemic cardiomyopathy   . Hyperlipidemia   . Coronary artery disease   . Chronic systolic congestive heart failure (HCC)[place]. Unstable angina (HCC)[place]/23/2015  . Stroke (HCC)[place]. Hypertension   . Diabetes mellitus without complication (HCC)[place]. Chest pain 05/09/2014    Past Surgical History:  Procedure Laterality Date  . ANKLE FRACTURE SURGERY Right   . CARDIAC CATHETERIZATION N/A 09/30/2015   Procedure: Left Heart Cath and Coronary Angiography;  Surgeon: ChriBurnell Blanks;  [facility];  Service: Cardiovascular;  Laterality: N/A;  . CORONARY ANGIOPLASTY WITH STENT PLACEMENT     "total of 3 stents" (12/20/2015)  . FRACTIONAL FLOW RESERVE WIRE  05/11/2014   Procedure: FRACTIONAL FLOW RESERVE WIRE;  Surgeon: ChriBurnell Blanks;  [facility];  Service: Cardiovascular;;  . FRACTURE SURGERY    . LEFT HEART CATHETERIZATION WITH CORONARY/GRAFT ANGIOGRAM N/A 05/11/2014   Procedure: LEFT HEART CATHETERIZATION WITH COROBeatrix Fettersurgeon: ChriBurnell Blanks;  [facility];  Service: Cardiovascular;  Laterality: N/A;  . PERCUTANEOUS CORONARY STENT INTERVENTION (PCI-S)  05/11/2014   Procedure: PERCUTANEOUS CORONARY STENT INTERVENTION (PCI-S);  Surgeon: ChriAnnita Brod  Angelena Form, MD;  Location: [facility] CATH LAB;  Service: Cardiovascular;;       Home Medications    Prior to Admission medications   Medication Sig Start Date End Date Taking? Authorizing Provider  aspirin EC 81 MG tablet Take 81 mg by mouth daily.   Yes Historical Provider, MD  atorvastatin (LIPITOR) 80 MG tablet Take 1 tablet (80 mg total) by mouth daily at 6 PM. 12/21/15  Yes Modena Jansky, MD  carvedilol (COREG) 25 MG tablet Take  1 tablet (25 mg total) by mouth 2 (two) times daily with a meal. 12/21/15  Yes Modena Jansky, MD  folic acid (FOLVITE) 1 MG tablet Take 1 mg by mouth daily.   Yes Historical Provider, MD  furosemide (LASIX) 40 MG tablet Take 1 tablet (40 mg total) by mouth 2 (two) times daily. 10/02/15  Yes Bhavinkumar Bhagat, PA  hydrALAZINE (APRESOLINE) 25 MG tablet Take 1 tablet (25 mg total) by mouth every 8 (eight) hours. Patient taking differently: Take 25 mg by mouth daily.  10/02/15  Yes Bhavinkumar Bhagat, PA  insulin glargine (LANTUS) 100 UNIT/ML injection Inject 0.2-0.25 mLs (20-25 Units total) into the skin 2 (two) times daily. Take 20 units every morning and 25 units at bedtime. 10/02/15  Yes Ripudeep Krystal Eaton, MD  isosorbide mononitrate (IMDUR) 30 MG 24 hr tablet Take 1 tablet (30 mg total) by mouth daily. 10/02/15  Yes Bhavinkumar Bhagat, PA  ivabradine (CORLANOR) 5 MG TABS tablet Take 1 tablet (5 mg total) by mouth 2 (two) times daily with a meal. 10/02/15  Yes Bhavinkumar Bhagat, PA  metFORMIN (GLUCOPHAGE) 500 MG tablet Take 1 tablet (500 mg total) by mouth daily with breakfast. 10/02/15  Yes Ripudeep Krystal Eaton, MD  Multiple Vitamin (MULTIVITAMIN WITH MINERALS) TABS tablet Take 1 tablet by mouth daily. 12/21/15  Yes Modena Jansky, MD  nitroGLYCERIN (NITROSTAT) 0.4 MG SL tablet Place 1 tablet (0.4 mg total) under the tongue every 5 (five) minutes as needed for chest pain. 05/12/14  Yes Brett Canales, PA-C  pantoprazole (PROTONIX) 40 MG tablet Take 1 tablet (40 mg total) by mouth daily. 12/21/15  Yes Modena Jansky, MD  spironolactone (ALDACTONE) 25 MG tablet Take 1 tablet (25 mg total) by mouth daily. 10/02/15  Yes Bhavinkumar Bhagat, PA  thiamine 100 MG tablet Take 1 tablet (100 mg total) by mouth daily. 12/21/15  Yes Modena Jansky, MD  ACCU-CHEK AVIVA PLUS test strip Use as directed 10/02/15   Historical Provider, MD  ACCU-CHEK SOFTCLIX LANCETS lancets Use as directed 10/02/15   Historical Provider, MD  blood  glucose meter kit and supplies Dispense based on patient and insurance preference. Use up to four times daily as directed. (FOR ICD-9 250.00, 250.01). 10/02/15   Ripudeep Krystal Eaton, MD  Insulin Syringe-Needle U-100 (INSULIN SYRINGE .5CC/31GX5/16") 31G X 5/16" 0.5 ML MISC Use as directed 10/02/15   Historical Provider, MD  Insulin Syringes, Disposable, U-100 0.5 ML MISC Take Lantus insulin as directed    Diagnosis: Insulin dependent diabetes mellitus ICD10 code Z79.4 10/02/15   Ripudeep Krystal Eaton, MD  sucralfate (CARAFATE) 1 g tablet Take 1 tablet (1 g total) by mouth 4 (four) times daily -  with meals and at bedtime. 03/16/16   Dorie Rank, MD    Family History Family History  Problem Relation Age of Onset  . Diabetes Mother   . CAD Neg Hx     Social History Social History  Substance Use Topics  . Smoking  status: Former Research scientist (life sciences)  . Smokeless tobacco: Never Used     Comment: 12/20/2015 "smoked cigarettes 30-78yrago"  . Alcohol use 3.0 oz/week    5 Cans of beer per week     Allergies   Tape   Review of Systems Review of Systems  Constitutional: Negative for fever.  Respiratory: Negative for shortness of breath.   Cardiovascular: Positive for chest pain.  Gastrointestinal: Positive for abdominal pain and anorexia. Negative for diarrhea and nausea.  Genitourinary: Negative for dysuria.  All other systems reviewed and are negative.    Physical Exam Updated Vital Signs BP 148/96 (BP Location: Right Arm)   Pulse 94   Temp 98 F (36.7 C) (Oral)   Resp 16   Ht '6\' 1"'$  (1.854 m)   Wt 89.4 kg   SpO2 99%   BMI 25.99 kg/m   Physical Exam  Constitutional: He appears well-developed and well-nourished. No distress.  HENT:  Head: Normocephalic and atraumatic.  Right Ear: External ear normal.  Left Ear: External ear normal.  Eyes: Conjunctivae are normal. Right eye exhibits no discharge. Left eye exhibits no discharge. No scleral icterus.  Neck: Neck supple. No tracheal deviation present.    Cardiovascular: Normal rate, regular rhythm and intact distal pulses.   Pulmonary/Chest: Effort normal and breath sounds normal. No stridor. No respiratory distress. He has no wheezes. He has no rales.  Abdominal: Soft. Bowel sounds are normal. He exhibits no distension. There is tenderness in the epigastric area. There is guarding. There is no rigidity and no rebound.  Musculoskeletal: He exhibits no edema or tenderness.  Neurological: He is alert. He has normal strength. No cranial nerve deficit (no facial droop, extraocular movements intact, no slurred speech) or sensory deficit. He exhibits normal muscle tone. He displays no seizure activity. Coordination normal.  Skin: Skin is warm and dry. No rash noted.  Psychiatric: He has a normal mood and affect.  Nursing note and vitals reviewed.    ED Treatments / Results  Labs (all labs ordered are listed, but only abnormal results are displayed) Labs Reviewed  COMPREHENSIVE METABOLIC PANEL - Abnormal; Notable for the following:       Result Value   CO2 19 (*)    Glucose, Bld 180 (*)    Creatinine, Ser 1.45 (*)    GFR calc non Af Amer 50 (*)    GFR calc Af Amer 58 (*)    All other components within normal limits  URINALYSIS, ROUTINE W REFLEX [facility]CROSCOPIC (NOT [facility] - Abnormal; Notable for the following:    Color, Urine AMBER (*)    APPearance HAZY (*)    Glucose, UA >1000 (*)    Bilirubin Urine SMALL (*)    Protein, ur 30 (*)    All other components within normal limits  URINE MICROSCOPIC-ADD ON - Abnormal; Notable for the following:    Squamous Epithelial / LPF 0-5 (*)    Bacteria, UA RARE (*)    Casts HYALINE CASTS (*)    All other components within normal limits  CBC  LIPASE, BLOOD  ETHANOL  I-STAT TROPOININ, ED    EKG  EKG Interpretation  Date/Time:  Monday March 16 2016 18:02:00 EDT Ventricular Rate:  82 PR Interval:    QRS Duration: 112 QT Interval:  441 QTC Calculation: 516 R Axis:   -79 Text  Interpretation:  Sinus rhythm Left anterior fascicular block LVH with secondary repolarization abnormality Anterior Q waves, possibly due to LVH Prolonged QT interval increased  inferolateral ischemic changes compared to prior ECG Confirmed by Cherity Blickenstaff  MD-J, Briena Swingler [phone]) on 03/16/2016 6:20:03 PM       Radiology Dg Chest Portable 1 View  Result Date: 03/16/2016 CLINICAL DATA:  Chest pain EXAM: PORTABLE CHEST 1 VIEW COMPARISON:  Dec 18, 2015 FINDINGS: No edema or consolidation. Heart size and pulmonary vascularity are normal. No adenopathy. There is degenerative change in each shoulder. No pneumothorax. IMPRESSION: No edema or consolidation. Electronically Signed   By: Lowella Grip III M.D.   On: 03/16/2016 19:04    Procedures Procedures (including critical care time)  Medications Ordered in ED Medications  HYDROmorphone (DILAUDID) injection 1 mg (1 mg Intravenous Given 03/16/16 1900)  ondansetron (ZOFRAN) injection 4 mg (4 mg Intravenous Given 03/16/16 1900)  sodium chloride 0.9 % bolus 500 mL (0 mLs Intravenous Stopped 03/16/16 2232)     Initial Impression / Assessment and Plan / ED Course  I have reviewed the triage vital signs and the nursing notes.  Pertinent labs & imaging results that were available during my care of the patient were reviewed by me and considered in my medical decision making (see chart for details).  Clinical Course    Labs , EKG , xrays are reassuring.  Suspect sx related to gastritis.  Dc home with carafate.  Follow up with PCP  Final Clinical Impressions(s) / ED Diagnoses   Final diagnoses:  Epigastric pain    New Prescriptions Discharge Medication List as of 03/16/2016 11:37 PM    START taking these medications   Details  sucralfate (CARAFATE) 1 g tablet Take 1 tablet (1 g total) by mouth 4 (four) times daily -  with meals and at bedtime., Starting Mon 03/16/2016, Print         Dorie Rank, MD 03/17/16 [phone]

## 2016-03-16 NOTE — ED Notes (Signed)
EDP explained tests results and discharge plan to pt.

## 2016-03-17 ENCOUNTER — Encounter (HOSPITAL_COMMUNITY): Payer: Self-pay | Admitting: Emergency Medicine

## 2016-07-29 ENCOUNTER — Encounter (HOSPITAL_COMMUNITY): Payer: Self-pay | Admitting: *Deleted

## 2016-07-29 ENCOUNTER — Emergency Department (HOSPITAL_COMMUNITY)
Admission: EM | Admit: 2016-07-29 | Discharge: 2016-07-29 | Disposition: A | Discharge status: Home / Self Care | Payer: Medicaid Other | Attending: Emergency Medicine | Admitting: Emergency Medicine

## 2016-07-29 DIAGNOSIS — I5043 Acute on chronic combined systolic (congestive) and diastolic (congestive) heart failure: Secondary | ICD-10-CM | POA: Diagnosis not present

## 2016-07-29 DIAGNOSIS — I110 Hypertensive heart disease with heart failure: Secondary | ICD-10-CM | POA: Diagnosis not present

## 2016-07-29 DIAGNOSIS — I252 Old myocardial infarction: Secondary | ICD-10-CM | POA: Diagnosis not present

## 2016-07-29 DIAGNOSIS — K1379 Other lesions of oral mucosa: Secondary | ICD-10-CM | POA: Insufficient documentation

## 2016-07-29 DIAGNOSIS — Z7982 Long term (current) use of aspirin: Secondary | ICD-10-CM | POA: Insufficient documentation

## 2016-07-29 DIAGNOSIS — Z794 Long term (current) use of insulin: Secondary | ICD-10-CM | POA: Insufficient documentation

## 2016-07-29 DIAGNOSIS — Z87891 Personal history of nicotine dependence: Secondary | ICD-10-CM | POA: Diagnosis not present

## 2016-07-29 DIAGNOSIS — Z955 Presence of coronary angioplasty implant and graft: Secondary | ICD-10-CM | POA: Insufficient documentation

## 2016-07-29 DIAGNOSIS — Z8673 Personal history of transient ischemic attack (TIA), and cerebral infarction without residual deficits: Secondary | ICD-10-CM | POA: Diagnosis not present

## 2016-07-29 DIAGNOSIS — I2510 Atherosclerotic heart disease of native coronary artery without angina pectoris: Secondary | ICD-10-CM | POA: Insufficient documentation

## 2016-07-29 DIAGNOSIS — J029 Acute pharyngitis, unspecified: Secondary | ICD-10-CM | POA: Insufficient documentation

## 2016-07-29 DIAGNOSIS — R220 Localized swelling, mass and lump, head: Secondary | ICD-10-CM | POA: Diagnosis present

## 2016-07-29 DIAGNOSIS — E119 Type 2 diabetes mellitus without complications: Secondary | ICD-10-CM | POA: Insufficient documentation

## 2016-07-29 DIAGNOSIS — Z79899 Other long term (current) drug therapy: Secondary | ICD-10-CM | POA: Insufficient documentation

## 2016-07-29 LAB — RAPID STREP SCREEN (MED CTR MEBANE ONLY): Streptococcus, Group A Screen (Direct): NEGATIVE

## 2016-07-29 LAB — CBG MONITORING, ED: Glucose-Capillary: 97 mg/dL (ref 65–99)

## 2016-07-29 MED ORDER — DEXAMETHASONE 4 MG PO TABS
6.0000 mg | ORAL_TABLET | Freq: Once | ORAL | Status: AC
  Administered 2016-07-29: 6 mg via ORAL
  Filled 2016-07-29: qty 2

## 2016-07-29 NOTE — ED Triage Notes (Signed)
Pt feels like there is a piece of skin hanging in back of throat, causing him to gag. No resp distress is noted, airway intact.

## 2016-07-29 NOTE — Discharge Instructions (Signed)
Your strep test is negative You have been give a long acting steroid that will reduce the swelling

## 2016-07-29 NOTE — ED Provider Notes (Signed)
[facility] DEPT Provider Note   CSN: [id number] Arrival date & time: 07/29/16  1418     History   Chief Complaint Chief Complaint  Patient presents with  . Oral Swelling    HPI Antonio Guerra is a 66 y.o. male.  This is a 66 year old male who reports sore throat starting today and now feel as though he is "swallowing a piece of skin in the back of his throat" Denies fever, chills but does state that he is developing rhinitis      Past Medical History:  Diagnosis Date  . Arthritis    "all over" (12/20/2015)  . Chronic lower back pain   . Chronic systolic congestive heart failure ([place])    a. 04/2014 Echo: EF 20-25%.  . Coronary artery disease    a. 2011 MI x 2 with PCI: stent x 2 (LAD and RI) @ [facility] in [place], [place];  b. 04/2014 Cath/PCI: LM 10-20, LAD 40p, 13m 859mSR(3.5x38 Xience DES), 50apical, LCX 20 diffuse, RI 50p, 2027mR, RCA 40-30m47md.  . Fibromyalgia   . Hyperlipidemia   . Hypertension   . Hypertensive urgency 09/27/2015  . Myocardial infarction 2015  . [facility]   "right hand weaker since" (12/20/2015)  . [facility]  Patient Active Problem List   Diagnosis Date Noted  . Altered mental status   . 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 Alcohol intoxication (HCC)[place]/07/2015  . 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 Epigastric pain   . Non compliance w medication regimen   . Elevated troponin   . Type 2 diabetes mellitus (HCC)[place]/06/2016  . Hypertensive urgency 09/27/2015  . Acute on chronic combined systolic (congestive) and diastolic (congestive) heart failure 09/27/2015  . Ischemic cardiomyopathy   . Hyperlipidemia   . Coronary artery disease   . Chronic systolic congestive heart failure (HCC)[place]. Unstable angina (HCC)[place]/23/2015  . Stroke (HCC)[place]. Uncontrolled hypertension   . Diabetes mellitus without complication (HCC)[place]. Precordial chest pain 05/09/2014      Past Surgical History:  Procedure Laterality Date  . ANKLE FRACTURE SURGERY Right   . CARDIAC CATHETERIZATION N/A 09/30/2015   Procedure: Left Heart Cath and Coronary Angiography;  Surgeon: ChriBurnell Blanks;  [facility];  Service: Cardiovascular;  Laterality: N/A;  . CORONARY ANGIOPLASTY WITH STENT PLACEMENT     "total of 3 stents" (12/20/2015)  . FRACTIONAL FLOW RESERVE WIRE  05/11/2014   Procedure: FRACTIONAL FLOW RESERVE WIRE;  Surgeon: ChriBurnell Blanks;  [facility];  Service: Cardiovascular;;  . FRACTURE SURGERY    . LEFT HEART CATHETERIZATION WITH CORONARY/GRAFT ANGIOGRAM N/A 05/11/2014   Procedure: LEFT HEART CATHETERIZATION WITH COROBeatrix Fettersurgeon: ChriBurnell Blanks;  [facility];  Service: Cardiovascular;  Laterality: N/A;  . PERCUTANEOUS CORONARY STENT INTERVENTION (PCI-S)  05/11/2014   Procedure: PERCUTANEOUS CORONARY STENT INTERVENTION (PCI-S);  Surgeon: ChriBurnell Blanks;  [facility];  Service: Cardiovascular;;       Home Medications    Prior to Admission medications   Medication Sig Start Date End Date Taking? Authorizing Provider  aspirin EC 81 MG tablet Take 81 mg by mouth daily.   Yes Historical Provider, MD  atorvastatin (LIPITOR) 80 MG tablet Take 1 tablet (80 mg total) by mouth daily at 6 PM. 12/21/15  Yes  Modena Jansky, MD  carvedilol (COREG) 25 MG tablet Take 1 tablet (25 mg total) by mouth 2 (two) times daily with a meal. 12/21/15  Yes Modena Jansky, MD  Cholecalciferol (VITAMIN D PO) Take 1 tablet by mouth daily.   Yes Historical Provider, MD  folic acid (FOLVITE) 1 MG tablet Take 1 mg by mouth daily.   Yes Historical Provider, MD  furosemide (LASIX) 40 MG tablet Take 1 tablet (40 mg total) by mouth 2 (two) times daily. 10/02/15  Yes Bhavinkumar Bhagat, PA  hydrALAZINE (APRESOLINE) 25 MG tablet Take 1 tablet (25 mg total) by mouth every 8 (eight)  hours. Patient taking differently: Take 25 mg by mouth daily.  10/02/15  Yes Bhavinkumar Bhagat, PA  insulin glargine (LANTUS) 100 UNIT/ML injection Inject 0.2-0.25 mLs (20-25 Units total) into the skin 2 (two) times daily. Take 20 units every morning and 25 units at bedtime. 10/02/15  Yes Ripudeep Krystal Eaton, MD  isosorbide mononitrate (IMDUR) 30 MG 24 hr tablet Take 1 tablet (30 mg total) by mouth daily. 10/02/15  Yes Bhavinkumar Bhagat, PA  ivabradine (CORLANOR) 5 MG TABS tablet Take 1 tablet (5 mg total) by mouth 2 (two) times daily with a meal. 10/02/15  Yes Bhavinkumar Bhagat, PA  metFORMIN (GLUCOPHAGE) 500 MG tablet Take 1 tablet (500 mg total) by mouth daily with breakfast. 10/02/15  Yes Ripudeep K Rai, MD  nitroGLYCERIN (NITROSTAT) 0.4 MG SL tablet Place 1 tablet (0.4 mg total) under the tongue every 5 (five) minutes as needed for chest pain. 05/12/14  Yes Brett Canales, PA-C  pantoprazole (PROTONIX) 40 MG tablet Take 1 tablet (40 mg total) by mouth daily. Patient taking differently: Take 40 mg by mouth daily as needed (HEARTBURN).  12/21/15  Yes Modena Jansky, MD  spironolactone (ALDACTONE) 25 MG tablet Take 1 tablet (25 mg total) by mouth daily. 10/02/15  Yes Bhavinkumar Bhagat, PA  sucralfate (CARAFATE) 1 g tablet Take 1 tablet (1 g total) by mouth 4 (four) times daily -  with meals and at bedtime. Patient taking differently: Take 1 g by mouth daily.  03/16/16  Yes Dorie Rank, MD  blood glucose meter kit and supplies Dispense based on patient and insurance preference. Use up to four times daily as directed. (FOR ICD-9 250.00, 250.01). 10/02/15   Ripudeep Krystal Eaton, MD    Family History Family History  Problem Relation Age of Onset  . Diabetes Mother   . CAD Neg Hx     Social History Social History  Substance Use Topics  . Smoking status: Former Research scientist (life sciences)  . Smokeless tobacco: Never Used     Comment: 12/20/2015 "smoked cigarettes 30-49yrago"  . Alcohol use 3.0 oz/week    5 Cans of beer per week      Allergies   Tape   Review of Systems Review of Systems  Constitutional: Negative for chills and fever.  HENT: Positive for rhinorrhea and sore throat. Negative for trouble swallowing.   Respiratory: Negative for cough and shortness of breath.   Cardiovascular: Negative for chest pain.  Gastrointestinal: Negative for abdominal pain.  Musculoskeletal: Negative for myalgias.  Skin: Negative for rash.  Neurological: Negative for headaches.  All other systems reviewed and are negative.    Physical Exam Updated Vital Signs BP 153/99   Pulse 84   Temp 98 F (36.7 C) (Oral)   Resp 20   SpO2 99%   Physical Exam  Constitutional: He is oriented to person, place, and time.  He appears well-developed and well-nourished.  HENT:  Head: Normocephalic.  Right Ear: External ear normal.  Left Ear: External ear normal.  Mouth/Throat: Uvula is midline. No trismus in the jaw. Uvula swelling present. No oropharyngeal exudate, posterior oropharyngeal edema, posterior oropharyngeal erythema or tonsillar abscesses.  Eyes: Pupils are equal, round, and reactive to light.  Neck: Normal range of motion.  Cardiovascular: Normal rate and regular rhythm.   Pulmonary/Chest: Effort normal and breath sounds normal.  Musculoskeletal: Normal range of motion.  Lymphadenopathy:    He has no cervical adenopathy.  Neurological: He is alert and oriented to person, place, and time.  Skin: Skin is warm.  Psychiatric: He has a normal mood and affect.  Nursing note and vitals reviewed.    ED Treatments / Results  Labs (all labs ordered are listed, but only abnormal results are displayed) Labs Reviewed  RAPID STREP SCREEN (NOT AT [facility])  CULTURE, GROUP A STREP ([place])  CBG MONITORING, ED    EKG  EKG Interpretation None       Radiology No results found.  Procedures Procedures (including critical care time)  Medications Ordered in ED Medications  dexamethasone (DECADRON) tablet 6 mg (6 mg  Oral Given 07/29/16 2152)     Initial Impression / Assessment and Plan / ED Course  I have reviewed the triage vital signs and the nursing notes.  Pertinent labs & imaging results that were available during my care of the patient were reviewed by me and considered in my medical decision making (see chart for details).  Clinical Course    Will treat with PO Decadron, obtain rapid strep  Strep test is negative     Final Clinical Impressions(s) / ED Diagnoses   Final diagnoses:  Uvular swelling    New Prescriptions New Prescriptions   No medications on file     Junius Creamer, NP 07/29/16 2151    Junius Creamer, NP 07/29/16 [place], MD 07/30/16 1324

## 2016-08-01 LAB — CULTURE, GROUP A STREP (THRC)

## 2016-08-17 ENCOUNTER — Emergency Department (HOSPITAL_COMMUNITY)
Admission: EM | Admit: 2016-08-17 | Discharge: 2016-08-17 | Disposition: A | Discharge status: Home / Self Care | Payer: Medicaid Other | Attending: Emergency Medicine | Admitting: Emergency Medicine

## 2016-08-17 ENCOUNTER — Encounter (HOSPITAL_COMMUNITY): Payer: Self-pay

## 2016-08-17 ENCOUNTER — Emergency Department (HOSPITAL_COMMUNITY): Payer: Medicaid Other

## 2016-08-17 DIAGNOSIS — E119 Type 2 diabetes mellitus without complications: Secondary | ICD-10-CM | POA: Insufficient documentation

## 2016-08-17 DIAGNOSIS — Z955 Presence of coronary angioplasty implant and graft: Secondary | ICD-10-CM | POA: Insufficient documentation

## 2016-08-17 DIAGNOSIS — R079 Chest pain, unspecified: Secondary | ICD-10-CM

## 2016-08-17 DIAGNOSIS — Z79899 Other long term (current) drug therapy: Secondary | ICD-10-CM | POA: Insufficient documentation

## 2016-08-17 DIAGNOSIS — I110 Hypertensive heart disease with heart failure: Secondary | ICD-10-CM | POA: Insufficient documentation

## 2016-08-17 DIAGNOSIS — Z7982 Long term (current) use of aspirin: Secondary | ICD-10-CM | POA: Diagnosis not present

## 2016-08-17 DIAGNOSIS — Z791 Long term (current) use of non-steroidal anti-inflammatories (NSAID): Secondary | ICD-10-CM | POA: Diagnosis not present

## 2016-08-17 DIAGNOSIS — I5043 Acute on chronic combined systolic (congestive) and diastolic (congestive) heart failure: Secondary | ICD-10-CM | POA: Diagnosis not present

## 2016-08-17 DIAGNOSIS — Z87891 Personal history of nicotine dependence: Secondary | ICD-10-CM | POA: Diagnosis not present

## 2016-08-17 DIAGNOSIS — I252 Old myocardial infarction: Secondary | ICD-10-CM | POA: Diagnosis not present

## 2016-08-17 DIAGNOSIS — R0789 Other chest pain: Secondary | ICD-10-CM | POA: Insufficient documentation

## 2016-08-17 DIAGNOSIS — Z8673 Personal history of transient ischemic attack (TIA), and cerebral infarction without residual deficits: Secondary | ICD-10-CM | POA: Insufficient documentation

## 2016-08-17 DIAGNOSIS — I2510 Atherosclerotic heart disease of native coronary artery without angina pectoris: Secondary | ICD-10-CM | POA: Diagnosis not present

## 2016-08-17 LAB — BASIC METABOLIC PANEL
Anion gap: 8 (ref 5–15)
BUN: 13 mg/dL (ref 6–20)
CHLORIDE: 107 mmol/L (ref 101–111)
CO2: 24 mmol/L (ref 22–32)
CREATININE: 1.14 mg/dL (ref 0.61–1.24)
Calcium: 9.2 mg/dL (ref 8.9–10.3)
GFR calc Af Amer: >60 mL/min (ref >60)
GFR calc non Af Amer: >60 mL/min (ref >60)
GLUCOSE: 151 mg/dL — AB (ref 65–99)
POTASSIUM: 4.4 mmol/L (ref 3.5–5.1)
Sodium: 139 mmol/L (ref 135–145)

## 2016-08-17 LAB — CBC WITH DIFFERENTIAL/PLATELET
Basophils Absolute: 0 10*3/uL (ref 0.0–0.1)
Basophils Relative: 0 %
EOS ABS: 0 10*3/uL (ref 0.0–0.7)
Eosinophils Relative: 1 %
HCT: 37.7 % — ABNORMAL LOW (ref 39.0–52.0)
HEMOGLOBIN: 13.2 g/dL (ref 13.0–17.0)
LYMPHS ABS: 1.6 10*3/uL (ref 0.7–4.0)
LYMPHS PCT: 26 %
MCH: 27.3 pg (ref 26.0–34.0)
MCHC: 35 g/dL (ref 30.0–36.0)
MCV: 78.1 fL (ref 78.0–100.0)
MONOS PCT: 6 %
Monocytes Absolute: 0.4 10*3/uL (ref 0.1–1.0)
NEUTROS PCT: 67 %
Neutro Abs: 4.1 10*3/uL (ref 1.7–7.7)
Platelets: 219 10*3/uL (ref 150–400)
RBC: 4.83 MIL/uL (ref 4.22–5.81)
RDW: 14 % (ref 11.5–15.5)
WBC: 6.1 10*3/uL (ref 4.0–10.5)

## 2016-08-17 LAB — I-STAT TROPONIN, ED
TROPONIN I, POC: 0.01 ng/mL (ref 0.00–0.08)
TROPONIN I, POC: 0.01 ng/mL (ref 0.00–0.08)

## 2016-08-17 LAB — RAPID URINE DRUG SCREEN, HOSP PERFORMED
AMPHETAMINES: NOT DETECTED
BARBITURATES: NOT DETECTED
Benzodiazepines: NOT DETECTED
Cocaine: NOT DETECTED
Opiates: NOT DETECTED
TETRAHYDROCANNABINOL: NOT DETECTED

## 2016-08-17 LAB — INFLUENZA PANEL BY PCR (TYPE A & B)
Influenza A By PCR: NEGATIVE
Influenza B By PCR: NEGATIVE

## 2016-08-17 LAB — ETHANOL: Alcohol, Ethyl (B): <5 mg/dL (ref <5)

## 2016-08-17 LAB — BRAIN NATRIURETIC PEPTIDE: B Natriuretic Peptide: 183.5 pg/mL — ABNORMAL HIGH (ref 0.0–100.0)

## 2016-08-17 NOTE — ED Provider Notes (Signed)
[facility] DEPT Provider Note   CSN: [id number] Arrival date & time: 08/17/16  1738     History   Chief Complaint Chief Complaint  Patient presents with  . Chest Pain  . Dizziness    HPI Antonio Guerra is a 66 y.o. male.  HPI   Pt with hx HTN, HLD, CAD s/p MI (stent x 2), stroke, diabetes, CHF (EF 20-25%) p/w chest pain, lightheadedness, SOB, body aches, cough.  Pt was at his PCP this morning for follow up for ongoing abdominal pain from an ulcer and reported his chest pain symptoms, was sent to ED.  States the pain began around 11am, was a "slow pain" and pressure.  It has been constant.  He is hurting all over, it is all worse with palpation.  Denies nausea, diaphoresis, hemoptysis, fevers.  Last cath was March 2017, demonstrated patent stable disease.      Past Medical History:  Diagnosis Date  . Arthritis    "all over" (12/20/2015)  . Chronic lower back pain   . Chronic systolic congestive heart failure ([place])    a. 04/2014 Echo: EF 20-25%.  . Coronary artery disease    a. 2011 MI x 2 with PCI: stent x 2 (LAD and RI) @ [facility] in [place], [place];  b. 04/2014 Cath/PCI: LM 10-20, LAD 40p, 34m 880mSR(3.5x38 Xience DES), 50apical, LCX 20 diffuse, RI 50p, 2093mR, RCA 40-38m65md.  . Fibromyalgia   . Hyperlipidemia   . Hypertension   . Hypertensive urgency 09/27/2015  . Myocardial infarction 2015  . [facility]   "right hand weaker since" (12/20/2015)  . [facility]  Patient Active Problem List   Diagnosis Date Noted  . Altered mental status   . 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 Alcohol intoxication (HCC)[place]/07/2015  . Substance-induced anxiety disorder (HCC)[place]/07/2015  . Epigastric pain   . Non compliance w medication regimen   . Elevated troponin   . Type 2 diabetes mellitus (HCC)[name]/06/2016  . Hypertensive urgency 09/27/2015  . Acute on chronic combined systolic (congestive) and diastolic  (congestive) heart failure 09/27/2015  . Ischemic cardiomyopathy   . Hyperlipidemia   . Coronary artery disease   . Chronic systolic congestive heart failure (HCC)[place]. Unstable angina (HCC)[place]/23/2015  . Stroke (HCC)[place]. Uncontrolled hypertension   . Diabetes mellitus without complication (HCC)[place]. Precordial chest pain 05/09/2014    Past Surgical History:  Procedure Laterality Date  . ANKLE FRACTURE SURGERY Right   . CARDIAC CATHETERIZATION N/A 09/30/2015   Procedure: Left Heart Cath and Coronary Angiography;  Surgeon: ChriBurnell Blanks;  [facility];  Service: Cardiovascular;  Laterality: N/A;  . CORONARY ANGIOPLASTY WITH STENT PLACEMENT     "total of 3 stents" (12/20/2015)  . FRACTIONAL FLOW RESERVE WIRE  05/11/2014   Procedure: FRACTIONAL FLOW RESERVE WIRE;  Surgeon: ChriBurnell Blanks;  [facility];  Service: Cardiovascular;;  . FRACTURE SURGERY    . LEFT HEART CATHETERIZATION WITH CORONARY/GRAFT ANGIOGRAM N/A 05/11/2014   Procedure: LEFT HEART CATHETERIZATION WITH COROBeatrix Fettersurgeon: ChriBurnell Blanks;  [facility];  Service: Cardiovascular;  Laterality: N/A;  . PERCUTANEOUS CORONARY STENT INTERVENTION (PCI-S)  05/11/2014   Procedure: PERCUTANEOUS CORONARY STENT INTERVENTION (PCI-S);  Surgeon: ChriBurnell Blanks;  [facility];  Service: Cardiovascular;;  Home Medications    Prior to Admission medications   Medication Sig Start Date End Date Taking? Authorizing Provider  aspirin EC 81 MG tablet Take 81 mg by mouth daily.    Historical Provider, MD  atorvastatin (LIPITOR) 80 MG tablet Take 1 tablet (80 mg total) by mouth daily at 6 PM. 12/21/15   Modena Jansky, MD  blood glucose meter kit and supplies Dispense based on patient and insurance preference. Use up to four times daily as directed. (FOR ICD-9 250.00, 250.01). 10/02/15   Ripudeep Krystal Eaton, MD  carvedilol (COREG) 25 MG  tablet Take 1 tablet (25 mg total) by mouth 2 (two) times daily with a meal. 12/21/15   Modena Jansky, MD  Cholecalciferol (VITAMIN D PO) Take 1 tablet by mouth daily.    Historical Provider, MD  folic acid (FOLVITE) 1 MG tablet Take 1 mg by mouth daily.    Historical Provider, MD  furosemide (LASIX) 40 MG tablet Take 1 tablet (40 mg total) by mouth 2 (two) times daily. 10/02/15   Bhavinkumar Bhagat, PA  hydrALAZINE (APRESOLINE) 25 MG tablet Take 1 tablet (25 mg total) by mouth every 8 (eight) hours. Patient taking differently: Take 25 mg by mouth daily.  10/02/15   Bhavinkumar Bhagat, PA  insulin glargine (LANTUS) 100 UNIT/ML injection Inject 0.2-0.25 mLs (20-25 Units total) into the skin 2 (two) times daily. Take 20 units every morning and 25 units at bedtime. 10/02/15   Ripudeep Krystal Eaton, MD  isosorbide mononitrate (IMDUR) 30 MG 24 hr tablet Take 1 tablet (30 mg total) by mouth daily. 10/02/15   Bhavinkumar Bhagat, PA  ivabradine (CORLANOR) 5 MG TABS tablet Take 1 tablet (5 mg total) by mouth 2 (two) times daily with a meal. 10/02/15   Bhavinkumar Bhagat, PA  metFORMIN (GLUCOPHAGE) 500 MG tablet Take 1 tablet (500 mg total) by mouth daily with breakfast. 10/02/15   Ripudeep Krystal Eaton, MD  nitroGLYCERIN (NITROSTAT) 0.4 MG SL tablet Place 1 tablet (0.4 mg total) under the tongue every 5 (five) minutes as needed for chest pain. 05/12/14   Brett Canales, PA-C  pantoprazole (PROTONIX) 40 MG tablet Take 1 tablet (40 mg total) by mouth daily. Patient taking differently: Take 40 mg by mouth daily as needed (HEARTBURN).  12/21/15   Modena Jansky, MD  spironolactone (ALDACTONE) 25 MG tablet Take 1 tablet (25 mg total) by mouth daily. 10/02/15   Bhavinkumar Bhagat, PA  sucralfate (CARAFATE) 1 g tablet Take 1 tablet (1 g total) by mouth 4 (four) times daily -  with meals and at bedtime. Patient taking differently: Take 1 g by mouth daily.  03/16/16   Dorie Rank, MD    Family History Family History  Problem Relation Age  of Onset  . Diabetes Mother   . CAD Neg Hx     Social History Social History  Substance Use Topics  . Smoking status: Former Research scientist (life sciences)  . Smokeless tobacco: Never Used     Comment: 12/20/2015 "smoked cigarettes 30-12yrago"  . Alcohol use 3.0 oz/week    5 Cans of beer per week     Allergies   Tape   Review of Systems Review of Systems  All other systems reviewed and are negative.    Physical Exam Updated Vital Signs BP 116/81   Pulse 89   Temp 97.7 F (36.5 C) (Oral)   Resp 16   SpO2 94%   Physical Exam  Constitutional: He appears well-developed and well-nourished. No  distress.  HENT:  Head: Normocephalic and atraumatic.  Neck: Neck supple.  Cardiovascular: Normal rate and regular rhythm.   Pulmonary/Chest: Effort normal and breath sounds normal. No respiratory distress. He has no wheezes. He has no rales. He exhibits tenderness.  Abdominal: Soft. He exhibits no distension and no mass. There is generalized tenderness. There is no rebound and no guarding.  Neurological: He is alert. He exhibits normal muscle tone.  Skin: He is not diaphoretic.  Nursing note and vitals reviewed.    ED Treatments / Results  Labs (all labs ordered are listed, but only abnormal results are displayed) Labs Reviewed  CBC WITH DIFFERENTIAL/PLATELET - Abnormal; Notable for the following:       Result Value   HCT 37.7 (*)    All other components within normal limits  BASIC METABOLIC PANEL - Abnormal; Notable for the following:    Glucose, Bld 151 (*)    All other components within normal limits  BRAIN NATRIURETIC PEPTIDE - Abnormal; Notable for the following:    B Natriuretic Peptide 183.5 (*)    All other components within normal limits  ETHANOL  INFLUENZA PANEL BY PCR (TYPE A & B)  RAPID URINE DRUG SCREEN, HOSP PERFORMED  I-STAT TROPOININ, ED    EKG  EKG Interpretation  Date/Time:  Monday August 17 2016 17:46:48 EST Ventricular Rate:  87 PR Interval:    QRS  Duration: 109 QT Interval:  399 QTC Calculation: 480 R Axis:   -66 Text Interpretation:  Sinus rhythm Left anterior fascicular block LVH with secondary repolarization abnormality Anterior Q waves, possibly due to LVH TW inversions in lateral leads and inferior leads similar to prior No significant change since last tracing Confirmed by [facility] MD, ERIN ([id number]) on 08/17/2016 5:58:12 PM       Radiology Dg Chest 2 View  Result Date: 08/17/2016 CLINICAL DATA:  Chest pain and dizziness today EXAM: CHEST  2 VIEW COMPARISON:  03/16/2016 FINDINGS: Normal heart size. Lungs clear. No pneumothorax. No pleural effusion. IMPRESSION: No active cardiopulmonary disease. Electronically Signed   By: Marybelle Killings M.D.   On: 08/17/2016 19:51    Procedures Procedures (including critical care time)  Medications Ordered in ED Medications - No data to display   Initial Impression / Assessment and Plan / ED Course  I have reviewed the triage vital signs and the nursing notes.  Pertinent labs & imaging results that were available during my care of the patient were reviewed by me and considered in my medical decision making (see chart for details).    Pt with CAD, CHF, HTN, HLD, DM p/w central chest pain with associated SOB, lightheadedness, myalgias.  EKG without acute changes.  Labs remarkable for mild BNP elevation, mild hyperglycemia.  CXRnegative.  Flu negative.  Delta troponin pending at change of shift.  Discussed pt with Dr Billy Fischer who will also see patient.  Signed out to [employer].    Final Clinical Impressions(s) / ED Diagnoses   Final diagnoses:  Chest pain, unspecified type    New Prescriptions New Prescriptions   No medications on file     Clayton Bibles, PA-C 08/17/16 2014    Gareth Morgan, MD 08/18/16 1746

## 2016-08-17 NOTE — Discharge Instructions (Signed)
Read the information below.  You may return to the Emergency Department at any time for worsening condition or any new symptoms that concern you.   If you develop worsening chest pain, shortness of breath, fever, you pass out, or become weak or dizzy, return to the ER for a recheck.    °

## 2016-08-17 NOTE — ED Triage Notes (Signed)
Pt being seen by PCP, pt complaining of CP and dizziness ~12pm today. Pt complaining of central chest pain that radiates to L arm. EMS gave 3 nitro 324 ASA enroute.

## 2016-08-17 NOTE — ED Notes (Signed)
[facility]

## 2016-11-05 IMAGING — CT CT ABD-PELV W/O CM
2 of 4 series · 16 of 46 positions shown, 18 images · non-contrast
Comparison: None.

CLINICAL DATA: Epigastric pain.

EXAM:
CT ABDOMEN AND PELVIS WITHOUT CONTRAST
TECHNIQUE: Multidetector CT imaging of the abdomen and pelvis was performed
following the standard protocol without IV contrast.

[Series 2: a/p w/o 5mm · axial · non-contrast · 0.71mm/px · z∈[-539,-29]mm · 13 of 112 slices shown, 15 images]
[im 5/112  soft-tissue]
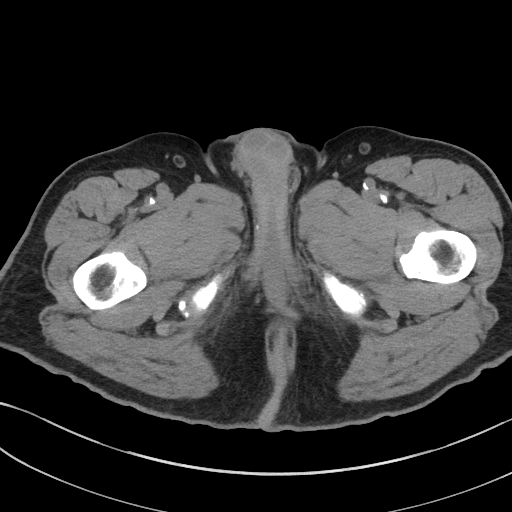
[im 5/112  bone]
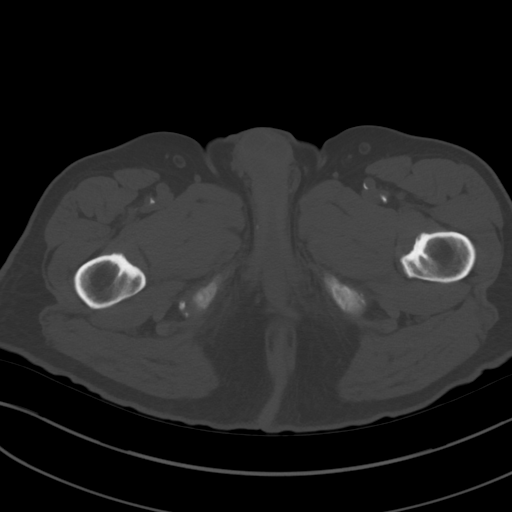
[im 14/112  soft-tissue]
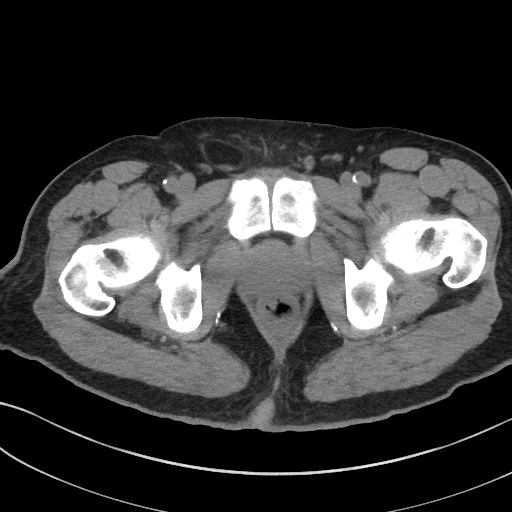
[im 24/112  soft-tissue]
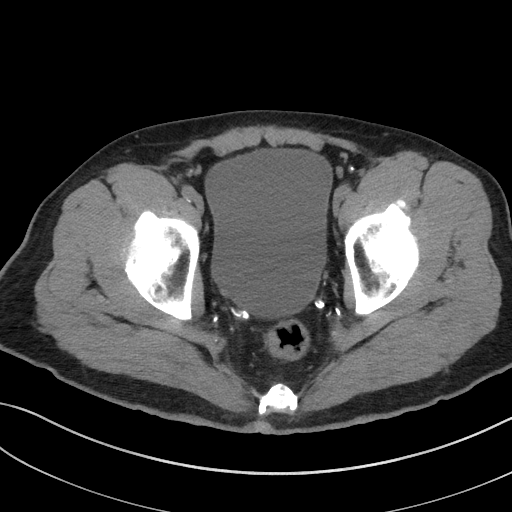
[im 33/112  soft-tissue]
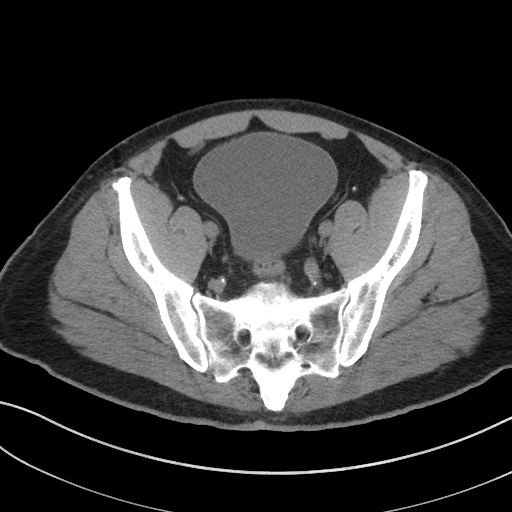
[im 38/112  soft-tissue]
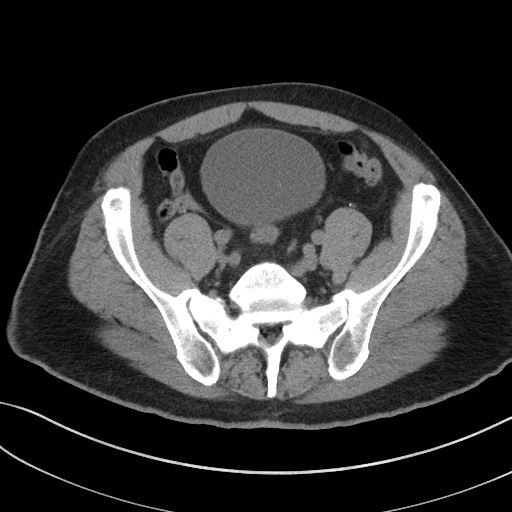
[im 47/112  soft-tissue]
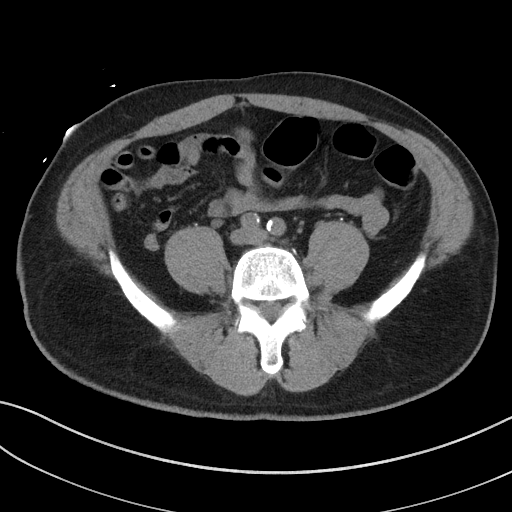
[im 56/112  soft-tissue]
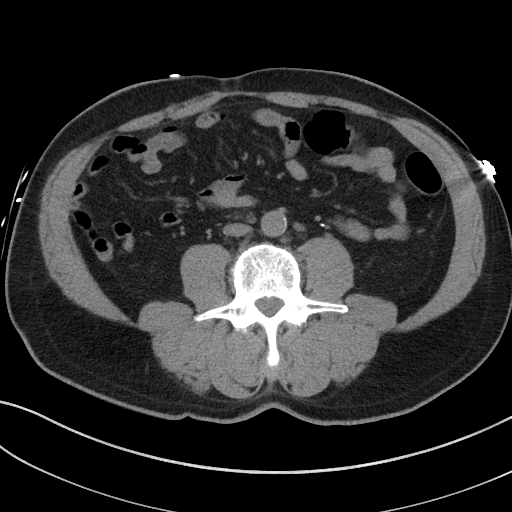
[im 65/112  soft-tissue]
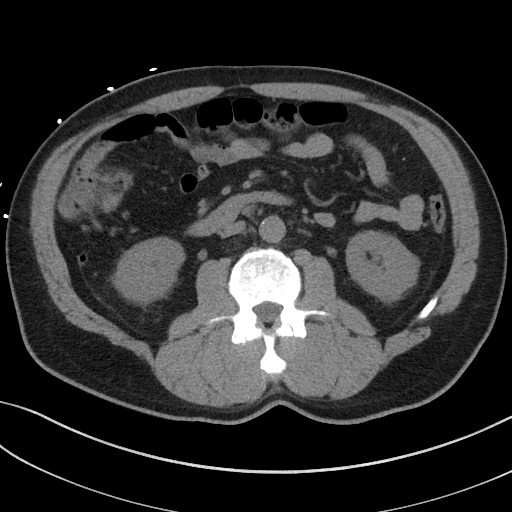
[im 75/112  soft-tissue]
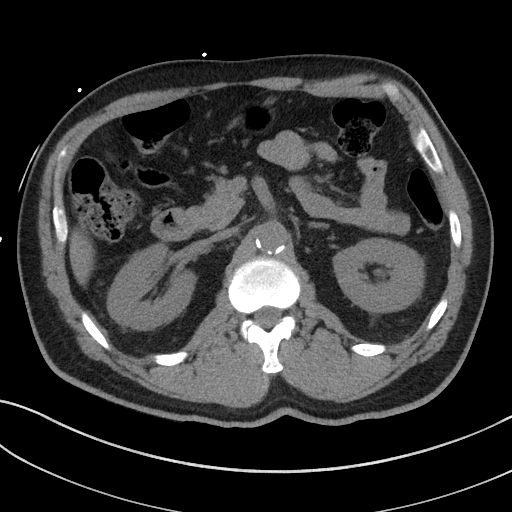
[im 75/112  bone]
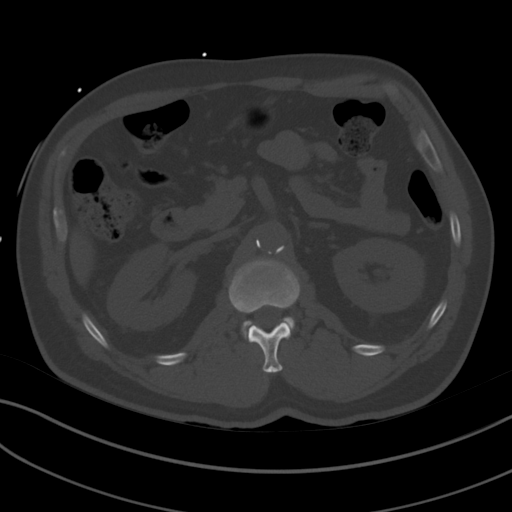
[im 79/112  soft-tissue]
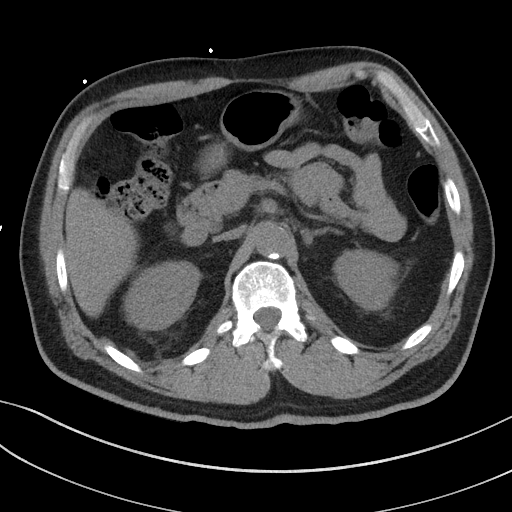
[im 88/112  soft-tissue]
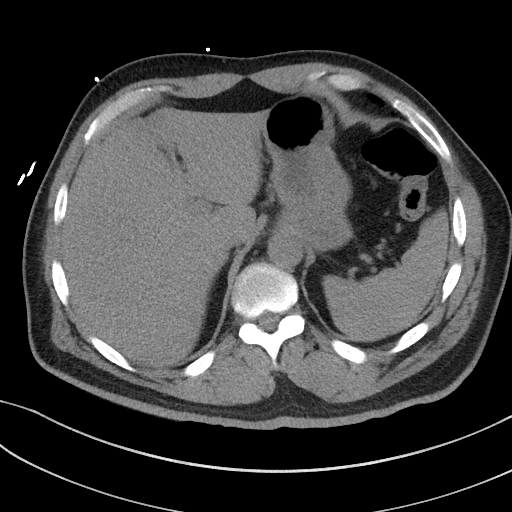
[im 98/112  soft-tissue]
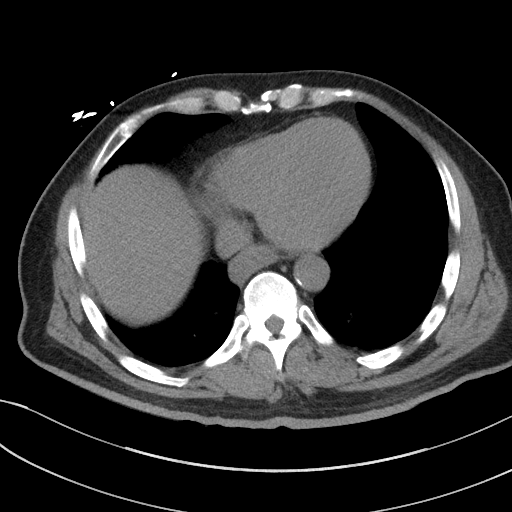
[im 107/112  soft-tissue]
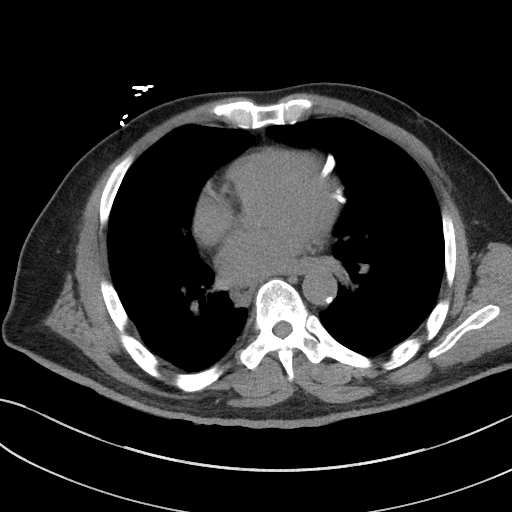

[Series 5: a/p w/o cor · coronal · non-contrast · 0.88mm/px · 3 of 139 slices shown]
[im 47/139  soft-tissue]
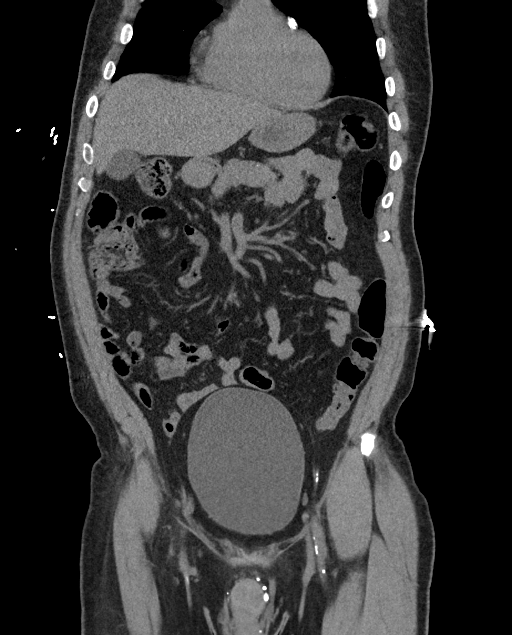
[im 62/139  soft-tissue]
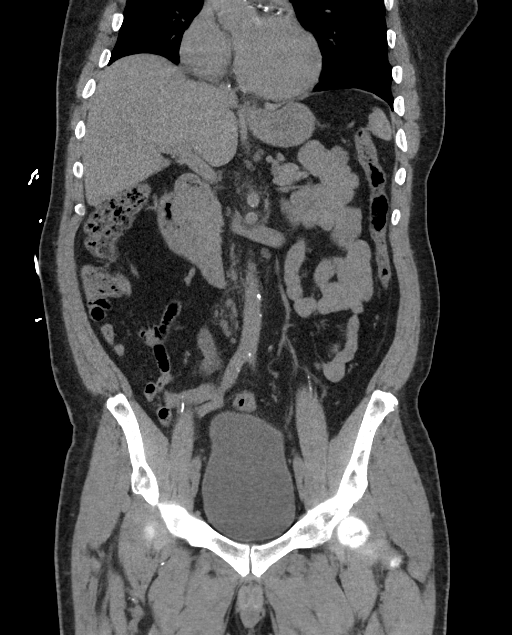
[im 77/139  soft-tissue]
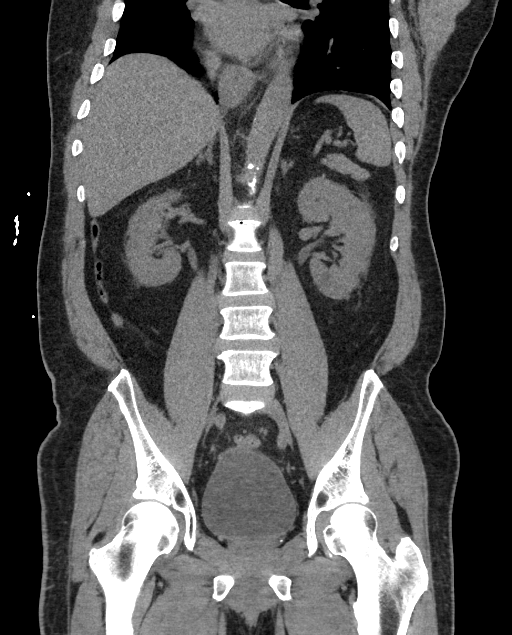

[16 of 46 positions shown; findings below may reference images not displayed]

FINDINGS: Lower chest: The included lung bases are clear. Coronary artery
calcifications are seen. The heart is enlarged. There is thickening
of the distal esophagus.

Liver: Limited assessment for focal lesion due to lack contrast.
Borderline steatosis.

Hepatobiliary: Gallbladder physiologically distended, no calcified
stone. No biliary dilatation.

Pancreas: Scatter pancreatic calcifications, sequela of chronic
pancreatitis. Question of minimal peripancreatic edema versus motion
artifact about the body.

Spleen: Normal in size.

Adrenal glands: No nodule.

Kidneys: No hydronephrosis. No nephrolithiasis. Bilateral
nonspecific perinephric edema. Question and scarring in the lower
left kidney. Hypodense lesion in the lower left kidney incompletely
characterized without contrast.

Stomach/Bowel: Small hiatal hernia and distal esophageal wall
thickening. Stomach physiologically distended with ingested
contents. Small-moderate volume of stool throughout the colon
without colonic wall thickening. Mild diverticulosis of the distal
colon without diverticulitis. The appendix is normal.

Vascular/Lymphatic: No retroperitoneal adenopathy. Abdominal aorta
is normal in caliber. Atherosclerosis without aneurysm

Reproductive: Prominent sized prostate gland measuring 5.5 cm
transverse.

Bladder: Distended.  No bladder wall thickening.  No bladder stone.

Other: No free air, free fluid, or intra-abdominal fluid collection.
There is fat within the right inguinal canal.

Musculoskeletal: There are no acute or suspicious osseous
abnormalities. Transitional lumbosacral anatomy.
IMPRESSION: 1. Pancreatic calcifications, likely sequela of chronic
pancreatitis. There is questionable minimal peripancreatic
inflammation versus artifact. This may reflect mild acute on chronic
pancreatitis. Recommend correlation with graphic enzymes.
2. Small hiatal hernia with distal esophageal wall thickening, can
be seen with reflux.
3. Atherosclerosis including coronary artery calcifications.

## 2017-01-23 ENCOUNTER — Other Ambulatory Visit: Payer: Self-pay | Admitting: Physician Assistant

## 2017-03-19 ENCOUNTER — Ambulatory Visit (INDEPENDENT_AMBULATORY_CARE_PROVIDER_SITE_OTHER): Payer: Medicaid Other | Admitting: Family Medicine

## 2017-03-19 ENCOUNTER — Encounter: Payer: Self-pay | Admitting: Family Medicine

## 2017-03-19 VITALS — BP 120/70 | HR 100 | Temp 98.6°F | Wt 183.0 lb

## 2017-03-19 DIAGNOSIS — Z7689 Persons encountering health services in other specified circumstances: Secondary | ICD-10-CM

## 2017-03-19 DIAGNOSIS — I5022 Chronic systolic (congestive) heart failure: Secondary | ICD-10-CM

## 2017-03-19 DIAGNOSIS — E1159 Type 2 diabetes mellitus with other circulatory complications: Secondary | ICD-10-CM | POA: Diagnosis not present

## 2017-03-19 DIAGNOSIS — I10 Essential (primary) hypertension: Secondary | ICD-10-CM

## 2017-03-19 MED ORDER — GABAPENTIN 300 MG PO CAPS: 300.0000 mg | ORAL_CAPSULE | Freq: Three times a day (TID) | ORAL | 3 refills | Status: DC

## 2017-03-19 MED ORDER — CLOPIDOGREL BISULFATE 75 MG PO TABS: 75.0000 mg | ORAL_TABLET | Freq: Every day | ORAL | 3 refills | Status: DC

## 2017-03-19 MED ORDER — ISOSORBIDE MONONITRATE ER 30 MG PO TB24: 30.0000 mg | ORAL_TABLET | Freq: Every day | ORAL | 2 refills | Status: DC

## 2017-03-19 MED ORDER — SITAGLIPTIN PHOSPHATE 100 MG PO TABS: 100.0000 mg | ORAL_TABLET | Freq: Every day | ORAL | 0 refills | Status: DC

## 2017-03-19 MED ORDER — ISOSORBIDE MONONITRATE ER 30 MG PO TB24: 30.0000 mg | ORAL_TABLET | Freq: Every day | ORAL | 6 refills | Status: DC

## 2017-03-20 LAB — CBC WITH DIFFERENTIAL/PLATELET
BASOS ABS: 0 10*3/uL (ref 0.0–0.2)
BASOS: 0 %
EOS (ABSOLUTE): 0.1 10*3/uL (ref 0.0–0.4)
Eos: 2 %
Hematocrit: 33.4 % — ABNORMAL LOW (ref 37.5–51.0)
Hemoglobin: 11.2 g/dL — ABNORMAL LOW (ref 13.0–17.7)
IMMATURE GRANS (ABS): 0 10*3/uL (ref 0.0–0.1)
IMMATURE GRANULOCYTES: 0 %
LYMPHS: 46 %
Lymphocytes Absolute: 2.3 10*3/uL (ref 0.7–3.1)
MCH: 25.4 pg — ABNORMAL LOW (ref 26.6–33.0)
MCHC: 33.5 g/dL (ref 31.5–35.7)
MCV: 76 fL — AB (ref 79–97)
MONOS ABS: 0.5 10*3/uL (ref 0.1–0.9)
Monocytes: 9 %
NEUTROS PCT: 43 %
Neutrophils Absolute: 2.2 10*3/uL (ref 1.4–7.0)
PLATELETS: 267 10*3/uL (ref 150–379)
RBC: 4.41 x10E6/uL (ref 4.14–5.80)
RDW: 14.6 % (ref 12.3–15.4)
WBC: 5.1 10*3/uL (ref 3.4–10.8)

## 2017-03-20 LAB — CMP14+EGFR
A/G RATIO: 1.5 (ref 1.2–2.2)
ALT: 10 IU/L (ref 0–44)
AST: 16 IU/L (ref 0–40)
Albumin: 4.3 g/dL (ref 3.6–4.8)
Alkaline Phosphatase: 95 IU/L (ref 39–117)
BILIRUBIN TOTAL: 0.4 mg/dL (ref 0.0–1.2)
BUN / CREAT RATIO: 14 (ref 10–24)
BUN: 23 mg/dL (ref 8–27)
CALCIUM: 9.4 mg/dL (ref 8.6–10.2)
CHLORIDE: 107 mmol/L — AB (ref 96–106)
CO2: 24 mmol/L (ref 20–29)
Creatinine, Ser: 1.64 mg/dL — ABNORMAL HIGH (ref 0.76–1.27)
GFR calc non Af Amer: 44 mL/min/{1.73_m2} — ABNORMAL LOW (ref >59)
GFR, EST AFRICAN AMERICAN: 50 mL/min/{1.73_m2} — AB (ref >59)
GLUCOSE: 106 mg/dL — AB (ref 65–99)
Globulin, Total: 2.9 g/dL (ref 1.5–4.5)
POTASSIUM: 4 mmol/L (ref 3.5–5.2)
SODIUM: 144 mmol/L (ref 134–144)
Total Protein: 7.2 g/dL (ref 6.0–8.5)

## 2017-03-20 LAB — HEMOGLOBIN A1C
Est. average glucose Bld gHb Est-mCnc: 203 mg/dL
Hgb A1c MFr Bld: 8.7 % — ABNORMAL HIGH (ref 4.8–5.6)

## 2017-03-23 NOTE — Progress Notes (Signed)
Subjective:    Patient ID: Antonio Guerra, male    DOB: 08/08/1950, 66 y.o.   MRN: [id number]   CC: Establishing care with new PCP  HPI: Patient is 66 yo male with a past medical history significant for T2DM, HLD, HTN, GERD, CVA,  CHF alcohol and drug abuse who presents today to establish care. Patient has an extensive cardiac history with stenting. Patient currently with no acute complaints. Patient reports adherence with medications regimen with help of his daughter. Patient reports some tingling and burning in lower extremities bilaterally and reports recently stopping metformin due do diarrhea. Patient denies alcohol or drug use in the past 3 years. Patient denies any chest pain, shortness of breath, abdominal pain, nausea , vomiting, fever and chills. Patient and wife have moved in with their daughter who has been helping them with medications.   Smoking status reviewed   ROS: all other systems were reviewed and are negative other than in the HPI   Past Medical History:  Diagnosis Date  . Arthritis    "all over" (12/20/2015)  . Chronic lower back pain   . Chronic systolic congestive heart failure ([place])    a. 04/2014 Echo: EF 20-25%.  . Coronary artery disease    a. 2011 MI x 2 with PCI: stent x 2 (LAD and RI) @ [facility] in [place], [place];  b. 04/2014 Cath/PCI: LM 10-20, LAD 40p, 27m, 63m ISR(3.5x38 Xience DES), 50apical, LCX 20 diffuse, RI 50p, 35m ISR, RCA 40-32m, 30d.  . Fibromyalgia   . Hyperlipidemia   . Hypertension   . Hypertensive urgency 09/27/2015  . Myocardial infarction ([place]) 2015  . Stroke [facility]) 2012   "right hand weaker since" (12/20/2015)  . Type II diabetes mellitus ([place])     Past Surgical History:  Procedure Laterality Date  . ANKLE FRACTURE SURGERY Right   . CARDIAC CATHETERIZATION N/A 09/30/2015   Procedure: Left Heart Cath and Coronary Angiography;  Surgeon: Burnell Blanks, MD;  Location: [facility] CV LAB;  Service: Cardiovascular;  Laterality:  N/A;  . CORONARY ANGIOPLASTY WITH STENT PLACEMENT     "total of 3 stents" (12/20/2015)  . FRACTIONAL FLOW RESERVE WIRE  05/11/2014   Procedure: FRACTIONAL FLOW RESERVE WIRE;  Surgeon: Burnell Blanks, MD;  Location: [facility] CATH LAB;  Service: Cardiovascular;;  . FRACTURE SURGERY    . LEFT HEART CATHETERIZATION WITH CORONARY/GRAFT ANGIOGRAM N/A 05/11/2014   Procedure: LEFT HEART CATHETERIZATION WITH Beatrix Fetters;  Surgeon: Burnell Blanks, MD;  Location: [name] LAB;  Service: Cardiovascular;  Laterality: N/A;  . PERCUTANEOUS CORONARY STENT INTERVENTION (PCI-S)  05/11/2014   Procedure: PERCUTANEOUS CORONARY STENT INTERVENTION (PCI-S);  Surgeon: Burnell Blanks, MD;  Location: [facility] CATH LAB;  Service: Cardiovascular;;    Past medical history, surgical, family, and social history reviewed and updated in the EMR as appropriate.  Objective:  BP 120/70   Pulse 100   Temp 98.6 F (37 C) (Oral)   Wt 183 lb (83 kg)   SpO2 99%   BMI 24.14 kg/m   Vitals and nursing note reviewed  General: NAD, pleasant, able to participate in exam Cardiac: RRR, normal heart sounds, no murmurs. 2+ radial and PT pulses bilaterally Respiratory: CTAB, normal effort, No wheezes, rales or rhonchi Abdomen: soft, nontender, nondistended, no hepatic or splenomegaly, +BS Extremities: no edema or cyanosis. WWP. Skin: warm and dry, no rashes noted Neuro: alert and oriented x4, no focal deficits Psych: Normal affect and mood   Assessment & Plan:    #  Establishing care with new PCP Will order basic lab work to establish baseline. Patient with colonoscopy in 2017. Will recommend flu and pneumonia vaccine at next OV. --Order CBC w/diff, CMP  #T2DM, uncontrolled  Last A1c on record 93.1 on 09/2015. Patient reports intolerance with metformin with significant diarrhea. However, patient is currently on Lantus 20 u in the am and 25 in the pm. Patient is currently only taking 500 mg of metformin  with confirmed diarrhea. Pain and tingling consistent with neuropathy in the setting of uncontrolled diabetes. --Will stop metformin --Start patient on Januvia 100 mg  --Continue current Lantus regimen --Follow up on A1c  --Increase gabapentin to 600 mg bid --Measure BG 4 timesa day for further insulin titration with fasting <120   #Continue current medications regimen for other chronic issues   Marjie Skiff, MD [place] PGY-2

## 2017-04-19 ENCOUNTER — Other Ambulatory Visit: Payer: Self-pay | Admitting: Family Medicine

## 2017-04-19 DIAGNOSIS — E1159 Type 2 diabetes mellitus with other circulatory complications: Secondary | ICD-10-CM

## 2017-04-28 ENCOUNTER — Ambulatory Visit: Payer: Self-pay | Admitting: Family Medicine

## 2017-05-22 ENCOUNTER — Other Ambulatory Visit: Payer: Self-pay | Admitting: Family Medicine

## 2017-05-22 DIAGNOSIS — E1159 Type 2 diabetes mellitus with other circulatory complications: Secondary | ICD-10-CM

## 2017-07-07 ENCOUNTER — Other Ambulatory Visit: Payer: Self-pay | Admitting: Family Medicine

## 2017-07-07 DIAGNOSIS — E1159 Type 2 diabetes mellitus with other circulatory complications: Secondary | ICD-10-CM

## 2017-08-03 ENCOUNTER — Other Ambulatory Visit: Payer: Self-pay | Admitting: Family Medicine

## 2017-08-17 ENCOUNTER — Other Ambulatory Visit: Payer: Self-pay | Admitting: Family Medicine

## 2017-08-17 DIAGNOSIS — E1159 Type 2 diabetes mellitus with other circulatory complications: Secondary | ICD-10-CM

## 2017-10-06 ENCOUNTER — Other Ambulatory Visit: Payer: Self-pay | Admitting: Family Medicine

## 2017-11-01 ENCOUNTER — Other Ambulatory Visit: Payer: Self-pay | Admitting: Family Medicine

## 2017-11-02 ENCOUNTER — Ambulatory Visit (INDEPENDENT_AMBULATORY_CARE_PROVIDER_SITE_OTHER): Payer: Medicare Other | Admitting: Family Medicine

## 2017-11-02 ENCOUNTER — Other Ambulatory Visit: Payer: Self-pay

## 2017-11-02 ENCOUNTER — Encounter: Payer: Self-pay | Admitting: Family Medicine

## 2017-11-02 VITALS — BP 110/62 | HR 94 | Temp 97.9°F | Wt 180.0 lb

## 2017-11-02 DIAGNOSIS — E1159 Type 2 diabetes mellitus with other circulatory complications: Secondary | ICD-10-CM | POA: Diagnosis not present

## 2017-11-02 DIAGNOSIS — R5383 Other fatigue: Secondary | ICD-10-CM | POA: Diagnosis not present

## 2017-11-02 DIAGNOSIS — I10 Essential (primary) hypertension: Secondary | ICD-10-CM

## 2017-11-02 LAB — POCT GLYCOSYLATED HEMOGLOBIN (HGB A1C): Hemoglobin A1C: 12.3

## 2017-11-02 MED ORDER — CARVEDILOL 25 MG PO TABS: 25.0000 mg | ORAL_TABLET | Freq: Two times a day (BID) | ORAL | 0 refills | Status: DC

## 2017-11-02 MED ORDER — INSULIN GLARGINE 100 UNIT/ML SOLOSTAR PEN: 30.0000 [IU] | PEN_INJECTOR | Freq: Two times a day (BID) | SUBCUTANEOUS | 11 refills | Status: DC

## 2017-11-02 MED ORDER — LANCET DEVICE MISC: 0 refills | Status: DC

## 2017-11-02 MED ORDER — GLUCOSE BLOOD VI STRP: ORAL_STRIP | 12 refills | Status: DC

## 2017-11-02 MED ORDER — INSULIN ASPART 100 UNIT/ML FLEXPEN: 10.0000 [IU] | PEN_INJECTOR | Freq: Three times a day (TID) | SUBCUTANEOUS | 11 refills | Status: DC

## 2017-11-02 MED ORDER — ONETOUCH ULTRASOFT LANCETS MISC: 12 refills | Status: DC

## 2017-11-02 MED ORDER — ISOSORBIDE MONONITRATE ER 30 MG PO TB24: 30.0000 mg | ORAL_TABLET | Freq: Every day | ORAL | 6 refills | Status: DC

## 2017-11-02 MED ORDER — ONETOUCH VERIO W/DEVICE KIT: 30.0000 [IU] | PACK | Freq: Every day | 0 refills | Status: AC

## 2017-11-02 NOTE — Patient Instructions (Addendum)
It was great seeing you today! We have addressed the following issues today  1. Stop taking the Januvia. 2. Increase your Lantus to 30 units in the morning and in the evening. 3. Check your blood glucose in the morning before breakfast and two hours after lunch and dinner. 4. I am starting you on fast acting insulin called Novolog please take 10 units with your two biggest meals. 5. I referred you to podiatry for your feet. 6. I put in orders for home health nurse aid.  If we did any lab work today, and the results require attention, either me or my nurse will get in touch with you. If everything is normal, you will get a letter in mail and a message via . If you don't hear from [place] in two weeks, please give [place] a call. Otherwise, we look forward to seeing you again at your next visit. If you have any questions or concerns before then, please call the clinic at [phone].  Please bring all your medications to every doctors visit  Sign up for My Chart to have easy access to your labs results, and communication with your Primary care physician. Please ask Front Desk for some assistance.   Please check-out at the front desk before leaving the clinic.    Take Care,   Dr. Andy Gauss

## 2017-11-03 LAB — CBC WITH DIFFERENTIAL/PLATELET
BASOS ABS: 0 10*3/uL (ref 0.0–0.2)
BASOS: 0 %
EOS (ABSOLUTE): 0.1 10*3/uL (ref 0.0–0.4)
Eos: 2 %
Hematocrit: 35.2 % — ABNORMAL LOW (ref 37.5–51.0)
Hemoglobin: 11.2 g/dL — ABNORMAL LOW (ref 13.0–17.7)
IMMATURE GRANS (ABS): 0 10*3/uL (ref 0.0–0.1)
IMMATURE GRANULOCYTES: 0 %
LYMPHS: 38 %
Lymphocytes Absolute: 1.9 10*3/uL (ref 0.7–3.1)
MCH: 23.5 pg — AB (ref 26.6–33.0)
MCHC: 31.8 g/dL (ref 31.5–35.7)
MCV: 74 fL — AB (ref 79–97)
MONOCYTES: 7 %
Monocytes Absolute: 0.4 10*3/uL (ref 0.1–0.9)
NEUTROS ABS: 2.7 10*3/uL (ref 1.4–7.0)
NEUTROS PCT: 53 %
PLATELETS: 264 10*3/uL (ref 150–379)
RBC: 4.76 x10E6/uL (ref 4.14–5.80)
RDW: 15.2 % (ref 12.3–15.4)
WBC: 5.1 10*3/uL (ref 3.4–10.8)

## 2017-11-03 LAB — BASIC METABOLIC PANEL
BUN/Creatinine Ratio: 17 (ref 10–24)
BUN: 23 mg/dL (ref 8–27)
CALCIUM: 9.5 mg/dL (ref 8.6–10.2)
CHLORIDE: 98 mmol/L (ref 96–106)
CO2: 24 mmol/L (ref 20–29)
CREATININE: 1.36 mg/dL — AB (ref 0.76–1.27)
GFR calc Af Amer: 63 mL/min/{1.73_m2} (ref >59)
GFR calc non Af Amer: 54 mL/min/{1.73_m2} — ABNORMAL LOW (ref >59)
GLUCOSE: 349 mg/dL — AB (ref 65–99)
Potassium: 4.4 mmol/L (ref 3.5–5.2)
Sodium: 136 mmol/L (ref 134–144)

## 2017-11-03 LAB — LIPID PANEL
CHOLESTEROL TOTAL: 117 mg/dL (ref 100–199)
Chol/HDL Ratio: 3.4 ratio (ref 0.0–5.0)
HDL: 34 mg/dL — AB (ref >39)
LDL CALC: 40 mg/dL (ref 0–99)
TRIGLYCERIDES: 217 mg/dL — AB (ref 0–149)
VLDL CHOLESTEROL CAL: 43 mg/dL — AB (ref 5–40)

## 2017-11-03 NOTE — Progress Notes (Signed)
Subjective:    Patient ID: Antonio Guerra, male    DOB: [birth year], 67 y.o.   MRN: [id number]   CC: Follow-up with type 2 diabetes management  HPI: Patient is a 67 year old male who presents today to follow-up on type 2 diabetes management.  Patient was last seen in clinic in follow-up 2018.  A1c was 8.7.  Patient has been taking Lantus 25 units in the morning 20 units at night.  Patient has also been taking glipizide 100 mg daily.  Patient was supposed to work on therapeutic lifestyle changes to improve her A1c.  Daughter reports that father has not been adherent to his regimen due to forgetfulness.  He also has not been working on his diet or able to exercise as previously discussed.  Patient reports that neuropathic pain in his lower extremity has improved with gabapentin.  States that most medication she has checked his blood glucose at home and has been elevated into the 300s.  She has been trying to help manage medications and diabetes.  This has not found time to be used on a regular basis.  Patient is out of like to have home health nurse  to help her parents managed medications.  She reports that father has been fatigued and he sometimes does not get out of bed for the whole day.  He currently denies any chest pain, pain, nausea, vomiting, diarrhea, headache, shortness of breath.  Smoking status reviewed   ROS: all other systems were reviewed and are negative other than in the HPI   Past Medical History:  Diagnosis Date  . Arthritis    "all over" (12/20/2015)  . Chronic lower back pain   . Chronic systolic congestive heart failure ([place])    a. 04/2014 Echo: EF 20-25%.  . Coronary artery disease    a. 2011 MI x 2 with PCI: stent x 2 (LAD and RI) @ [facility] in [place], [place];  b. 04/2014 Cath/PCI: LM 10-20, LAD 40p, 63m, 70m ISR(3.5x38 Xience DES), 50apical, LCX 20 diffuse, RI 50p, 28m ISR, RCA 40-57m, 30d.  . Fibromyalgia   . Hyperlipidemia   . Hypertension   . Hypertensive  urgency 09/27/2015  . Myocardial infarction ([place]) 2015  . Stroke [facility]) 2012   "right hand weaker since" (12/20/2015)  . Type II diabetes mellitus ([place])     Past Surgical History:  Procedure Laterality Date  . ANKLE FRACTURE SURGERY Right   . CARDIAC CATHETERIZATION N/A 09/30/2015   Procedure: Left Heart Cath and Coronary Angiography;  Surgeon: Burnell Blanks, MD;  Location: [facility] CV LAB;  Service: Cardiovascular;  Laterality: N/A;  . CORONARY ANGIOPLASTY WITH STENT PLACEMENT     "total of 3 stents" (12/20/2015)  . FRACTIONAL FLOW RESERVE WIRE  05/11/2014   Procedure: FRACTIONAL FLOW RESERVE WIRE;  Surgeon: Burnell Blanks, MD;  Location: [facility] CATH LAB;  Service: Cardiovascular;;  . FRACTURE SURGERY    . LEFT HEART CATHETERIZATION WITH CORONARY/GRAFT ANGIOGRAM N/A 05/11/2014   Procedure: LEFT HEART CATHETERIZATION WITH Beatrix Fetters;  Surgeon: Burnell Blanks, MD;  Location: [facility] CATH LAB;  Service: Cardiovascular;  Laterality: N/A;  . PERCUTANEOUS CORONARY STENT INTERVENTION (PCI-S)  05/11/2014   Procedure: PERCUTANEOUS CORONARY STENT INTERVENTION (PCI-S);  Surgeon: Burnell Blanks, MD;  Location: [facility] CATH LAB;  Service: Cardiovascular;;    Past medical history, surgical, family, and social history reviewed and updated in the EMR as appropriate.  Objective:  BP 110/62   Pulse 94   Temp 97.9 F (  36.6 C) (Oral)   Wt 180 lb (81.6 kg)   SpO2 98%   BMI 23.75 kg/m   Vitals and nursing note reviewed  General: NAD, pleasant, able to participate in exam Cardiac: RRR, normal heart sounds, no murmurs. 2+ radial and PT pulses bilaterally Respiratory: CTAB, normal effort, No wheezes, rales or rhonchi Abdomen: soft, nontender, nondistended, no hepatic or splenomegaly, +BS Foot exam: Minimal sensation noted during foot exam bilaterally with pinprick and temperature.  Skin is dry but no ulceration, deformity or spring breakdown noted. Extremities: no edema  or cyanosis. WWP. Skin: warm and dry, no rashes noted Neuro: alert and oriented x4, no focal deficits Psych: Normal affect and mood   Assessment & Plan:    Type 2 diabetes mellitus with vascular disease ([place]) Today A1c is 12.3 up from 8.7 back in August 2018.  Results are consistent with lack of medication adherence as well as inadequate diet.  Patient has missed multiple office visit appointment which has made it difficult for me to follow-up on diabetes management.  Foot exam showed severe loss of sensation bilaterally in the lower extremity consistent with worsening disease.  We will have to make adjustment to current regimen with close follow-up in clinic for further medication titration.  Discussed with patient and daughter need to improve glycemic control to avoid micro and macro vascular damage.  Patient creatinine was elevated at last office visit in the setting of uncontrolled diabetes.  Expect worsening kidney function given A1c results today.  I emphasized importance of regular office visit for close monitoring of patient's multiple health issues. --Increase Lantus to 30 units twice daily, may need further titration --Start patient on NovoLog 10 units with 2 biggest meal of the day, will also likely need up titration --Discontinue glipizide to minimize risk of hypoglycemia in the setting of fast acting insulin initiation --Continue gabapentin 600 mg 3 times daily --Check blood  Glucose  in the morning before breakfast, and 2 hours after lunch and dinner. --Order new glucometer with strips --We will order CBC, BMP, lipid panel, --Patient will make an appointment to see Dr. Valentina Lucks in 2 weeks for further medication titration. --Patient will see me 2 weeks later for follow-up. --Refer to podiatry for diabetic foot care --Will address other health maintenance at next office visit  Referral placed for home health nurse does request as she is unable to manage her parents medical issues on  her own anymore. --Ambulatory referral for home health [place], [place] PGY-2

## 2017-11-03 NOTE — Assessment & Plan Note (Addendum)
Today A1c is 12.3 up from 8.7 back in August 2018.  Results are consistent with lack of medication adherence as well as inadequate diet.  Patient has missed multiple office visit appointment which has made it difficult for me to follow-up on diabetes management.  Foot exam showed severe loss of sensation bilaterally in the lower extremity consistent with worsening disease.  We will have to make adjustment to current regimen with close follow-up in clinic for further medication titration.  Discussed with patient and daughter need to improve glycemic control to avoid micro and macro vascular damage.  Patient creatinine was elevated at last office visit in the setting of uncontrolled diabetes.  Expect worsening kidney function given A1c results today.  I emphasized importance of regular office visit for close monitoring of patient's multiple health issues. --Increase Lantus to 30 units twice daily, may need further titration --Start patient on NovoLog 10 units with 2 biggest meal of the day, will also likely need up titration --Discontinue glipizide to minimize risk of hypoglycemia in the setting of fast acting insulin initiation --Continue gabapentin 600 mg 3 times daily --Check blood  Glucose  in the morning before breakfast, and 2 hours after lunch and dinner. --Order new glucometer with strips --We will order CBC, BMP, lipid panel, --Patient will make an appointment to see Dr. Valentina Lucks in 2 weeks for further medication titration. --Patient will see me 2 weeks later for follow-up. --Refer to podiatry for diabetic foot care --Will address other health maintenance at next office visit

## 2017-11-08 ENCOUNTER — Telehealth: Payer: Self-pay | Admitting: Family Medicine

## 2017-11-08 NOTE — Telephone Encounter (Signed)
Pt daughter called wanting to talk to someone about her fathers sliding scale for his diabetes. I asked if she had left a voicemail with the nurses and she said she did 20 minutes ago. I told her if she left them a voicemail they would give her a call back but she asked If I would send a note to the doctor. She would like for someone to contact her ASAP. Pt is having issues with his diabetes since this weekend.

## 2017-11-08 NOTE — Telephone Encounter (Signed)
Spoke with daughter [name] today regarding father's novolog and if he needs a sliding scale.  Patient started checking his glucose on Friday afternoon and his daughter said that it was in the 60-70 range before eating dinner.  She said that on Saturday his fasting glucose was in the 70s and patient was feeling "loopy" and throughout the day his glucose went as high as 300.  She said that she spoke with a niece who also has diabetes and she mentioned that patient may benefit from a sliding scale.  Advised daughter to hold off on giving novolog tonight with dinner since she is concerned out his morning readings and to please check his glucose before he eats and after he eats tonight.  I also asked her to document this for [place] as well as the morning readings and call us tomorrow and let us know that they are.  I advised her that provider will be in clinic tomorrow afternoon and will need the reading to further evaluate.  [name] voiced understanding and will contact our office tomorrow.  Allena Pietila,CMA

## 2017-11-11 ENCOUNTER — Telehealth: Payer: Self-pay

## 2017-11-11 NOTE — Telephone Encounter (Signed)
Pt's daughter [name] calling, would like to speak to Dr. Andy Gauss about getting some resources to help her with caring for both of her parents. Her call back [phone] Wallace Cullens, RN

## 2017-11-12 ENCOUNTER — Other Ambulatory Visit: Payer: Self-pay | Admitting: Family Medicine

## 2017-11-16 NOTE — Telephone Encounter (Signed)
Called daughter and filled application for personal care services with [facility]. Will follow.  Thank you   Marjie Skiff, MD [place], PGY-2

## 2017-11-16 NOTE — Telephone Encounter (Signed)
Called patient daughter and discuss insulin regimen. Patient will follow up with Dr. Valentina Lucks on 5/2 for further adjustment.  Thanks  Marjie Skiff, MD [place], PGY-2

## 2017-11-17 ENCOUNTER — Ambulatory Visit: Payer: Self-pay | Admitting: Podiatry

## 2017-11-18 ENCOUNTER — Encounter: Payer: Self-pay | Admitting: Pharmacist

## 2017-11-18 ENCOUNTER — Ambulatory Visit (INDEPENDENT_AMBULATORY_CARE_PROVIDER_SITE_OTHER): Payer: Medicare Other | Admitting: Pharmacist

## 2017-11-18 DIAGNOSIS — E1159 Type 2 diabetes mellitus with other circulatory complications: Secondary | ICD-10-CM

## 2017-11-18 MED ORDER — DULAGLUTIDE 0.75 MG/0.5ML ~~LOC~~ SOAJ: 0.7500 mg | SUBCUTANEOUS | 1 refills | Status: DC

## 2017-11-18 MED ORDER — INSULIN PEN NEEDLE 32G X 6 MM MISC: 1.0000 | Freq: Two times a day (BID) | 11 refills | Status: DC

## 2017-11-18 MED ORDER — INSULIN GLARGINE 100 UNIT/ML SOLOSTAR PEN: 50.0000 [IU] | PEN_INJECTOR | Freq: Every day | SUBCUTANEOUS | 11 refills | Status: DC

## 2017-11-18 MED ORDER — INSULIN ASPART 100 UNIT/ML FLEXPEN: 10.0000 [IU] | PEN_INJECTOR | Freq: Every day | SUBCUTANEOUS | 11 refills | Status: DC

## 2017-11-18 NOTE — Assessment & Plan Note (Signed)
Diabetes longstanding currently uncontrolled as evidenced by CBG readings and A1C. Patient reports hypoglycemic events and is able to verbalize appropriate hypoglycemia management plan. Patient reports adherence with medication however this is questionable. Control is suboptimal due to questionable adherence, dietary indiscretion, pancreatic insufficiency.  - Decrease Lantus to 50 units once daily. Discussed with daughter - if patient has fasting or nocturnal hypoglycemia, she is to decrease Lantus to 45 units once daily. - Decrease Novolog to 10 units with supper only - Start Trulicity 1.79 mg under the skin once a week. Patient educated on purpose, proper use and potential adverse effects of n/v.  Following instruction patient verbalized understanding of treatment plan.  - D/c Januvia (patient's daughter reports he has been off of this)  - Discussed affordability of CGM. Patient is not on enough insulin injections/day to qualify for medicare coverage of CGM. Discussed cash price. Patient and daughter defer for now. - Discussed pathophysiology of DM, MOA of medications, and treatment for hypoglycemia - Next A1C anticipated 02/01/18 or later

## 2017-11-18 NOTE — Patient Instructions (Addendum)
Thanks for coming to see [place]. It was nice to meet you.   1. Skip Lantus tonight. Start 50 units in the morning once a day tomorrow.  2. Change novolog to 10 units with supper only 3. Start Trulicity 1.58 mg once weekly 4. Test blood sugar first thing in the morning and ~2 hours after supper  Come back to see [place] in 2-4 weeks.

## 2017-11-18 NOTE — Progress Notes (Signed)
S:     Chief Complaint  Patient presents with  . Medication Management    diabetes    Patient arrives in fair spirits, ambulating with limp but no assistance. Patient presents with daughter [name]) and wife.  Presents for diabetes evaluation, education, and management at the request of Dr. Andy Gauss. Patient was referred on 11/02/17.  Patient was last seen by Primary Care Provider on 11/02/17.   Patient reports Diabetes was diagnosed ~10 years ago and has been on insulin for several years. Daughter, [name], is primary care giver and fills pill box, helps check CBGs. [name] provides majority of history during the visit. She states that patient has had several low blood sugars since increasing Lantus and starting Novolog and has self down-titrated to Novolog 3-5 units with meals. She brings in CBG meter for review today. Of note, [name] expresses concern about patient's compliance to medications and risk of hypoglycemia when left at home alone all day. She works during the day and is home in evenings/weekends. Daughter and patient express interest in CGM.   Family/Social History: lives with daughter  Patient reports adherence with medications.  Current diabetes medications include: Lantus 30 units BID, Novolog 3-5 units TID with meals Current hypertension medications include: hydralazine 25 mg daily, isosorbide mononitrate 30 mg daily, ivabradine 5 mg BID, spironolactone 25 mg daily  Patient reports hypoglycemic events. 1 recorded event to 67 mg/dL at 2:07 PM, several in 70s and 80s in mornings and late afternoons.   Patient reported dietary habits: Eats 2-3 meals/day. Supper is largest meal of the day and they eat as a family.  Snacks:fig newtons, chips  O:  Physical Exam  Constitutional: He appears well-developed and well-nourished.   Review of Systems  All other systems reviewed and are negative.   Lab Results  Component Value Date   HGBA1C 12.3 11/02/2017   Vitals:   11/18/17 1540  BP: 114/78  Pulse: 96  SpO2: 94%    CBGs: 80s-230s, excursions to 60s   A/P: Diabetes longstanding currently uncontrolled as evidenced by CBG readings and A1C. Patient reports hypoglycemic events and is able to verbalize appropriate hypoglycemia management plan. Patient reports adherence with medication however this is questionable. Control is suboptimal due to questionable adherence, dietary indiscretion, pancreatic insufficiency.  - Decrease Lantus to 50 units once daily. Discussed with daughter - if patient has fasting or nocturnal hypoglycemia, she is to decrease Lantus to 45 units once daily. - Decrease Novolog to 10 units with supper only - Start Trulicity 9.02 mg under the skin once a week. Patient educated on purpose, proper use and potential adverse effects of n/v.  Following instruction patient verbalized understanding of treatment plan.  - D/c Januvia (patient's daughter reports he has been off of this)  - Discussed affordability of CGM. Patient is not on enough insulin injections/day to qualify for medicare coverage of CGM. Discussed cash price. Patient and daughter defer for now. - Discussed pathophysiology of DM, MOA of medications, and treatment for hypoglycemia - Next A1C anticipated 02/01/18 or later  ASCVD risk greater than 7.5%. Continued Aspirin 81 mg and Continued atorvastatin 80 mg.   Hypertension longstanding currently well controlled.  Patient reports adherence with medication. Continue current medications.  Written patient instructions provided.  Total time in face to face counseling 30 minutes.   Follow up in Pharmacist Clinic Visit 2-4 weeks. Will call [name] in 2 weeks to discuss CBGs [phone]) .   Patient seen with Richardine Service, PharmD Candidate, Jerene Pitch  Baggett, PharmD, PGY1 Pharmacy Resident and Deirdre Pippins, PharmD, BCPS, PGY2 Pharmacy Resident.

## 2017-11-19 NOTE — Progress Notes (Signed)
Patient ID: Antonio Guerra, male   DOB: 07-26-1950, 67 y.o.   MRN: [id number] Reviewed: Agree with Dr. Graylin Shiver documentation and management.

## 2017-12-01 ENCOUNTER — Telehealth: Payer: Self-pay | Admitting: Family Medicine

## 2017-12-01 ENCOUNTER — Ambulatory Visit: Payer: Medicare Other | Admitting: Family Medicine

## 2017-12-01 NOTE — Telephone Encounter (Signed)
Pts daughter would like to talk to dr Greggory Keen. Pt had an appt today but had to cancel because of death in the family.  She didn't want to wait until the next appt June 25.

## 2017-12-01 NOTE — Telephone Encounter (Signed)
Will forward to MD to advise. Jazmin Hartsell,CMA  

## 2017-12-02 ENCOUNTER — Ambulatory Visit: Payer: Medicaid Other | Admitting: Podiatry

## 2017-12-02 ENCOUNTER — Telehealth: Payer: Self-pay | Admitting: Pharmacist

## 2017-12-02 NOTE — Telephone Encounter (Signed)
Called patient's daughter [name]) to follow up on how the patient has been doing since our last Rx Clinic visit. [name] states that he has been doing better. Reports all CBGs have been in 100s. Reports 1 hypoglycemic episode to 67 in late afternoon when he had not eaten all day, treated with OJ. [name] expresses that she suspects he is skipping or dose reducing Lantus some days but she is overall pleased with his blood glucose readings. States that he has not complained of n/v with Trulicity. [name] reports that she has a home health nurse coming in from 10am-2pm starting next week. Denies further questions. Will follow up at upcoming visit on 5/30.     Carlean Jews, Pharm.D., BCPS PGY2 Ambulatory Care Pharmacy Resident Phone: [phone]

## 2017-12-03 NOTE — Telephone Encounter (Signed)
Called and answered all questions patient's daughter had. She will follow up in clinic on 5/30 with Dr. Valentina Lucks.  Marjie Skiff, MD [place], PGY-2

## 2017-12-06 ENCOUNTER — Other Ambulatory Visit: Payer: Self-pay | Admitting: Family Medicine

## 2017-12-16 ENCOUNTER — Ambulatory Visit: Payer: Medicare Other | Admitting: Pharmacist

## 2017-12-16 ENCOUNTER — Encounter: Payer: Self-pay | Admitting: Podiatry

## 2017-12-16 ENCOUNTER — Telehealth: Payer: Self-pay | Admitting: Family Medicine

## 2017-12-16 ENCOUNTER — Ambulatory Visit (INDEPENDENT_AMBULATORY_CARE_PROVIDER_SITE_OTHER): Payer: Medicare Other | Admitting: Podiatry

## 2017-12-16 VITALS — BP 129/80 | HR 97 | Ht 73.0 in | Wt 187.0 lb

## 2017-12-16 DIAGNOSIS — B351 Tinea unguium: Secondary | ICD-10-CM

## 2017-12-16 DIAGNOSIS — M79671 Pain in right foot: Secondary | ICD-10-CM

## 2017-12-16 DIAGNOSIS — M79672 Pain in left foot: Secondary | ICD-10-CM

## 2017-12-16 DIAGNOSIS — E0842 Diabetes mellitus due to underlying condition with diabetic polyneuropathy: Secondary | ICD-10-CM

## 2017-12-16 NOTE — Telephone Encounter (Signed)
Clinical info completed on diabetic shoe form.  Place form in Dr. Carmelina Dane box for completion.  Andreas Newport, [place]

## 2017-12-16 NOTE — Telephone Encounter (Signed)
Prescription to be signed, dropped off for at front desk for completion.  Verified that patient section of form has been completed.  Last DOS/WCC with PCP was 11/02/17.  Placed form in team folder to be completed by clinical staff.  Crista Luria

## 2017-12-16 NOTE — Progress Notes (Signed)
SUBJECTIVE: 67 y.o. year old male presents complaining of painful nails. Patient is diabetic x 15 years. Last blood sugar checked was 95 yesterday. Having numbness on feet duration of a month. Also requesting diabetic shoes.  Review of Systems  Constitutional: Negative.   HENT: Negative.   Eyes: Negative.   Respiratory: Negative.   Cardiovascular:       Has a few stents.  Gastrointestinal: Negative.   Genitourinary: Negative.   Musculoskeletal: Negative for joint pain and myalgias.       Knees and feet hurt x several months.  Skin: Negative.      OBJECTIVE: DERMATOLOGIC EXAMINATION: Extremely thick dystrophic nails x 10.  VASCULAR EXAMINATION OF LOWER LIMBS: All pedal pulses are palpable with normal pulsation.  Capillary Filling times within 3 seconds in all digits.  No edema or erythema noted. Temperature gradient from tibial crest to dorsum of foot is within normal bilateral.  NEUROLOGIC EXAMINATION OF THE LOWER LIMBS: Achilles DTR is present and within normal. Monofilament (Semmes-Weinstein 10-gm) sensory testing positive 3 out of 6, bilateral. Failed to respond on plantar surfaces. Vibratory sensations(128Hz  turning fork) intact at medial and lateral forefoot bilateral.  Sharp and Dull discriminatory sensations at the plantar ball of hallux is intact bilateral.   MUSCULOSKELETAL EXAMINATION: Rectus foot.  ASSESSMENT: Onychomycosis, Onychogryphosis x 10. Painful feet. Diabetic Neuropathy.  PLAN: Reviewed findings and available treatment options. Both feet soaked for dystrophic and thick nails. All nails debrided. May benefit from diabetic shoes.

## 2017-12-16 NOTE — Patient Instructions (Signed)
Seen for hypertrophic nails. All nails debrided. Return in 3 months or as needed.  

## 2017-12-23 NOTE — Telephone Encounter (Signed)
Notes printed and placed with form.  Then placed in to be faxed pile. Lunna Vogelgesang, Salome Spotted, CMA

## 2017-12-28 ENCOUNTER — Telehealth: Payer: Self-pay | Admitting: *Deleted

## 2017-12-28 NOTE — Telephone Encounter (Signed)
Received DM shoe certifying statement from PCP. Called to get patient scheduled and voice mail was full

## 2017-12-30 ENCOUNTER — Other Ambulatory Visit: Payer: Self-pay | Admitting: Family Medicine

## 2017-12-31 ENCOUNTER — Other Ambulatory Visit: Payer: Self-pay | Admitting: Family Medicine

## 2017-12-31 DIAGNOSIS — E1159 Type 2 diabetes mellitus with other circulatory complications: Secondary | ICD-10-CM

## 2018-01-05 ENCOUNTER — Other Ambulatory Visit: Payer: Self-pay | Admitting: Family Medicine

## 2018-01-29 ENCOUNTER — Other Ambulatory Visit: Payer: Self-pay | Admitting: Family Medicine

## 2018-01-29 DIAGNOSIS — I10 Essential (primary) hypertension: Secondary | ICD-10-CM

## 2018-02-04 ENCOUNTER — Other Ambulatory Visit: Payer: Self-pay | Admitting: Family Medicine

## 2018-02-06 ENCOUNTER — Other Ambulatory Visit: Payer: Self-pay | Admitting: Family Medicine

## 2018-02-06 DIAGNOSIS — E1159 Type 2 diabetes mellitus with other circulatory complications: Secondary | ICD-10-CM

## 2018-02-16 ENCOUNTER — Encounter

## 2018-03-09 ENCOUNTER — Other Ambulatory Visit: Payer: Self-pay | Admitting: Family Medicine

## 2018-03-14 ENCOUNTER — Other Ambulatory Visit: Payer: Self-pay | Admitting: Family Medicine

## 2018-03-14 DIAGNOSIS — I10 Essential (primary) hypertension: Secondary | ICD-10-CM

## 2018-03-17 ENCOUNTER — Other Ambulatory Visit: Payer: Self-pay | Admitting: Family Medicine

## 2018-03-17 DIAGNOSIS — E1159 Type 2 diabetes mellitus with other circulatory complications: Secondary | ICD-10-CM

## 2018-03-26 ENCOUNTER — Other Ambulatory Visit: Payer: Self-pay | Admitting: Family Medicine

## 2018-03-26 DIAGNOSIS — I5022 Chronic systolic (congestive) heart failure: Secondary | ICD-10-CM

## 2018-03-29 ENCOUNTER — Ambulatory Visit: Payer: Medicare Other | Admitting: Podiatry

## 2018-04-15 ENCOUNTER — Other Ambulatory Visit: Payer: Self-pay | Admitting: Family Medicine

## 2018-04-15 DIAGNOSIS — E1159 Type 2 diabetes mellitus with other circulatory complications: Secondary | ICD-10-CM

## 2018-04-20 ENCOUNTER — Other Ambulatory Visit: Payer: Self-pay | Admitting: Family Medicine

## 2018-04-20 DIAGNOSIS — E1159 Type 2 diabetes mellitus with other circulatory complications: Secondary | ICD-10-CM

## 2018-05-11 ENCOUNTER — Other Ambulatory Visit: Payer: Self-pay | Admitting: Family Medicine

## 2018-05-15 ENCOUNTER — Other Ambulatory Visit: Payer: Self-pay | Admitting: Family Medicine

## 2018-05-15 DIAGNOSIS — E1159 Type 2 diabetes mellitus with other circulatory complications: Secondary | ICD-10-CM

## 2018-05-17 ENCOUNTER — Other Ambulatory Visit: Payer: Self-pay | Admitting: Family Medicine

## 2018-06-12 ENCOUNTER — Other Ambulatory Visit: Payer: Self-pay | Admitting: Family Medicine

## 2018-06-12 DIAGNOSIS — E1159 Type 2 diabetes mellitus with other circulatory complications: Secondary | ICD-10-CM

## 2018-06-12 DIAGNOSIS — I10 Essential (primary) hypertension: Secondary | ICD-10-CM

## 2018-07-10 ENCOUNTER — Other Ambulatory Visit: Payer: Self-pay | Admitting: Family Medicine

## 2018-07-12 ENCOUNTER — Other Ambulatory Visit: Payer: Self-pay | Admitting: Family Medicine

## 2018-07-12 DIAGNOSIS — E1159 Type 2 diabetes mellitus with other circulatory complications: Secondary | ICD-10-CM

## 2018-07-14 ENCOUNTER — Other Ambulatory Visit: Payer: Self-pay | Admitting: Family Medicine

## 2018-07-16 ENCOUNTER — Other Ambulatory Visit: Payer: Self-pay | Admitting: Family Medicine

## 2018-07-16 DIAGNOSIS — I5022 Chronic systolic (congestive) heart failure: Secondary | ICD-10-CM

## 2018-08-05 ENCOUNTER — Other Ambulatory Visit: Payer: Self-pay | Admitting: Family Medicine

## 2018-08-13 ENCOUNTER — Other Ambulatory Visit: Payer: Self-pay | Admitting: Family Medicine

## 2018-08-21 ENCOUNTER — Other Ambulatory Visit: Payer: Self-pay | Admitting: Family Medicine

## 2018-08-21 DIAGNOSIS — E1159 Type 2 diabetes mellitus with other circulatory complications: Secondary | ICD-10-CM

## 2018-09-06 ENCOUNTER — Other Ambulatory Visit: Payer: Self-pay

## 2018-09-06 MED ORDER — DULAGLUTIDE 0.75 MG/0.5ML ~~LOC~~ SOAJ: SUBCUTANEOUS | 1 refills | Status: DC

## 2018-09-13 ENCOUNTER — Other Ambulatory Visit: Payer: Self-pay | Admitting: *Deleted

## 2018-09-13 DIAGNOSIS — I10 Essential (primary) hypertension: Secondary | ICD-10-CM

## 2018-09-13 MED ORDER — CARVEDILOL 25 MG PO TABS: ORAL_TABLET | ORAL | 3 refills | Status: DC

## 2018-09-23 ENCOUNTER — Encounter: Payer: Self-pay | Admitting: Family Medicine

## 2018-09-23 ENCOUNTER — Ambulatory Visit (INDEPENDENT_AMBULATORY_CARE_PROVIDER_SITE_OTHER): Payer: Medicare Other | Admitting: Family Medicine

## 2018-09-23 VITALS — BP 122/64 | HR 105 | Temp 98.2°F | Wt 192.5 lb

## 2018-09-23 DIAGNOSIS — Z23 Encounter for immunization: Secondary | ICD-10-CM | POA: Diagnosis not present

## 2018-09-23 DIAGNOSIS — E782 Mixed hyperlipidemia: Secondary | ICD-10-CM

## 2018-09-23 DIAGNOSIS — I639 Cerebral infarction, unspecified: Secondary | ICD-10-CM

## 2018-09-23 DIAGNOSIS — I10 Essential (primary) hypertension: Secondary | ICD-10-CM

## 2018-09-23 DIAGNOSIS — Z1159 Encounter for screening for other viral diseases: Secondary | ICD-10-CM | POA: Insufficient documentation

## 2018-09-23 DIAGNOSIS — E1159 Type 2 diabetes mellitus with other circulatory complications: Secondary | ICD-10-CM

## 2018-09-23 LAB — POCT GLYCOSYLATED HEMOGLOBIN (HGB A1C): HbA1c, POC (controlled diabetic range): 6.9 % (ref 0.0–7.0)

## 2018-09-23 MED ORDER — NITROGLYCERIN 0.4 MG SL SUBL: 0.4000 mg | SUBLINGUAL_TABLET | SUBLINGUAL | 3 refills | Status: DC | PRN

## 2018-09-23 NOTE — Assessment & Plan Note (Addendum)
A1c today 6.9.  Significantly improved from 12.3 a year ago.  Given age patient is at goal.  Will continue current regimen of Lantus 8 units daily, Trulicity 6.85 once a week and NovoLog 10 units with supper as needed.  Continue with gabapentin for peripheral neuropathy.  Referral made for podiatry.  Will work on ordering diabetic shoes. --Follow-up on CBC and BMP --Referral to ophthalmology placed today.

## 2018-09-23 NOTE — Patient Instructions (Addendum)
It was great seeing you today! We have addressed the following issues today  1. Your A1c is 6.9, much improved continue with your current regimen. 2. I am referring you to a foot doctor and eye doctor. 3. Fax me the paper work for extended hours with the aide. 4. I will follow up on the blood work from today.  If we did any lab work today, and the results require attention, either me or my nurse will get in touch with you. If everything is normal, you will get a letter in mail and a message via . If you don't hear from [place] in two weeks, please give [place] a call. Otherwise, we look forward to seeing you again at your next visit. If you have any questions or concerns before then, please call the clinic at [phone].  Please bring all your medications to every doctors visit  Sign up for My Chart to have easy access to your labs results, and communication with your Primary care physician. Please ask Front Desk for some assistance.   Please check-out at the front desk before leaving the clinic.    Take Care,   Dr. Andy Gauss

## 2018-09-23 NOTE — Assessment & Plan Note (Signed)
Patient is currently on 80 mg atorvastatin.  We will repeat lipid panel today.  Recommend following up with cardiology.

## 2018-09-23 NOTE — Assessment & Plan Note (Signed)
Patient with history of stroke in 2012.  Patient also has extensive cardiac history.  Currently on Plavix.

## 2018-09-23 NOTE — Assessment & Plan Note (Signed)
Blood pressure today is 122/64.  Patient is at goal.  No reports of dizziness.  Has not been seen by cardiology in over a year.  Recommend following up with cardiology.  Will continue current regimen given good control.

## 2018-09-23 NOTE — Progress Notes (Signed)
Subjective:    Patient ID: Antonio Guerra, male    DOB: Jul 19, 1951, 68 y.o.   MRN: [id number]   CC: Follow-up for multiple chronic issues.  HPI: Patient is a 68 year old male who past medical history significant for type 2 diabetes, CHF, hyperlipidemia who presents today to follow-up on type 2 diabetes management and other chronic issues.  Patient reports that he has been doing 50 units of Lantus daily as well as Trulicity and NovoLog as needed.  He did not bring glucometer today but reports that blood glucose have been ranging around 100 and morning.  He has had trouble injecting himself lately but seems to be doing significantly better compared to before.  Patient is still using gabapentin for neuropathy.  Daughter reports that he has had difficulty with ambulation.  Patient is still taking Starlix anymore.  Daughter reports that he has not complained of heartburn.  They have been seen by podiatry on multiple occasions last year.  Daughter is requesting diabetic shoes.  Daughter reports being satisfied with current nurse age but is requesting longer hours due to father's increasing need.  Currently it is only present for 2 hours a day.  Mother is unable to care for father, and daughter is busy with work.  Patient currently denies any abdominal pain, shortness of breath, chest pain, headaches, dizziness, fever, nausea.  Smoking status reviewed   ROS: all other systems were reviewed and are negative other than in the HPI   Past Medical History:  Diagnosis Date  . Arthritis    "all over" (12/20/2015)  . Chronic lower back pain   . Chronic systolic congestive heart failure ([place])    a. 04/2014 Echo: EF 20-25%.  . Coronary artery disease    a. 2011 MI x 2 with PCI: stent x 2 (LAD and RI) @ [facility] in [place], [place];  b. 04/2014 Cath/PCI: LM 10-20, LAD 40p, 52m, 102m ISR(3.5x38 Xience DES), 50apical, LCX 20 diffuse, RI 50p, 22m ISR, RCA 40-62m, 30d.  . Fibromyalgia   . Hyperlipidemia   .  Hypertension   . Hypertensive urgency 09/27/2015  . Myocardial infarction ([place]) 2015  . Stroke [facility]) 2012   "right hand weaker since" (12/20/2015)  . Type II diabetes mellitus ([place])     Past Surgical History:  Procedure Laterality Date  . ANKLE FRACTURE SURGERY Right   . CARDIAC CATHETERIZATION N/A 09/30/2015   Procedure: Left Heart Cath and Coronary Angiography;  Surgeon: Burnell Blanks, MD;  Location: [facility];  Service: Cardiovascular;  Laterality: N/A;  . CORONARY ANGIOPLASTY WITH STENT PLACEMENT     "total of 3 stents" (12/20/2015)  . FRACTIONAL FLOW RESERVE WIRE  05/11/2014   Procedure: FRACTIONAL FLOW RESERVE WIRE;  Surgeon: Burnell Blanks, MD;  Location: [facility] CATH LAB;  Service: Cardiovascular;;  . FRACTURE SURGERY    . LEFT HEART CATHETERIZATION WITH CORONARY/GRAFT ANGIOGRAM N/A 05/11/2014   Procedure: LEFT HEART CATHETERIZATION WITH Beatrix Fetters;  Surgeon: Burnell Blanks, MD;  Location: [facility] CATH LAB;  Service: Cardiovascular;  Laterality: N/A;  . PERCUTANEOUS CORONARY STENT INTERVENTION (PCI-S)  05/11/2014   Procedure: PERCUTANEOUS CORONARY STENT INTERVENTION (PCI-S);  Surgeon: Burnell Blanks, MD;  Location: [facility] CATH LAB;  Service: Cardiovascular;;    Past medical history, surgical, family, and social history reviewed and updated in the EMR as appropriate.  Objective:  BP 122/64   Pulse (!) 105   Temp 98.2 F (36.8 C) (Oral)   Wt 192 lb 8 oz (87.3  kg)   SpO2 98%   BMI 25.40 kg/m   Vitals and nursing note reviewed  General: NAD, pleasant, able to participate in exam Cardiac: RRR, normal heart sounds, no murmurs. 2+ radial and PT pulses bilaterally Respiratory: CTAB, normal effort, No wheezes, rales or rhonchi Abdomen: soft, nontender, nondistended, no hepatic or splenomegaly, +BS Extremities: no edema or cyanosis. WWP. Skin: warm and dry, no rashes noted Neuro: alert and oriented x4, no focal deficits Psych: Normal  affect and mood  DM foot exam performed   Assessment & Plan:   Type 2 diabetes mellitus with vascular disease (HCC) A1c today 6.9.  Significantly improved from 12.3 a year ago.  Given age patient is at goal.  Will continue current regimen of Lantus 8 units daily, Trulicity 4.09 once a week and NovoLog 10 units with supper as needed.  Continue with gabapentin for peripheral neuropathy.  Referral made for podiatry.  Will work on ordering diabetic shoes. --Follow-up on CBC and BMP --Referral to ophthalmology placed today.  Uncontrolled hypertension Blood pressure today is 122/64.  Patient is at goal.  No reports of dizziness.  Has not been seen by cardiology in over a year.  Recommend following up with cardiology.  Will continue current regimen given good control.  Hyperlipidemia Patient is currently on 80 mg atorvastatin.  We will repeat lipid panel today.  Recommend following up with cardiology.    Patient received today flu shot and pneumonia (PCV13) Hepatitis C screening   Marjie Skiff, MD [facility] PGY-3

## 2018-09-24 LAB — CBC WITH DIFFERENTIAL/PLATELET
Basophils Absolute: 0 10*3/uL (ref 0.0–0.2)
Basos: 1 %
EOS (ABSOLUTE): 0.1 10*3/uL (ref 0.0–0.4)
Eos: 2 %
Hematocrit: 36.4 % — ABNORMAL LOW (ref 37.5–51.0)
Hemoglobin: 11.9 g/dL — ABNORMAL LOW (ref 13.0–17.7)
IMMATURE GRANS (ABS): 0 10*3/uL (ref 0.0–0.1)
Immature Granulocytes: 0 %
Lymphocytes Absolute: 1.9 10*3/uL (ref 0.7–3.1)
Lymphs: 36 %
MCH: 24.2 pg — ABNORMAL LOW (ref 26.6–33.0)
MCHC: 32.7 g/dL (ref 31.5–35.7)
MCV: 74 fL — ABNORMAL LOW (ref 79–97)
Monocytes Absolute: 0.5 10*3/uL (ref 0.1–0.9)
Monocytes: 10 %
Neutrophils Absolute: 2.7 10*3/uL (ref 1.4–7.0)
Neutrophils: 51 %
Platelets: 263 10*3/uL (ref 150–450)
RBC: 4.91 x10E6/uL (ref 4.14–5.80)
RDW: 15 % (ref 11.6–15.4)
WBC: 5.2 10*3/uL (ref 3.4–10.8)

## 2018-09-24 LAB — BASIC METABOLIC PANEL
BUN / CREAT RATIO: 16 (ref 10–24)
BUN: 27 mg/dL (ref 8–27)
CO2: 21 mmol/L (ref 20–29)
Calcium: 9.3 mg/dL (ref 8.6–10.2)
Chloride: 101 mmol/L (ref 96–106)
Creatinine, Ser: 1.67 mg/dL — ABNORMAL HIGH (ref 0.76–1.27)
GFR calc non Af Amer: 42 mL/min/{1.73_m2} — ABNORMAL LOW (ref >59)
GFR, EST AFRICAN AMERICAN: 49 mL/min/{1.73_m2} — AB (ref >59)
Glucose: 198 mg/dL — ABNORMAL HIGH (ref 65–99)
Potassium: 4.1 mmol/L (ref 3.5–5.2)
SODIUM: 143 mmol/L (ref 134–144)

## 2018-09-24 LAB — LIPID PANEL
Chol/HDL Ratio: 3.8 ratio (ref 0.0–5.0)
Cholesterol, Total: 109 mg/dL (ref 100–199)
HDL: 29 mg/dL — ABNORMAL LOW (ref >39)
LDL Calculated: 18 mg/dL (ref 0–99)
TRIGLYCERIDES: 310 mg/dL — AB (ref 0–149)
VLDL CHOLESTEROL CAL: 62 mg/dL — AB (ref 5–40)

## 2018-09-24 LAB — HEPATITIS C ANTIBODY: Hep C Virus Ab: <0.1 s/co ratio (ref 0.0–0.9)

## 2018-10-21 ENCOUNTER — Other Ambulatory Visit: Payer: Self-pay

## 2018-10-21 ENCOUNTER — Other Ambulatory Visit: Payer: Self-pay | Admitting: Family Medicine

## 2018-10-21 DIAGNOSIS — I5022 Chronic systolic (congestive) heart failure: Secondary | ICD-10-CM

## 2018-10-21 MED ORDER — DULAGLUTIDE 0.75 MG/0.5ML ~~LOC~~ SOAJ: SUBCUTANEOUS | 1 refills | Status: DC

## 2018-11-03 ENCOUNTER — Other Ambulatory Visit: Payer: Self-pay | Admitting: *Deleted

## 2018-11-03 DIAGNOSIS — E1159 Type 2 diabetes mellitus with other circulatory complications: Secondary | ICD-10-CM

## 2018-11-03 MED ORDER — GABAPENTIN 300 MG PO CAPS: ORAL_CAPSULE | ORAL | 3 refills | Status: DC

## 2018-11-15 ENCOUNTER — Other Ambulatory Visit: Payer: Self-pay | Admitting: Family Medicine

## 2018-11-15 DIAGNOSIS — E1159 Type 2 diabetes mellitus with other circulatory complications: Secondary | ICD-10-CM

## 2018-12-22 ENCOUNTER — Other Ambulatory Visit: Payer: Self-pay | Admitting: Family Medicine

## 2018-12-29 ENCOUNTER — Other Ambulatory Visit: Payer: Self-pay

## 2018-12-29 MED ORDER — TRULICITY 0.75 MG/0.5ML ~~LOC~~ SOAJ: SUBCUTANEOUS | 1 refills | Status: DC

## 2018-12-30 ENCOUNTER — Telehealth: Payer: Self-pay

## 2018-12-30 ENCOUNTER — Other Ambulatory Visit: Payer: Self-pay

## 2018-12-30 DIAGNOSIS — E1159 Type 2 diabetes mellitus with other circulatory complications: Secondary | ICD-10-CM

## 2018-12-30 MED ORDER — ONETOUCH VERIO VI STRP: ORAL_STRIP | 12 refills | Status: DC

## 2018-12-30 NOTE — Telephone Encounter (Signed)
[name], patients daughter, calls nurse line requesting for PCP to reach out to [facility] to request more hours for patient for home health. [name] stated, "the PCP should have the contact information," when I asked her.

## 2018-12-30 NOTE — Telephone Encounter (Signed)
Page  Patient daughter should be the one reaching to them requesting more hours. If there is paper work that need to be signed then I would gladly do so. She is the one requesting extra hours not the PCP.   Thanks  Marjie Skiff, MD [place], PGY-3

## 2019-01-16 ENCOUNTER — Other Ambulatory Visit: Payer: Self-pay | Admitting: Family Medicine

## 2019-01-16 ENCOUNTER — Other Ambulatory Visit: Payer: Self-pay | Admitting: *Deleted

## 2019-01-16 MED ORDER — ISOSORBIDE MONONITRATE ER 30 MG PO TB24: ORAL_TABLET | ORAL | 1 refills | Status: DC

## 2019-01-17 ENCOUNTER — Other Ambulatory Visit: Payer: Self-pay

## 2019-01-17 DIAGNOSIS — I5022 Chronic systolic (congestive) heart failure: Secondary | ICD-10-CM

## 2019-01-19 ENCOUNTER — Other Ambulatory Visit: Payer: Self-pay | Admitting: *Deleted

## 2019-01-19 DIAGNOSIS — E1159 Type 2 diabetes mellitus with other circulatory complications: Secondary | ICD-10-CM

## 2019-01-19 DIAGNOSIS — I5022 Chronic systolic (congestive) heart failure: Secondary | ICD-10-CM

## 2019-01-19 MED ORDER — LANTUS SOLOSTAR 100 UNIT/ML ~~LOC~~ SOPN: PEN_INJECTOR | SUBCUTANEOUS | 11 refills | Status: DC

## 2019-01-19 MED ORDER — CLOPIDOGREL BISULFATE 75 MG PO TABS: ORAL_TABLET | ORAL | 2 refills | Status: DC

## 2019-01-19 MED ORDER — TRULICITY 0.75 MG/0.5ML ~~LOC~~ SOAJ: SUBCUTANEOUS | 6 refills | Status: DC

## 2019-01-19 MED ORDER — ISOSORBIDE MONONITRATE ER 30 MG PO TB24: ORAL_TABLET | ORAL | 1 refills | Status: DC

## 2019-01-19 MED ORDER — FUROSEMIDE 40 MG PO TABS: 40.0000 mg | ORAL_TABLET | Freq: Every day | ORAL | 2 refills | Status: DC

## 2019-01-25 ENCOUNTER — Telehealth: Payer: Self-pay | Admitting: *Deleted

## 2019-01-25 NOTE — Telephone Encounter (Signed)
[name] is requesting that we fill out the [facility] form to allow dad to get more hours from his aide.    I started to fill this form and realized that pt needs a face to face in the past 3 months.   His last appt was in March.  In the setting of covid I think a telemedicine visit will count.   LMOVM for michelle to call back.  Form placed in MDs box.  Christen Bame, CMA

## 2019-01-26 NOTE — Telephone Encounter (Signed)
Will fill out form when patient makes an apt. Will leave form in box until then.

## 2019-01-30 ENCOUNTER — Telehealth (INDEPENDENT_AMBULATORY_CARE_PROVIDER_SITE_OTHER): Payer: Medicare Other | Admitting: Family Medicine

## 2019-01-30 ENCOUNTER — Other Ambulatory Visit: Payer: Self-pay

## 2019-01-30 DIAGNOSIS — I639 Cerebral infarction, unspecified: Secondary | ICD-10-CM | POA: Diagnosis not present

## 2019-01-30 NOTE — Progress Notes (Signed)
[facility] Telemedicine Visit  Patient consented to have virtual visit. Method of visit: Telephone  Encounter participants: Patient: Tadd Holtmeyer - located at home Provider: Bonnita Hollow - located at office Others (if applicable): Daughter, Nani Gasser, primary caregiver Chief Complaint: h/o stroke  HPI:  Patient with a history of stroke.  Has limited capacity to to perform ADLs and a IADLs including bathing, self hygiene, toileting, medication administration.  Family currently has home RN come in for 2 hours, but patient needs further assistance especially with checking CBGs at home and an injectable medication administration.  Patient has home health assessment form.  This is in hand.  Will complete form as well as order home health order.    ROS: per HPI  Pertinent PMHx: History of CVA, diabetes mellitus  Exam:  Unable to assess because spoke with daughter regarding patient's history  Assessment/Plan: Home health orders placed.  Form completed as requested.  Will give to RN pool to fax.  Time spent during visit with patient:6 minutes

## 2019-02-06 NOTE — Telephone Encounter (Signed)
Form faxed to [facility] @ [phone].  Copy placed in batch scanning.   Original in my office, in purple nurse "to do" folder  . Christen Bame, CMA

## 2019-02-07 ENCOUNTER — Ambulatory Visit: Payer: Medicare Other

## 2019-03-15 DIAGNOSIS — E113312 Type 2 diabetes mellitus with moderate nonproliferative diabetic retinopathy with macular edema, left eye: Secondary | ICD-10-CM | POA: Diagnosis not present

## 2019-03-15 LAB — HM DIABETES EYE EXAM

## 2019-03-20 ENCOUNTER — Encounter (INDEPENDENT_AMBULATORY_CARE_PROVIDER_SITE_OTHER): Payer: Medicare Other | Admitting: Ophthalmology

## 2019-03-28 ENCOUNTER — Other Ambulatory Visit: Payer: Self-pay

## 2019-03-28 MED ORDER — ATORVASTATIN CALCIUM 80 MG PO TABS: 80.0000 mg | ORAL_TABLET | Freq: Every day | ORAL | 0 refills | Status: DC

## 2019-03-29 ENCOUNTER — Encounter (INDEPENDENT_AMBULATORY_CARE_PROVIDER_SITE_OTHER): Payer: Medicare Other | Admitting: Ophthalmology

## 2019-03-29 ENCOUNTER — Other Ambulatory Visit: Payer: Self-pay

## 2019-03-29 DIAGNOSIS — E113513 Type 2 diabetes mellitus with proliferative diabetic retinopathy with macular edema, bilateral: Secondary | ICD-10-CM

## 2019-03-29 DIAGNOSIS — H43813 Vitreous degeneration, bilateral: Secondary | ICD-10-CM

## 2019-03-29 DIAGNOSIS — H35033 Hypertensive retinopathy, bilateral: Secondary | ICD-10-CM

## 2019-03-29 DIAGNOSIS — H2513 Age-related nuclear cataract, bilateral: Secondary | ICD-10-CM

## 2019-03-29 DIAGNOSIS — E11311 Type 2 diabetes mellitus with unspecified diabetic retinopathy with macular edema: Secondary | ICD-10-CM

## 2019-03-29 DIAGNOSIS — I10 Essential (primary) hypertension: Secondary | ICD-10-CM

## 2019-04-26 ENCOUNTER — Other Ambulatory Visit: Payer: Self-pay

## 2019-04-26 ENCOUNTER — Encounter (INDEPENDENT_AMBULATORY_CARE_PROVIDER_SITE_OTHER): Payer: Medicare Other | Admitting: Ophthalmology

## 2019-04-26 DIAGNOSIS — E11311 Type 2 diabetes mellitus with unspecified diabetic retinopathy with macular edema: Secondary | ICD-10-CM | POA: Diagnosis not present

## 2019-04-26 DIAGNOSIS — E113513 Type 2 diabetes mellitus with proliferative diabetic retinopathy with macular edema, bilateral: Secondary | ICD-10-CM

## 2019-04-26 DIAGNOSIS — H35033 Hypertensive retinopathy, bilateral: Secondary | ICD-10-CM | POA: Diagnosis not present

## 2019-04-26 DIAGNOSIS — H43813 Vitreous degeneration, bilateral: Secondary | ICD-10-CM

## 2019-04-26 DIAGNOSIS — I10 Essential (primary) hypertension: Secondary | ICD-10-CM | POA: Diagnosis not present

## 2019-05-04 ENCOUNTER — Encounter (INDEPENDENT_AMBULATORY_CARE_PROVIDER_SITE_OTHER): Payer: Medicare Other | Admitting: Ophthalmology

## 2019-05-04 DIAGNOSIS — E113511 Type 2 diabetes mellitus with proliferative diabetic retinopathy with macular edema, right eye: Secondary | ICD-10-CM | POA: Diagnosis not present

## 2019-05-04 DIAGNOSIS — E11311 Type 2 diabetes mellitus with unspecified diabetic retinopathy with macular edema: Secondary | ICD-10-CM

## 2019-05-17 ENCOUNTER — Encounter: Payer: Self-pay | Admitting: Family Medicine

## 2019-05-25 ENCOUNTER — Encounter (INDEPENDENT_AMBULATORY_CARE_PROVIDER_SITE_OTHER): Payer: Medicare Other | Admitting: Ophthalmology

## 2019-05-25 DIAGNOSIS — H43813 Vitreous degeneration, bilateral: Secondary | ICD-10-CM | POA: Diagnosis not present

## 2019-05-25 DIAGNOSIS — I10 Essential (primary) hypertension: Secondary | ICD-10-CM

## 2019-05-25 DIAGNOSIS — H35033 Hypertensive retinopathy, bilateral: Secondary | ICD-10-CM | POA: Diagnosis not present

## 2019-05-25 DIAGNOSIS — E11311 Type 2 diabetes mellitus with unspecified diabetic retinopathy with macular edema: Secondary | ICD-10-CM | POA: Diagnosis not present

## 2019-05-25 DIAGNOSIS — E113513 Type 2 diabetes mellitus with proliferative diabetic retinopathy with macular edema, bilateral: Secondary | ICD-10-CM

## 2019-06-02 ENCOUNTER — Telehealth: Payer: Self-pay

## 2019-06-02 ENCOUNTER — Other Ambulatory Visit: Payer: Self-pay

## 2019-06-02 ENCOUNTER — Encounter: Payer: Self-pay | Admitting: Podiatry

## 2019-06-02 ENCOUNTER — Ambulatory Visit (INDEPENDENT_AMBULATORY_CARE_PROVIDER_SITE_OTHER): Payer: Medicare Other | Admitting: Podiatry

## 2019-06-02 VITALS — BP 145/81 | HR 96

## 2019-06-02 DIAGNOSIS — E1142 Type 2 diabetes mellitus with diabetic polyneuropathy: Secondary | ICD-10-CM | POA: Diagnosis not present

## 2019-06-02 DIAGNOSIS — B351 Tinea unguium: Secondary | ICD-10-CM | POA: Diagnosis not present

## 2019-06-02 DIAGNOSIS — R460 Very low level of personal hygiene: Secondary | ICD-10-CM

## 2019-06-02 DIAGNOSIS — L853 Xerosis cutis: Secondary | ICD-10-CM | POA: Diagnosis not present

## 2019-06-02 DIAGNOSIS — Z7409 Other reduced mobility: Secondary | ICD-10-CM

## 2019-06-02 DIAGNOSIS — E1159 Type 2 diabetes mellitus with other circulatory complications: Secondary | ICD-10-CM

## 2019-06-02 DIAGNOSIS — B353 Tinea pedis: Secondary | ICD-10-CM | POA: Diagnosis not present

## 2019-06-02 MED ORDER — AMMONIUM LACTATE 12 % EX LOTN: 1.0000 "application " | TOPICAL_LOTION | Freq: Two times a day (BID) | CUTANEOUS | 4 refills | Status: DC

## 2019-06-02 MED ORDER — CLOTRIMAZOLE-BETAMETHASONE 1-0.05 % EX CREA: TOPICAL_CREAM | CUTANEOUS | 1 refills | Status: DC

## 2019-06-02 NOTE — Telephone Encounter (Signed)
Patients care giver calls nurse line stating a referral needs to be put in for Dr. Elisha Ponder. Dr. Elisha Ponder is writing patients diabetic shoes, however with insurance a referral needs to be put in. Acquanetta Sit practices at [facility] and [facility].

## 2019-06-02 NOTE — Telephone Encounter (Signed)
Referral placed as requested. Will have staff call patient to inform.

## 2019-06-02 NOTE — Patient Instructions (Addendum)
WEEKLY FOOT SOAK INSTRUCTIONS FOR FOOT HYGIENE   1. SOAK FEET IN LUKEWARM SOAPY WATER FOR 10 MINUTES. HAVE A FAMILY MEMBER OR CAREGIVER CHECK THE WATER TEMPERATURE FOR YOU BEFORE SUBMERGING YOUR FEET IN THE WATER.  2.  DRY FEET WELL TAKING CARE TO DRY WELL BETWEEN TOES AND UNDER TOES.  3.  APPLY MOISTURIZING CREAM TO FEET AVOIDING APPLICATION BETWEEN TOES.    Diabetes Mellitus and Foot Care Foot care is an important part of your health, especially when you have diabetes. Diabetes may cause you to have problems because of poor blood flow (circulation) to your feet and legs, which can cause your skin to:  Become thinner and drier.  Break more easily.  Heal more slowly.  Peel and crack. You may also have nerve damage (neuropathy) in your legs and feet, causing decreased feeling in them. This means that you may not notice minor injuries to your feet that could lead to more serious problems. Noticing and addressing any potential problems early is the best way to prevent future foot problems. How to care for your feet Foot hygiene  Wash your feet daily with warm water and mild soap. Do not use hot water. Then, pat your feet and the areas between your toes until they are completely dry. Do not soak your feet as this can dry your skin.  Trim your toenails straight across. Do not dig under them or around the cuticle. File the edges of your nails with an emery board or nail file.  Apply a moisturizing lotion or petroleum jelly to the skin on your feet and to dry, brittle toenails. Use lotion that does not contain alcohol and is unscented. Do not apply lotion between your toes. Shoes and socks  Wear clean socks or stockings every day. Make sure they are not too tight. Do not wear knee-high stockings since they may decrease blood flow to your legs.  Wear shoes that fit properly and have enough cushioning. Always look in your shoes before you put them on to be sure there are no objects inside.   To break in new shoes, wear them for just a few hours a day. This prevents injuries on your feet. Wounds, scrapes, corns, and calluses  Check your feet daily for blisters, cuts, bruises, sores, and redness. If you cannot see the bottom of your feet, use a mirror or ask someone for help.  Do not cut corns or calluses or try to remove them with medicine.  If you find a minor scrape, cut, or break in the skin on your feet, keep it and the skin around it clean and dry. You may clean these areas with mild soap and water. Do not clean the area with peroxide, alcohol, or iodine.  If you have a wound, scrape, corn, or callus on your foot, look at it several times a day to make sure it is healing and not infected. Check for: ? Redness, swelling, or pain. ? Fluid or blood. ? Warmth. ? Pus or a bad smell. General instructions  Do not cross your legs. This may decrease blood flow to your feet.  Do not use heating pads or hot water bottles on your feet. They may burn your skin. If you have lost feeling in your feet or legs, you may not know this is happening until it is too late.  Protect your feet from hot and cold by wearing shoes, such as at the beach or on hot pavement.  Schedule a complete foot  exam at least once a year (annually) or more often if you have foot problems. If you have foot problems, report any cuts, sores, or bruises to your health care provider immediately. Contact a health care provider if:  You have a medical condition that increases your risk of infection and you have any cuts, sores, or bruises on your feet.  You have an injury that is not healing.  You have redness on your legs or feet.  You feel burning or tingling in your legs or feet.  You have pain or cramps in your legs and feet.  Your legs or feet are numb.  Your feet always feel cold.  You have pain around a toenail. Get help right away if:  You have a wound, scrape, corn, or callus on your foot and:  ? You have pain, swelling, or redness that gets worse. ? You have fluid or blood coming from the wound, scrape, corn, or callus. ? Your wound, scrape, corn, or callus feels warm to the touch. ? You have pus or a bad smell coming from the wound, scrape, corn, or callus. ? You have a fever. ? You have a red line going up your leg. Summary  Check your feet every day for cuts, sores, red spots, swelling, and blisters.  Moisturize feet and legs daily.  Wear shoes that fit properly and have enough cushioning.  If you have foot problems, report any cuts, sores, or bruises to your health care provider immediately.  Schedule a complete foot exam at least once a year (annually) or more often if you have foot problems. This information is not intended to replace advice given to you by your health care provider. Make sure you discuss any questions you have with your health care provider. Document Released: 07/03/2000 Document Revised: 08/18/2017 Document Reviewed: 08/07/2016 Elsevier Patient Education  [place]  Athlete's foot (tinea pedis) is a fungal infection of the skin on your feet. It often occurs on the skin that is between or underneath the toes. It can also occur on the soles of your feet. The infection can spread from person to person (is contagious). It can also spread when a person's bare feet come in contact with the fungus on shower floors or on items such as shoes. What are the causes? This condition is caused by a fungus that grows in warm, moist places. You can get athlete's foot by sharing shoes, shower stalls, towels, and wet floors with someone who is infected. Not washing your feet or changing your socks often enough can also lead to athlete's foot. What increases the risk? This condition is more likely to develop in:  Men.  People who have a weak body defense system (immune system).  People who have diabetes.  People who use public showers,  such as at a gym.  People who wear heavy-duty shoes, such as Environmental manager.  Seasons with warm, humid weather. What are the signs or symptoms? Symptoms of this condition include:  Itchy areas between your toes or on the soles of your feet.  White, flaky, or scaly areas between your toes or on the soles of your feet.  Very itchy small blisters between your toes or on the soles of your feet.  Small cuts in your skin. These cuts can become infected.  Thick or discolored toenails. How is this diagnosed? This condition may be diagnosed with a physical exam and a review of your medical history.  Your health care provider may also take a skin or toenail sample to examine under a microscope. How is this treated? This condition is treated with antifungal medicines. These may be applied as powders, ointments, or creams. In severe cases, an oral antifungal medicine may be given. Follow these instructions at home: Medicines  Apply or take over-the-counter and prescription medicines only as told by your health care provider.  Apply your antifungal medicine as told by your health care provider. Do not stop using the antifungal even if your condition improves. Foot care  Do not scratch your feet.  Keep your feet dry: ? Wear cotton or wool socks. Change your socks every day or if they become wet. ? Wear shoes that allow air to flow, such as sandals or canvas tennis shoes.  Wash and dry your feet, including the area between your toes. Also, wash and dry your feet: ? Every day or as told by your health care provider. ? After exercising. General instructions  Do not let others use towels, shoes, nail clippers, or other personal items that touch your feet.  Protect your feet by wearing sandals in wet areas, such as locker rooms and shared showers.  Keep all follow-up visits as told by your health care provider. This is important.  If you have diabetes, keep your blood sugar  under control. Contact a health care provider if:  You have a fever.  You have swelling, soreness, warmth, or redness in your foot.  Your feet are not getting better with treatment.  Your symptoms get worse.  You have new symptoms. Summary  Athlete's foot (tinea pedis) is a fungal infection of the skin on your feet. It often occurs on skin that is between or underneath the toes.  This condition is caused by a fungus that grows in warm, moist places.  Symptoms include white, flaky, or scaly areas between your toes or on the soles of your feet.  This condition is treated with antifungal medicines.  Keep your feet clean. Always dry them thoroughly. This information is not intended to replace advice given to you by your health care provider. Make sure you discuss any questions you have with your health care provider. Document Released: 07/03/2000 Document Revised: 07/01/2017 Document Reviewed: 04/26/2017 Elsevier Patient Education  2020 [employer].

## 2019-06-05 NOTE — Telephone Encounter (Signed)
Attempted to reach pt. No answer LVM. Informing pt of note that referral was place with Dr. Elisha Ponder. Salvatore Marvel, CMA

## 2019-06-08 ENCOUNTER — Encounter (INDEPENDENT_AMBULATORY_CARE_PROVIDER_SITE_OTHER): Payer: Medicare Other | Admitting: Ophthalmology

## 2019-06-08 ENCOUNTER — Telehealth: Payer: Self-pay

## 2019-06-08 DIAGNOSIS — E113512 Type 2 diabetes mellitus with proliferative diabetic retinopathy with macular edema, left eye: Secondary | ICD-10-CM

## 2019-06-08 DIAGNOSIS — E11311 Type 2 diabetes mellitus with unspecified diabetic retinopathy with macular edema: Secondary | ICD-10-CM

## 2019-06-08 NOTE — Addendum Note (Signed)
Addended by: Valerie Roys on: 06/08/2019 02:51 PM   Modules accepted: Orders

## 2019-06-08 NOTE — Telephone Encounter (Signed)
Prescriptions have been written. Will have staff call patient back to inform.

## 2019-06-08 NOTE — Telephone Encounter (Signed)
Daughter called to let Dr. Grandville Silos know that they dont't need a referral they need Rx's for a shower chair and DM shoes. Please inform daughter when this has been done. Ottis Stain, CMA

## 2019-06-08 NOTE — Progress Notes (Signed)
Subjective: Antonio Guerra presents today referred by Bonnita Hollow, MD for diabetic foot evaluation.  Daughter is present during the visit. She states at times her Dad does not let her or other family members touch his feet.  Patient relates 25 year history of diabetes.  Patient denies any history of foot wounds.  Patient relates positive h/o neuropathy for which he takes gabapentin.    Today, patient c/o of painful, discolored, thick toenails which interfere with daily activities.  Pain is aggravated when wearing enclosed shoe gear.   Past Medical History:  Diagnosis Date  . Arthritis    "all over" (12/20/2015)  . Chronic lower back pain   . Chronic systolic congestive heart failure ([place])    a. 04/2014 Echo: EF 20-25%.  . Coronary artery disease    a. 2011 MI x 2 with PCI: stent x 2 (LAD and RI) @ [facility] in [place], [place];  b. 04/2014 Cath/PCI: LM 10-20, LAD 40p, 28m 861mSR(3.5x38 Xience DES), 50apical, LCX 20 diffuse, RI 50p, 2060mR, RCA 40-62m71md.  . Fibromyalgia   . Hyperlipidemia   . Hypertension   . Hypertensive urgency 09/27/2015  . Myocardial infarction (HCC)[place]15  [facility]   "right hand weaker since" (12/20/2015)  . [facility]  Patient Active Problem List   Diagnosis Date Noted  . Need for hepatitis C screening test 09/23/2018  . Need for pneumococcal vaccination 09/23/2018  . Encounter for immunization 09/23/2018  . Altered mental status   . 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 Alcohol intoxication (HCC)[place]/07/2015  . Substance-induced anxiety disorder (HCC)[place]/07/2015  . Epigastric pain   . Non compliance w medication regimen   . Elevated troponin   . Type 2 diabetes mellitus (HCC)[place]/06/2016  . Hypertensive urgency 09/27/2015  . Acute on chronic combined systolic (congestive) and diastolic (congestive) heart failure (HCC)[place]/04/2016  . Ischemic cardiomyopathy   . Hyperlipidemia   .  Coronary artery disease   . Chronic systolic congestive heart failure (HCC)[place]. Unstable angina (HCC)[place]/23/2015  . Stroke (HCC)[place]. Uncontrolled hypertension   . Diabetes mellitus without complication (HCC)[place]. Precordial chest pain 05/09/2014    Past Surgical History:  Procedure Laterality Date  . ANKLE FRACTURE SURGERY Right   . CARDIAC CATHETERIZATION N/A 09/30/2015   Procedure: Left Heart Cath and Coronary Angiography;  Surgeon: ChriBurnell Blanks;  [facility];  Service: Cardiovascular;  Laterality: N/A;  . CORONARY ANGIOPLASTY WITH STENT PLACEMENT     "total of 3 stents" (12/20/2015)  . FRACTIONAL FLOW RESERVE WIRE  05/11/2014   Procedure: FRACTIONAL FLOW RESERVE WIRE;  Surgeon: ChriBurnell Blanks;  [facility];  Service: Cardiovascular;;  . FRACTURE SURGERY    . LEFT HEART CATHETERIZATION WITH CORONARY/GRAFT ANGIOGRAM N/A 05/11/2014   Procedure: LEFT HEART CATHETERIZATION WITH COROBeatrix Fettersurgeon: ChriBurnell Blanks;  [facility];  Service: Cardiovascular;  Laterality: N/A;  . PERCUTANEOUS CORONARY STENT INTERVENTION (PCI-S)  05/11/2014   Procedure: PERCUTANEOUS CORONARY STENT INTERVENTION (PCI-S);  Surgeon: ChriBurnell Blanks;  [facility];  Service: Cardiovascular;;    Current Outpatient Medications on File Prior to Visit  Medication Sig Dispense Refill  . aspirin EC 81 MG tablet Take 81 mg by mouth daily.    . atMarland Kitchenrvastatin (LIPITOR) 80 MG tablet Take 1 tablet (80  mg total) by mouth at bedtime. 90 tablet 0  . blood glucose meter kit and supplies Dispense based on patient and insurance preference. Use up to four times daily as directed. (FOR ICD-9 250.00, 250.01). 1 each 0  . Blood Glucose Monitoring Suppl (ONETOUCH VERIO) w/Device KIT 30 Units by Does not apply route daily. 1 kit 0  . carvedilol (COREG) 25 MG tablet TAKE 1 TABLET(25 MG) BY MOUTH TWICE DAILY 180 tablet 3  .  Cholecalciferol (VITAMIN D PO) Take 1 tablet by mouth daily.    . clopidogrel (PLAVIX) 75 MG tablet TAKE 1 TABLET(75 MG) BY MOUTH DAILY 90 tablet 2  . Dulaglutide (TRULICITY) 8.31 DV/7.6HY SOPN INJECT 0.'75MG'$  INTO THE SKIN ONCE A WEEK 2 mL 6  . folic acid (FOLVITE) 1 MG tablet Take 1 mg by mouth daily.    . furosemide (LASIX) 40 MG tablet Take 1 tablet (40 mg total) by mouth daily. 90 tablet 2  . gabapentin (NEURONTIN) 300 MG capsule TAKE 1 CAPSULE(300 MG) BY MOUTH THREE TIMES DAILY 90 capsule 3  . glucose blood (ONETOUCH VERIO) test strip Use as instructed 100 each 12  . hydrALAZINE (APRESOLINE) 25 MG tablet Take 1 tablet (25 mg total) by mouth every 8 (eight) hours. (Patient taking differently: Take 25 mg by mouth daily. ) 90 tablet 6  . insulin aspart (NOVOLOG FLEXPEN) 100 UNIT/ML FlexPen Inject 10 Units into the skin daily with supper. 15 mL 11  . Insulin Glargine (LANTUS SOLOSTAR) 100 UNIT/ML Solostar Pen ADMINISTER 30 UNITS UNDER THE SKIN TWICE DAILY 15 mL 11  . Insulin Pen Needle (BD PEN NEEDLE MICRO U/F) 32G X 6 MM MISC 1 each by Does not apply route 2 (two) times daily. 100 each 11  . isosorbide mononitrate (IMDUR) 30 MG 24 hr tablet TAKE 1 TABLET(30 MG) BY MOUTH DAILY 90 tablet 1  . ivabradine (CORLANOR) 5 MG TABS tablet Take 1 tablet (5 mg total) by mouth 2 (two) times daily with a meal. 60 tablet 6  . Lancet Device MISC To be use with glucometer 1 each 0  . Lancets (ONETOUCH ULTRASOFT) lancets USE AS DIRECTED 100 each 0  . nitroGLYCERIN (NITROSTAT) 0.4 MG SL tablet Place 1 tablet (0.4 mg total) under the tongue every 5 (five) minutes as needed for chest pain. 25 tablet 3  . pantoprazole (PROTONIX) 40 MG tablet Take 1 tablet (40 mg total) by mouth daily. (Patient taking differently: Take 40 mg by mouth daily as needed (HEARTBURN). ) 30 tablet 0  . prednisoLONE acetate (PRED FORTE) 1 % ophthalmic suspension SHAKE LQ AND INT 1 GTT IN OD QID FOR 2 WKS    . RESTASIS 0.05 % ophthalmic emulsion  INT 1 GTT IN OU BID    . spironolactone (ALDACTONE) 25 MG tablet Take 1 tablet (25 mg total) by mouth daily. 30 tablet 6  . sucralfate (CARAFATE) 1 g tablet Take 1 tablet (1 g total) by mouth 4 (four) times daily -  with meals and at bedtime. (Patient taking differently: Take 1 g by mouth daily. ) 28 tablet 0  . trimethoprim-polymyxin b (POLYTRIM) ophthalmic solution INT 1 GTT OU QID FOR 2 DAYS AFTER EACH MONTHLY EYE INJECTION    . [DISCONTINUED] furosemide (LASIX) 40 MG tablet TAKE 1 TABLET BY MOUTH EVERY DAY 90 tablet 0   No current facility-administered medications on file prior to visit.      Allergies  Allergen Reactions  . Tape Itching and Rash    Social History  Occupational History  . Not on file  Tobacco Use  . Smoking status: Former Research scientist (life sciences)  . Smokeless tobacco: Never Used  . Tobacco comment: 12/20/2015 "smoked cigarettes 30-48yrago"  Substance and Sexual Activity  . Alcohol use: Yes    Alcohol/week: 5.0 standard drinks    Types: 5 Cans of beer per week  . Drug use: No  . Sexual activity: Not Currently    Family History  Problem Relation Age of Onset  . Diabetes Mother   . CAD Neg Hx     Immunization History  Administered Date(s) Administered  . Influenza, High Dose Seasonal PF 09/23/2018  . Influenza,inj,Quad PF,6+ Mos 09/28/2015  . Pneumococcal Conjugate-13 09/23/2018    Review of systems: Positive Findings in bold print.  Constitutional:  chills, fatigue, fever, sweats, weight change Communication: tOptometrist sign lEcologist hand writing, iPad/Android device Head: headaches, head injury Eyes: changes in vision, eye pain, glaucoma, cataracts, macular degeneration, diplopia, glare,  light sensitivity, eyeglasses or contacts, blindness Ears nose mouth throat: hearing impaired, hearing aids,  ringing in ears, deaf, sign language,  vertigo, nosebleeds,  rhinitis,  cold sores, snoring, swollen glands Cardiovascular: HTN, edema, arrhythmia, pacemaker  in place, defibrillator in place, chest pain/tightness, chronic anticoagulation, blood clot, heart failure, MI Peripheral Vascular: leg cramps, varicose veins, blood clots, lymphedema, varicosities Respiratory:  difficulty breathing, denies congestion, SOB, wheezing, cough, emphysema Gastrointestinal: change in appetite or weight, abdominal pain, constipation, diarrhea, nausea, vomiting, vomiting blood, change in bowel habits, abdominal pain, jaundice, rectal bleeding, hemorrhoids, GERD Genitourinary:  nocturia,  pain on urination, polyuria,  blood in urine, Foley catheter, urinary urgency, ESRD on hemodialysis Musculoskeletal: amputation, cramping, stiff joints, painful joints, decreased joint motion, fractures, OA, gout, hemiplegia, paraplegia, uses cane, wheelchair bound, uses walker, uses rollator Skin: +changes in toenails, color change, dryness, itching, mole changes,  rash, wound(s) Neurological: headaches, numbness in feet, paresthesias in feet, burning in feet, fainting,  seizures, change in speech,  headaches, memory problems/poor historian, cerebral palsy, weakness, paralysis, CVA, TIA Endocrine: diabetes, hypothyroidism, hyperthyroidism,  goiter, dry mouth, flushing, heat intolerance,  cold intolerance,  excessive thirst, denies polyuria,  nocturia Hematological:  easy bleeding, excessive bleeding, easy bruising, enlarged lymph nodes, on long term blood thinner, history of past transusions Allergy/immunological:  hives, eczema, frequent infections, multiple drug allergies, seasonal allergies, transplant recipient, multiple food allergies Psychiatric:  anxiety, depression, mood disorder, suicidal ideations, hallucinations, insomnia  Objective: Vitals:   06/02/19 1022  BP: (!) 145/81  Pulse: 96   Vascular Examination: Capillary refill time < 3 seconds b/l.  Dorsalis pedis and Posterior tibial pulses are faintly palpable b/l.  Digital hair absent b/l.   Skin temperature gradient  WNL b/l.  Dermatological Examination: Skin with normal turgor, texture and tone b/l.  Interdigital debris noted between toes and under toes, malodorous, consistent with poor pedal hygiene.  Diffuse scaling noted peripherally and plantarly b/l feet with mild foot odor.  No interdigital macerations.  No blisters, no weeping. No signs of secondary bacterial infection noted.  Toenails 1-5 b/l discolored, thick, dystrophic with subungual debris and pain with palpation to nailbeds due to thickness of nails.  Musculoskeletal: Muscle strength 5/5 to all LE muscle groups b/l.  Neurological: Sensation decreased b/l with 10 gram monofilament.  Vibratory sensation decreased b/l.   Assessment: 1. Painful onychomycosis toenails 1-5 b/l  2. Tinea pedis b/l 3. Poor pedal hygiene b/l 4. NIDDM with neuropathy  Plan: 1. Discussed diabetic foot care principles. Patient was encouraged to let his family  assist him with pedal hygiene. He agreed.  Literature dispensed on today. Discussed weekly foot soaks for hygiene purposes. Written instructions provided. For xerosis, prescription for AmLactin lotion 12% was written.  Patient is to apply to both feet twice a day avoiding application in between the toes. 2. Toenails 1-5 b/l were debrided in length and girth without iatrogenic bleeding.  3. Rx was sent for Lotrisone Cream to be applied to both feet and between toes bid for 4 weeks. 4. Patient to continue soft, supportive shoe gear 5. Patient to report any pedal injuries to medical professional immediately. 6. Follow up 3 months.  7. Patient/POA to call should there be a concern in the interim.

## 2019-06-08 NOTE — Addendum Note (Signed)
Addended by: Josephine Igo B on: 06/08/2019 01:30 PM   Modules accepted: Orders

## 2019-06-21 NOTE — Telephone Encounter (Signed)
Error

## 2019-06-22 ENCOUNTER — Encounter (INDEPENDENT_AMBULATORY_CARE_PROVIDER_SITE_OTHER): Payer: Medicare Other | Admitting: Ophthalmology

## 2019-06-24 ENCOUNTER — Other Ambulatory Visit: Payer: Self-pay | Admitting: Family Medicine

## 2019-07-03 ENCOUNTER — Encounter (INDEPENDENT_AMBULATORY_CARE_PROVIDER_SITE_OTHER): Payer: Medicare Other | Admitting: Ophthalmology

## 2019-07-03 ENCOUNTER — Other Ambulatory Visit: Payer: Self-pay

## 2019-07-03 DIAGNOSIS — E113513 Type 2 diabetes mellitus with proliferative diabetic retinopathy with macular edema, bilateral: Secondary | ICD-10-CM | POA: Diagnosis not present

## 2019-07-03 DIAGNOSIS — H2513 Age-related nuclear cataract, bilateral: Secondary | ICD-10-CM

## 2019-07-03 DIAGNOSIS — H35033 Hypertensive retinopathy, bilateral: Secondary | ICD-10-CM

## 2019-07-03 DIAGNOSIS — H43813 Vitreous degeneration, bilateral: Secondary | ICD-10-CM

## 2019-07-03 DIAGNOSIS — I10 Essential (primary) hypertension: Secondary | ICD-10-CM | POA: Diagnosis not present

## 2019-07-03 DIAGNOSIS — E11311 Type 2 diabetes mellitus with unspecified diabetic retinopathy with macular edema: Secondary | ICD-10-CM

## 2019-07-05 ENCOUNTER — Other Ambulatory Visit: Payer: Self-pay | Admitting: *Deleted

## 2019-07-05 DIAGNOSIS — E1159 Type 2 diabetes mellitus with other circulatory complications: Secondary | ICD-10-CM

## 2019-07-05 MED ORDER — GABAPENTIN 300 MG PO CAPS: ORAL_CAPSULE | ORAL | 3 refills | Status: DC

## 2019-07-27 ENCOUNTER — Other Ambulatory Visit: Payer: Self-pay | Admitting: Family Medicine

## 2019-07-29 MED ORDER — TRULICITY 0.75 MG/0.5ML ~~LOC~~ SOAJ: SUBCUTANEOUS | 6 refills | Status: DC

## 2019-08-03 ENCOUNTER — Encounter (INDEPENDENT_AMBULATORY_CARE_PROVIDER_SITE_OTHER): Payer: Medicare Other | Admitting: Ophthalmology

## 2019-08-03 DIAGNOSIS — E113513 Type 2 diabetes mellitus with proliferative diabetic retinopathy with macular edema, bilateral: Secondary | ICD-10-CM | POA: Diagnosis not present

## 2019-08-03 DIAGNOSIS — H43813 Vitreous degeneration, bilateral: Secondary | ICD-10-CM | POA: Diagnosis not present

## 2019-08-03 DIAGNOSIS — H35033 Hypertensive retinopathy, bilateral: Secondary | ICD-10-CM | POA: Diagnosis not present

## 2019-08-03 DIAGNOSIS — E11311 Type 2 diabetes mellitus with unspecified diabetic retinopathy with macular edema: Secondary | ICD-10-CM | POA: Diagnosis not present

## 2019-08-03 DIAGNOSIS — I10 Essential (primary) hypertension: Secondary | ICD-10-CM

## 2019-08-03 DIAGNOSIS — H2513 Age-related nuclear cataract, bilateral: Secondary | ICD-10-CM | POA: Diagnosis not present

## 2019-08-08 ENCOUNTER — Ambulatory Visit: Payer: Medicare Other | Admitting: Family Medicine

## 2019-08-21 ENCOUNTER — Ambulatory Visit: Payer: Medicare Other | Admitting: Family Medicine

## 2019-08-31 ENCOUNTER — Other Ambulatory Visit: Payer: Self-pay

## 2019-08-31 ENCOUNTER — Encounter (INDEPENDENT_AMBULATORY_CARE_PROVIDER_SITE_OTHER): Payer: Medicare Other | Admitting: Ophthalmology

## 2019-08-31 DIAGNOSIS — H43813 Vitreous degeneration, bilateral: Secondary | ICD-10-CM

## 2019-08-31 DIAGNOSIS — E11311 Type 2 diabetes mellitus with unspecified diabetic retinopathy with macular edema: Secondary | ICD-10-CM

## 2019-08-31 DIAGNOSIS — I10 Essential (primary) hypertension: Secondary | ICD-10-CM

## 2019-08-31 DIAGNOSIS — H35033 Hypertensive retinopathy, bilateral: Secondary | ICD-10-CM | POA: Diagnosis not present

## 2019-08-31 DIAGNOSIS — E113513 Type 2 diabetes mellitus with proliferative diabetic retinopathy with macular edema, bilateral: Secondary | ICD-10-CM

## 2019-09-01 ENCOUNTER — Encounter: Payer: Self-pay | Admitting: Podiatry

## 2019-09-01 ENCOUNTER — Ambulatory Visit (INDEPENDENT_AMBULATORY_CARE_PROVIDER_SITE_OTHER): Payer: Medicare Other | Admitting: Podiatry

## 2019-09-01 DIAGNOSIS — B351 Tinea unguium: Secondary | ICD-10-CM

## 2019-09-01 NOTE — Progress Notes (Signed)
Subjective: Antonio Guerra presents today for follow up of preventative diabetic foot care and painful mycotic nails b/l that are difficult to trim. Pain interferes with ambulation. Aggravating factors include wearing enclosed shoe gear. Pain is relieved with periodic professional debridement.   Patient states he's not sure if daughter picked up his prescription for AmLactin Lotion.  Allergies  Allergen Reactions  . Tape Itching and Rash     Objective: There were no vitals filed for this visit.  Vascular Examination:  Capillary fill time to digits <3s b/l, faintly palpable pedal pulses b/l, pedal hair absent b/l and skin temperature gradient within normal limits b/l  Dermatological Examination: no open wounds bilaterally, toenails 1-5 b/l elongated, dystrophic, thickened, crumbly with subungual debris, pedal skin noted to be dry and flaky b/l, diffuse scaling noted peripherally and plantarly b/l feet with mild foot odor.  No interdigital macerations.  No blisters, no weeping. No signs of secondary bacterial infection noted and interdigital debris noted between toes, malodorous and evidence of poor pedal hygiene  Musculoskeletal: Normal muscle strength 5/5 to all lower extremity muscle groups bilaterally, no pain crepitus or joint limitation noted with ROM b/l and bunion deformity noted b/l  Neurological: Protective sensation decreased with 10 gram monofilament b/l and vibratory sensation decreased b/l  Assessment: 1. Onychomycosis    Plan: -Continue diabetic foot care principles. Literature dispensed on today.  -Toenails 1-5 b/l were debrided in length and girth without iatrogenic bleeding. -Advised patient to let family wash his feet once weekly and clean between his toes with q-tips once weekly. Apply moisturizer daily. -Patient to continue soft, supportive shoe gear daily. -Patient to report any pedal injuries to medical professional immediately. -Patient/POA to call should there  be question/concern in the interim.  Return in about 3 months (around 11/29/2019) for diabetic nail and callus trim.

## 2019-09-01 NOTE — Patient Instructions (Addendum)
WEEKLY FOOT SOAK INSTRUCTIONS FOR FOOT HYGIENE   1. SOAK FEET IN LUKEWARM SOAPY WATER FOR 10 MINUTES. HAVE A FAMILY MEMBER OR CAREGIVER CHECK THE WATER TEMPERATURE FOR YOU BEFORE SUBMERGING YOUR FEET IN THE WATER.  2.  DRY FEET WELL TAKING CARE TO DRY WELL BETWEEN TOES AND UNDER TOES.  3.  APPLY MOISTURIZING CREAM TO FEET AVOIDING APPLICATION BETWEEN TOES    Diabetes Mellitus and Foot Care Foot care is an important part of your health, especially when you have diabetes. Diabetes may cause you to have problems because of poor blood flow (circulation) to your feet and legs, which can cause your skin to:  Become thinner and drier.  Break more easily.  Heal more slowly.  Peel and crack. You may also have nerve damage (neuropathy) in your legs and feet, causing decreased feeling in them. This means that you may not notice minor injuries to your feet that could lead to more serious problems. Noticing and addressing any potential problems early is the best way to prevent future foot problems. How to care for your feet Foot hygiene  Wash your feet daily with warm water and mild soap. Do not use hot water. Then, pat your feet and the areas between your toes until they are completely dry. Do not soak your feet as this can dry your skin.  Trim your toenails straight across. Do not dig under them or around the cuticle. File the edges of your nails with an emery board or nail file.  Apply a moisturizing lotion or petroleum jelly to the skin on your feet and to dry, brittle toenails. Use lotion that does not contain alcohol and is unscented. Do not apply lotion between your toes. Shoes and socks  Wear clean socks or stockings every day. Make sure they are not too tight. Do not wear knee-high stockings since they may decrease blood flow to your legs.  Wear shoes that fit properly and have enough cushioning. Always look in your shoes before you put them on to be sure there are no objects  inside.  To break in new shoes, wear them for just a few hours a day. This prevents injuries on your feet. Wounds, scrapes, corns, and calluses  Check your feet daily for blisters, cuts, bruises, sores, and redness. If you cannot see the bottom of your feet, use a mirror or ask someone for help.  Do not cut corns or calluses or try to remove them with medicine.  If you find a minor scrape, cut, or break in the skin on your feet, keep it and the skin around it clean and dry. You may clean these areas with mild soap and water. Do not clean the area with peroxide, alcohol, or iodine.  If you have a wound, scrape, corn, or callus on your foot, look at it several times a day to make sure it is healing and not infected. Check for: ? Redness, swelling, or pain. ? Fluid or blood. ? Warmth. ? Pus or a bad smell. General instructions  Do not cross your legs. This may decrease blood flow to your feet.  Do not use heating pads or hot water bottles on your feet. They may burn your skin. If you have lost feeling in your feet or legs, you may not know this is happening until it is too late.  Protect your feet from hot and cold by wearing shoes, such as at the beach or on hot pavement.  Schedule a complete foot   exam at least once a year (annually) or more often if you have foot problems. If you have foot problems, report any cuts, sores, or bruises to your health care provider immediately. Contact a health care provider if:  You have a medical condition that increases your risk of infection and you have any cuts, sores, or bruises on your feet.  You have an injury that is not healing.  You have redness on your legs or feet.  You feel burning or tingling in your legs or feet.  You have pain or cramps in your legs and feet.  Your legs or feet are numb.  Your feet always feel cold.  You have pain around a toenail. Get help right away if:  You have a wound, scrape, corn, or callus on your  foot and: ? You have pain, swelling, or redness that gets worse. ? You have fluid or blood coming from the wound, scrape, corn, or callus. ? Your wound, scrape, corn, or callus feels warm to the touch. ? You have pus or a bad smell coming from the wound, scrape, corn, or callus. ? You have a fever. ? You have a red line going up your leg. Summary  Check your feet every day for cuts, sores, red spots, swelling, and blisters.  Moisturize feet and legs daily.  Wear shoes that fit properly and have enough cushioning.  If you have foot problems, report any cuts, sores, or bruises to your health care provider immediately.  Schedule a complete foot exam at least once a year (annually) or more often if you have foot problems. This information is not intended to replace advice given to you by your health care provider. Make sure you discuss any questions you have with your health care provider. Document Revised: 03/29/2019 Document Reviewed: 08/07/2016 Elsevier Patient Education  2020 Elsevier Inc.  

## 2019-09-22 ENCOUNTER — Other Ambulatory Visit: Payer: Self-pay | Admitting: Family Medicine

## 2019-10-05 ENCOUNTER — Encounter (INDEPENDENT_AMBULATORY_CARE_PROVIDER_SITE_OTHER): Payer: Medicare Other | Admitting: Ophthalmology

## 2019-10-05 DIAGNOSIS — E11311 Type 2 diabetes mellitus with unspecified diabetic retinopathy with macular edema: Secondary | ICD-10-CM

## 2019-10-05 DIAGNOSIS — H35033 Hypertensive retinopathy, bilateral: Secondary | ICD-10-CM | POA: Diagnosis not present

## 2019-10-05 DIAGNOSIS — I10 Essential (primary) hypertension: Secondary | ICD-10-CM | POA: Diagnosis not present

## 2019-10-05 DIAGNOSIS — H43813 Vitreous degeneration, bilateral: Secondary | ICD-10-CM

## 2019-10-05 DIAGNOSIS — E113513 Type 2 diabetes mellitus with proliferative diabetic retinopathy with macular edema, bilateral: Secondary | ICD-10-CM

## 2019-10-16 ENCOUNTER — Other Ambulatory Visit: Payer: Self-pay | Admitting: Family Medicine

## 2019-10-16 DIAGNOSIS — I5022 Chronic systolic (congestive) heart failure: Secondary | ICD-10-CM

## 2019-10-17 ENCOUNTER — Other Ambulatory Visit: Payer: Self-pay | Admitting: *Deleted

## 2019-10-17 DIAGNOSIS — I10 Essential (primary) hypertension: Secondary | ICD-10-CM

## 2019-10-18 ENCOUNTER — Other Ambulatory Visit: Payer: Self-pay | Admitting: Student in an Organized Health Care Education/Training Program

## 2019-10-18 ENCOUNTER — Other Ambulatory Visit: Payer: Self-pay | Admitting: Family Medicine

## 2019-10-18 DIAGNOSIS — I10 Essential (primary) hypertension: Secondary | ICD-10-CM

## 2019-10-18 MED ORDER — CARVEDILOL 25 MG PO TABS: ORAL_TABLET | ORAL | 0 refills | Status: DC

## 2019-10-23 NOTE — Telephone Encounter (Signed)
Has an appt on 4/21. Salvatore Marvel, CMA

## 2019-11-01 ENCOUNTER — Ambulatory Visit: Payer: Medicare Other | Admitting: Family Medicine

## 2019-11-02 ENCOUNTER — Other Ambulatory Visit: Payer: Self-pay

## 2019-11-02 ENCOUNTER — Encounter (INDEPENDENT_AMBULATORY_CARE_PROVIDER_SITE_OTHER): Payer: Medicare Other | Admitting: Ophthalmology

## 2019-11-02 DIAGNOSIS — H43813 Vitreous degeneration, bilateral: Secondary | ICD-10-CM | POA: Diagnosis not present

## 2019-11-02 DIAGNOSIS — E11311 Type 2 diabetes mellitus with unspecified diabetic retinopathy with macular edema: Secondary | ICD-10-CM

## 2019-11-02 DIAGNOSIS — H35033 Hypertensive retinopathy, bilateral: Secondary | ICD-10-CM | POA: Diagnosis not present

## 2019-11-02 DIAGNOSIS — E113513 Type 2 diabetes mellitus with proliferative diabetic retinopathy with macular edema, bilateral: Secondary | ICD-10-CM

## 2019-11-02 DIAGNOSIS — I10 Essential (primary) hypertension: Secondary | ICD-10-CM | POA: Diagnosis not present

## 2019-11-05 ENCOUNTER — Other Ambulatory Visit: Payer: Self-pay | Admitting: Family Medicine

## 2019-11-05 DIAGNOSIS — E1159 Type 2 diabetes mellitus with other circulatory complications: Secondary | ICD-10-CM

## 2019-11-07 ENCOUNTER — Telehealth: Payer: Self-pay

## 2019-11-07 NOTE — Telephone Encounter (Signed)
Merrilee Seashore from pharmacy calls nurse line requesting clarification on gabapentin order.   New order written for patient to take 1-400 mg capsule, twice daily. Order also states for patient to take 1-300 mg capsule three times daily.   Please clarify the correct dosage signature for rx.   To PCP  Talbot Grumbling, RN

## 2019-11-07 NOTE — Telephone Encounter (Signed)
Called and spoke with Merrilee Seashore at pharmacy and clarified below information.   Talbot Grumbling, RN

## 2019-11-07 NOTE — Telephone Encounter (Signed)
I received a message from Epic that the 300 mg pills were no longer covered by insurance. I switched to an alternative dosing for this reason.

## 2019-11-08 ENCOUNTER — Encounter: Payer: Self-pay | Admitting: Family Medicine

## 2019-11-08 ENCOUNTER — Other Ambulatory Visit: Payer: Self-pay

## 2019-11-08 ENCOUNTER — Telehealth: Payer: Self-pay

## 2019-11-08 ENCOUNTER — Ambulatory Visit (INDEPENDENT_AMBULATORY_CARE_PROVIDER_SITE_OTHER): Payer: Medicare Other | Admitting: Family Medicine

## 2019-11-08 VITALS — BP 128/62 | HR 100 | Ht 73.0 in | Wt 197.0 lb

## 2019-11-08 DIAGNOSIS — Z794 Long term (current) use of insulin: Secondary | ICD-10-CM | POA: Diagnosis not present

## 2019-11-08 DIAGNOSIS — Z1211 Encounter for screening for malignant neoplasm of colon: Secondary | ICD-10-CM | POA: Insufficient documentation

## 2019-11-08 DIAGNOSIS — E782 Mixed hyperlipidemia: Secondary | ICD-10-CM | POA: Diagnosis not present

## 2019-11-08 DIAGNOSIS — N1831 Chronic kidney disease, stage 3a: Secondary | ICD-10-CM | POA: Insufficient documentation

## 2019-11-08 DIAGNOSIS — R197 Diarrhea, unspecified: Secondary | ICD-10-CM

## 2019-11-08 DIAGNOSIS — Z8679 Personal history of other diseases of the circulatory system: Secondary | ICD-10-CM | POA: Insufficient documentation

## 2019-11-08 DIAGNOSIS — E1121 Type 2 diabetes mellitus with diabetic nephropathy: Secondary | ICD-10-CM | POA: Diagnosis not present

## 2019-11-08 DIAGNOSIS — E1159 Type 2 diabetes mellitus with other circulatory complications: Secondary | ICD-10-CM | POA: Diagnosis not present

## 2019-11-08 DIAGNOSIS — I255 Ischemic cardiomyopathy: Secondary | ICD-10-CM

## 2019-11-08 DIAGNOSIS — Z0000 Encounter for general adult medical examination without abnormal findings: Secondary | ICD-10-CM

## 2019-11-08 LAB — POCT GLYCOSYLATED HEMOGLOBIN (HGB A1C): HbA1c, POC (controlled diabetic range): 6.3 % (ref 0.0–7.0)

## 2019-11-08 MED ORDER — NITROGLYCERIN 0.4 MG SL SUBL: 0.4000 mg | SUBLINGUAL_TABLET | SUBLINGUAL | 3 refills | Status: DC | PRN

## 2019-11-08 MED ORDER — FIBER FORMULA PO CAPS: 1.0000 | ORAL_CAPSULE | Freq: Every morning | ORAL | 11 refills | Status: DC

## 2019-11-08 MED ORDER — FREESTYLE LIBRE 14 DAY READER DEVI: 1.0000 | Freq: Every morning | 3 refills | Status: AC

## 2019-11-08 MED ORDER — LANTUS SOLOSTAR 100 UNIT/ML ~~LOC~~ SOPN: 40.0000 [IU] | PEN_INJECTOR | Freq: Every day | SUBCUTANEOUS | 11 refills | Status: DC

## 2019-11-08 MED ORDER — DEPEND ADJUSTABLE UNDERWEAR LG MISC: 1.0000 "application " | 3 refills | Status: DC | PRN

## 2019-11-08 NOTE — Assessment & Plan Note (Signed)
Without abdominal pain or gross weight loss or melena -Depends adult diaper -Trial of stool bulk letter fiber -Follow-up in 1 month to discuss further

## 2019-11-08 NOTE — Assessment & Plan Note (Signed)
Check renal function. Consider adding low-dose ARB and perhaps titrating off for lowering spironolactone dose

## 2019-11-08 NOTE — Assessment & Plan Note (Signed)
Decrease insulin to 40 units a day Follow-up in 1 month checking fasting sugars in the a.m.

## 2019-11-08 NOTE — Assessment & Plan Note (Signed)
Patient interested in continuous glucose monitoring due to difficulty of sticking fingers.  Device ordered.

## 2019-11-08 NOTE — Assessment & Plan Note (Signed)
Recommend follow-up with cardiology, referral placed

## 2019-11-08 NOTE — Patient Instructions (Addendum)
Decrease insulin to 40 units once a day.  Please follow-up in 1 month to check your morning blood sugars.  We are starting on fiber for his intermittent diarrhea.  Please get your Covid shot.  We are requesting records for your colonoscopy.  We are checking your kidneys and cholesterol today.  Please follow-up with your cardiologist as you have not seen them in over a year.  We are ordering the continuous glucose monitoring unit.

## 2019-11-08 NOTE — Telephone Encounter (Signed)
Pharmacy calls nurse line regarding clarification of insulin dosage. Instructions were sent in for 40 units once daily as well as 30 units twice daily. Spoke with Dr. Grandville Silos who confirmed that patient is to be receiving 40 units once daily. Called back pharmacy and gave Alinda Sierras verbal order for 40 units once daily.   Talbot Grumbling, RN

## 2019-11-08 NOTE — Assessment & Plan Note (Signed)
On high intensity statin.  Repeat lipid panel

## 2019-11-08 NOTE — Progress Notes (Signed)
    SUBJECTIVE:   CHIEF COMPLAINT / HPI:   Patient presents for annual physical  History of ischemic heart disease and heart failure Has not followed up with cardiology in 1 year.  Denies chest pain.  Would like a refill on his nitroglycerin.  Patient taking beta-blocker, spironolactone, nitrate/hydralazine.  He is not on ACE or ARB.  Denies lower extremity swelling.  Patient does take daily aspirin.  Is also on Plavix due to history of stroke.  Is on high intensity statin.  Diabetes mellitus type 2 Hemoglobin A1c checked today.  Below goal.  Patient currently taking 50 units of Lantus daily, Trulicity.  Patient is not on ACE or ARB for renal protection.  Blood pressure is at goal  Health maintenance Patient needs screening for colon cancer  Stage III CKD Patient on multiple nephrotoxic agents including furosemide, spironolactone for heart failure.  Intermittent diarrhea, chronic Patient presents with intermittent diarrhea.  Predominantly present is soft stools with occasional fecal incontinence.  Denies blood in the stool.  Denies abdominal pain.  Weight has increased by about 5 pounds since last year.  Tolerating p.o. well.  PERTINENT  PMH / PSH:  As above  OBJECTIVE:   BP 128/62   Pulse 100   Ht 6\' 1"  (1.854 m)   Wt 197 lb (89.4 kg)   SpO2 99%   BMI 25.99 kg/m   Gen: NAD, resting comfortably CV: RRR with no murmurs appreciated Pulm: NWOB, CTAB with no crackles, wheezes, or rhonchi GI: Normal bowel sounds present. Soft, Nontender, Nondistended. MSK: no edema, cyanosis, or clubbing noted Skin: warm, dry Neuro: grossly normal, moves all extremities Psych: Normal affect and thought content   ASSESSMENT/PLAN:   Ischemic cardiomyopathy Recommend follow-up with cardiology, referral placed  Type 2 diabetes mellitus with vascular disease ([place]) Decrease insulin to 40 units a day Follow-up in 1 month checking fasting sugars in the a.m.  Stage 3a chronic kidney  disease Check renal function. Consider adding low-dose ARB and perhaps titrating off for lowering spironolactone dose  Intermittent diarrhea Without abdominal pain or gross weight loss or melena -Depends adult diaper -Trial of stool bulk letter fiber -Follow-up in 1 month to discuss further   Hyperlipidemia On high intensity statin.  Repeat lipid panel  Type 2 diabetes mellitus ([place]) Patient interested in continuous glucose monitoring due to difficulty of sticking fingers.  Device ordered.     Antonio Hollow, MD [place]

## 2019-11-09 ENCOUNTER — Encounter: Payer: Self-pay | Admitting: Family Medicine

## 2019-11-09 LAB — BASIC METABOLIC PANEL
BUN/Creatinine Ratio: 15 (ref 10–24)
BUN: 23 mg/dL (ref 8–27)
CO2: 23 mmol/L (ref 20–29)
Calcium: 9.3 mg/dL (ref 8.6–10.2)
Chloride: 107 mmol/L — ABNORMAL HIGH (ref 96–106)
Creatinine, Ser: 1.52 mg/dL — ABNORMAL HIGH (ref 0.76–1.27)
GFR calc Af Amer: 54 mL/min/{1.73_m2} — ABNORMAL LOW (ref >59)
GFR calc non Af Amer: 47 mL/min/{1.73_m2} — ABNORMAL LOW (ref >59)
Glucose: 80 mg/dL (ref 65–99)
Potassium: 4.8 mmol/L (ref 3.5–5.2)
Sodium: 142 mmol/L (ref 134–144)

## 2019-11-30 ENCOUNTER — Encounter (INDEPENDENT_AMBULATORY_CARE_PROVIDER_SITE_OTHER): Payer: Medicare Other | Admitting: Ophthalmology

## 2019-11-30 ENCOUNTER — Other Ambulatory Visit: Payer: Self-pay

## 2019-11-30 DIAGNOSIS — E113512 Type 2 diabetes mellitus with proliferative diabetic retinopathy with macular edema, left eye: Secondary | ICD-10-CM | POA: Diagnosis not present

## 2019-11-30 DIAGNOSIS — H35033 Hypertensive retinopathy, bilateral: Secondary | ICD-10-CM

## 2019-11-30 DIAGNOSIS — E11311 Type 2 diabetes mellitus with unspecified diabetic retinopathy with macular edema: Secondary | ICD-10-CM | POA: Diagnosis not present

## 2019-11-30 DIAGNOSIS — H43813 Vitreous degeneration, bilateral: Secondary | ICD-10-CM | POA: Diagnosis not present

## 2019-11-30 DIAGNOSIS — I10 Essential (primary) hypertension: Secondary | ICD-10-CM

## 2019-11-30 DIAGNOSIS — E113591 Type 2 diabetes mellitus with proliferative diabetic retinopathy without macular edema, right eye: Secondary | ICD-10-CM

## 2019-12-05 ENCOUNTER — Other Ambulatory Visit: Payer: Self-pay | Admitting: Student in an Organized Health Care Education/Training Program

## 2019-12-05 DIAGNOSIS — I10 Essential (primary) hypertension: Secondary | ICD-10-CM

## 2019-12-08 ENCOUNTER — Ambulatory Visit: Payer: Medicare Other | Admitting: Podiatry

## 2019-12-11 ENCOUNTER — Encounter: Payer: Self-pay | Admitting: Physician Assistant

## 2019-12-12 ENCOUNTER — Other Ambulatory Visit: Payer: Self-pay

## 2019-12-12 ENCOUNTER — Ambulatory Visit (INDEPENDENT_AMBULATORY_CARE_PROVIDER_SITE_OTHER): Payer: Medicare Other | Admitting: Cardiology

## 2019-12-12 ENCOUNTER — Encounter (INDEPENDENT_AMBULATORY_CARE_PROVIDER_SITE_OTHER): Payer: Self-pay

## 2019-12-12 ENCOUNTER — Encounter: Payer: Self-pay | Admitting: Cardiology

## 2019-12-12 VITALS — BP 90/70 | HR 100 | Ht 73.0 in | Wt 193.8 lb

## 2019-12-12 DIAGNOSIS — Z79899 Other long term (current) drug therapy: Secondary | ICD-10-CM | POA: Diagnosis not present

## 2019-12-12 DIAGNOSIS — I255 Ischemic cardiomyopathy: Secondary | ICD-10-CM | POA: Diagnosis not present

## 2019-12-12 DIAGNOSIS — I5022 Chronic systolic (congestive) heart failure: Secondary | ICD-10-CM

## 2019-12-12 DIAGNOSIS — E785 Hyperlipidemia, unspecified: Secondary | ICD-10-CM | POA: Diagnosis not present

## 2019-12-12 NOTE — Progress Notes (Signed)
Cardiology Office Note:    Date:  12/12/2019   ID:  Antonio Guerra, DOB 1950/12/27, MRN [id number]  PCP:  Bonnita Hollow, MD  Cardiologist:  Candee Furbish, MD  Electrophysiologist:  None   Referring MD: Martyn Malay, MD     History of Present Illness:    Antonio Guerra is a 69 y.o. male here for the evaluation of angina at the request of Dr. Josephine Igo.  Is been quite sometime since he has seen cardiology.  According to his primary care physician's note he is on Plavix because of history of stroke as well as CAD.  Has diabetes not at goal on insulin.  Intermittent diarrhea and chronic kidney disease stage III.  Overall he seems to be doing quite well.  Blood pressure is a bit soft but he is feeling well.   Past Medical History:  Diagnosis Date  . Arthritis    "all over" (12/20/2015)  . Chronic lower back pain   . Chronic systolic congestive heart failure ([place])    a. 04/2014 Echo: EF 20-25%.  . Coronary artery disease    a. 2011 MI x 2 with PCI: stent x 2 (LAD and RI) @ [facility] in [place], [place];  b. 04/2014 Cath/PCI: LM 10-20, LAD 40p, 48m 87mSR(3.5x38 Xience DES), 50apical, LCX 20 diffuse, RI 50p, 2029mR, RCA 40-21m39md.  . Fibromyalgia   . Hyperlipidemia   . Hypertension   . Hypertensive urgency 09/27/2015  . Myocardial infarction ([facility]CC)[place]  [facility]   "right hand weaker since" (12/20/2015)  . Type II diabetes mellitus (HCC)[place]  Past Surgical History:  Procedure Laterality Date  . ANKLE FRACTURE SURGERY Right   . CARDIAC CATHETERIZATION N/A 09/30/2015   Procedure: Left Heart Cath and Coronary Angiography;  Surgeon: ChriBurnell Blanks;  [facility];  Service: Cardiovascular;  Laterality: N/A;  . CORONARY ANGIOPLASTY WITH STENT PLACEMENT     "total of 3 stents" (12/20/2015)  . FRACTIONAL FLOW RESERVE WIRE  05/11/2014   Procedure: FRACTIONAL FLOW RESERVE WIRE;  Surgeon: ChriBurnell Blanks;  [facility];   Service: Cardiovascular;;  . FRACTURE SURGERY    . LEFT HEART CATHETERIZATION WITH CORONARY/GRAFT ANGIOGRAM N/A 05/11/2014   Procedure: LEFT HEART CATHETERIZATION WITH COROBeatrix Fettersurgeon: ChriBurnell Blanks;  [facility];  Service: Cardiovascular;  Laterality: N/A;  . PERCUTANEOUS CORONARY STENT INTERVENTION (PCI-S)  05/11/2014   Procedure: PERCUTANEOUS CORONARY STENT INTERVENTION (PCI-S);  Surgeon: ChriBurnell Blanks;  [facility];  Service: Cardiovascular;;    Current Medications: Current Meds  Medication Sig  . ammonium lactate (AMLACTIN) 12 % lotion Apply 1 application topically 2 (two) times daily.  . asMarland Kitchenirin EC 81 MG tablet Take 81 mg by mouth daily.  . atMarland Kitchenrvastatin (LIPITOR) 80 MG tablet TAKE 1 TABLET(80 MG) BY MOUTH AT BEDTIME  . BD PEN NEEDLE NANO U/F 32G X 4 MM MISC AS DIRECTED TWICE DAILY  . blood glucose meter kit and supplies Dispense based on patient and insurance preference. Use up to four times daily as directed. (FOR ICD-9 250.00, 250.01).  . Blood Glucose Monitoring Suppl (ONETOUCH VERIO) w/Device KIT 30 Units by Does not apply route daily.  . carvedilol (COREG) 25 MG tablet TAKE 1 TABLET(25 MG) BY MOUTH TWICE DAILY  . Cholecalciferol (VITAMIN D PO) Take 1 tablet by mouth daily.  . clopidogrel (PLAVIX) 75 MG tablet TAKE 1 TABLET(75 MG)  BY MOUTH DAILY  . clotrimazole-betamethasone (LOTRISONE) cream Apply to both feet and between toes bid x 4 weeks.  . Continuous Blood Gluc Receiver (FREESTYLE LIBRE 14 DAY READER) DEVI 1 Device by Does not apply route every morning.  . Dulaglutide (TRULICITY) 2.87 OM/7.6HM SOPN INJECT 0.75MG INTO THE SKIN ONCE A WEEK  . Fiber Formula CAPS Take 1 tablet by mouth every morning.  . furosemide (LASIX) 40 MG tablet TAKE 1 TABLET(40 MG) BY MOUTH DAILY  . gabapentin (NEURONTIN) 400 MG capsule Take 1 capsule (400 mg total) by mouth 2 (two) times daily. TAKE 1 CAPSULE(300 MG) BY MOUTH THREE TIMES  DAILY  . glucose blood (ONETOUCH VERIO) test strip Use as instructed  . hydrALAZINE (APRESOLINE) 25 MG tablet Take 1 tablet (25 mg total) by mouth every 8 (eight) hours.  . Incontinence Supply Disposable (DEPEND ADJUSTABLE UNDERWEAR LG) MISC 1 application by Does not apply route as needed.  . insulin glargine (LANTUS SOLOSTAR) 100 UNIT/ML Solostar Pen Inject 40 Units into the skin daily. ADMINISTER 30 UNITS UNDER THE SKIN TWICE DAILY  . isosorbide mononitrate (IMDUR) 30 MG 24 hr tablet TAKE 1 TABLET(30 MG) BY MOUTH DAILY  . ivabradine (CORLANOR) 5 MG TABS tablet Take 1 tablet (5 mg total) by mouth 2 (two) times daily with a meal.  . Lancet Device MISC To be use with glucometer  . Lancets (ONETOUCH ULTRASOFT) lancets USE AS DIRECTED  . nitroGLYCERIN (NITROSTAT) 0.4 MG SL tablet Place 1 tablet (0.4 mg total) under the tongue every 5 (five) minutes as needed for chest pain.  . pantoprazole (PROTONIX) 40 MG tablet Take 1 tablet (40 mg total) by mouth daily.  . prednisoLONE acetate (PRED FORTE) 1 % ophthalmic suspension SHAKE LQ AND INT 1 GTT IN OD QID FOR 2 WKS  . RESTASIS 0.05 % ophthalmic emulsion INT 1 GTT IN OU BID  . spironolactone (ALDACTONE) 25 MG tablet Take 1 tablet (25 mg total) by mouth daily.  . sucralfate (CARAFATE) 1 g tablet Take 1 tablet (1 g total) by mouth 4 (four) times daily -  with meals and at bedtime.  Marland Kitchen trimethoprim-polymyxin b (POLYTRIM) ophthalmic solution INT 1 GTT OU QID FOR 2 DAYS AFTER EACH MONTHLY EYE INJECTION     Allergies:   Tape   Social History   Socioeconomic History  . Marital status: Married    Spouse name: Not on file  . Number of children: Not on file  . Years of education: Not on file  . Highest education level: Not on file  Occupational History  . Not on file  Tobacco Use  . Smoking status: Former Research scientist (life sciences)  . Smokeless tobacco: Never Used  . Tobacco comment: 12/20/2015 "smoked cigarettes 30-32yrago"  Substance and Sexual Activity  . Alcohol use:  Yes    Alcohol/week: 5.0 standard drinks    Types: 5 Cans of beer per week  . Drug use: No  . Sexual activity: Not Currently  Other Topics Concern  . Not on file  Social History Narrative  . Not on file   Social Determinants of Health   Financial Resource Strain:   . Difficulty of Paying Living Expenses:   Food Insecurity:   . Worried About RCharity fundraiserin the Last Year:   . RArboriculturistin the Last Year:   Transportation Needs:   . LFilm/video editor(Medical):   .Marland KitchenLack of Transportation (Non-Medical):   Physical Activity:   . Days of Exercise per  Week:   . Minutes of Exercise per Session:   Stress:   . Feeling of Stress :   Social Connections:   . Frequency of Communication with Friends and Family:   . Frequency of Social Gatherings with Friends and Family:   . Attends Religious Services:   . Active Member of Clubs or Organizations:   . Attends Archivist Meetings:   Marland Kitchen Marital Status:      Family History: The patient's family history includes Diabetes in his mother. There is no history of CAD.  ROS:   Please see the history of present illness.    Denies bleeding, syncope, orthopnea, PND all other systems reviewed and are negative.  EKGs/Labs/Other Studies Reviewed:    The following studies were reviewed today: ECHO 2017: - Left ventricle: The cavity size was normal. There was moderate  concentric hypertrophy. Systolic function was severely reduced.  The estimated ejection fraction was in the range of 15% to 20%.  Akinesis of the basal-midanteroseptal myocardium. Features are  consistent with a pseudonormal left ventricular filling pattern,  with concomitant abnormal relaxation and increased filling  pressure (grade 2 diastolic dysfunction).  - Aortic valve: There was trivial regurgitation.  - Mitral valve: There was severe regurgitation.  - Left atrium: The atrium was mildly dilated.    1. Stable three vessel CAD 2.  Patent stents mid LAD with minimal restenosis.  3. Patent stent mid Ramus with minimal restenosis. The small caliber sub-branch of the ramus is jailed by the mid Ramus stent.  4. Moderate stenosis in the RCA and Circumflex.  5. No focal targets for PCI.  6. Elevated filling pressures.     Recommendations: Continue medical management of CAD. He remains volume overloaded. Would continue IV diuresis.   Diagnostic Dominance: Right   EKG:  EKG is  ordered today.  The ekg ordered today demonstrates sinus tachycardia 100 bpm left anterior fascicular block T wave inversions compatible with possible ischemia.  Recent Labs: 11/08/2019: BUN 23; Creatinine, Ser 1.52; Potassium 4.8; Sodium 142  Recent Lipid Panel    Component Value Date/Time   CHOL 109 09/23/2018 1716   TRIG 310 (H) 09/23/2018 1716   HDL 29 (L) 09/23/2018 1716   CHOLHDL 3.8 09/23/2018 1716   CHOLHDL 4.5 09/28/2015 0525   VLDL 27 09/28/2015 0525   LDLCALC 18 09/23/2018 1716    Physical Exam:    VS:  BP 90/70   Pulse 100   Ht _0  (1.854 m)   Wt 193 lb 12.8 oz (87.9 kg)   SpO2 94%   BMI 25.57 kg/m     Wt Readings from Last 3 Encounters:  12/12/19 193 lb 12.8 oz (87.9 kg)  11/08/19 197 lb (89.4 kg)  09/23/18 192 lb 8 oz (87.3 kg)     GEN:  Well nourished, well developed in no acute distress HEENT: Normal NECK: No JVD; No carotid bruits LYMPHATICS: No lymphadenopathy CARDIAC: RRR, no murmurs, rubs, gallops RESPIRATORY:  Clear to auscultation without rales, wheezing or rhonchi  ABDOMEN: Soft, non-tender, non-distended MUSCULOSKELETAL:  No edema; No deformity  SKIN: Warm and dry NEUROLOGIC:  Alert and oriented x 3 PSYCHIATRIC:  Normal affect   ASSESSMENT:    1. Chronic systolic congestive heart failure ([place])   2. Hyperlipidemia, unspecified hyperlipidemia type   3. Medication management    PLAN:    In order of problems listed above:  Ischemic cardiomyopathy/chronic systolic heart failure -Prior LAD  stent -EF 20 to 25% - will recheck  ECHO -Corlanor was previously added for tachycardia.  Spironolactone carvedilol hydralazine imdur also previously present.  Medication noncompliance has been an issue, states he is taking. . No syncope.  Blood pressure is a little bit low today but he is asymptomatic.  When I asked about his medications, he seems to remember them quite well and I do believe that he is taking them.  I did ask him to take his medications with him at the next appointment.  Neuropathy in legs  - bothers him the most.  Takes gabapentin.  Diabetes with neuropathy -On insulin.  Gabapentin.  Hemoglobin A1c 6.3.  Excellent.  Hyperlipidemia -Atorvastatin 80.  Prior LDL last year was 18.    Medication Adjustments/Labs and Tests Ordered: Current medicines are reviewed at length with the patient today.  Concerns regarding medicines are outlined above.  Orders Placed This Encounter  Procedures  . CBC  . Lipid panel  . ALT  . EKG 12-Lead   No orders of the defined types were placed in this encounter.   Patient Instructions  Medication Instructions:  The current medical regimen is effective;  continue present plan and medications.  *If you need a refill on your cardiac medications before your next appointment, please call your pharmacy*  Lab Work: Please have blood work today (CBC, Lipid, ALT)  If you have labs (blood work) drawn today and your tests are completely normal, you will receive your results only by: Marland Kitchen MyChart Message (if you have MyChart) OR . A paper copy in the mail If you have any lab test that is abnormal or we need to change your treatment, we will call you to review the results.  Follow-Up: At [facility], you and your health needs are our priority.  As part of our continuing mission to provide you with exceptional heart care, we have created designated Provider Care Teams.  These Care Teams include your primary Cardiologist (physician) and Advanced  Practice Providers (APPs -  Physician Assistants and Nurse Practitioners) who all work together to provide you with the care you need, when you need it.  We recommend signing up for the patient portal called "MyChart".  Sign up information is provided on this After Visit Summary.  MyChart is used to connect with patients for Virtual Visits (Telemedicine).  Patients are able to view lab/test results, encounter notes, upcoming appointments, etc.  Non-urgent messages can be sent to your provider as well.   To learn more about what you can do with MyChart, go to [email or web address].    Your next appointment:   6 month(s)  The format for your next appointment:   In Person  Provider:   You may see Cecilie Kicks in 6 months and  Candee Furbish, MD in 1 year.  Thank you for choosing [facility]!!           Signed, Candee Furbish, MD  12/12/2019 3:21 PM    [facility]

## 2019-12-12 NOTE — Patient Instructions (Signed)
Medication Instructions:  The current medical regimen is effective;  continue present plan and medications.  *If you need a refill on your cardiac medications before your next appointment, please call your pharmacy*  Lab Work: Please have blood work today (CBC, Lipid, ALT)  If you have labs (blood work) drawn today and your tests are completely normal, you will receive your results only by: Marland Kitchen MyChart Message (if you have MyChart) OR . A paper copy in the mail If you have any lab test that is abnormal or we need to change your treatment, we will call you to review the results.  Follow-Up: At [facility], you and your health needs are our priority.  As part of our continuing mission to provide you with exceptional heart care, we have created designated Provider Care Teams.  These Care Teams include your primary Cardiologist (physician) and Advanced Practice Providers (APPs -  Physician Assistants and Nurse Practitioners) who all work together to provide you with the care you need, when you need it.  We recommend signing up for the patient portal called "MyChart".  Sign up information is provided on this After Visit Summary.  MyChart is used to connect with patients for Virtual Visits (Telemedicine).  Patients are able to view lab/test results, encounter notes, upcoming appointments, etc.  Non-urgent messages can be sent to your provider as well.   To learn more about what you can do with MyChart, go to [email or web address].    Your next appointment:   6 month(s)  The format for your next appointment:   In Person  Provider:   You may see Cecilie Kicks in 6 months and  Candee Furbish, MD in 1 year.  Thank you for choosing [facility]!!

## 2019-12-13 LAB — LIPID PANEL
Chol/HDL Ratio: 4 ratio (ref 0.0–5.0)
Cholesterol, Total: 132 mg/dL (ref 100–199)
HDL: 33 mg/dL — ABNORMAL LOW (ref >39)
LDL Chol Calc (NIH): 78 mg/dL (ref 0–99)
Triglycerides: 115 mg/dL (ref 0–149)
VLDL Cholesterol Cal: 21 mg/dL (ref 5–40)

## 2019-12-13 LAB — CBC
Hematocrit: 40.2 % (ref 37.5–51.0)
Hemoglobin: 13.3 g/dL (ref 13.0–17.7)
MCH: 25.9 pg — ABNORMAL LOW (ref 26.6–33.0)
MCHC: 33.1 g/dL (ref 31.5–35.7)
MCV: 78 fL — ABNORMAL LOW (ref 79–97)
Platelets: 244 10*3/uL (ref 150–450)
RBC: 5.14 x10E6/uL (ref 4.14–5.80)
RDW: 15.2 % (ref 11.6–15.4)
WBC: 6.4 10*3/uL (ref 3.4–10.8)

## 2019-12-13 LAB — ALT: ALT: 24 IU/L (ref 0–44)

## 2019-12-14 ENCOUNTER — Telehealth: Payer: Self-pay

## 2019-12-14 NOTE — Telephone Encounter (Signed)
Patients daughter, [name], calls nurse line for (2) reasons. Patients daughter reports they have requested a Dexcom continuous glucose monitor, however one was never sent to the pharmacy. Secondly, daughter reports they were notified that his personal care services have been reduced by 3 hours. Daughter states he was getting 57 per month, now only 53. Patients daughter stated they did request more, however ended up with less. Patient uses [facility].

## 2019-12-15 ENCOUNTER — Other Ambulatory Visit: Payer: Self-pay | Admitting: Family Medicine

## 2019-12-15 NOTE — Telephone Encounter (Signed)
Called back. No answer. LVM. Freestyle Antonio Guerra was ordered in 10/2019. Unsure why patient did not get. Regarding HH. I would need more information from the patient, such as a contact number. It sounds more of an insurance vs. Company issue. I requested more information from the patient to help sort this out.

## 2019-12-27 ENCOUNTER — Other Ambulatory Visit: Payer: Self-pay

## 2019-12-27 ENCOUNTER — Encounter (INDEPENDENT_AMBULATORY_CARE_PROVIDER_SITE_OTHER): Payer: Medicare Other | Admitting: Ophthalmology

## 2019-12-27 DIAGNOSIS — I10 Essential (primary) hypertension: Secondary | ICD-10-CM

## 2019-12-27 DIAGNOSIS — H43813 Vitreous degeneration, bilateral: Secondary | ICD-10-CM

## 2019-12-27 DIAGNOSIS — H35033 Hypertensive retinopathy, bilateral: Secondary | ICD-10-CM | POA: Diagnosis not present

## 2019-12-27 DIAGNOSIS — E11311 Type 2 diabetes mellitus with unspecified diabetic retinopathy with macular edema: Secondary | ICD-10-CM | POA: Diagnosis not present

## 2019-12-27 DIAGNOSIS — E113513 Type 2 diabetes mellitus with proliferative diabetic retinopathy with macular edema, bilateral: Secondary | ICD-10-CM

## 2019-12-28 ENCOUNTER — Other Ambulatory Visit: Payer: Self-pay | Admitting: Family Medicine

## 2020-01-03 ENCOUNTER — Encounter: Payer: Self-pay | Admitting: Physician Assistant

## 2020-01-03 ENCOUNTER — Ambulatory Visit (INDEPENDENT_AMBULATORY_CARE_PROVIDER_SITE_OTHER): Payer: Medicare Other | Admitting: Physician Assistant

## 2020-01-03 VITALS — BP 80/52 | HR 108 | Ht 72.0 in | Wt 193.0 lb

## 2020-01-03 DIAGNOSIS — I255 Ischemic cardiomyopathy: Secondary | ICD-10-CM

## 2020-01-03 DIAGNOSIS — Z1211 Encounter for screening for malignant neoplasm of colon: Secondary | ICD-10-CM

## 2020-01-03 DIAGNOSIS — R931 Abnormal findings on diagnostic imaging of heart and coronary circulation: Secondary | ICD-10-CM

## 2020-01-03 DIAGNOSIS — Z7901 Long term (current) use of anticoagulants: Secondary | ICD-10-CM

## 2020-01-03 NOTE — Progress Notes (Signed)
Reviewed and agree with management plans. ? ?Alawna Graybeal L. Aurther Harlin, MD, MPH  ?

## 2020-01-03 NOTE — Patient Instructions (Signed)
If you are age 67 or older, your body mass index should be between 23-30. Your Body mass index is 26.18 kg/m. If this is out of the aforementioned range listed, please consider follow up with your Primary Care Provider.  If you are age 80 or younger, your body mass index should be between 19-25. Your Body mass index is 26.18 kg/m. If this is out of the aformentioned range listed, please consider follow up with your Primary Care Provider.   Your provider has ordered Cologuard testing as an option for colon cancer screening. This is performed by [employer] and may be out of network with your insurance. PRIOR to completing the test, it is YOUR responsibility to contact your insurance about covered benefits for this test. Your out of pocket expense could be anywhere from $0.00 to $649.00.   When you call to check coverage with your insurer, please provide the following information:   -The ONLY provider of Cologuard is [place] code for Cologuard is [phone].  [employer] NPI # [id number]  -[employer] Tax ID # [id number]   We have already sent your demographic and insurance information to [employer] (phone number [phone]) and they should contact you within the next week regarding your test. If you have not heard from them within the next week, please call our office at [phone].   It was a pleasure to see you today!  Ellouise Newer, [place]

## 2020-01-03 NOTE — Progress Notes (Signed)
Chief Complaint: Preprocedural visit for colonoscopy in a patient on chronic anticoagulation  HPI:    Antonio Guerra is a 69 year old male with a past medical history as listed below including chronic systolic congestive heart failure and CAD as well as stroke on Plavix, who was referred to me by Bonnita Hollow, MD for a preprocedure visit for colonoscopy.      12/12/2019 patient had an office visit with cardiology for evaluation of angina.  Echo noted in 2017 with LVEF 15-20%.    Today, the patient presents to clinic and tells me that he has abdominal pain off and on which seems to come and go with gas or bowel movement.  This does not really worry him.  His daughter accompanies him who does assist in his history.    Also tells me he is currently having some episodes of chest pain, according to cardiology evaluation they recommended repeat echo.  Daughter tells me there is not an appointment for this.    Denies change in bowel habits, blood in stool, family history of colon cancer, nausea, vomiting, weight loss, fever or chills.  Past Medical History:  Diagnosis Date  . Arthritis    "all over" (12/20/2015)  . Chronic lower back pain   . Chronic systolic congestive heart failure ([place])    a. 04/2014 Echo: EF 20-25%.  . Coronary artery disease    a. 2011 MI x 2 with PCI: stent x 2 (LAD and RI) @ [facility] in [place], [place];  b. 04/2014 Cath/PCI: LM 10-20, LAD 40p, 20m 825mSR(3.5x38 Xience DES), 50apical, LCX 20 diffuse, RI 50p, 2085mR, RCA 40-49m65md.  . Fibromyalgia   . Hyperlipidemia   . Hypertension   . Hypertensive urgency 09/27/2015  . Myocardial infarction (HCC)[place]15  . [facility]   "right hand weaker since" (12/20/2015)  . Type II diabetes mellitus (HCC)[place]  Past Surgical History:  Procedure Laterality Date  . ANKLE FRACTURE SURGERY Right   . CARDIAC CATHETERIZATION N/A 09/30/2015   Procedure: Left Heart Cath and Coronary Angiography;  Surgeon: ChriBurnell BlanksD;  [facility];  Service: Cardiovascular;  Laterality: N/A;  . CORONARY ANGIOPLASTY WITH STENT PLACEMENT     "total of 3 stents" (12/20/2015)  . FRACTIONAL FLOW RESERVE WIRE  05/11/2014   Procedure: FRACTIONAL FLOW RESERVE WIRE;  Surgeon: ChriBurnell Blanks;  [facility];  Service: Cardiovascular;;  . FRACTURE SURGERY    . LEFT HEART CATHETERIZATION WITH CORONARY/GRAFT ANGIOGRAM N/A 05/11/2014   Procedure: LEFT HEART CATHETERIZATION WITH COROBeatrix Fettersurgeon: ChriBurnell Blanks;  [facility];  Service: Cardiovascular;  Laterality: N/A;  . PERCUTANEOUS CORONARY STENT INTERVENTION (PCI-S)  05/11/2014   Procedure: PERCUTANEOUS CORONARY STENT INTERVENTION (PCI-S);  Surgeon: ChriBurnell Blanks;  [facility];  Service: Cardiovascular;;    Current Outpatient Medications  Medication Sig Dispense Refill  . atorvastatin (LIPITOR) 80 MG tablet TAKE 1 TABLET(80 MG) BY MOUTH AT BEDTIME 90 tablet 0  . ammonium lactate (AMLACTIN) 12 % lotion Apply 1 application topically 2 (two) times daily. 400 g 4  . aspirin EC 81 MG tablet Take 81 mg by mouth daily.    . BD PEN NEEDLE NANO U/F 32G X 4 MM MISC AS DIRECTED TWICE DAILY 100 each 3  . blood glucose meter kit and supplies Dispense based on patient and insurance preference. Use up to four times daily as directed. (  FOR ICD-9 250.00, 250.01). 1 each 0  . Blood Glucose Monitoring Suppl (ONETOUCH VERIO) w/Device KIT 30 Units by Does not apply route daily. 1 kit 0  . carvedilol (COREG) 25 MG tablet TAKE 1 TABLET(25 MG) BY MOUTH TWICE DAILY 60 tablet 0  . Cholecalciferol (VITAMIN D PO) Take 1 tablet by mouth daily.    . clopidogrel (PLAVIX) 75 MG tablet TAKE 1 TABLET(75 MG) BY MOUTH DAILY 90 tablet 2  . clotrimazole-betamethasone (LOTRISONE) cream Apply to both feet and between toes bid x 4 weeks. 45 g 1  . Continuous Blood Gluc Receiver (FREESTYLE LIBRE 14 DAY READER) DEVI 1 Device  by Does not apply route every morning. 1 each 3  . Dulaglutide (TRULICITY) 4.09 WJ/1.9JY SOPN INJECT 0.'75MG'$  INTO THE SKIN ONCE A WEEK 2 mL 6  . Fiber Formula CAPS Take 1 tablet by mouth every morning. 30 capsule 11  . furosemide (LASIX) 40 MG tablet TAKE 1 TABLET(40 MG) BY MOUTH DAILY 90 tablet 2  . gabapentin (NEURONTIN) 400 MG capsule Take 1 capsule (400 mg total) by mouth 2 (two) times daily. TAKE 1 CAPSULE(300 MG) BY MOUTH THREE TIMES DAILY 60 capsule 3  . glucose blood (ONETOUCH VERIO) test strip Use as instructed 100 each 12  . hydrALAZINE (APRESOLINE) 25 MG tablet Take 1 tablet (25 mg total) by mouth every 8 (eight) hours. 90 tablet 6  . Incontinence Supply Disposable (DEPEND ADJUSTABLE UNDERWEAR LG) MISC 1 application by Does not apply route as needed. 32 each 3  . insulin glargine (LANTUS SOLOSTAR) 100 UNIT/ML Solostar Pen Inject 40 Units into the skin daily. ADMINISTER 30 UNITS UNDER THE SKIN TWICE DAILY 15 mL 11  . isosorbide mononitrate (IMDUR) 30 MG 24 hr tablet TAKE 1 TABLET(30 MG) BY MOUTH DAILY 90 tablet 1  . ivabradine (CORLANOR) 5 MG TABS tablet Take 1 tablet (5 mg total) by mouth 2 (two) times daily with a meal. 60 tablet 6  . Lancet Device MISC To be use with glucometer 1 each 0  . Lancets (ONETOUCH ULTRASOFT) lancets USE AS DIRECTED 100 each 0  . nitroGLYCERIN (NITROSTAT) 0.4 MG SL tablet Place 1 tablet (0.4 mg total) under the tongue every 5 (five) minutes as needed for chest pain. 25 tablet 3  . pantoprazole (PROTONIX) 40 MG tablet Take 1 tablet (40 mg total) by mouth daily. 30 tablet 0  . prednisoLONE acetate (PRED FORTE) 1 % ophthalmic suspension SHAKE LQ AND INT 1 GTT IN OD QID FOR 2 WKS    . RESTASIS 0.05 % ophthalmic emulsion INT 1 GTT IN OU BID    . spironolactone (ALDACTONE) 25 MG tablet Take 1 tablet (25 mg total) by mouth daily. 30 tablet 6  . sucralfate (CARAFATE) 1 g tablet Take 1 tablet (1 g total) by mouth 4 (four) times daily -  with meals and at bedtime. 28  tablet 0  . trimethoprim-polymyxin b (POLYTRIM) ophthalmic solution INT 1 GTT OU QID FOR 2 DAYS AFTER EACH MONTHLY EYE INJECTION     No current facility-administered medications for this visit.    Allergies as of 01/03/2020 - Review Complete 12/12/2019  Allergen Reaction Noted  . Tape Itching and Rash 12/18/2015    Family History  Problem Relation Age of Onset  . Diabetes Mother   . CAD Neg Hx     Social History   Socioeconomic History  . Marital status: Married    Spouse name: Not on file  . Number of children: Not on file  .  Years of education: Not on file  . Highest education level: Not on file  Occupational History  . Not on file  Tobacco Use  . Smoking status: Former Research scientist (life sciences)  . Smokeless tobacco: Never Used  . Tobacco comment: 12/20/2015 "smoked cigarettes 30-49yrago"  Substance and Sexual Activity  . Alcohol use: Yes    Alcohol/week: 5.0 standard drinks    Types: 5 Cans of beer per week  . Drug use: No  . Sexual activity: Not Currently  Other Topics Concern  . Not on file  Social History Narrative  . Not on file   Social Determinants of Health   Financial Resource Strain:   . Difficulty of Paying Living Expenses:   Food Insecurity:   . Worried About RCharity fundraiserin the Last Year:   . RArboriculturistin the Last Year:   Transportation Needs:   . LFilm/video editor(Medical):   .Marland KitchenLack of Transportation (Non-Medical):   Physical Activity:   . Days of Exercise per Week:   . Minutes of Exercise per Session:   Stress:   . Feeling of Stress :   Social Connections:   . Frequency of Communication with Friends and Family:   . Frequency of Social Gatherings with Friends and Family:   . Attends Religious Services:   . Active Member of Clubs or Organizations:   . Attends CArchivistMeetings:   .Marland KitchenMarital Status:   Intimate Partner Violence:   . Fear of Current or Ex-Partner:   . Emotionally Abused:   .Marland KitchenPhysically Abused:   . Sexually  Abused:     Review of Systems:    Constitutional: No weight loss, fever or chills Skin: No rash  Cardiovascular: +chest pain Respiratory: No SOB Gastrointestinal: See HPI and otherwise negative Genitourinary: No dysuria  Neurological: No headache, dizziness or syncope Musculoskeletal: No new muscle or joint pain Hematologic: No bleeding  Psychiatric: No history of depression or anxiety   Physical Exam:  Vital signs: BP (!) 80/52   Pulse (!) 108   Ht 6' (1.829 m)   Wt 193 lb (87.5 kg)   BMI 26.18 kg/m   Constitutional:   Pleasant AA male appears to be in NAD, Well developed, Well nourished, alert and cooperative Head:  Normocephalic and atraumatic. Eyes:   PEERL, EOMI. No icterus. Conjunctiva pink. Ears:  Normal auditory acuity. Neck:  Supple Throat: Oral cavity and pharynx without inflammation, swelling or lesion.  Respiratory: Respirations even and unlabored. Lungs clear to auscultation bilaterally.   No wheezes, crackles, or rhonchi.  Cardiovascular: Normal S1, S2. No MRG. Regular rate and rhythm. No peripheral edema, cyanosis or pallor.  Gastrointestinal:  Soft, nondistended, mild generalized ttp, No rebound or guarding. Normal bowel sounds. No appreciable masses or hepatomegaly. Rectal:  Not performed.  Msk:  Symmetrical without gross deformities. Without edema, no deformity or joint abnormality.  Neurologic:  Alert and  oriented x4;  grossly normal neurologically.  Skin:   Dry and intact without significant lesions or rashes. Psychiatric:  Demonstrates good judgement and reason without abnormal affect or behaviors.  RELEVANT LABS AND IMAGING: CBC    Component Value Date/Time   WBC 6.4 12/12/2019 1447   WBC 6.1 08/17/2016 1852   RBC 5.14 12/12/2019 1447   RBC 4.83 08/17/2016 1852   HGB 13.3 12/12/2019 1447   HCT 40.2 12/12/2019 1447   PLT 244 12/12/2019 1447   MCV 78 (L) 12/12/2019 1447   MCH 25.9 (L)  12/12/2019 1447   MCH 27.3 08/17/2016 1852   MCHC 33.1  12/12/2019 1447   MCHC 35.0 08/17/2016 1852   RDW 15.2 12/12/2019 1447   LYMPHSABS 1.9 09/23/2018 1716   MONOABS 0.4 08/17/2016 1852   EOSABS 0.1 09/23/2018 1716   BASOSABS 0.0 09/23/2018 1716    CMP     Component Value Date/Time   NA 142 11/08/2019 1359   K 4.8 11/08/2019 1359   CL 107 (H) 11/08/2019 1359   CO2 23 11/08/2019 1359   GLUCOSE 80 11/08/2019 1359   GLUCOSE 151 (H) 08/17/2016 1852   BUN 23 11/08/2019 1359   CREATININE 1.52 (H) 11/08/2019 1359   CREATININE 1.35 (H) 11/25/2015 1701   CALCIUM 9.3 11/08/2019 1359   PROT 7.2 03/19/2017 1710   ALBUMIN 4.3 03/19/2017 1710   AST 16 03/19/2017 1710   ALT 24 12/12/2019 1447   ALKPHOS 95 03/19/2017 1710   BILITOT 0.4 03/19/2017 1710   GFRNONAA 47 (L) 11/08/2019 1359   GFRAA 54 (L) 11/08/2019 1359    Assessment: 1.  Screening for colorectal cancer: Patient is 5 and never had screening for colon cancer 2.  Decreased EF: Last check in 2017, EF was 15-20%, cardiologist has ordered repeat echo 3.  Chronic anticoagulation: With Plavix for CAD  Plan: 1.  Discussed options with the patient and his daughter.  At this point, given his heart failure and chronic anticoagulation would recommend that we proceed with Cologuard as a first step in colon cancer screening.  Discussed that this test shows cells from polyps which may be in the patient's colon.  If this is positive then we would recommend a colonoscopy which would need to be scheduled in the hospital for further evaluation.  They are aware. 2.  Patient will follow in clinic per recommendations after testing above.  Patient was assigned to Dr. Tarri Glenn this afternoon.  Ellouise Newer, PA-C [facility] 01/03/2020, 2:12 PM  Cc: Bonnita Hollow, MD

## 2020-01-23 ENCOUNTER — Other Ambulatory Visit: Payer: Self-pay | Admitting: Student in an Organized Health Care Education/Training Program

## 2020-01-23 DIAGNOSIS — I10 Essential (primary) hypertension: Secondary | ICD-10-CM

## 2020-01-24 ENCOUNTER — Ambulatory Visit (INDEPENDENT_AMBULATORY_CARE_PROVIDER_SITE_OTHER): Payer: Medicare Other | Admitting: Podiatry

## 2020-01-24 ENCOUNTER — Encounter: Payer: Self-pay | Admitting: Podiatry

## 2020-01-24 ENCOUNTER — Encounter (INDEPENDENT_AMBULATORY_CARE_PROVIDER_SITE_OTHER): Payer: Medicare Other | Admitting: Ophthalmology

## 2020-01-24 ENCOUNTER — Other Ambulatory Visit: Payer: Self-pay

## 2020-01-24 DIAGNOSIS — B351 Tinea unguium: Secondary | ICD-10-CM

## 2020-01-24 DIAGNOSIS — E11311 Type 2 diabetes mellitus with unspecified diabetic retinopathy with macular edema: Secondary | ICD-10-CM | POA: Diagnosis not present

## 2020-01-24 DIAGNOSIS — H43813 Vitreous degeneration, bilateral: Secondary | ICD-10-CM

## 2020-01-24 DIAGNOSIS — E113513 Type 2 diabetes mellitus with proliferative diabetic retinopathy with macular edema, bilateral: Secondary | ICD-10-CM

## 2020-01-24 DIAGNOSIS — H2513 Age-related nuclear cataract, bilateral: Secondary | ICD-10-CM

## 2020-01-24 DIAGNOSIS — H35033 Hypertensive retinopathy, bilateral: Secondary | ICD-10-CM | POA: Diagnosis not present

## 2020-01-24 DIAGNOSIS — I10 Essential (primary) hypertension: Secondary | ICD-10-CM | POA: Diagnosis not present

## 2020-01-24 DIAGNOSIS — E1142 Type 2 diabetes mellitus with diabetic polyneuropathy: Secondary | ICD-10-CM

## 2020-01-24 NOTE — Patient Instructions (Signed)
Diabetes Mellitus and Foot Care Foot care is an important part of your health, especially when you have diabetes. Diabetes may cause you to have problems because of poor blood flow (circulation) to your feet and legs, which can cause your skin to:  Become thinner and drier.  Break more easily.  Heal more slowly.  Peel and crack. You may also have nerve damage (neuropathy) in your legs and feet, causing decreased feeling in them. This means that you may not notice minor injuries to your feet that could lead to more serious problems. Noticing and addressing any potential problems early is the best way to prevent future foot problems. How to care for your feet Foot hygiene  Wash your feet daily with warm water and mild soap. Do not use hot water. Then, pat your feet and the areas between your toes until they are completely dry. Do not soak your feet as this can dry your skin.  Trim your toenails straight across. Do not dig under them or around the cuticle. File the edges of your nails with an emery board or nail file.  Apply a moisturizing lotion or petroleum jelly to the skin on your feet and to dry, brittle toenails. Use lotion that does not contain alcohol and is unscented. Do not apply lotion between your toes. Shoes and socks  Wear clean socks or stockings every day. Make sure they are not too tight. Do not wear knee-high stockings since they may decrease blood flow to your legs.  Wear shoes that fit properly and have enough cushioning. Always look in your shoes before you put them on to be sure there are no objects inside.  To break in new shoes, wear them for just a few hours a day. This prevents injuries on your feet. Wounds, scrapes, corns, and calluses  Check your feet daily for blisters, cuts, bruises, sores, and redness. If you cannot see the bottom of your feet, use a mirror or ask someone for help.  Do not cut corns or calluses or try to remove them with medicine.  If you  find a minor scrape, cut, or break in the skin on your feet, keep it and the skin around it clean and dry. You may clean these areas with mild soap and water. Do not clean the area with peroxide, alcohol, or iodine.  If you have a wound, scrape, corn, or callus on your foot, look at it several times a day to make sure it is healing and not infected. Check for: ? Redness, swelling, or pain. ? Fluid or blood. ? Warmth. ? Pus or a bad smell. General instructions  Do not cross your legs. This may decrease blood flow to your feet.  Do not use heating pads or hot water bottles on your feet. They may burn your skin. If you have lost feeling in your feet or legs, you may not know this is happening until it is too late.  Protect your feet from hot and cold by wearing shoes, such as at the beach or on hot pavement.  Schedule a complete foot exam at least once a year (annually) or more often if you have foot problems. If you have foot problems, report any cuts, sores, or bruises to your health care provider immediately. Contact a health care provider if:  You have a medical condition that increases your risk of infection and you have any cuts, sores, or bruises on your feet.  You have an injury that is not   healing.  You have redness on your legs or feet.  You feel burning or tingling in your legs or feet.  You have pain or cramps in your legs and feet.  Your legs or feet are numb.  Your feet always feel cold.  You have pain around a toenail. Get help right away if:  You have a wound, scrape, corn, or callus on your foot and: ? You have pain, swelling, or redness that gets worse. ? You have fluid or blood coming from the wound, scrape, corn, or callus. ? Your wound, scrape, corn, or callus feels warm to the touch. ? You have pus or a bad smell coming from the wound, scrape, corn, or callus. ? You have a fever. ? You have a red line going up your leg. Summary  Check your feet every day  for cuts, sores, red spots, swelling, and blisters.  Moisturize feet and legs daily.  Wear shoes that fit properly and have enough cushioning.  If you have foot problems, report any cuts, sores, or bruises to your health care provider immediately.  Schedule a complete foot exam at least once a year (annually) or more often if you have foot problems. This information is not intended to replace advice given to you by your health care provider. Make sure you discuss any questions you have with your health care provider. Document Revised: 03/29/2019 Document Reviewed: 08/07/2016 Elsevier Patient Education  2020 Elsevier Inc.  

## 2020-01-28 NOTE — Progress Notes (Signed)
Subjective: Antonio Guerra presents today at risk foot care. Pt has h/o NIDDM with chronic kidney disease and painful thick toenails that are difficult to trim. Pain interferes with ambulation. Aggravating factors include wearing enclosed shoe gear. Pain is relieved with periodic professional debridement.  [name], MD is patient's PCP.  Past Medical History:  Diagnosis Date  . Arthritis    "all over" (12/20/2015)  . Chronic lower back pain   . Chronic systolic congestive heart failure ([place])    a. 04/2014 Echo: EF 20-25%.  . Coronary artery disease    a. 2011 MI x 2 with PCI: stent x 2 (LAD and RI) @ [facility] in [place], [place];  b. 04/2014 Cath/PCI: LM 10-20, LAD 40p, 12m 836mSR(3.5x38 Xience DES), 50apical, LCX 20 diffuse, RI 50p, 20106mR, RCA 40-65m68md.  . Fibromyalgia   . Hyperlipidemia   . Hypertension   . Hypertensive urgency 09/27/2015  . Myocardial infarction (HCC)[place]15  . [facility]   "right hand weaker since" (12/20/2015)  . [facility]   Patient Active Problem List   Diagnosis Date Noted  . Stage 3a chronic kidney disease 11/08/2019  . History of angina 11/08/2019  . Intermittent diarrhea 11/08/2019  . Screening for colon cancer 11/08/2019  . Encounter for immunization 09/23/2018  . Altered mental status   . Type 2 diabetes mellitus with vascular disease (HCC)[place]/07/2015  . Alcohol intoxication (HCC)[place]/07/2015  . 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 Epigastric pain   . Elevated troponin   . Type 2 diabetes mellitus (HCC)[place]/06/2016  . Acute on chronic combined systolic (congestive) and diastolic (congestive) heart failure (HCC)[place]/04/2016  . Ischemic cardiomyopathy   . Hyperlipidemia   . Coronary artery disease   . Chronic systolic congestive heart failure (HCC)[place]. [facility]  Current Outpatient Medications on File Prior to Visit  Medication Sig Dispense Refill  . ammonium lactate (AMLACTIN) 12 %  lotion Apply 1 application topically 2 (two) times daily. 400 g 4  . aspirin EC 81 MG tablet Take 81 mg by mouth daily.    . atMarland Kitchenrvastatin (LIPITOR) 80 MG tablet TAKE 1 TABLET(80 MG) BY MOUTH AT BEDTIME 90 tablet 0  . BD PEN NEEDLE NANO U/F 32G X 4 MM MISC AS DIRECTED TWICE DAILY 100 each 3  . blood glucose meter kit and supplies Dispense based on patient and insurance preference. Use up to four times daily as directed. (FOR ICD-9 250.00, 250.01). 1 each 0  . Blood Glucose Monitoring Suppl (ONETOUCH VERIO) w/Device KIT 30 Units by Does not apply route daily. 1 kit 0  . carvedilol (COREG) 25 MG tablet TAKE 1 TABLET(25 MG) BY MOUTH TWICE DAILY 60 tablet 0  . Cholecalciferol (VITAMIN D PO) Take 1 tablet by mouth daily.    . clopidogrel (PLAVIX) 75 MG tablet TAKE 1 TABLET(75 MG) BY MOUTH DAILY 90 tablet 2  . clotrimazole-betamethasone (LOTRISONE) cream Apply to both feet and between toes bid x 4 weeks. 45 g 1  . Continuous Blood Gluc Receiver (FREESTYLE LIBRE 14 DAY READER) DEVI 1 Device by Does not apply route every morning. 1 each 3  . Dulaglutide (TRULICITY) 0.754.080XK/4.8JEN INJECT 0.75MG INTO THE SKIN ONCE A WEEK 2 mL 6  . Fiber Formula CAPS Take 1 tablet by mouth every morning. 30 capsule 11  . furosemide (LASIX) 40 MG tablet TAKE 1 TABLET(40 MG) BY MOUTH DAILY 90  tablet 2  . gabapentin (NEURONTIN) 400 MG capsule Take 1 capsule (400 mg total) by mouth 2 (two) times daily. TAKE 1 CAPSULE(300 MG) BY MOUTH THREE TIMES DAILY 60 capsule 3  . glucose blood (ONETOUCH VERIO) test strip Use as instructed 100 each 12  . hydrALAZINE (APRESOLINE) 25 MG tablet Take 1 tablet (25 mg total) by mouth every 8 (eight) hours. 90 tablet 6  . Incontinence Supply Disposable (DEPEND ADJUSTABLE UNDERWEAR LG) MISC 1 application by Does not apply route as needed. 32 each 3  . insulin glargine (LANTUS SOLOSTAR) 100 UNIT/ML Solostar Pen Inject 40 Units into the skin daily. ADMINISTER 30 UNITS UNDER THE SKIN TWICE DAILY 15 mL  11  . isosorbide mononitrate (IMDUR) 30 MG 24 hr tablet TAKE 1 TABLET(30 MG) BY MOUTH DAILY 90 tablet 1  . ivabradine (CORLANOR) 5 MG TABS tablet Take 1 tablet (5 mg total) by mouth 2 (two) times daily with a meal. 60 tablet 6  . Lancet Device MISC To be use with glucometer 1 each 0  . Lancets (ONETOUCH ULTRASOFT) lancets USE AS DIRECTED 100 each 0  . nitroGLYCERIN (NITROSTAT) 0.4 MG SL tablet Place 1 tablet (0.4 mg total) under the tongue every 5 (five) minutes as needed for chest pain. 25 tablet 3  . pantoprazole (PROTONIX) 40 MG tablet Take 1 tablet (40 mg total) by mouth daily. 30 tablet 0  . prednisoLONE acetate (PRED FORTE) 1 % ophthalmic suspension SHAKE LQ AND INT 1 GTT IN OD QID FOR 2 WKS    . RESTASIS 0.05 % ophthalmic emulsion INT 1 GTT IN OU BID    . spironolactone (ALDACTONE) 25 MG tablet Take 1 tablet (25 mg total) by mouth daily. 30 tablet 6  . sucralfate (CARAFATE) 1 g tablet Take 1 tablet (1 g total) by mouth 4 (four) times daily -  with meals and at bedtime. 28 tablet 0  . trimethoprim-polymyxin b (POLYTRIM) ophthalmic solution INT 1 GTT OU QID FOR 2 DAYS AFTER EACH MONTHLY EYE INJECTION    . [DISCONTINUED] furosemide (LASIX) 40 MG tablet TAKE 1 TABLET BY MOUTH EVERY DAY 90 tablet 0   No current facility-administered medications on file prior to visit.     Allergies  Allergen Reactions  . Tape Itching and Rash    Objective: Antonio Guerra is a pleasant 69 y.o. African American male in NAD. AAO x 3.  There were no vitals filed for this visit.  Vascular Examination: Neurovascular status unchanged b/l lower extremities. Capillary fill time to digits <3 seconds b/l lower extremities. Faintly palpable pedal pulses b/l. Pedal hair absent. Lower extremity skin temperature gradient within normal limits. No pain with calf compression b/l.  Dermatological Examination: Pedal skin with normal turgor, texture and tone bilaterally. No open wounds bilaterally. No interdigital  macerations bilaterally. Toenails 1-5 b/l elongated, discolored, dystrophic, thickened, crumbly with subungual debris and tenderness to dorsal palpation.  Musculoskeletal: Normal muscle strength 5/5 to all lower extremity muscle groups bilaterally. No pain crepitus or joint limitation noted with ROM b/l. Hallux valgus with bunion deformity noted b/l lower extremities.  Neurological Examination: Protective sensation decreased with 10 gram monofilament b/l. Vibratory sensation decreased b/l. Clonus negative b/l.  Last A1c: Hemoglobin A1C Latest Ref Rng & Units 11/08/2019  HGBA1C 0.0 - 7.0 % 6.3  Some recent data might be hidden    Assessment: 1. Onychomycosis   2. Diabetic peripheral neuropathy associated with type 2 diabetes mellitus ([place])    Plan: -Examined patient. -No new findings.  No new orders. -Continue diabetic foot care principles. -Toenails 1-5 b/l were debrided in length and girth with sterile nail nippers and dremel without iatrogenic bleeding.  -Patient to report any pedal injuries to medical professional immediately. -Patient to continue soft, supportive shoe gear daily. -Patient/POA to call should there be question/concern in the interim.  Return in about 3 months (around 04/25/2020) for diabetic nail trim/ Plavix.  Marzetta Board, DPM

## 2020-02-05 ENCOUNTER — Telehealth: Payer: Self-pay

## 2020-02-05 NOTE — Telephone Encounter (Signed)
[name], patients daughter, calls nurse line reporting Larin is unable to void. [name] reports he last urinated ~14-16 hours ago. [name] reports he feels the pressure to go, however nothing happens. [name] reports good hydration, "nothing out of the ordinary." Advised them to go to urgent care, as we have no apts this afternoon, and the patient is very uncomfortable. [name] agreed with plan.

## 2020-02-16 ENCOUNTER — Other Ambulatory Visit: Payer: Self-pay | Admitting: Family Medicine

## 2020-02-16 DIAGNOSIS — I10 Essential (primary) hypertension: Secondary | ICD-10-CM

## 2020-02-27 ENCOUNTER — Emergency Department (HOSPITAL_COMMUNITY): Payer: Medicare Other

## 2020-02-27 ENCOUNTER — Other Ambulatory Visit: Payer: Self-pay

## 2020-02-27 ENCOUNTER — Emergency Department (HOSPITAL_COMMUNITY)
Admission: EM | Admit: 2020-02-27 | Discharge: 2020-02-28 | Disposition: A | Discharge status: Home / Self Care | Payer: Medicare Other | Attending: Emergency Medicine | Admitting: Emergency Medicine

## 2020-02-27 DIAGNOSIS — I6789 Other cerebrovascular disease: Secondary | ICD-10-CM | POA: Diagnosis not present

## 2020-02-27 DIAGNOSIS — Z955 Presence of coronary angioplasty implant and graft: Secondary | ICD-10-CM | POA: Diagnosis not present

## 2020-02-27 DIAGNOSIS — U071 COVID-19: Secondary | ICD-10-CM | POA: Diagnosis not present

## 2020-02-27 DIAGNOSIS — N1831 Chronic kidney disease, stage 3a: Secondary | ICD-10-CM | POA: Insufficient documentation

## 2020-02-27 DIAGNOSIS — J3489 Other specified disorders of nose and nasal sinuses: Secondary | ICD-10-CM | POA: Diagnosis not present

## 2020-02-27 DIAGNOSIS — R29810 Facial weakness: Secondary | ICD-10-CM | POA: Diagnosis not present

## 2020-02-27 DIAGNOSIS — Z794 Long term (current) use of insulin: Secondary | ICD-10-CM | POA: Diagnosis not present

## 2020-02-27 DIAGNOSIS — R404 Transient alteration of awareness: Secondary | ICD-10-CM | POA: Diagnosis not present

## 2020-02-27 DIAGNOSIS — R531 Weakness: Secondary | ICD-10-CM

## 2020-02-27 DIAGNOSIS — Z79899 Other long term (current) drug therapy: Secondary | ICD-10-CM | POA: Diagnosis not present

## 2020-02-27 DIAGNOSIS — R000 Tachycardia, unspecified: Secondary | ICD-10-CM | POA: Diagnosis not present

## 2020-02-27 DIAGNOSIS — I2510 Atherosclerotic heart disease of native coronary artery without angina pectoris: Secondary | ICD-10-CM | POA: Insufficient documentation

## 2020-02-27 DIAGNOSIS — Z743 Need for continuous supervision: Secondary | ICD-10-CM | POA: Diagnosis not present

## 2020-02-27 DIAGNOSIS — I499 Cardiac arrhythmia, unspecified: Secondary | ICD-10-CM | POA: Diagnosis not present

## 2020-02-27 DIAGNOSIS — J320 Chronic maxillary sinusitis: Secondary | ICD-10-CM | POA: Diagnosis not present

## 2020-02-27 DIAGNOSIS — I5043 Acute on chronic combined systolic (congestive) and diastolic (congestive) heart failure: Secondary | ICD-10-CM | POA: Diagnosis not present

## 2020-02-27 DIAGNOSIS — R55 Syncope and collapse: Secondary | ICD-10-CM | POA: Diagnosis not present

## 2020-02-27 DIAGNOSIS — Z7901 Long term (current) use of anticoagulants: Secondary | ICD-10-CM | POA: Diagnosis not present

## 2020-02-27 DIAGNOSIS — I130 Hypertensive heart and chronic kidney disease with heart failure and stage 1 through stage 4 chronic kidney disease, or unspecified chronic kidney disease: Secondary | ICD-10-CM | POA: Diagnosis not present

## 2020-02-27 DIAGNOSIS — E119 Type 2 diabetes mellitus without complications: Secondary | ICD-10-CM | POA: Diagnosis not present

## 2020-02-27 DIAGNOSIS — Z87891 Personal history of nicotine dependence: Secondary | ICD-10-CM | POA: Diagnosis not present

## 2020-02-27 LAB — COMPREHENSIVE METABOLIC PANEL
ALT: 31 U/L (ref 0–44)
AST: 35 U/L (ref 15–41)
Albumin: 3.6 g/dL (ref 3.5–5.0)
Alkaline Phosphatase: 85 U/L (ref 38–126)
Anion gap: 12 (ref 5–15)
BUN: 30 mg/dL — ABNORMAL HIGH (ref 8–23)
CO2: 20 mmol/L — ABNORMAL LOW (ref 22–32)
Calcium: 8.6 mg/dL — ABNORMAL LOW (ref 8.9–10.3)
Chloride: 104 mmol/L (ref 98–111)
Creatinine, Ser: 1.74 mg/dL — ABNORMAL HIGH (ref 0.61–1.24)
GFR calc Af Amer: 46 mL/min — ABNORMAL LOW (ref >60)
GFR calc non Af Amer: 40 mL/min — ABNORMAL LOW (ref >60)
Glucose, Bld: 177 mg/dL — ABNORMAL HIGH (ref 70–99)
Potassium: 4 mmol/L (ref 3.5–5.1)
Sodium: 136 mmol/L (ref 135–145)
Total Bilirubin: 0.9 mg/dL (ref 0.3–1.2)
Total Protein: 6.9 g/dL (ref 6.5–8.1)

## 2020-02-27 LAB — CBC WITH DIFFERENTIAL/PLATELET
Abs Immature Granulocytes: 0.02 10*3/uL (ref 0.00–0.07)
Basophils Absolute: 0 10*3/uL (ref 0.0–0.1)
Basophils Relative: 0 %
Eosinophils Absolute: 0 10*3/uL (ref 0.0–0.5)
Eosinophils Relative: 0 %
HCT: 39.8 % (ref 39.0–52.0)
Hemoglobin: 12.3 g/dL — ABNORMAL LOW (ref 13.0–17.0)
Immature Granulocytes: 0 %
Lymphocytes Relative: 14 %
Lymphs Abs: 0.9 10*3/uL (ref 0.7–4.0)
MCH: 24.5 pg — ABNORMAL LOW (ref 26.0–34.0)
MCHC: 30.9 g/dL (ref 30.0–36.0)
MCV: 79.1 fL — ABNORMAL LOW (ref 80.0–100.0)
Monocytes Absolute: 0.7 10*3/uL (ref 0.1–1.0)
Monocytes Relative: 11 %
Neutro Abs: 4.8 10*3/uL (ref 1.7–7.7)
Neutrophils Relative %: 75 %
Platelets: 201 10*3/uL (ref 150–400)
RBC: 5.03 MIL/uL (ref 4.22–5.81)
RDW: 14.5 % (ref 11.5–15.5)
WBC: 6.4 10*3/uL (ref 4.0–10.5)
nRBC: 0 % (ref 0.0–0.2)

## 2020-02-27 LAB — SARS CORONAVIRUS 2 BY RT PCR (HOSPITAL ORDER, PERFORMED IN ~~LOC~~ HOSPITAL LAB): SARS Coronavirus 2: POSITIVE — AB

## 2020-02-27 NOTE — ED Provider Notes (Signed)
Patient care signed out at end of shift by Alyse Low, PA-C.   Lives with daughter, c/w increasing weakness and cough x several days. C/o difficulty urinating, mild diarrhea.   ?UTI CT head negative, CXR neg COVID, UA, CMET pending  Plan: review labs, rectal temp, re-evaluate and discuss disposition with daughter who is at beside.   Patient's COVID test is positive. UA negative. Discussed monoclonal infusion and patient would like to receive this.   No observed reaction to the infusion. VSS. No hypoxia. He is felt appropriate for discharge home.    Charlann Lange, PA-C 02/28/20 0346    Truddie Hidden, MD 02/28/20 1245

## 2020-02-27 NOTE — ED Triage Notes (Signed)
Pt BIB [facility] from home c/o generalized body aches, weakness, and increased SOB.   EMS reports on arrival noted pale, weakness, unable to ambulate, with garbled speech, which resolved prior to transport. Per EMS LKN 1 pm. VS WDL.   Daughter Antonio Guerra reports noting increased fatigue with patient x1 day. Sleeping most of the day today. Confused, not baseline A&Ox3 disoriented to time. Difficulty preforming ADLs today. Diarrhea x1 day. No known COVID exposure. No COVID vaccines. HX TIA.

## 2020-02-27 NOTE — ED Provider Notes (Signed)
[facility] EMERGENCY DEPARTMENT Provider Note   CSN: [id number] Arrival date & time: 02/27/20  2017     History Chief Complaint  Patient presents with  . Fatigue    Antonio Guerra is a 69 y.o. male.  The history is provided by the patient. No language interpreter was used.  Weakness Severity:  Moderate Onset quality:  Sudden Timing:  Constant Progression:  Worsening Chronicity:  New Relieved by:  Nothing Worsened by:  Nothing Ineffective treatments:  None tried Risk factors: no new medications   Pt daughter reports pt is having difficulty urinating.  He has also had a cough.  Daughter reports pt was to weak to stand up today      Past Medical History:  Diagnosis Date  . Arthritis    "all over" (12/20/2015)  . Chronic lower back pain   . Chronic systolic congestive heart failure ([place])    a. 04/2014 Echo: EF 20-25%.  . Coronary artery disease    a. 2011 MI x 2 with PCI: stent x 2 (LAD and RI) @ [facility] in [place], [place];  b. 04/2014 Cath/PCI: LM 10-20, LAD 40p, 3m 862mSR(3.5x38 Xience DES), 50apical, LCX 20 diffuse, RI 50p, 2019mR, RCA 40-59m14md.  . Fibromyalgia   . Hyperlipidemia   . Hypertension   . Hypertensive urgency 09/27/2015  . Myocardial infarction (HCC)[place]15  . [facility]   "right hand weaker since" (12/20/2015)  . [facility]  Patient Active Problem List   Diagnosis Date Noted  . Stage 3a chronic kidney disease 11/08/2019  . History of angina 11/08/2019  . Intermittent diarrhea 11/08/2019  . Screening for colon cancer 11/08/2019  . Encounter for immunization 09/23/2018  . Altered mental status   . 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 Alcohol intoxication (HCC)[place]/07/2015  . Substance-induced anxiety disorder (HCC)[place]/07/2015  . Epigastric pain   . Elevated troponin   . Type 2 diabetes mellitus (HCC)[place]/06/2016  . Acute on chronic combined systolic (congestive)  and diastolic (congestive) heart failure (HCC)[place]/04/2016  . Ischemic cardiomyopathy   . Hyperlipidemia   . Coronary artery disease   . Chronic systolic congestive heart failure (HCC)[place]. [facility]  Past Surgical History:  Procedure Laterality Date  . ANKLE FRACTURE SURGERY Right   . CARDIAC CATHETERIZATION N/A 09/30/2015   Procedure: Left Heart Cath and Coronary Angiography;  Surgeon: ChriBurnell Blanks;  [facility];  Service: Cardiovascular;  Laterality: N/A;  . CORONARY ANGIOPLASTY WITH STENT PLACEMENT     "total of 3 stents" (12/20/2015)  . FRACTIONAL FLOW RESERVE WIRE  05/11/2014   Procedure: FRACTIONAL FLOW RESERVE WIRE;  Surgeon: ChriBurnell Blanks;  [facility];  Service: Cardiovascular;;  . FRACTURE SURGERY    . LEFT HEART CATHETERIZATION WITH CORONARY/GRAFT ANGIOGRAM N/A 05/11/2014   Procedure: LEFT HEART CATHETERIZATION WITH COROBeatrix Fettersurgeon: ChriBurnell Blanks;  [facility];  Service: Cardiovascular;  Laterality: N/A;  . PERCUTANEOUS CORONARY STENT INTERVENTION (PCI-S)  05/11/2014   Procedure: PERCUTANEOUS CORONARY STENT INTERVENTION (PCI-S);  Surgeon: ChriBurnell Blanks;  [facility];  Service: Cardiovascular;;       Family History  Problem Relation Age of Onset  . Diabetes Mother   . CAD Neg Hx   . Stomach cancer Neg Hx   . Colon cancer Neg Hx   .  Esophageal cancer Neg Hx   . Pancreatic cancer Neg Hx     Social History   Tobacco Use  . Smoking status: Former Research scientist (life sciences)  . Smokeless tobacco: Never Used  . Tobacco comment: 12/20/2015 "smoked cigarettes 30-55yrago"  Vaping Use  . Vaping Use: Never used  Substance Use Topics  . Alcohol use: Not Currently    Alcohol/week: 5.0 standard drinks    Types: 5 Cans of beer per week    Comment: stopped 4 yrs ago   . Drug use: No    Home Medications Prior to Admission medications   Medication Sig Start Date End Date  Taking? Authorizing Provider  ammonium lactate (AMLACTIN) 12 % lotion Apply 1 application topically 2 (two) times daily. 06/02/19   GMarzetta Board DPM  aspirin EC 81 MG tablet Take 81 mg by mouth daily.    [provider]  atorvastatin (LIPITOR) 80 MG tablet TAKE 1 TABLET(80 MG) BY MOUTH AT BEDTIME 12/28/19   TBonnita Hollow MD  BD PEN NEEDLE NANO U/F 32G X 4 MM MISC AS DIRECTED TWICE DAILY 07/29/19   TBonnita Hollow MD  blood glucose meter kit and supplies Dispense based on patient and insurance preference. Use up to four times daily as directed. (FOR ICD-9 250.00, 250.01). 10/02/15   Rai, Ripudeep K, MD  Blood Glucose Monitoring Suppl (ONETOUCH VERIO) w/Device KIT 30 Units by Does not apply route daily. 11/02/17   Diallo, AEarna Coder MD  carvedilol (COREG) 25 MG tablet TAKE 1 TABLET(25 MG) BY MOUTH TWICE DAILY 02/16/20   SZola Button MD  Cholecalciferol (VITAMIN D PO) Take 1 tablet by mouth daily.    [provider]  clopidogrel (PLAVIX) 75 MG tablet TAKE 1 TABLET(75 MG) BY MOUTH DAILY 10/16/19   ADoristine MangoL, DO  clotrimazole-betamethasone (LOTRISONE) cream Apply to both feet and between toes bid x 4 weeks. 06/02/19   GMarzetta Board DPM  Continuous Blood Gluc Receiver (FREESTYLE LIBRE 14 DAY READER) DEVI 1 Device by Does not apply route every morning. 11/08/19   TBonnita Hollow MD  Dulaglutide (TRULICITY) 06.76MPP/5.0DTSOPN INJECT 0.'75MG'$  INTO THE SKIN ONCE A WEEK 07/29/19   TBonnita Hollow MD  Fiber Formula CAPS Take 1 tablet by mouth every morning. 11/08/19   TBonnita Hollow MD  furosemide (LASIX) 40 MG tablet TAKE 1 TABLET(40 MG) BY MOUTH DAILY 10/18/19   Anderson, Chelsey L, DO  gabapentin (NEURONTIN) 400 MG capsule Take 1 capsule (400 mg total) by mouth 2 (two) times daily. TAKE 1 CAPSULE(300 MG) BY MOUTH THREE TIMES DAILY 11/07/19   TJosephine IgoB, MD  glucose blood ([facility]) test strip Use as instructed 12/30/18   DMarjie Skiff MD    hydrALAZINE (APRESOLINE) 25 MG tablet Take 1 tablet (25 mg total) by mouth every 8 (eight) hours. 10/02/15   BLeanor Kail PA  Incontinence Supply Disposable (DEPEND ADJUSTABLE UNDERWEAR LG) MISC 1 application by Does not apply route as needed. 11/08/19   TBonnita Hollow MD  insulin glargine (LANTUS SOLOSTAR) 100 UNIT/ML Solostar Pen Inject 40 Units into the skin daily. ADMINISTER 30 UNITS UNDER THE SKIN TWICE DAILY 11/08/19   TBonnita Hollow MD  isosorbide mononitrate (IMDUR) 30 MG 24 hr tablet TAKE 1 TABLET(30 MG) BY MOUTH DAILY 10/16/19   ADoristine MangoL, DO  ivabradine (CORLANOR) 5 MG TABS tablet Take 1 tablet (5 mg total) by mouth 2 (two) times daily with a meal. 10/02/15   BLeanor Kail PA  Lancet Device MISC To be use with glucometer 11/02/17   Diallo, Earna Coder, MD  Lancets (ONETOUCH ULTRASOFT) lancets USE AS DIRECTED 04/21/18   Diallo, Earna Coder, MD  nitroGLYCERIN (NITROSTAT) 0.4 MG SL tablet Place 1 tablet (0.4 mg total) under the tongue every 5 (five) minutes as needed for chest pain. 11/08/19   Bonnita Hollow, MD  pantoprazole (PROTONIX) 40 MG tablet Take 1 tablet (40 mg total) by mouth daily. 12/21/15   Hongalgi, Lenis Dickinson, MD  prednisoLONE acetate (PRED FORTE) 1 % ophthalmic suspension SHAKE LQ AND INT 1 GTT IN OD QID FOR 2 WKS 05/04/19   [provider]  RESTASIS 0.05 % ophthalmic emulsion INT 1 GTT IN OU BID 03/15/19   [provider]  spironolactone (ALDACTONE) 25 MG tablet Take 1 tablet (25 mg total) by mouth daily. 10/02/15   Bhagat, Crista Luria, PA  sucralfate (CARAFATE) 1 g tablet Take 1 tablet (1 g total) by mouth 4 (four) times daily -  with meals and at bedtime. 03/16/16   Dorie Rank, MD  trimethoprim-polymyxin b (POLYTRIM) ophthalmic solution INT 1 GTT OU QID FOR 2 DAYS AFTER EACH MONTHLY EYE INJECTION 03/29/19   [provider]  furosemide (LASIX) 40 MG tablet TAKE 1 TABLET BY MOUTH EVERY DAY 10/21/18   Marjie Skiff, MD     Allergies    Tape  Review of Systems   Review of Systems  Neurological: Positive for weakness.  All other systems reviewed and are negative.   Physical Exam Updated Vital Signs BP 135/80   Pulse (!) 103   Temp 100.1 F (37.8 C) (Oral)   Resp (!) 21   SpO2 97%   Physical Exam Vitals and nursing note reviewed.  Constitutional:      Appearance: He is well-developed.  HENT:     Head: Normocephalic.     Mouth/Throat:     Mouth: Mucous membranes are moist.  Eyes:     Pupils: Pupils are equal, round, and reactive to light.  Cardiovascular:     Rate and Rhythm: Normal rate.  Pulmonary:     Effort: Pulmonary effort is normal.  Abdominal:     General: There is no distension.  Musculoskeletal:        General: Normal range of motion.     Cervical back: Normal range of motion.  Skin:    General: Skin is warm.  Neurological:     Mental Status: He is alert and oriented to person, place, and time.  Psychiatric:        Mood and Affect: Mood normal.     ED Results / Procedures / Treatments   Labs (all labs ordered are listed, but only abnormal results are displayed) Labs Reviewed  SARS CORONAVIRUS 2 BY RT PCR (HOSPITAL ORDER, [facility])  CBC WITH DIFFERENTIAL/PLATELET  COMPREHENSIVE METABOLIC PANEL  URINALYSIS, ROUTINE W REFLEX MICROSCOPIC    EKG None  Radiology CT Head Wo Contrast  Result Date: 02/27/2020 CLINICAL DATA:  Mental status change. EXAM: CT HEAD WITHOUT CONTRAST TECHNIQUE: Contiguous axial images were obtained from the base of the skull through the vertex without intravenous contrast. COMPARISON:  December 24, 2015 FINDINGS: Brain: No hemorrhage. No extraaxial collection.No midline shift. There is atrophy.The basal cisterns are unremarkable. Patchy and confluent areas of decreased attenuation are noted throughout the deep and periventricular white matter of the cerebral hemispheres bilaterally, compatible with chronic microvascular  ischemic disease. There is an old left MCA territory infarct. There is an age-indeterminate  right basal ganglia infarct, new since 2017. The brainstem is unremarkable. The cerebellum is unremarkable. The sella is unremarkable. Vascular: Extensive intracranial vascular calcifications are noted. Skull: The calvarium is unremarkable. The skull base is unremarkable. The visualized upper cervical spine is unremarkable. Sinuses/Orbits: The visualized orbits are unremarkable. There is mild mucosal thickening of the bilateral maxillary sinuses. The mastoid air cells are clear. Other: The visualized parotid gland is unremarkable. There is no scalp soft tissue swelling. IMPRESSION: 1. No acute intracranial abnormality. 2. Atrophy and chronic microvascular ischemic disease. 3. Old left MCA territory infarct. 4. Age-indeterminate right basal ganglia infarct, new since 2017. Electronically Signed   By: Constance Holster M.D.   On: 02/27/2020 22:21   DG Chest Port 1 View  Result Date: 02/27/2020 CLINICAL DATA:  Weakness EXAM: PORTABLE CHEST 1 VIEW COMPARISON:  08/17/2016 FINDINGS: Heart and mediastinal contours are within normal limits. No focal opacities or effusions. No acute bony abnormality. IMPRESSION: No active disease. Electronically Signed   By: Rolm Baptise M.D.   On: 02/27/2020 22:32    Procedures Procedures (including critical care time)  Medications Ordered in ED Medications - No data to display  ED Course  I have reviewed the triage vital signs and the nursing notes.  Pertinent labs & imaging results that were available during my care of the patient were reviewed by me and considered in my medical decision making (see chart for details).    MDM Rules/Calculators/A&P                          MDM: Labs ordered. Pt's care turned over to Charlann Lange [facility] at 12 midnight Final Clinical Impression(s) / ED Diagnoses Final diagnoses:  Weakness    Rx / DC Orders ED Discharge Orders    None        Sidney Ace 02/27/20 2341    Truddie Hidden, MD 02/28/20 1246

## 2020-02-28 ENCOUNTER — Encounter (INDEPENDENT_AMBULATORY_CARE_PROVIDER_SITE_OTHER): Payer: Medicare Other | Admitting: Ophthalmology

## 2020-02-28 ENCOUNTER — Telehealth: Payer: Self-pay | Admitting: Oncology

## 2020-02-28 DIAGNOSIS — U071 COVID-19: Secondary | ICD-10-CM | POA: Diagnosis not present

## 2020-02-28 LAB — URINALYSIS, ROUTINE W REFLEX MICROSCOPIC
Bacteria, UA: NONE SEEN
Bilirubin Urine: NEGATIVE
Glucose, UA: NEGATIVE mg/dL
Hgb urine dipstick: NEGATIVE
Ketones, ur: NEGATIVE mg/dL
Leukocytes,Ua: NEGATIVE
Nitrite: NEGATIVE
Protein, ur: 30 mg/dL — AB
Specific Gravity, Urine: 1.014 (ref 1.005–1.030)
pH: 5 (ref 5.0–8.0)

## 2020-02-28 MED ORDER — METHYLPREDNISOLONE SODIUM SUCC 125 MG IJ SOLR: 125.0000 mg | Freq: Once | INTRAMUSCULAR | Status: DC | PRN

## 2020-02-28 MED ORDER — EPINEPHRINE 0.3 MG/0.3ML IJ SOAJ: 0.3000 mg | Freq: Once | INTRAMUSCULAR | Status: DC | PRN

## 2020-02-28 MED ORDER — SODIUM CHLORIDE 0.9 % IV SOLN: INTRAVENOUS | Status: DC | PRN

## 2020-02-28 MED ORDER — SODIUM CHLORIDE 0.9 % IV SOLN
1200.0000 mg | Freq: Once | INTRAVENOUS | Status: AC
  Administered 2020-02-28: 1200 mg via INTRAVENOUS
  Filled 2020-02-28: qty 10

## 2020-02-28 MED ORDER — DIPHENHYDRAMINE HCL 50 MG/ML IJ SOLN: 50.0000 mg | Freq: Once | INTRAMUSCULAR | Status: DC | PRN

## 2020-02-28 MED ORDER — FAMOTIDINE IN NACL 20-0.9 MG/50ML-% IV SOLN: 20.0000 mg | Freq: Once | INTRAVENOUS | Status: DC | PRN

## 2020-02-28 MED ORDER — ACETAMINOPHEN 500 MG PO TABS
1000.0000 mg | ORAL_TABLET | Freq: Once | ORAL | Status: AC
  Administered 2020-02-28: 1000 mg via ORAL
  Filled 2020-02-28: qty 2

## 2020-02-28 MED ORDER — ALBUTEROL SULFATE HFA 108 (90 BASE) MCG/ACT IN AERS: 2.0000 | INHALATION_SPRAY | Freq: Once | RESPIRATORY_TRACT | Status: DC | PRN

## 2020-02-28 NOTE — ED Notes (Signed)
Patient is alert oriented to time and place, states he is having back pain, patient repositioned on stretcher and new linens applied.

## 2020-02-28 NOTE — ED Notes (Signed)
Ambulated patient in room with walker sat on RN stayed 96-98% , patient states he felt sob however was able to speak in complete sentences.

## 2020-02-28 NOTE — ED Notes (Signed)
Electronic fact sheet and paper consent obtained for Regen COV administration. Daughter at bedside signed d/t pt's confusion and weakness

## 2020-02-28 NOTE — Discharge Instructions (Signed)
Take Tylenol for any fever or aches. Push fluids to avoid dehydration.   You will need to isolate for the next 10 days as long as there is no fever during the last 3 days. Recommendations on isolation and keeping yourself and family safe are provided.   Return to the emergency department with any new or concerning symptoms - Shortness of breath, high fever, confusion, chest pain or new concern.

## 2020-02-28 NOTE — ED Notes (Signed)
Daughter called [name] [phone] and updated given she will be coming to get patient.

## 2020-03-04 ENCOUNTER — Other Ambulatory Visit: Payer: Self-pay

## 2020-03-04 MED ORDER — TRULICITY 0.75 MG/0.5ML ~~LOC~~ SOAJ: SUBCUTANEOUS | 3 refills | Status: DC

## 2020-03-05 ENCOUNTER — Emergency Department (HOSPITAL_COMMUNITY)
Admission: EM | Admit: 2020-03-05 | Discharge: 2020-03-05 | Disposition: A | Discharge status: Home / Self Care | Payer: Medicare Other | Attending: Emergency Medicine | Admitting: Emergency Medicine

## 2020-03-05 ENCOUNTER — Encounter (HOSPITAL_COMMUNITY): Payer: Self-pay

## 2020-03-05 ENCOUNTER — Emergency Department (HOSPITAL_COMMUNITY): Payer: Medicare Other

## 2020-03-05 ENCOUNTER — Other Ambulatory Visit: Payer: Self-pay

## 2020-03-05 DIAGNOSIS — R000 Tachycardia, unspecified: Secondary | ICD-10-CM | POA: Diagnosis not present

## 2020-03-05 DIAGNOSIS — Z955 Presence of coronary angioplasty implant and graft: Secondary | ICD-10-CM | POA: Diagnosis not present

## 2020-03-05 DIAGNOSIS — I5043 Acute on chronic combined systolic (congestive) and diastolic (congestive) heart failure: Secondary | ICD-10-CM | POA: Diagnosis not present

## 2020-03-05 DIAGNOSIS — Z7901 Long term (current) use of anticoagulants: Secondary | ICD-10-CM | POA: Diagnosis not present

## 2020-03-05 DIAGNOSIS — J1282 Pneumonia due to coronavirus disease 2019: Secondary | ICD-10-CM | POA: Diagnosis not present

## 2020-03-05 DIAGNOSIS — U071 COVID-19: Secondary | ICD-10-CM | POA: Diagnosis not present

## 2020-03-05 DIAGNOSIS — Z87891 Personal history of nicotine dependence: Secondary | ICD-10-CM | POA: Diagnosis not present

## 2020-03-05 DIAGNOSIS — Z79899 Other long term (current) drug therapy: Secondary | ICD-10-CM | POA: Insufficient documentation

## 2020-03-05 DIAGNOSIS — R109 Unspecified abdominal pain: Secondary | ICD-10-CM | POA: Diagnosis not present

## 2020-03-05 DIAGNOSIS — I2510 Atherosclerotic heart disease of native coronary artery without angina pectoris: Secondary | ICD-10-CM | POA: Diagnosis not present

## 2020-03-05 DIAGNOSIS — R531 Weakness: Secondary | ICD-10-CM | POA: Diagnosis not present

## 2020-03-05 DIAGNOSIS — I130 Hypertensive heart and chronic kidney disease with heart failure and stage 1 through stage 4 chronic kidney disease, or unspecified chronic kidney disease: Secondary | ICD-10-CM | POA: Insufficient documentation

## 2020-03-05 DIAGNOSIS — R0602 Shortness of breath: Secondary | ICD-10-CM | POA: Diagnosis not present

## 2020-03-05 DIAGNOSIS — E119 Type 2 diabetes mellitus without complications: Secondary | ICD-10-CM | POA: Insufficient documentation

## 2020-03-05 DIAGNOSIS — N1831 Chronic kidney disease, stage 3a: Secondary | ICD-10-CM | POA: Insufficient documentation

## 2020-03-05 DIAGNOSIS — Z743 Need for continuous supervision: Secondary | ICD-10-CM | POA: Diagnosis not present

## 2020-03-05 DIAGNOSIS — R5381 Other malaise: Secondary | ICD-10-CM | POA: Diagnosis not present

## 2020-03-05 LAB — CBC WITH DIFFERENTIAL/PLATELET
Abs Immature Granulocytes: 0.04 10*3/uL (ref 0.00–0.07)
Basophils Absolute: 0 10*3/uL (ref 0.0–0.1)
Basophils Relative: 0 %
Eosinophils Absolute: 0 10*3/uL (ref 0.0–0.5)
Eosinophils Relative: 0 %
HCT: 36.2 % — ABNORMAL LOW (ref 39.0–52.0)
Hemoglobin: 11.5 g/dL — ABNORMAL LOW (ref 13.0–17.0)
Immature Granulocytes: 1 %
Lymphocytes Relative: 16 %
Lymphs Abs: 1.1 10*3/uL (ref 0.7–4.0)
MCH: 24.8 pg — ABNORMAL LOW (ref 26.0–34.0)
MCHC: 31.8 g/dL (ref 30.0–36.0)
MCV: 78 fL — ABNORMAL LOW (ref 80.0–100.0)
Monocytes Absolute: 0.7 10*3/uL (ref 0.1–1.0)
Monocytes Relative: 10 %
Neutro Abs: 5.1 10*3/uL (ref 1.7–7.7)
Neutrophils Relative %: 73 %
Platelets: 214 10*3/uL (ref 150–400)
RBC: 4.64 MIL/uL (ref 4.22–5.81)
RDW: 14 % (ref 11.5–15.5)
WBC: 6.9 10*3/uL (ref 4.0–10.5)
nRBC: 0 % (ref 0.0–0.2)

## 2020-03-05 LAB — URINALYSIS, ROUTINE W REFLEX MICROSCOPIC
Bacteria, UA: NONE SEEN
Bilirubin Urine: NEGATIVE
Glucose, UA: NEGATIVE mg/dL
Hgb urine dipstick: NEGATIVE
Ketones, ur: NEGATIVE mg/dL
Leukocytes,Ua: NEGATIVE
Nitrite: NEGATIVE
Protein, ur: 100 mg/dL — AB
Specific Gravity, Urine: 1.016 (ref 1.005–1.030)
pH: 5 (ref 5.0–8.0)

## 2020-03-05 LAB — COMPREHENSIVE METABOLIC PANEL
ALT: 31 U/L (ref 0–44)
AST: 27 U/L (ref 15–41)
Albumin: 3 g/dL — ABNORMAL LOW (ref 3.5–5.0)
Alkaline Phosphatase: 86 U/L (ref 38–126)
Anion gap: 9 (ref 5–15)
BUN: 10 mg/dL (ref 8–23)
CO2: 22 mmol/L (ref 22–32)
Calcium: 8.3 mg/dL — ABNORMAL LOW (ref 8.9–10.3)
Chloride: 107 mmol/L (ref 98–111)
Creatinine, Ser: 1.01 mg/dL (ref 0.61–1.24)
GFR calc Af Amer: >60 mL/min (ref >60)
GFR calc non Af Amer: >60 mL/min (ref >60)
Glucose, Bld: 191 mg/dL — ABNORMAL HIGH (ref 70–99)
Potassium: 4 mmol/L (ref 3.5–5.1)
Sodium: 138 mmol/L (ref 135–145)
Total Bilirubin: 0.8 mg/dL (ref 0.3–1.2)
Total Protein: 6.4 g/dL — ABNORMAL LOW (ref 6.5–8.1)

## 2020-03-05 LAB — LIPASE, BLOOD: Lipase: 58 U/L — ABNORMAL HIGH (ref 11–51)

## 2020-03-05 MED ORDER — CARVEDILOL 12.5 MG PO TABS
25.0000 mg | ORAL_TABLET | Freq: Two times a day (BID) | ORAL | Status: DC
  Administered 2020-03-05: 25 mg via ORAL
  Filled 2020-03-05: qty 2

## 2020-03-05 MED ORDER — ONDANSETRON 4 MG PO TBDP: 4.0000 mg | ORAL_TABLET | Freq: Three times a day (TID) | ORAL | 0 refills | Status: DC | PRN

## 2020-03-05 MED ORDER — SODIUM CHLORIDE 0.9 % IV BOLUS
500.0000 mL | Freq: Once | INTRAVENOUS | Status: AC
  Administered 2020-03-05: 500 mL via INTRAVENOUS

## 2020-03-05 MED ORDER — HYDRALAZINE HCL 25 MG PO TABS
25.0000 mg | ORAL_TABLET | Freq: Three times a day (TID) | ORAL | Status: DC
  Administered 2020-03-05: 25 mg via ORAL
  Filled 2020-03-05: qty 1

## 2020-03-05 NOTE — ED Notes (Signed)
Reviewed discharge instructions. Pt understanding of them.  Patient aware of prescriptions given.Pt aware to come back if symptoms worsen and to follow up with PCP in a week.

## 2020-03-05 NOTE — ED Provider Notes (Signed)
[facility] EMERGENCY DEPARTMENT Provider Note   CSN: [id number] Arrival date & time: 03/05/20  1248     Antonio Antonio Guerra Chief Complaint  Patient presents with  . Weakness    Antonio Antonio Guerra Antonio Guerra is a 69 y.o. male.  Patient with Antonio Antonio Guerra of HFrEF on 57m furosemide Antonio Guerra, Antonio Antonio Guerra Antonio Guerra, Antonio Antonio Guerra Antonio Guerra, Antonio Antonio Guerra Antonio Guerra with coronavirus, denies vaccination --presents for generalized weakness.  Patient states that he has been feeling poorly since ED visit on 02/27/2020 when he was diagnosed with coronavirus.  His wife is currently hospitalized with the same.  EMS was called for generalized weakness.  No reported falls.  Patient reports occasional cough and abdominal pain.  He has occasional diarrhea.  No chest pain or shortness of breath.  He is concerned that he may be developing pneumonia.  He reports decreased appetite but has been drinking fluids at home.  No lower extremity swelling.        Past Medical Antonio Antonio Guerra:  Diagnosis Date  . Arthritis    "all over" (12/20/2015)  . Antonio Antonio Guerra lower back pain   . Antonio Antonio Gu[place] systolic congestive heart failure ([place]    a. 04/2014 Echo: EF 20-25%.  . Antonio Antonio Guerra artery disease    a. 2011 [place] x 2 with PCI: [place]  b. 04/2014 Cath/PCI: LM 10-20, LAD 40p, [id number](3.5x38 Xience DES), 50apical, LCX 20 diffuse, RI 50p, 46m35m, RCA 40-56m,53m.  . Fibromyalgia   . Hyperlipidemia   . Hypertension   . Hypertensive urgency 09/27/2015  . Myocardial infarction (HCC) [place]  . Stroke (HCC)[facility]   "right hand weaker since" (12/20/2015)  . Type II diabetes mellitus (HCC)[facility] Patient Active Problem List   Diagnosis Date Noted  . Stage 3a Antonio Antonio Guerra kidney disease 11/08/2019  . Antonio Antonio Guerra of angina 11/08/2019  . Intermittent diarrhea 11/08/2019  . Screening for colon cancer 11/08/2019  . Encounter for immunization 09/23/2018  . Altered mental  status   . Type 2 diabetes mellitus with vascular disease (HCC) [place]01/2017  . Alcohol intoxication (HCC) [place]01/2017  . Substance-induced anxiety disorder (HCC) [place]01/2017  . Epigastric pain   . Elevated troponin   . Type 2 diabetes mellitus (HCC) [place]12/2017  . Acute on Antonio Antonio Guerra combined systolic (congestive) and diastolic (congestive) heart failure (HCC) [place]10/2017  . Ischemic cardiomyopathy   . Hyperlipidemia   . Antonio Antonio Guerra artery disease   . Antonio Antonio Guerra systolic congestive heart failure (HCC) [place] Stroke (HCC)[facility] Past Surgical Antonio Antonio Guerra:  Procedure Laterality Date  . ANKLE FRACTURE SURGERY Right   . CARDIAC CATHETERIZATION N/A 09/30/2015   Procedure: Left Heart Cath and Antonio Antonio Guerra Angiography;  Surgeon: ChrisBurnell Blanks  [facility];  Service: Cardiovascular;  Laterality: N/A;  . Antonio Antonio Guerra ANGIOPLASTY WITH STENT PLACEMENT     "total of 3 stents" (12/20/2015)  . FRACTIONAL FLOW RESERVE WIRE  05/11/2014   Procedure: FRACTIONAL FLOW RESERVE WIRE;  Surgeon: ChrisBurnell Blanks  [facility];  Service: Cardiovascular;;  . FRACTURE SURGERY    . LEFT HEART CATHETERIZATION WITH Antonio Antonio Guerra/GRAFT ANGIOGRAM N/A 05/11/2014   Procedure: LEFT HEART CATHETERIZATION WITH CORONBeatrix Fettersrgeon: ChrisBurnell Blanks  [facility];  Service: Cardiovascular;  Laterality: N/A;  . PERCUTANEOUS Antonio Antonio Guerra STENT INTERVENTION (PCI-S)  05/11/2014   Procedure: PERCUTANEOUS Antonio Antonio Guerra STENT INTERVENTION (PCI-S);  Surgeon:  Burnell Blanks, MD;  [facility] LAB;  Service: Cardiovascular;;       Family Antonio Antonio Guerra  Problem Relation Age of Onset  . Diabetes Mother   . CAD Neg Hx   . Stomach cancer Neg Hx   . Colon cancer Neg Hx   . Esophageal cancer Neg Hx   . Pancreatic cancer Neg Hx     Social Antonio Antonio Guerra   Tobacco Use  . Smoking status: Former Research scientist (life sciences)  . Smokeless tobacco: Never Used  . Tobacco comment: 12/20/2015 "smoked cigarettes 30-83yr ago"  Vaping Use  . Vaping Use: Never used  Substance Use Topics  . Alcohol use: Not Currently    Alcohol/week: 5.0 standard drinks    Types: 5 Cans of beer per week    Comment: stopped 4 yrs ago   . Drug use: No    Home Medications Prior to Admission medications   Medication Sig Start Date End Date Taking? Authorizing Provider  ammonium lactate (AMLACTIN) 12 % lotion Apply 1 application topically 2 (two) times Antonio Guerra. Patient not taking: Reported on 02/27/2020 06/02/19   GMarzetta Board DPM  atorvastatin (LIPITOR) 80 MG tablet TAKE 1 TABLET(80 MG) BY MOUTH AT BEDTIME Patient taking differently: Take 80 mg by mouth at bedtime.  12/28/19   TBonnita Hollow MD  BD PEN NEEDLE NANO U/F 32G X 4 MM MISC AS DIRECTED TWICE Antonio Guerra 07/29/19   TBonnita Hollow MD  blood glucose meter kit and supplies Dispense based on patient and insurance preference. Use up to four times Antonio Guerra as directed. (FOR ICD-9 250.00, 250.01). 10/02/15   Rai, Ripudeep K, MD  Blood Glucose Monitoring Suppl (ONETOUCH VERIO) w/Device KIT 30 Units by Does not apply route Antonio Guerra. 11/02/17   Diallo, AEarna Coder MD  carvedilol (COREG) 25 MG tablet TAKE 1 TABLET(25 MG) BY MOUTH TWICE Antonio Guerra Patient taking differently: Take 25 mg by mouth 2 (two) times Antonio Guerra with a meal.  02/16/20   SZola Button MD  clopidogrel (Antonio Guerra) 75 MG tablet TAKE 1 TABLET(75 MG) BY MOUTH Antonio Guerra Patient taking differently: Take 75 mg by mouth Antonio Guerra.  10/16/19   Anderson, Chelsey L, DO  clotrimazole-betamethasone (LOTRISONE) cream Apply to both feet and between toes bid x 4 weeks. Patient not taking: Reported on 02/27/2020 06/02/19   GMarzetta Board DPM  Continuous Blood Gluc Receiver (FREESTYLE LIBRE 14 DAY READER) DEVI 1 Device by Does not apply route every morning. 11/08/19   TBonnita Hollow MD  Dulaglutide (TRULICITY) 01.24MPY/0.9XISOPN INJECT 0.75MG INTO THE SKIN ONCE A WEEK 03/04/20   SZola Button MD  Fiber Formula CAPS Take 1 tablet by mouth every  morning. Patient not taking: Reported on 02/27/2020 11/08/19   TBonnita Hollow MD  furosemide (LASIX) 40 MG tablet TAKE 1 TABLET(40 MG) BY MOUTH Antonio Guerra Patient taking differently: Take 40 mg by mouth Antonio Guerra.  10/18/19   Anderson, Chelsey L, DO  gabapentin (NEURONTIN) 400 MG capsule Take 1 capsule (400 mg total) by mouth 2 (two) times Antonio Guerra. TAKE 1 CAPSULE(300 MG) BY MOUTH THREE TIMES Antonio Guerra Patient taking differently: Take 400 mg by mouth 2 (two) times Antonio Guerra.  11/07/19   TBonnita Hollow MD  glucose blood (ONETOUCH VERIO) test strip Use as instructed 12/30/18   DMarjie Skiff MD  hydrALAZINE (APRESOLINE) 25 MG tablet Take 1 tablet (25 mg total) by mouth every 8 (eight) hours. Patient not taking: Reported on 02/27/2020 10/02/15   BLeanor Kail PA  Incontinence Supply Disposable (DEPEND ADJUSTABLE UNDERWEAR  LG) MISC 1 application by Does not apply route as needed. 11/08/19   Bonnita Hollow, MD  insulin glargine (LANTUS SOLOSTAR) 100 UNIT/ML Solostar Pen Inject 40 Units into the skin Antonio Guerra. ADMINISTER 30 UNITS UNDER THE SKIN TWICE Antonio Guerra Patient taking differently: Inject 40 Units into the skin Antonio Guerra.  11/08/19   Bonnita Hollow, MD  isosorbide mononitrate (IMDUR) 30 MG 24 hr tablet TAKE 1 TABLET(30 MG) BY MOUTH Antonio Guerra Patient taking differently: Take 30 mg by mouth Antonio Guerra.  10/16/19   Ouida Sills, Chelsey L, DO  ivabradine (CORLANOR) 5 MG TABS tablet Take 1 tablet (5 mg total) by mouth 2 (two) times Antonio Guerra with a meal. Patient not taking: Reported on 02/27/2020 10/02/15   Leanor Kail, PA  Lancet Device MISC To be use with glucometer 11/02/17   Diallo, Earna Coder, MD  Lancets (ONETOUCH ULTRASOFT) lancets USE AS DIRECTED 04/21/18   Diallo, Earna Coder, MD  nitroGLYCERIN (NITROSTAT) 0.4 MG SL tablet Place 1 tablet (0.4 mg total) under the tongue every 5 (five) minutes as needed for chest pain. 11/08/19   Bonnita Hollow, MD  pantoprazole (PROTONIX) 40 MG tablet Take 1 tablet (40 mg total) by mouth  Antonio Guerra. Patient not taking: Reported on 02/27/2020 12/21/15   Modena Jansky, MD  spironolactone (ALDACTONE) 25 MG tablet Take 1 tablet (25 mg total) by mouth Antonio Guerra. Patient not taking: Reported on 02/27/2020 10/02/15   Leanor Kail, PA  sucralfate (CARAFATE) 1 g tablet Take 1 tablet (1 g total) by mouth 4 (four) times Antonio Guerra -  with meals and at bedtime. Patient not taking: Reported on 02/27/2020 03/16/16   Dorie Rank, MD  furosemide (LASIX) 40 MG tablet TAKE 1 TABLET BY MOUTH EVERY DAY 10/21/18   Marjie Skiff, MD    Allergies    Tape  Review of Systems   Review of Systems  Constitutional: Positive for fatigue. Negative for chills and fever.  HENT: Positive for congestion. Negative for rhinorrhea and sore throat.   Eyes: Negative for redness.  Respiratory: Positive for cough.   Cardiovascular: Negative for chest pain.  Gastrointestinal: Positive for abdominal pain and diarrhea. Negative for nausea and vomiting.  Genitourinary: Negative for dysuria and hematuria.  Musculoskeletal: Negative for myalgias.  Skin: Negative for rash.  Neurological: Negative for headaches.    Physical Exam Updated Vital Signs BP (!) 165/95 (BP Location: Right Arm)   Pulse 97   Temp 98.3 F (36.8 C) (Oral)   Resp (!) 22   SpO2 100%   Physical Exam Vitals and nursing note reviewed.  Constitutional:      Appearance: He is well-developed.  HENT:     Head: Normocephalic and atraumatic.     Right Ear: External ear normal.     Left Ear: External ear normal.  Eyes:     General:        Right eye: No discharge.        Left eye: No discharge.     Conjunctiva/sclera: Conjunctivae normal.  Cardiovascular:     Rate and Rhythm: Normal rate and regular rhythm.     Heart sounds: Normal heart sounds.  Pulmonary:     Effort: Pulmonary effort is normal.     Breath sounds: Rales (mild, scattered) present.  Abdominal:     Palpations: Abdomen is soft.     Tenderness: There is abdominal tenderness.  There is no guarding or rebound.     Comments: Patient winces with palpation over the abdomen without focal pain.   Musculoskeletal:  Cervical back: Normal range of motion and neck supple.     Right lower leg: No edema.     Left lower leg: No edema.  Skin:    General: Skin is warm and dry.  Neurological:     Mental Status: He is alert.     ED Results / Procedures / Treatments   Labs (all labs ordered are listed, but only abnormal results are displayed) Labs Reviewed  CBC WITH DIFFERENTIAL/PLATELET - Abnormal; Notable for the following components:      Result Value   Hemoglobin 11.5 (*)    HCT 36.2 (*)    MCV 78.0 (*)    MCH 24.8 (*)    All other components within normal limits  COMPREHENSIVE METABOLIC PANEL - Abnormal; Notable for the following components:   Glucose, Bld 191 (*)    Calcium 8.3 (*)    Total Protein 6.4 (*)    Albumin 3.0 (*)    All other components within normal limits  LIPASE, BLOOD - Abnormal; Notable for the following components:   Lipase 58 (*)    All other components within normal limits  URINALYSIS, ROUTINE W REFLEX MICROSCOPIC   ED ECG REPORT   Date: 03/05/2020  Rate: 97  Rhythm: normal sinus rhythm  QRS Axis: left  Intervals: normal  ST/T Wave abnormalities: nonspecific T wave changes  Conduction Disutrbances:left anterior fascicular block  Narrative Interpretation:   Old EKG Reviewed: unchanged from last week  I have personally reviewed the EKG tracing and disagree with the computerized printout as noted.    Radiology DG Chest Port 1 View  Result Date: 03/05/2020 CLINICAL DATA:  Weakness, COVID-19 positive, shortness of breath, not feeling better since diagnosis 1 week ago EXAM: PORTABLE CHEST 1 VIEW COMPARISON:  Portable exam 1326 hours compared to 02/27/2020 FINDINGS: Normal heart size, mediastinal contours, and pulmonary vascularity. Patchy bibasilar infiltrates consistent with pneumonia. Upper lungs clear. No pleural effusion or  pneumothorax. Osseous structures unremarkable. IMPRESSION: Patchy bibasilar infiltrates consistent with multifocal pneumonia. Electronically Signed   By: Lavonia Dana M.D.   On: 03/05/2020 13:40    Procedures Procedures (including critical care time)  Medications Ordered in ED Medications  sodium chloride 0.9 % bolus 500 mL (0 mLs Intravenous Stopped 03/05/20 1430)    ED Course  I have reviewed the triage vital signs and the nursing notes.  Pertinent labs & imaging results that were available during my care of the patient were reviewed by me and considered in my medical decision making (see chart for details).  Patient seen and examined. Work-up initiated.  Overall the patient appears well.  No hypoxia on room air.  Will check lab work, chest x-ray, EKG. we will give 500 cc fluid bolus.  At this point he does not appear to be significantly fluid overloaded.  Vital signs reviewed and are as follows: BP (!) 165/95 (BP Location: Right Arm)   Pulse 97   Temp 98.3 F (36.8 C) (Oral)   Resp (!) 22   SpO2 100%   3:15 PM Patient updated on results, continues to do well.  He is spoken with family by telephone.  Labs are reassuring.  Awaiting pulse ox check on ambulation.  On recheck, oxygen level is still 100% on room air.  Patient has previously received monoclonal antibodies.  Plan: If patient is able to ambulate well, can be discharged home.    Discussed return with worsening shortness of breath, trouble breathing.  Zephaniah Lubrano was evaluated in Emergency Department  on 03/05/2020 for the symptoms described in the Antonio Antonio Guerra of present Antonio Guerra. He was evaluated in the context of the global COVID-19 pandemic, which necessitated consideration that the patient might be at risk for infection with the SARS-CoV-2 virus that causes COVID-19. Institutional protocols and algorithms that pertain to the evaluation of patients at risk for COVID-19 are in a state of rapid change based on information  released by regulatory bodies including the CDC and federal and state organizations. These policies and algorithms were followed during the patient's care in the ED.    MDM Rules/Calculators/A&P                          Pt with COVID pna, no respiratory failure. Labs reassuring. Multiple comorbidities however no chest pain or concerns for ACS today.  CHF appears to be compensated with normal potassium.  Patient given small fluid bolus of the ED and has urinated a small amount of dark urine.  No definite indications for admission at this time, pending completion of evaluation in the ED.  Final Clinical Impression(s) / ED Diagnoses Final diagnoses:  Pneumonia due to COVID-19 virus    Rx / DC Orders ED Discharge Orders    None       Carlisle Cater, PA-C 03/05/20 1522    Maudie Flakes, MD 03/05/20 [phone]

## 2020-03-05 NOTE — Telephone Encounter (Signed)
Spoke with pts daughter she stated that Mr. Antonio Guerra has COVID and pneumonia, that the whole house is sick at this time. She stated that she would have to make an appt around the first of September. Salvatore Marvel, CMA

## 2020-03-05 NOTE — ED Triage Notes (Addendum)
Pt BIB EMS from home due to weakness. Pt tested + for covid Tuesday was seen and treated here. Pt reports he is here today because he feel like he is getting weaker. VSS.Pt states he wanted to make sure he doesn't have pneumonia.

## 2020-03-05 NOTE — ED Notes (Signed)
Pt was given home medications for blood pressure. Pt was monitored for 20 mins before being discharged.

## 2020-03-05 NOTE — Telephone Encounter (Signed)
Attempted to reach pt.  On cell phone and daughter Lexine Baton phone. No answer and no voicemail set up. Will try later. Salvatore Marvel, CMA]

## 2020-03-05 NOTE — ED Notes (Signed)
Pt ambulated around room twice. Maintained spo2 98-100% on room air.

## 2020-03-05 NOTE — Discharge Instructions (Signed)
Return for new or worsening symptoms

## 2020-03-05 NOTE — ED Provider Notes (Addendum)
69 year old presents for evaluate of weakness. Care transferred from [place], [place] at shift change.  See his note for full HPI. Seen Tuesday last week Dx COVID, gotten MAB last week in ED. Generalized weakness. Tolerating PO intake at home. No CP, SOB. No unilateral weakness, HA, slurred speech, paresthesias. No gait abnormalities.   Plan to ambulate with pulse Ox, if no hypoxia can dc home. Follow up on UA  Physical Exam  BP (!) 193/108 (BP Location: Right Arm)   Pulse 90   Temp 98.9 F (37.2 C) (Oral)   Resp (!) 25   SpO2 100%   Physical Exam Vitals and nursing note reviewed.  Constitutional:      General: He is not in acute distress.    Appearance: He is not ill-appearing, toxic-appearing or diaphoretic.  HENT:     Head: Normocephalic and atraumatic.     Jaw: There is normal jaw occlusion.     Right Ear: Tympanic membrane, ear canal and external ear normal. There is no impacted cerumen. No hemotympanum. Tympanic membrane is not injected, scarred, perforated, erythematous, retracted or bulging.     Left Ear: Tympanic membrane, ear canal and external ear normal. There is no impacted cerumen. No hemotympanum. Tympanic membrane is not injected, scarred, perforated, erythematous, retracted or bulging.     Ears:     Comments: No Mastoid tenderness.    Nose:     Comments: Clear rhinorrhea and congestion to bilateral nares.  No sinus tenderness.    Mouth/Throat:     Comments: Posterior oropharynx clear.  Mucous membranes moist.  Tonsils without erythema or exudate.  Uvula midline without deviation.  No evidence of PTA or RPA.  No drooling, dysphasia or trismus.  Phonation normal. Neck:     Trachea: Trachea and phonation normal.     Meningeal: Brudzinski's sign and Kernig's sign absent.     Comments: No Neck stiffness or neck rigidity.  No meningismus.  No cervical lymphadenopathy. Cardiovascular:     Comments: No murmurs rubs or gallops. Pulmonary:     Comments: Clear to auscultation  bilaterally without wheeze, rhonchi or rales.  No accessory muscle usage.  Able speak in full sentences. Abdominal:     Comments: Soft, nontender without rebound or guarding.  No CVA tenderness.  Musculoskeletal:     Comments: Moves all 4 extremities without difficulty.  Lower extremities without edema, erythema or warmth.  Skin:    Comments: Brisk capillary refill.  No rashes or lesions.  Neurological:     Mental Status: He is alert.     Comments: Ambulatory in department without difficulty.  Cranial nerves II through XII grossly intact.  No facial droop.  No dysphasia. No ataxia with gait    ED Course/Procedures     Procedures Labs Reviewed  CBC WITH DIFFERENTIAL/PLATELET - Abnormal; Notable for the following components:      Result Value   Hemoglobin 11.5 (*)    HCT 36.2 (*)    MCV 78.0 (*)    MCH 24.8 (*)    All other components within normal limits  COMPREHENSIVE METABOLIC PANEL - Abnormal; Notable for the following components:   Glucose, Bld 191 (*)    Calcium 8.3 (*)    Total Protein 6.4 (*)    Albumin 3.0 (*)    All other components within normal limits  LIPASE, BLOOD - Abnormal; Notable for the following components:   Lipase 58 (*)    All other components within normal limits  URINALYSIS,  ROUTINE W REFLEX MICROSCOPIC - Abnormal; Notable for the following components:   Protein, ur 100 (*)    All other components within normal limits  CT Head Wo Contrast  Result Date: 02/27/2020 CLINICAL DATA:  Mental status change. EXAM: CT HEAD WITHOUT CONTRAST TECHNIQUE: Contiguous axial images were obtained from the base of the skull through the vertex without intravenous contrast. COMPARISON:  December 24, 2015 FINDINGS: Brain: No hemorrhage. No extraaxial collection.No midline shift. There is atrophy.The basal cisterns are unremarkable. Patchy and confluent areas of decreased attenuation are noted throughout the deep and periventricular white matter of the cerebral hemispheres  bilaterally, compatible with chronic microvascular ischemic disease. There is an old left MCA territory infarct. There is an age-indeterminate right basal ganglia infarct, new since 2017. The brainstem is unremarkable. The cerebellum is unremarkable. The sella is unremarkable. Vascular: Extensive intracranial vascular calcifications are noted. Skull: The calvarium is unremarkable. The skull base is unremarkable. The visualized upper cervical spine is unremarkable. Sinuses/Orbits: The visualized orbits are unremarkable. There is mild mucosal thickening of the bilateral maxillary sinuses. The mastoid air cells are clear. Other: The visualized parotid gland is unremarkable. There is no scalp soft tissue swelling. IMPRESSION: 1. No acute intracranial abnormality. 2. Atrophy and chronic microvascular ischemic disease. 3. Old left MCA territory infarct. 4. Age-indeterminate right basal ganglia infarct, new since 2017. Electronically Signed   By: Constance Holster M.D.   On: 02/27/2020 22:21   DG Chest Port 1 View  Result Date: 03/05/2020 CLINICAL DATA:  Weakness, COVID-19 positive, shortness of breath, not feeling better since diagnosis 1 week ago EXAM: PORTABLE CHEST 1 VIEW COMPARISON:  Portable exam 1326 hours compared to 02/27/2020 FINDINGS: Normal heart size, mediastinal contours, and pulmonary vascularity. Patchy bibasilar infiltrates consistent with pneumonia. Upper lungs clear. No pleural effusion or pneumothorax. Osseous structures unremarkable. IMPRESSION: Patchy bibasilar infiltrates consistent with multifocal pneumonia. Electronically Signed   By: Lavonia Dana M.D.   On: 03/05/2020 13:40   DG Chest Port 1 View  Result Date: 02/27/2020 CLINICAL DATA:  Weakness EXAM: PORTABLE CHEST 1 VIEW COMPARISON:  08/17/2016 FINDINGS: Heart and mediastinal contours are within normal limits. No focal opacities or effusions. No acute bony abnormality. IMPRESSION: No active disease. Electronically Signed   By: Rolm Baptise M.D.   On: 02/27/2020 22:31   MDM  69 year old presents for weakness, Onset with COVID Dx last week. Received MAB here in ED. Non focal neuro deficits on exam.  He has no chest pain, shortness of breath.  Previous provider ordered labs and imaging which I personally reviewed and interpreted.  He is tolerating p.o. intake here in ED.  Plan on ambulating with pulse ox, follow-up on UA and likely DC home.  UA without infection  Tolerating PO intake without difficulty  Patient able to ambulate without difficulty and Hypoxia.  Non focal neuro exam without deficits. Abd soft without rebound or guarding.   Patient has been hypertensive here in the emergency department.  He did not take his blood pressure medication earlier today.  Will give home dose.  He is nonfocal neuro exam without deficits.  No associated headache, nausea, vomiting, chest pain, shortness breath abdominal pain, unilateral weakness.  I will suspicion for hypertensive urgency or emergency.  The patient has been appropriately medically screened and/or stabilized in the ED. I have low suspicion for any other emergent medical condition which would require further screening, evaluation or treatment in the ED or require inpatient management.  Patient is hemodynamically  stable and in no acute distress.  Patient able to ambulate in department prior to ED.  Evaluation does not show acute pathology that would require ongoing or additional emergent interventions while in the emergency department or further inpatient treatment.  I have discussed the diagnosis with the patient and answered all questions.  Pain is been managed while in the emergency department and patient has no further complaints prior to discharge.  Patient is comfortable with plan discussed in room and is stable for discharge at this time.  I have discussed strict return precautions for returning to the emergency department.  Patient was encouraged to follow-up with  PCP/specialist refer to at discharge.      Patton Rabinovich A, PA-C 03/05/20 1649    Olin Gurski A, PA-C 03/05/20 1657    Drenda Freeze, MD 03/05/20 2216

## 2020-03-13 ENCOUNTER — Encounter (INDEPENDENT_AMBULATORY_CARE_PROVIDER_SITE_OTHER): Payer: Medicare Other | Admitting: Ophthalmology

## 2020-04-08 ENCOUNTER — Other Ambulatory Visit: Payer: Self-pay | Admitting: Family Medicine

## 2020-04-08 DIAGNOSIS — E1159 Type 2 diabetes mellitus with other circulatory complications: Secondary | ICD-10-CM

## 2020-04-10 ENCOUNTER — Other Ambulatory Visit: Payer: Self-pay | Admitting: Family Medicine

## 2020-04-22 ENCOUNTER — Other Ambulatory Visit: Payer: Self-pay | Admitting: Student in an Organized Health Care Education/Training Program

## 2020-04-22 DIAGNOSIS — I5022 Chronic systolic (congestive) heart failure: Secondary | ICD-10-CM

## 2020-05-01 ENCOUNTER — Ambulatory Visit (INDEPENDENT_AMBULATORY_CARE_PROVIDER_SITE_OTHER): Payer: Medicare Other | Admitting: Podiatry

## 2020-05-01 ENCOUNTER — Other Ambulatory Visit: Payer: Self-pay

## 2020-05-01 ENCOUNTER — Encounter: Payer: Self-pay | Admitting: Podiatry

## 2020-05-01 DIAGNOSIS — M79675 Pain in left toe(s): Secondary | ICD-10-CM | POA: Diagnosis not present

## 2020-05-01 DIAGNOSIS — E1142 Type 2 diabetes mellitus with diabetic polyneuropathy: Secondary | ICD-10-CM

## 2020-05-01 DIAGNOSIS — M79674 Pain in right toe(s): Secondary | ICD-10-CM

## 2020-05-01 DIAGNOSIS — B351 Tinea unguium: Secondary | ICD-10-CM

## 2020-05-05 NOTE — Progress Notes (Signed)
Subjective: Binyamin Nelis presents today at risk foot care. Pt has h/o NIDDM with chronic kidney disease and painful thick toenails that are difficult to trim. Pain interferes with ambulation. Aggravating factors include wearing enclosed shoe gear. Pain is relieved with periodic professional debridement.   Sadly, Mr.Guilbert lost his wife to complications from [id number]-82 last month.   He last checked his blood glucose on yesterday and it was "100-something".   [name], MD is patient's PCP.  Past Medical History:  Diagnosis Date  . Arthritis    "all over" (12/20/2015)  . Chronic lower back pain   . Chronic systolic congestive heart failure ([place])    a. 04/2014 Echo: EF 20-25%.  . Coronary artery disease    a. 2011 MI x 2 with PCI: stent x 2 (LAD and RI) @ [facility] in [place], [place];  b. 04/2014 Cath/PCI: LM 10-20, LAD 40p, 11m 872mSR(3.5x38 Xience DES), 50apical, LCX 20 diffuse, RI 50p, 2034mR, RCA 40-10m45md.  . Fibromyalgia   . Hyperlipidemia   . Hypertension   . Hypertensive urgency 09/27/2015  . Myocardial infarction (HCC)[place]15  . [facility] ([facility]   "right hand weaker since" (12/20/2015)  . Type II diabetes mellitus ([facility]   Patient Active Problem List   Diagnosis Date Noted  . Stage 3a chronic kidney disease (HCC)[place]/21/2021  . History of angina 11/08/2019  . Intermittent diarrhea 11/08/2019  . Screening for colon cancer 11/08/2019  . Encounter for immunization 09/23/2018  . Altered mental status   . Type 2 diabetes mellitus with vascular disease (HCC)[place]/07/2015  . Alcohol intoxication (HCC)[place]/07/2015  . Substance-induced anxiety disorder (HCC)[place]/07/2015  . Epigastric pain   . Elevated troponin   . Type 2 diabetes mellitus (HCC)[place]/06/2016  . Acute on chronic combined systolic (congestive) and diastolic (congestive) heart failure (HCC)[place]/04/2016  . Ischemic cardiomyopathy   . Hyperlipidemia   . Coronary artery disease   . Chronic systolic congestive heart  failure (HCC)[place]. [facility]  Current Outpatient Medications on File Prior to Visit  Medication Sig Dispense Refill  . ammonium lactate (AMLACTIN) 12 % lotion Apply 1 application topically 2 (two) times daily. 400 g 4  . atorvastatin (LIPITOR) 80 MG tablet TAKE 1 TABLET(80 MG) BY MOUTH AT BEDTIME 90 tablet 3  . BD PEN NEEDLE NANO U/F 32G X 4 MM MISC AS DIRECTED TWICE DAILY 100 each 3  . blood glucose meter kit and supplies Dispense based on patient and insurance preference. Use up to four times daily as directed. (FOR ICD-9 250.00, 250.01). 1 each 0  . Blood Glucose Monitoring Suppl (ONETOUCH VERIO) w/Device KIT 30 Units by Does not apply route daily. 1 kit 0  . carvedilol (COREG) 25 MG tablet TAKE 1 TABLET(25 MG) BY MOUTH TWICE DAILY (Patient taking differently: Take 25 mg by mouth 2 (two) times daily with a meal. ) 180 tablet 0  . clopidogrel (PLAVIX) 75 MG tablet TAKE 1 TABLET(75 MG) BY MOUTH DAILY 90 tablet 2  . clotrimazole-betamethasone (LOTRISONE) cream Apply to both feet and between toes bid x 4 weeks. 45 g 1  . Continuous Blood Gluc Receiver (FREESTYLE LIBRE 14 DAY READER) DEVI 1 Device by Does not apply route every morning. 1 each 3  . Dulaglutide (TRULICITY) 0.755.050LZ/7.6BHN INJECT 0.75MG INTO THE SKIN ONCE A WEEK 2 mL 3  . Fiber Formula CAPS Take 1 tablet by mouth every morning. 30 capsule  11  . furosemide (LASIX) 40 MG tablet TAKE 1 TABLET(40 MG) BY MOUTH DAILY (Patient taking differently: Take 40 mg by mouth daily. ) 90 tablet 2  . gabapentin (NEURONTIN) 400 MG capsule Take 1 capsule (400 mg total) by mouth 2 (two) times daily. TAKE 1 CAPSULE(300 MG) BY MOUTH THREE TIMES DAILY (Patient taking differently: Take 400 mg by mouth 2 (two) times daily. ) 60 capsule 3  . glucose blood (ONETOUCH VERIO) test strip USE TO TEST BLOOD SUGAR 2 TO 3 TIMES A DAY 100 strip 2  . hydrALAZINE (APRESOLINE) 25 MG tablet Take 1 tablet (25 mg total) by mouth every 8 (eight) hours. 90 tablet 6  .  Incontinence Supply Disposable (DEPEND ADJUSTABLE UNDERWEAR LG) MISC 1 application by Does not apply route as needed. 32 each 3  . insulin glargine (LANTUS SOLOSTAR) 100 UNIT/ML Solostar Pen Inject 40 Units into the skin daily. ADMINISTER 30 UNITS UNDER THE SKIN TWICE DAILY (Patient taking differently: Inject 40 Units into the skin daily. ) 15 mL 11  . isosorbide mononitrate (IMDUR) 30 MG 24 hr tablet TAKE 1 TABLET(30 MG) BY MOUTH DAILY (Patient taking differently: Take 30 mg by mouth daily. ) 90 tablet 1  . ivabradine (CORLANOR) 5 MG TABS tablet Take 1 tablet (5 mg total) by mouth 2 (two) times daily with a meal. 60 tablet 6  . Lancet Device MISC To be use with glucometer 1 each 0  . Lancets (ONETOUCH ULTRASOFT) lancets USE AS DIRECTED 100 each 0  . nitroGLYCERIN (NITROSTAT) 0.4 MG SL tablet Place 1 tablet (0.4 mg total) under the tongue every 5 (five) minutes as needed for chest pain. 25 tablet 3  . ondansetron (ZOFRAN ODT) 4 MG disintegrating tablet Take 1 tablet (4 mg total) by mouth every 8 (eight) hours as needed for nausea or vomiting. 20 tablet 0  . pantoprazole (PROTONIX) 40 MG tablet Take 1 tablet (40 mg total) by mouth daily. 30 tablet 0  . spironolactone (ALDACTONE) 25 MG tablet Take 1 tablet (25 mg total) by mouth daily. 30 tablet 6  . sucralfate (CARAFATE) 1 g tablet Take 1 tablet (1 g total) by mouth 4 (four) times daily -  with meals and at bedtime. 28 tablet 0  . [DISCONTINUED] furosemide (LASIX) 40 MG tablet TAKE 1 TABLET BY MOUTH EVERY DAY 90 tablet 0   No current facility-administered medications on file prior to visit.     Allergies  Allergen Reactions  . Tape Itching and Rash    Objective: Custer Pimenta is a pleasant 69 y.o. African American male in NAD. AAO x 3.  There were no vitals filed for this visit.  Vascular Examination: Neurovascular status unchanged b/l lower extremities. Capillary fill time to digits <3 seconds b/l lower extremities. Faintly palpable pedal  pulses b/l. Pedal hair absent. Lower extremity skin temperature gradient within normal limits. No pain with calf compression b/l.  Dermatological Examination: Pedal skin with normal turgor, texture and tone bilaterally. No open wounds bilaterally. No interdigital macerations bilaterally. Toenails 1-5 b/l elongated, discolored, dystrophic, thickened, crumbly with subungual debris and tenderness to dorsal palpation. Improvement in pedal hygiene on today's visit.  Musculoskeletal: Normal muscle strength 5/5 to all lower extremity muscle groups bilaterally. No pain crepitus or joint limitation noted with ROM b/l. Hallux valgus with bunion deformity noted b/l lower extremities.  Neurological Examination: Protective sensation decreased with 10 gram monofilament b/l. Vibratory sensation decreased b/l. Clonus negative b/l.  Last A1c: Hemoglobin A1C Latest Ref Rng &  Units 11/08/2019  HGBA1C 0.0 - 7.0 % 6.3  Some recent data might be hidden    Assessment: 1. Pain due to onychomycosis of toenails of both feet   2. Diabetic peripheral neuropathy associated with type 2 diabetes mellitus ([place])    Plan: -Examined patient. -No new findings. No new orders. -Continue diabetic foot care principles. -Toenails 1-5 b/l were debrided in length and girth with sterile nail nippers and dremel without iatrogenic bleeding.  -Patient to report any pedal injuries to medical professional immediately. -Patient to continue soft, supportive shoe gear daily. -Patient/POA to call should there be question/concern in the interim.  Return in about 3 months (around 08/01/2020).  Marzetta Board, DPM

## 2020-05-13 ENCOUNTER — Encounter: Payer: Self-pay | Admitting: Family Medicine

## 2020-05-13 DIAGNOSIS — H524 Presbyopia: Secondary | ICD-10-CM | POA: Diagnosis not present

## 2020-05-13 DIAGNOSIS — H16223 Keratoconjunctivitis sicca, not specified as Sjogren's, bilateral: Secondary | ICD-10-CM | POA: Diagnosis not present

## 2020-05-13 DIAGNOSIS — H35352 Cystoid macular degeneration, left eye: Secondary | ICD-10-CM | POA: Diagnosis not present

## 2020-05-13 DIAGNOSIS — H25813 Combined forms of age-related cataract, bilateral: Secondary | ICD-10-CM | POA: Diagnosis not present

## 2020-05-13 DIAGNOSIS — H02883 Meibomian gland dysfunction of right eye, unspecified eyelid: Secondary | ICD-10-CM | POA: Diagnosis not present

## 2020-05-13 DIAGNOSIS — E113392 Type 2 diabetes mellitus with moderate nonproliferative diabetic retinopathy without macular edema, left eye: Secondary | ICD-10-CM | POA: Diagnosis not present

## 2020-05-13 LAB — HM DIABETES EYE EXAM

## 2020-05-14 ENCOUNTER — Other Ambulatory Visit: Payer: Self-pay | Admitting: Family Medicine

## 2020-05-14 DIAGNOSIS — I10 Essential (primary) hypertension: Secondary | ICD-10-CM

## 2020-05-17 ENCOUNTER — Other Ambulatory Visit: Payer: Self-pay | Admitting: Family Medicine

## 2020-05-17 ENCOUNTER — Encounter (INDEPENDENT_AMBULATORY_CARE_PROVIDER_SITE_OTHER): Payer: Medicare Other | Admitting: Ophthalmology

## 2020-05-17 DIAGNOSIS — I10 Essential (primary) hypertension: Secondary | ICD-10-CM

## 2020-06-26 ENCOUNTER — Other Ambulatory Visit: Payer: Self-pay | Admitting: Family Medicine

## 2020-07-17 ENCOUNTER — Other Ambulatory Visit: Payer: Self-pay | Admitting: Student in an Organized Health Care Education/Training Program

## 2020-07-25 ENCOUNTER — Other Ambulatory Visit: Payer: Self-pay

## 2020-07-25 MED ORDER — GABAPENTIN 400 MG PO CAPS: 400.0000 mg | ORAL_CAPSULE | Freq: Two times a day (BID) | ORAL | 3 refills | Status: DC

## 2020-07-27 ENCOUNTER — Other Ambulatory Visit: Payer: Self-pay | Admitting: Student in an Organized Health Care Education/Training Program

## 2020-08-06 ENCOUNTER — Encounter: Payer: Self-pay | Admitting: Podiatry

## 2020-08-06 ENCOUNTER — Other Ambulatory Visit: Payer: Self-pay

## 2020-08-06 ENCOUNTER — Ambulatory Visit: Payer: Medicare Other | Admitting: Podiatry

## 2020-08-06 ENCOUNTER — Ambulatory Visit (INDEPENDENT_AMBULATORY_CARE_PROVIDER_SITE_OTHER): Payer: Medicare Other | Admitting: Podiatry

## 2020-08-06 DIAGNOSIS — M79675 Pain in left toe(s): Secondary | ICD-10-CM

## 2020-08-06 DIAGNOSIS — E1142 Type 2 diabetes mellitus with diabetic polyneuropathy: Secondary | ICD-10-CM | POA: Diagnosis not present

## 2020-08-06 DIAGNOSIS — M79674 Pain in right toe(s): Secondary | ICD-10-CM

## 2020-08-06 DIAGNOSIS — L601 Onycholysis: Secondary | ICD-10-CM

## 2020-08-06 DIAGNOSIS — B351 Tinea unguium: Secondary | ICD-10-CM | POA: Diagnosis not present

## 2020-08-06 NOTE — Progress Notes (Signed)
Subjective: Antonio Guerra presents today at risk foot care. Pt has h/o NIDDM with chronic kidney disease and painful thick toenails that are difficult to trim. Pain interferes with ambulation. Aggravating factors include wearing enclosed shoe gear. Pain is relieved with periodic professional debridement.   He states his blood sugar was 108 mg/dl today.  He states he thinks his toenail is loose on the right great toe. Denies any redness, drainage or swelling of digit.  [name], MD is patient's PCP.  Patient states he may finally be getting some help at home via [place].  Past Medical History:  Diagnosis Date  . Arthritis    "all over" (12/20/2015)  . Chronic lower back pain   . Chronic systolic congestive heart failure ([place])    a. 04/2014 Echo: EF 20-25%.  . Coronary artery disease    a. 2011 MI x 2 with PCI: stent x 2 (LAD and RI) @ [facility] in [place], [place];  b. 04/2014 Cath/PCI: LM 10-20, LAD 40p, 65m 817mSR(3.5x38 Xience DES), 50apical, LCX 20 diffuse, RI 50p, 2061mR, RCA 40-70m58md.  . Fibromyalgia   . Hyperlipidemia   . Hypertension   . Hypertensive urgency 09/27/2015  . Myocardial infarction (HCC)[place]15  . [facility] ([facility]   "right hand weaker since" (12/20/2015)  . Type II diabetes mellitus ([facility]   Patient Active Problem List   Diagnosis Date Noted  . Stage 3a chronic kidney disease (HCC)[place]/21/2021  . History of angina 11/08/2019  . Intermittent diarrhea 11/08/2019  . Screening for colon cancer 11/08/2019  . Encounter for immunization 09/23/2018  . Altered mental status   . Type 2 diabetes mellitus with vascular disease (HCC)[place]/07/2015  . Alcohol intoxication (HCC)[place]/07/2015  . Substance-induced anxiety disorder (HCC)[place]/07/2015  . Epigastric pain   . Elevated troponin   . Type 2 diabetes mellitus (HCC)[place]/06/2016  . Acute on chronic combined systolic (congestive) and diastolic (congestive) heart failure (HCC)[place]/04/2016  . Ischemic cardiomyopathy    . Hyperlipidemia   . Coronary artery disease   . Chronic systolic congestive heart failure (HCC)[place]. St[facility] ([facility]  Current Outpatient Medications on File Prior to Visit  Medication Sig Dispense Refill  . isosorbide mononitrate (IMDUR) 30 MG 24 hr tablet TAKE 1 TABLET(30 MG) BY MOUTH DAILY 90 tablet 1  . ammonium lactate (AMLACTIN) 12 % lotion Apply 1 application topically 2 (two) times daily. 400 g 4  . atorvastatin (LIPITOR) 80 MG tablet TAKE 1 TABLET(80 MG) BY MOUTH AT BEDTIME 90 tablet 3  . BD PEN NEEDLE NANO U/F 32G X 4 MM MISC AS DIRECTED TWICE DAILY 100 each 3  . blood glucose meter kit and supplies Dispense based on patient and insurance preference. Use up to four times daily as directed. (FOR ICD-9 250.00, 250.01). 1 each 0  . Blood Glucose Monitoring Suppl (ONETOUCH VERIO) w/Device KIT 30 Units by Does not apply route daily. 1 kit 0  . carvedilol (COREG) 25 MG tablet TAKE 1 TABLET(25 MG) BY MOUTH TWICE DAILY 180 tablet 0  . clopidogrel (PLAVIX) 75 MG tablet TAKE 1 TABLET(75 MG) BY MOUTH DAILY 90 tablet 2  . clotrimazole-betamethasone (LOTRISONE) cream Apply to both feet and between toes bid x 4 weeks. 45 g 1  . Continuous Blood Gluc Receiver (FREESTYLE LIBRE 14 DAY READER) DEVI 1 Device by Does not apply route every morning. 1 each 3  . Dulaglutide (TRULICITY) 0.759.470SJ/6.2EZN ADMINISTER  0.75 MG UNDER THE SKIN 1 TIME A WEEK 2 mL 3  . Fiber Formula CAPS Take 1 tablet by mouth every morning. 30 capsule 11  . furosemide (LASIX) 40 MG tablet TAKE 1 TABLET(40 MG) BY MOUTH DAILY 90 tablet 2  . gabapentin (NEURONTIN) 400 MG capsule Take 1 capsule (400 mg total) by mouth 2 (two) times daily. TAKE 1 CAPSULE(300 MG) BY MOUTH THREE TIMES DAILY 60 capsule 3  . glucose blood (ONETOUCH VERIO) test strip USE TO TEST BLOOD SUGAR 2 TO 3 TIMES A DAY 100 strip 2  . hydrALAZINE (APRESOLINE) 25 MG tablet Take 1 tablet (25 mg total) by mouth every 8 (eight) hours. 90 tablet 6  . Incontinence  Supply Disposable (DEPEND ADJUSTABLE UNDERWEAR LG) MISC 1 application by Does not apply route as needed. 32 each 3  . insulin glargine (LANTUS SOLOSTAR) 100 UNIT/ML Solostar Pen Inject 40 Units into the skin daily. ADMINISTER 30 UNITS UNDER THE SKIN TWICE DAILY (Patient taking differently: Inject 40 Units into the skin daily. ) 15 mL 11  . ivabradine (CORLANOR) 5 MG TABS tablet Take 1 tablet (5 mg total) by mouth 2 (two) times daily with a meal. 60 tablet 6  . Lancet Device MISC To be use with glucometer 1 each 0  . Lancets (ONETOUCH ULTRASOFT) lancets USE AS DIRECTED 100 each 0  . nitroGLYCERIN (NITROSTAT) 0.4 MG SL tablet Place 1 tablet (0.4 mg total) under the tongue every 5 (five) minutes as needed for chest pain. 25 tablet 3  . Olopatadine HCl 0.2 % SOLN Apply 1 drop to eye daily.    . ondansetron (ZOFRAN ODT) 4 MG disintegrating tablet Take 1 tablet (4 mg total) by mouth every 8 (eight) hours as needed for nausea or vomiting. 20 tablet 0  . pantoprazole (PROTONIX) 40 MG tablet Take 1 tablet (40 mg total) by mouth daily. 30 tablet 0  . spironolactone (ALDACTONE) 25 MG tablet Take 1 tablet (25 mg total) by mouth daily. 30 tablet 6  . sucralfate (CARAFATE) 1 g tablet Take 1 tablet (1 g total) by mouth 4 (four) times daily -  with meals and at bedtime. 28 tablet 0   No current facility-administered medications on file prior to visit.     Allergies  Allergen Reactions  . Tape Itching and Rash    Objective: Antonio Guerra is a pleasant 70 y.o. African American male in NAD. AAO x 3.  There were no vitals filed for this visit.  Vascular Examination: Neurovascular status unchanged b/l lower extremities. Capillary fill time to digits <3 seconds b/l lower extremities. Faintly palpable pedal pulses b/l. Pedal hair absent. Lower extremity skin temperature gradient within normal limits. No pain with calf compression b/l.  Dermatological Examination: Pedal skin with normal turgor, texture and tone  bilaterally. No open wounds bilaterally. No interdigital macerations bilaterally. Toenails 1-5 b/l elongated, discolored, dystrophic, thickened, crumbly with subungual debris and tenderness to dorsal palpation. There is noted onchyolysis of entire nailplate of R hallux.  The nailbeds remain intact. There is no erythema, no edema, no drainage, no underlying fluctuance.  Musculoskeletal: Normal muscle strength 5/5 to all lower extremity muscle groups bilaterally. No pain crepitus or joint limitation noted with ROM b/l. Hallux valgus with bunion deformity noted b/l lower extremities.  Neurological Examination: Protective sensation decreased with 10 gram monofilament b/l. Vibratory sensation decreased b/l. Clonus negative b/l.  Last A1c: Hemoglobin A1C Latest Ref Rng & Units 11/08/2019  HGBA1C 0.0 - 7.0 % 6.3  Some  recent data might be hidden    Assessment: 1. Pain due to onychomycosis of toenails of both feet   2. Onycholysis of toenail   3. Diabetic peripheral neuropathy associated with type 2 diabetes mellitus ([place])    Plan: -Examined patient. -No new findings. No new orders. -Continue diabetic foot care principles. -Toenails 1-5 b/l were debrided in length and girth with sterile nail nippers and dremel. Iatrogenic laceration sustained during R 2nd toe. Treated with Lumicain Hemostatic Solution and alcohol. No further treatment required by patient. -Right hallux nailplate gently debrided from it's remaining attachment to digit. Nailbed cleansed with alcohol. Triple antibiotic ointment applied. No further treatment required. -Patient to report any pedal injuries to medical professional immediately. -Report any pedal injuries to medical professional immediately. -Patient/POA to call should there be a concern in the interim.  Return in about 3 months (around 11/04/2020).  Marzetta Board, DPM

## 2020-08-09 ENCOUNTER — Encounter: Payer: Self-pay | Admitting: Family Medicine

## 2020-08-09 ENCOUNTER — Ambulatory Visit (INDEPENDENT_AMBULATORY_CARE_PROVIDER_SITE_OTHER): Payer: Medicare Other | Admitting: Family Medicine

## 2020-08-09 ENCOUNTER — Other Ambulatory Visit: Payer: Self-pay

## 2020-08-09 VITALS — BP 122/60 | HR 84 | Ht 72.0 in | Wt 195.8 lb

## 2020-08-09 DIAGNOSIS — I5022 Chronic systolic (congestive) heart failure: Secondary | ICD-10-CM

## 2020-08-09 DIAGNOSIS — R197 Diarrhea, unspecified: Secondary | ICD-10-CM

## 2020-08-09 DIAGNOSIS — E782 Mixed hyperlipidemia: Secondary | ICD-10-CM | POA: Diagnosis not present

## 2020-08-09 DIAGNOSIS — Z23 Encounter for immunization: Secondary | ICD-10-CM

## 2020-08-09 DIAGNOSIS — E1159 Type 2 diabetes mellitus with other circulatory complications: Secondary | ICD-10-CM

## 2020-08-09 DIAGNOSIS — N1831 Chronic kidney disease, stage 3a: Secondary | ICD-10-CM

## 2020-08-09 LAB — POCT GLYCOSYLATED HEMOGLOBIN (HGB A1C): Hemoglobin A1C: 6.9 % — AB (ref 4.0–5.6)

## 2020-08-09 MED ORDER — DEPEND ADJUSTABLE UNDERWEAR LG MISC: 1.0000 "application " | 3 refills | Status: AC | PRN

## 2020-08-09 MED ORDER — LANCET DEVICE MISC: 0 refills | Status: DC

## 2020-08-09 MED ORDER — ONETOUCH ULTRASOFT LANCETS MISC: 0 refills | Status: AC

## 2020-08-09 NOTE — Progress Notes (Addendum)
SUBJECTIVE:   CHIEF COMPLAINT / HPI:   Accompanied by daughter [name]  T2DM Meds: dulaglutide 0.75 mg weekly, Lantus 40 units daily Checks blood sugar 2-3 times a day CBGs below 200s, fasting sugars typically 100s-110s Denies symptomatic hypoglycemia Daughter states that he has NovoLog as needed for when his blood sugars are over 200, though she states that he has only been needing this 2-3 times a year Followed by podiatry Daughter asking about Dexcom, also needs new lancets and lancet device  HFrEF  Meds: furosemide 40 mg daily, carvedilol 25 mg BID Most recent visit with cardiology Dr. Marlou Porch May 2021 Requesting handicap placard Requires a cane for ambulation and is unable to ambulate 200 feet without becoming short of breath  Diarrhea Patient has had intermittent diarrhea, 2-3 episodes a month Per chart review, this appears to be a chronic issue Intermittent diarrhea with occasional fecal incontinence were noted on most recent PCP visit with Dr. [place] Silos, started on Depends adult diaper and fiber supplement He has been using adult diapers and is asking for more Daughter is wondering if the atorvastatin is causing his diarrhea  Grief Wife passed away recently this past September Daughter is concerned about him as he mostly lays around and does not seem as interested in doing things he enjoys Daughter filled out his PHQ-9 form based on her interactions with him Patient denies SI He does express interest in therapy  PERTINENT  PMH / PSH: CVA, CAD, HFrEF (EF 09/2015 15-20%, G2DD), T2DM, CKD stage IIIa, HLD  OBJECTIVE:   BP 122/60   Pulse 84   Ht 6' (1.829 m)   Wt 195 lb 12.8 oz (88.8 kg)   SpO2 96%   BMI 26.56 kg/m   General: Elderly male, NAD CV: RRR, no murmurs Pulm: CTAB, no wheezes or rales Ext: WWP, no edema  Depression screen [facility] 2/9 08/09/2020  Decreased Interest 3  Down, Depressed, Hopeless 1  PHQ - 2 Score 4  Altered sleeping 3  Tired, decreased  energy 2  Change in appetite 0  Feeling bad or failure about yourself  1  Trouble concentrating 1  Moving slowly or fidgety/restless 0  Suicidal thoughts 1  PHQ-9 Score 12  Difficult doing work/chores Somewhat difficult  Please note: form was completed by the daughter and NOT the patient  ASSESSMENT/PLAN:   Chronic systolic congestive heart failure ([place]) Euvolemic on exam today, well-controlled on current medications. - handicap placard paper work filled out - continue current medications  Hyperlipidemia On high intensity statin.  Goal LDL under 70.  Unable to obtain labs at this visit due to time restraint. - repeat lipid panel, lab visit scheduled for 1/25 at 2:30 PM - will add on 10 mg ezetimibe daily if LDL 70 or higher per pharmacy recommendation  Type 2 diabetes mellitus with vascular disease ([place]) Well-controlled on Lantus 40 units daily and GLP-1 agonist, HbA1c 6.9 today slightly increased from most recent HbA1c 6.3.  No symptomatic hypoglycemia.  Apparently has short acting insulin as needed for CBG >200 but only uses it 2-3 times a year.  Followed by podiatry. - continue current regimen - urine microalbumin at separate lab visit - lancet device and lancets refilled - does not qualify for Dexcom (though he checks his blood sugar 2-3 times/day, would need 3+ injections/day to qualify)  Stage 3a chronic kidney disease Most recent serum creatinine normal at 1.01 in August 2021. - re-check BMP at separate lab visit - consider low-dose ACEi/ARB pending creatinine  Grief Due to recent passing of wife in the past year. Positive PHQ-9 with suicidal thoughts on form - however, form was filled out by daughter. Patient denied any suicidal ideation. Anhedonia noted by daughter. Patient interested in therapy. - therapy resources given   Intermittent diarrhea Chronic, intermittent diarrhea occurring about 2-3 times a month. Has had adult diapers, daughter requesting for more. -  refill of Depends ordered - will need to explore more at f/u visit and consider further workup  HCM - unvaccinated against Covid, 1st dose given today (monitored without any events) - Cologuard kit at home, still to be collected (seen by GI last year, hesitant to do colonoscopy given advanced CHF, planned for Cologuard first before considering colonoscopy) - due for PPSV23, given today - flu shot given today - Tdap ordered for pharmacy  [name], [place]

## 2020-08-09 NOTE — Patient Instructions (Addendum)
It was nice seeing you today!  Your hemoglobin A1c today was 6.9, slightly increased from your last visit which was 6.3.  I would not make any changes to your insulin regimen for now.  I have resent to your pharmacy the lancet device and lancets, as well as the Depends diapers.  I have sent a Tdap vaccine to your pharmacy which you can get at your convenience.  I am glad you have decided to get the flu shot, pneumonia vaccine, and COVID vaccine today.  Please send in a stool sample when you can.  I have scheduled a lab appointment for you on Tuesday, January 25 at 2:30 PM.  I have included below some therapy resources.  I think it would be beneficial for you to talk with a therapist.  Stay well, Antonio Button, MD [place] [phone]     Therapy and Counseling Resources Most providers on this list will take Medicaid. Patients with commercial insurance or Medicare should contact their insurance company to get a list of in network providers.  BestDay:Psychiatry and Counseling [address] [facility] [address] [place] [place], [place] [address] [place]  [address] [place], [place] [address]      [place] [address]  [place], [place] [address] [phone]  [place] [address] [place]  [place], [place] [address]       [phone]      Jinny Blossom [facility] [address] [place], [place]  Family Solutions:  [place]. [place] [phone]  Jour[facility] Counseling:  [place] [address] [place] [phone]  [facility] (under & uninsured) [address] [place] [phone]    [email or web address]    [place] [address] . [place]    [phone]  [facility] [place]      Phone:  [phone]     [place]  [place] [address]      [place], [place] ext [place]: [place], [place], [place]   [place] ([place] therapist) [email or web address]  [place] [address]   [place] [address]    [phone]    [facility]   [address],  [place], [place]   [place]  [place]  [place]  [address] [place], [place]        [phone]  Open Access/Walk In Clinic under & uninsured  [facility]  [address] [place], [place] Crisis [phone]  Family Service of the [place],  ([place])   [place], [place]: [phone]) 8:30 - 12; 1 - 2:30  Family Service of the [place],  [place], [place]    [phone]):8:30 - 12; 2 - 3PM  [facility] [employer],  [address]  [place]; [phone]):   Mon - Fri 8 AM - 5 PM  [facility] [place]  MWF 12:30 to 3:00 or call to schedule an appointment  [phone]  Specific Provider options Psychology Today  [email or web address] 1. click  on find a therapist  2. enter your zip code 3. left side and select or tailor a therapist for your specific need.   [facility] Provider Directory [email or web address]  (Medicaid)   Follow all drop down to find a provider  [place] [phone] or [email or web address] [address], [place], [place] Recovery support and educational   24- Hour Availability:  .  [facility]  . [place], [place] Crisis [phone]  . Family Service of the [employer] [phone]  [facility] Crisis Service  [phone]   . [place]  [phone] (after hours)  . Therapeutic Alternative/Mobile Crisis   [phone]  . [place] National Suicide Hotline  5856176257 ([place])  . Call 911 or go to emergency room  . [employer]  [phone]);  [place] and [employer]   . [place] ACCESS  [phone]); [place], [place], [place], [place], [place], [place], [place]

## 2020-08-10 ENCOUNTER — Other Ambulatory Visit: Payer: Self-pay | Admitting: Family Medicine

## 2020-08-10 DIAGNOSIS — E1159 Type 2 diabetes mellitus with other circulatory complications: Secondary | ICD-10-CM

## 2020-08-10 DIAGNOSIS — N1831 Chronic kidney disease, stage 3a: Secondary | ICD-10-CM

## 2020-08-10 DIAGNOSIS — E782 Mixed hyperlipidemia: Secondary | ICD-10-CM

## 2020-08-10 MED ORDER — TETANUS-DIPHTH-ACELL PERTUSSIS 5-2.5-18.5 LF-MCG/0.5 IM SUSP: 0.5000 mL | Freq: Once | INTRAMUSCULAR | 0 refills | Status: AC

## 2020-08-10 NOTE — Assessment & Plan Note (Signed)
On high intensity statin.  Goal LDL under 70.  Unable to obtain labs at this visit due to time restraint. - repeat lipid panel, lab visit scheduled for 1/25 at 2:30 PM - will add on 10 mg ezetimibe daily if LDL 70 or higher per pharmacy recommendation

## 2020-08-10 NOTE — Assessment & Plan Note (Signed)
Euvolemic on exam today, well-controlled on current medications. - handicap placard paper work filled out - continue current medications

## 2020-08-10 NOTE — Assessment & Plan Note (Signed)
Most recent serum creatinine normal at 1.01 in August 2021. - re-check BMP at separate lab visit - consider low-dose ACEi/ARB pending creatinine

## 2020-08-10 NOTE — Assessment & Plan Note (Addendum)
Well-controlled on Lantus 40 units daily and GLP-1 agonist, HbA1c 6.9 today slightly increased from most recent HbA1c 6.3.  No symptomatic hypoglycemia.  Apparently has short acting insulin as needed for CBG >200 but only uses it 2-3 times a year.  Followed by podiatry. - continue current regimen - urine microalbumin at separate lab visit - lancet device and lancets refilled - does not qualify for Dexcom (though he checks his blood sugar 2-3 times/day, would need 3+ injections/day to qualify)

## 2020-08-12 DIAGNOSIS — Z23 Encounter for immunization: Secondary | ICD-10-CM | POA: Diagnosis not present

## 2020-08-12 NOTE — Addendum Note (Signed)
Addended by: Londell Moh T on: 08/12/2020 08:46 AM   Modules accepted: Orders, SmartSet

## 2020-08-13 ENCOUNTER — Other Ambulatory Visit: Payer: Self-pay

## 2020-08-13 ENCOUNTER — Other Ambulatory Visit: Payer: Medicare Other

## 2020-08-13 DIAGNOSIS — E1159 Type 2 diabetes mellitus with other circulatory complications: Secondary | ICD-10-CM | POA: Diagnosis not present

## 2020-08-13 DIAGNOSIS — N1831 Chronic kidney disease, stage 3a: Secondary | ICD-10-CM

## 2020-08-13 DIAGNOSIS — E782 Mixed hyperlipidemia: Secondary | ICD-10-CM | POA: Diagnosis not present

## 2020-08-14 LAB — MICROALBUMIN / CREATININE URINE RATIO
Creatinine, Urine: 71.3 mg/dL
Microalb/Creat Ratio: 38 mg/g creat — ABNORMAL HIGH (ref 0–29)
Microalbumin, Urine: 27.2 ug/mL

## 2020-08-14 LAB — BASIC METABOLIC PANEL
BUN/Creatinine Ratio: 13 (ref 10–24)
BUN: 19 mg/dL (ref 8–27)
CO2: 20 mmol/L (ref 20–29)
Calcium: 9.3 mg/dL (ref 8.6–10.2)
Chloride: 106 mmol/L (ref 96–106)
Creatinine, Ser: 1.47 mg/dL — ABNORMAL HIGH (ref 0.76–1.27)
GFR calc Af Amer: 56 mL/min/{1.73_m2} — ABNORMAL LOW (ref >59)
GFR calc non Af Amer: 48 mL/min/{1.73_m2} — ABNORMAL LOW (ref >59)
Glucose: 177 mg/dL — ABNORMAL HIGH (ref 65–99)
Potassium: 4.6 mmol/L (ref 3.5–5.2)
Sodium: 143 mmol/L (ref 134–144)

## 2020-08-14 LAB — LIPID PANEL
Chol/HDL Ratio: 4 ratio (ref 0.0–5.0)
Cholesterol, Total: 125 mg/dL (ref 100–199)
HDL: 31 mg/dL — ABNORMAL LOW (ref >39)
LDL Chol Calc (NIH): 52 mg/dL (ref 0–99)
Triglycerides: 268 mg/dL — ABNORMAL HIGH (ref 0–149)
VLDL Cholesterol Cal: 42 mg/dL — ABNORMAL HIGH (ref 5–40)

## 2020-08-21 ENCOUNTER — Other Ambulatory Visit: Payer: Self-pay | Admitting: Family Medicine

## 2020-08-21 DIAGNOSIS — Z794 Long term (current) use of insulin: Secondary | ICD-10-CM

## 2020-08-21 DIAGNOSIS — N1831 Chronic kidney disease, stage 3a: Secondary | ICD-10-CM

## 2020-08-21 MED ORDER — ZOSTER VAC RECOMB ADJUVANTED 50 MCG/0.5ML IM SUSR: 0.5000 mL | Freq: Once | INTRAMUSCULAR | 0 refills | Status: AC

## 2020-08-21 MED ORDER — LISINOPRIL 2.5 MG PO TABS: 2.5000 mg | ORAL_TABLET | Freq: Every day | ORAL | 3 refills | Status: DC

## 2020-08-21 NOTE — Progress Notes (Signed)
Discussed lab results with patient's daughter [name].  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  Will recheck BMP in 1 week.  Lab appointment scheduled for 2/11.  Also scheduled RN visit 2/11 for second dose of Covid vaccine.  Requesting shingles vaccine, ordered for pharmacy.

## 2020-08-26 ENCOUNTER — Other Ambulatory Visit: Payer: Self-pay

## 2020-08-26 ENCOUNTER — Encounter (INDEPENDENT_AMBULATORY_CARE_PROVIDER_SITE_OTHER): Payer: Medicare Other | Admitting: Ophthalmology

## 2020-08-26 DIAGNOSIS — H35033 Hypertensive retinopathy, bilateral: Secondary | ICD-10-CM

## 2020-08-26 DIAGNOSIS — E113513 Type 2 diabetes mellitus with proliferative diabetic retinopathy with macular edema, bilateral: Secondary | ICD-10-CM | POA: Diagnosis not present

## 2020-08-26 DIAGNOSIS — H43813 Vitreous degeneration, bilateral: Secondary | ICD-10-CM | POA: Diagnosis not present

## 2020-08-26 DIAGNOSIS — I10 Essential (primary) hypertension: Secondary | ICD-10-CM | POA: Diagnosis not present

## 2020-08-30 ENCOUNTER — Other Ambulatory Visit: Payer: Medicare Other

## 2020-08-30 ENCOUNTER — Other Ambulatory Visit: Payer: Self-pay

## 2020-08-30 ENCOUNTER — Ambulatory Visit (INDEPENDENT_AMBULATORY_CARE_PROVIDER_SITE_OTHER): Payer: Medicare Other

## 2020-08-30 DIAGNOSIS — Z23 Encounter for immunization: Secondary | ICD-10-CM

## 2020-09-24 ENCOUNTER — Encounter (INDEPENDENT_AMBULATORY_CARE_PROVIDER_SITE_OTHER): Payer: Medicare Other | Admitting: Ophthalmology

## 2020-09-30 ENCOUNTER — Other Ambulatory Visit: Payer: Self-pay

## 2020-09-30 ENCOUNTER — Encounter (INDEPENDENT_AMBULATORY_CARE_PROVIDER_SITE_OTHER): Payer: Medicare Other | Admitting: Ophthalmology

## 2020-09-30 DIAGNOSIS — H35033 Hypertensive retinopathy, bilateral: Secondary | ICD-10-CM

## 2020-09-30 DIAGNOSIS — E113513 Type 2 diabetes mellitus with proliferative diabetic retinopathy with macular edema, bilateral: Secondary | ICD-10-CM | POA: Diagnosis not present

## 2020-09-30 DIAGNOSIS — H43813 Vitreous degeneration, bilateral: Secondary | ICD-10-CM | POA: Diagnosis not present

## 2020-09-30 DIAGNOSIS — I10 Essential (primary) hypertension: Secondary | ICD-10-CM | POA: Diagnosis not present

## 2020-09-30 DIAGNOSIS — H2513 Age-related nuclear cataract, bilateral: Secondary | ICD-10-CM

## 2020-09-30 DIAGNOSIS — H35372 Puckering of macula, left eye: Secondary | ICD-10-CM | POA: Diagnosis not present

## 2020-10-11 ENCOUNTER — Ambulatory Visit: Payer: Medicare Other | Admitting: Podiatry

## 2020-10-22 ENCOUNTER — Other Ambulatory Visit: Payer: Self-pay | Admitting: Family Medicine

## 2020-10-25 ENCOUNTER — Other Ambulatory Visit: Payer: Self-pay | Admitting: Family Medicine

## 2020-10-28 ENCOUNTER — Encounter (INDEPENDENT_AMBULATORY_CARE_PROVIDER_SITE_OTHER): Payer: Medicaid Other | Admitting: Ophthalmology

## 2020-10-28 ENCOUNTER — Other Ambulatory Visit: Payer: Self-pay | Admitting: Family Medicine

## 2020-10-28 DIAGNOSIS — I10 Essential (primary) hypertension: Secondary | ICD-10-CM

## 2020-10-30 ENCOUNTER — Encounter (INDEPENDENT_AMBULATORY_CARE_PROVIDER_SITE_OTHER): Payer: Medicaid Other | Admitting: Ophthalmology

## 2020-11-01 ENCOUNTER — Encounter (INDEPENDENT_AMBULATORY_CARE_PROVIDER_SITE_OTHER): Payer: Medicare Other | Admitting: Ophthalmology

## 2020-11-01 ENCOUNTER — Other Ambulatory Visit: Payer: Self-pay

## 2020-11-01 DIAGNOSIS — E113513 Type 2 diabetes mellitus with proliferative diabetic retinopathy with macular edema, bilateral: Secondary | ICD-10-CM

## 2020-11-01 DIAGNOSIS — H35033 Hypertensive retinopathy, bilateral: Secondary | ICD-10-CM

## 2020-11-01 DIAGNOSIS — H43813 Vitreous degeneration, bilateral: Secondary | ICD-10-CM

## 2020-11-01 DIAGNOSIS — I10 Essential (primary) hypertension: Secondary | ICD-10-CM

## 2020-11-15 ENCOUNTER — Telehealth: Payer: Self-pay

## 2020-11-15 DIAGNOSIS — Z9189 Other specified personal risk factors, not elsewhere classified: Secondary | ICD-10-CM

## 2020-11-15 NOTE — Progress Notes (Signed)
[facility])                                            [place] Team                                        Statin Quality Measure Assessment    11/15/2020  Zia Kanner 15-Aug-1950 [id number]  Per review of chart and payor information, this patient has been flagged for non-adherence to the following CMS Quality Measure:   [x]  Statin Use in Persons with Diabetes  [x]  Statin Use in Persons with Cardiovascular Disease   Currently prescribed statin:  [x]  Yes []  No     Comments: atorvastatin 80 mg daily - last filled, per [place], 04/09/2020. I called and spoke with [name] (POA/daughter) and she stated that patient does not miss doses of atrovastatin. She also stated that she just picked up her father's atorvastatin medication a few months ago for a 90 days supply. I confirmed the current pharmacy on file is [facility] on [address] and she verified that it was. Per daughter, patient is due for a refill in ~ 2 weeks and she assures me that patient is getting atorvastatin daily. Will f/u with Pharmacy in 3-4 weeks to confirm pick up.    Thank you for your time,  Kristeen Miss, [place] Cell: [phone]

## 2020-11-19 ENCOUNTER — Encounter: Payer: Self-pay | Admitting: Podiatry

## 2020-11-19 ENCOUNTER — Ambulatory Visit (INDEPENDENT_AMBULATORY_CARE_PROVIDER_SITE_OTHER): Payer: Medicare Other | Admitting: Podiatry

## 2020-11-19 ENCOUNTER — Other Ambulatory Visit: Payer: Self-pay

## 2020-11-19 DIAGNOSIS — B351 Tinea unguium: Secondary | ICD-10-CM

## 2020-11-19 DIAGNOSIS — M79674 Pain in right toe(s): Secondary | ICD-10-CM | POA: Diagnosis not present

## 2020-11-19 DIAGNOSIS — M79675 Pain in left toe(s): Secondary | ICD-10-CM | POA: Diagnosis not present

## 2020-11-19 DIAGNOSIS — E1142 Type 2 diabetes mellitus with diabetic polyneuropathy: Secondary | ICD-10-CM

## 2020-11-19 DIAGNOSIS — B353 Tinea pedis: Secondary | ICD-10-CM

## 2020-11-19 MED ORDER — CLOTRIMAZOLE 1 % EX CREA: TOPICAL_CREAM | CUTANEOUS | 2 refills | Status: DC

## 2020-11-25 NOTE — Progress Notes (Signed)
Subjective: Antonio Guerra is a pleasant 70 y.o. male patient seen today for at-risk foot care with painful thick toenails that are difficult to trim. Pain interferes with ambulation. Aggravating factors include wearing enclosed shoe gear. Pain is relieved with periodic professional debridement.  He states his provider has not been coming to his home.   Patient states his blood sugar was 113 mg/dl this morning.   PCP is Dr. [name] and last visit was 08/10/2020.  Allergies  Allergen Reactions  . Tape Itching and Rash    Objective: Physical Exam  General: Antonio Guerra is a pleasant 70 y.o. African American male, in NAD. AAO x 3.   Vascular:  Capillary refill time to digits immediate b/l. Pedal pulses 1/4 b/l.  Pedal hair absent b/l lower extremities. Lower extremity skin temperature gradient within normal limits. No pain with calf compression b/l. No edema noted b/l lower extremities.   Dermatological:  Pedal skin with normal turgor, texture and tone bilaterally. No open wounds bilaterally. No interdigital macerations bilaterally. Toenails 1-5 b/l elongated, discolored, dystrophic, thickened, crumbly with subungual debris and tenderness to dorsal palpation. No hyperkeratotic nor porokeratotic lesions present on today's visit.  Diffuse scaling noted dorsal aspect of forefoot extending to digits with mild foot odor.  No interdigital macerations.  No blisters, no weeping. No signs of secondary bacterial infection noted.  Musculoskeletal:  Normal muscle strength 5/5 to all lower extremity muscle groups bilaterally. No pain crepitus or joint limitation noted with ROM b/l. Hallux valgus with bunion deformity noted b/l lower extremities.  Neurological:  Protective sensation intact 5/5 intact bilaterally with 10g monofilament b/l. Vibratory sensation intact b/l. Clonus negative b/l.  Assessment and Plan:  1. Pain due to onychomycosis of toenails of both feet   2. Tinea pedis of both  feet   3. Diabetic peripheral neuropathy associated with type 2 diabetes mellitus ([place])     -Examined patient. -Continue diabetic foot care principles. -Patient to continue soft, supportive shoe gear daily. -Toenails 1-5 b/l were debrided in length and girth with sterile nail nippers and dremel without iatrogenic bleeding.  -Patient to report any pedal injuries to medical professional immediately. --For tinea pedis, Rx sent to pharmacy for Clotrimazole Cream 1% to be applied to feet twice daily for 6 weeks. -Patient/POA to call should there be question/concern in the interim.  Return in about 3 months (around 02/19/2021).  Marzetta Board, DPM

## 2020-12-02 ENCOUNTER — Encounter (INDEPENDENT_AMBULATORY_CARE_PROVIDER_SITE_OTHER): Payer: Medicare Other | Admitting: Ophthalmology

## 2020-12-02 ENCOUNTER — Other Ambulatory Visit: Payer: Self-pay

## 2020-12-02 DIAGNOSIS — H35033 Hypertensive retinopathy, bilateral: Secondary | ICD-10-CM | POA: Diagnosis not present

## 2020-12-02 DIAGNOSIS — I10 Essential (primary) hypertension: Secondary | ICD-10-CM

## 2020-12-02 DIAGNOSIS — E113513 Type 2 diabetes mellitus with proliferative diabetic retinopathy with macular edema, bilateral: Secondary | ICD-10-CM | POA: Diagnosis not present

## 2020-12-02 DIAGNOSIS — H43813 Vitreous degeneration, bilateral: Secondary | ICD-10-CM | POA: Diagnosis not present

## 2020-12-09 ENCOUNTER — Encounter (INDEPENDENT_AMBULATORY_CARE_PROVIDER_SITE_OTHER): Payer: Medicaid Other | Admitting: Ophthalmology

## 2020-12-19 ENCOUNTER — Other Ambulatory Visit: Payer: Self-pay

## 2020-12-19 DIAGNOSIS — Z8679 Personal history of other diseases of the circulatory system: Secondary | ICD-10-CM

## 2020-12-19 DIAGNOSIS — E1159 Type 2 diabetes mellitus with other circulatory complications: Secondary | ICD-10-CM

## 2020-12-19 MED ORDER — NITROGLYCERIN 0.4 MG SL SUBL: 0.4000 mg | SUBLINGUAL_TABLET | SUBLINGUAL | 3 refills | Status: DC | PRN

## 2020-12-19 MED ORDER — LANTUS SOLOSTAR 100 UNIT/ML ~~LOC~~ SOPN: 40.0000 [IU] | PEN_INJECTOR | Freq: Every day | SUBCUTANEOUS | 11 refills | Status: DC

## 2020-12-19 MED ORDER — TRULICITY 0.75 MG/0.5ML ~~LOC~~ SOAJ: SUBCUTANEOUS | 3 refills | Status: DC

## 2020-12-19 NOTE — Telephone Encounter (Signed)
Patient's daughter calls nurse line requesting medication refills and a new referral for home health nursing.   Please advise.   Talbot Grumbling, RN

## 2020-12-19 NOTE — Telephone Encounter (Signed)
I have refilled the medications. Unless anyone knows otherwise, I believe he will require an office visit within 90 days to fulfill the face to face requirement before a new home health referral can be placed. Can someone please call patient to schedule an appointment?

## 2020-12-24 NOTE — Telephone Encounter (Signed)
Returned call to daughter. No answer. Left HIPAA compliant VM for her to return call to office.   Talbot Grumbling, RN

## 2020-12-26 ENCOUNTER — Ambulatory Visit: Payer: Medicare Other | Admitting: Family Medicine

## 2020-12-30 ENCOUNTER — Encounter (INDEPENDENT_AMBULATORY_CARE_PROVIDER_SITE_OTHER): Payer: Medicaid Other | Admitting: Ophthalmology

## 2020-12-31 ENCOUNTER — Other Ambulatory Visit: Payer: Self-pay

## 2020-12-31 ENCOUNTER — Ambulatory Visit (INDEPENDENT_AMBULATORY_CARE_PROVIDER_SITE_OTHER): Payer: Medicare Other | Admitting: Student in an Organized Health Care Education/Training Program

## 2020-12-31 VITALS — BP 118/80 | HR 100 | Ht 73.0 in | Wt 200.0 lb

## 2020-12-31 DIAGNOSIS — Z8673 Personal history of transient ischemic attack (TIA), and cerebral infarction without residual deficits: Secondary | ICD-10-CM

## 2020-12-31 DIAGNOSIS — Z794 Long term (current) use of insulin: Secondary | ICD-10-CM

## 2020-12-31 DIAGNOSIS — E1159 Type 2 diabetes mellitus with other circulatory complications: Secondary | ICD-10-CM

## 2020-12-31 DIAGNOSIS — E1122 Type 2 diabetes mellitus with diabetic chronic kidney disease: Secondary | ICD-10-CM | POA: Diagnosis not present

## 2020-12-31 DIAGNOSIS — N1831 Chronic kidney disease, stage 3a: Secondary | ICD-10-CM | POA: Diagnosis not present

## 2020-12-31 LAB — POCT GLYCOSYLATED HEMOGLOBIN (HGB A1C): HbA1c, POC (controlled diabetic range): 6.2 % (ref 0.0–7.0)

## 2020-12-31 MED ORDER — TRULICITY 1.5 MG/0.5ML ~~LOC~~ SOAJ
1.5000 mg | SUBCUTANEOUS | 1 refills | Status: DC
Start: 2020-12-31 — End: 2021-02-07

## 2020-12-31 NOTE — Progress Notes (Addendum)
   SUBJECTIVE:   CHIEF COMPLAINT / HPI: home health services  S/p CVA and symptomatic HF- requires home health services for meals, medication administration and hygiene. 1.5 months without a nurse due to their long-term care provider leaving the company and then the company discontinued their service.  [facility].  [facility] is who they used previously.  Requesting help with breakfast and lunch care. Bathing assistance, household chores. Assistance with medications (blood sugar monitoring and insulin shots) 9-12:30 on weekdays. Family members are able to help on evenings and weekends.  DM- 40units of insulin daily. Takes trulicity on Fridays. Has had several episodes of hypoglycemia in the past and recently when he does not eat enough. His daughter tries to keep juice at his house to treat these episodes. He typically checks his blood sugars in the mornings only.  OBJECTIVE:   BP 118/80   Pulse 100   Ht 6\' 1"  (1.854 m)   Wt 200 lb (90.7 kg)   SpO2 100%   BMI 26.39 kg/m   Physical Exam Vitals and nursing note reviewed. Exam conducted with a chaperone present.  Constitutional:      General: He is not in acute distress.    Appearance: Normal appearance. He is not ill-appearing or toxic-appearing.  HENT:     Head: Normocephalic.  Cardiovascular:     Rate and Rhythm: Normal rate and regular rhythm.  Pulmonary:     Effort: Pulmonary effort is normal.     Breath sounds: Normal breath sounds.  Musculoskeletal:        General: No swelling.     Right lower leg: No edema.     Left lower leg: No edema.  Skin:    General: Skin is warm and dry.  Neurological:     Mental Status: He is alert and oriented to person, place, and time.  Psychiatric:        Mood and Affect: Mood normal.        Behavior: Behavior normal.   ASSESSMENT/PLAN:   Stroke [facility].  Call [name] (daughter) [phone] to set  up.  Type 2 diabetes mellitus with vascular disease ([place]) Frequent episodes of hypoglycemia due to poor PO intake at times.  Adherent with medications.  Today, decreasing daily lantus from 40units to 20 units daily. His A1c has been well in goal for over 2 years and is 6.2 today. We will increase his trulicity to 1.5 today which he will start on Friday.  Goal would be to discontinue insulin all together. Recheck A1c in 3 months.  [place]

## 2020-12-31 NOTE — Patient Instructions (Signed)
It was a pleasure to see you today!  To summarize our discussion for this visit: For your diabetes- please decrease your insulin to 20units daily. Continue to monitor your blood sugars and call back if any concerns with high or low blood sugar levels. We are increasing your trulicity to 1.5mg  weekly.  Please come back to check your A1c again in 3 months and if it still looks good, we can try to decrease your insulin even more.  Please look out for a phone call about setting up home health services.   Some additional health maintenance measures we should update are: Health Maintenance Due  Topic Date Due   TETANUS/TDAP  Never done   Zoster Vaccines- Shingrix (1 of 2) Never done   COLONOSCOPY (Pts 45-9yrs Insurance coverage will need to be confirmed)  Never done   COVID-19 Vaccine (3 - Pfizer risk series) 09/27/2020     Please return to our clinic to see me as needed.  Call the clinic at [phone] if your symptoms worsen or you have any concerns.   Thank you for allowing me to take part in your care,  Dr. Doristine Mango

## 2021-01-02 NOTE — Assessment & Plan Note (Addendum)
Frequent episodes of hypoglycemia due to poor PO intake at times.  Adherent with medications.  Today, decreasing daily lantus from 40units to 20 units daily. His A1c has been well in goal for over 2 years and is 6.2 today. We will increase his trulicity to 1.5 today which he will start on Friday.  Goal would be to discontinue insulin all together. Recheck A1c in 3 months.

## 2021-01-02 NOTE — Assessment & Plan Note (Signed)
[facility].  Call [name] (daughter) [phone] to set up.

## 2021-01-08 ENCOUNTER — Other Ambulatory Visit: Payer: Self-pay

## 2021-01-08 ENCOUNTER — Encounter (INDEPENDENT_AMBULATORY_CARE_PROVIDER_SITE_OTHER): Payer: Medicare Other | Admitting: Ophthalmology

## 2021-01-08 DIAGNOSIS — E113512 Type 2 diabetes mellitus with proliferative diabetic retinopathy with macular edema, left eye: Secondary | ICD-10-CM

## 2021-01-08 DIAGNOSIS — E113591 Type 2 diabetes mellitus with proliferative diabetic retinopathy without macular edema, right eye: Secondary | ICD-10-CM

## 2021-01-08 DIAGNOSIS — I10 Essential (primary) hypertension: Secondary | ICD-10-CM | POA: Diagnosis not present

## 2021-01-08 DIAGNOSIS — H43813 Vitreous degeneration, bilateral: Secondary | ICD-10-CM | POA: Diagnosis not present

## 2021-01-08 DIAGNOSIS — H35033 Hypertensive retinopathy, bilateral: Secondary | ICD-10-CM

## 2021-01-22 ENCOUNTER — Telehealth: Payer: Self-pay

## 2021-01-22 NOTE — Telephone Encounter (Signed)
Patients daughter calls nurse line checking the status of [place] referral. I spoke with Jazmin and she is actively trying to find a place to start services. I have updated daughter. She is appreciative of our help.

## 2021-01-27 ENCOUNTER — Other Ambulatory Visit: Payer: Self-pay | Admitting: Family Medicine

## 2021-01-27 DIAGNOSIS — I10 Essential (primary) hypertension: Secondary | ICD-10-CM

## 2021-01-29 ENCOUNTER — Other Ambulatory Visit: Payer: Self-pay | Admitting: Family Medicine

## 2021-01-29 DIAGNOSIS — I5022 Chronic systolic (congestive) heart failure: Secondary | ICD-10-CM

## 2021-01-31 ENCOUNTER — Other Ambulatory Visit: Payer: Self-pay | Admitting: *Deleted

## 2021-01-31 DIAGNOSIS — Z8673 Personal history of transient ischemic attack (TIA), and cerebral infarction without residual deficits: Secondary | ICD-10-CM

## 2021-01-31 NOTE — Progress Notes (Signed)
Patient initially had home health referral for nursing and PCS.  Ordered CCM to assist with getting PCS forms completed for services.  Wife would prefer to have them done with [facility].  Sahib Pella,CMA

## 2021-02-05 ENCOUNTER — Encounter (INDEPENDENT_AMBULATORY_CARE_PROVIDER_SITE_OTHER): Payer: Medicaid Other | Admitting: Ophthalmology

## 2021-02-07 ENCOUNTER — Other Ambulatory Visit: Payer: Self-pay | Admitting: Student in an Organized Health Care Education/Training Program

## 2021-02-10 ENCOUNTER — Telehealth: Payer: Self-pay | Admitting: *Deleted

## 2021-02-10 NOTE — Chronic Care Management (AMB) (Signed)
  Care Management   Outreach Note  02/10/2021 Name: Antonio Guerra MRN: [id number] DOB: 01/05/1951  Referred by: [name], MD Reason for referral : Care Coordination (Initial outreach to schedule referral with Licensed Clinical SW )   An unsuccessful telephone outreach was attempted today. The patient was referred to the case management team for assistance with care management and care coordination.   Follow Up Plan: A HIPAA compliant phone message was left for the patient providing contact information and requesting a return call. The care management team will reach out to the patient again over the next 7 days.  If patient returns call to provider office, please advise to call [place] at [phone].  [place] Management  Direct Dial: [phone]

## 2021-02-11 NOTE — Chronic Care Management (AMB) (Signed)
  Care Management   Outreach Note  02/11/2021 Name: Ryott Cerveny MRN: [id number] DOB: Nov 10, 1950  Referred by: [name], MD Reason for referral : Care Coordination (Initial outreach to schedule referral with Licensed Clinical SW )   A  telephone outreach was attempted today. The patient was referred to the case management team for assistance with care management and care coordination.   Follow Up Plan: If patient returns call to provider office, please advise to call [place]* at [place] Management  Direct Dial: [phone]

## 2021-02-17 NOTE — Chronic Care Management (AMB) (Signed)
  Care Management   Note  02/17/2021 Name: Antonio Guerra MRN: [id number] DOB: June 24, 1951  [name] is a 70 y.o. year old male who is a primary care patient of [name], MD. I reached out to [name] by phone today in response to a referral sent by Mr. Kapena Walla PCP, Dr. Nancy Fetter.    Mr. Khera was given information about care management services today including:  Care management services include personalized support from designated clinical staff supervised by his physician, including individualized plan of care and coordination with other care providers 24/7 contact phone numbers for assistance for urgent and routine care needs. The patient may stop care management services at any time by phone call to the office staff.  Daughter [name] verbally agreed to assistance and services provided by embedded care coordination/care management team today.  Follow up plan: Telephone appointment with care management team member scheduled for:02/20/21  Annalina Needles  Care Guide, Embedded Care Coordination [place]  Care Management  Direct Dial: [phone]

## 2021-02-19 ENCOUNTER — Ambulatory Visit (INDEPENDENT_AMBULATORY_CARE_PROVIDER_SITE_OTHER): Payer: Medicare Other | Admitting: Podiatry

## 2021-02-19 ENCOUNTER — Other Ambulatory Visit: Payer: Self-pay

## 2021-02-19 DIAGNOSIS — M79675 Pain in left toe(s): Secondary | ICD-10-CM

## 2021-02-19 DIAGNOSIS — M79674 Pain in right toe(s): Secondary | ICD-10-CM | POA: Diagnosis not present

## 2021-02-19 DIAGNOSIS — L601 Onycholysis: Secondary | ICD-10-CM | POA: Diagnosis not present

## 2021-02-19 DIAGNOSIS — E1142 Type 2 diabetes mellitus with diabetic polyneuropathy: Secondary | ICD-10-CM

## 2021-02-19 DIAGNOSIS — B351 Tinea unguium: Secondary | ICD-10-CM | POA: Diagnosis not present

## 2021-02-19 DIAGNOSIS — L853 Xerosis cutis: Secondary | ICD-10-CM | POA: Diagnosis not present

## 2021-02-19 NOTE — Patient Instructions (Signed)
Remove band-aid on right great toe tomorrow.  Let right great toe get air.  Apply Neosporin Cream to right great toe once daily for one week.   For extremely dry, cracked feet: moisturize feet once daily; do not apply between toes A. CeraVe Healing Ointment B. Aquaphor Healing Ointment C. Vaseline Petroleum Healing Jelly   If you have problems reaching your feet: apply to feet once daily; do not apply between toes A.  Aquaphor Advanced Therapy Ointment Body Spray B.  Vaseline Intensive Care Spray Lotion Advanced Repair

## 2021-02-20 ENCOUNTER — Ambulatory Visit: Payer: Medicare Other | Admitting: Licensed Clinical Social Worker

## 2021-02-20 ENCOUNTER — Encounter: Payer: Self-pay | Admitting: Licensed Clinical Social Worker

## 2021-02-20 ENCOUNTER — Other Ambulatory Visit: Payer: Self-pay | Admitting: Family Medicine

## 2021-02-20 DIAGNOSIS — Z8673 Personal history of transient ischemic attack (TIA), and cerebral infarction without residual deficits: Secondary | ICD-10-CM

## 2021-02-20 DIAGNOSIS — Z741 Need for assistance with personal care: Secondary | ICD-10-CM

## 2021-02-20 DIAGNOSIS — Z719 Counseling, unspecified: Secondary | ICD-10-CM

## 2021-02-20 DIAGNOSIS — Z7189 Other specified counseling: Secondary | ICD-10-CM

## 2021-02-20 DIAGNOSIS — I5022 Chronic systolic (congestive) heart failure: Secondary | ICD-10-CM

## 2021-02-20 NOTE — Chronic Care Management (AMB) (Signed)
Care Management Clinical Social Work Note  02/20/2021 Name: Antonio Guerra MRN: [id number] DOB: 05/03/1951  Antonio Guerra is a 69 y.o. year old male who is a primary care patient of Guerra, Richard, MD.  The Care Management team was consulted for assistance with coordination needs for level of care concerns.  Spoke with patient's daughter  for initial visit in response to provider referral for social work care coordination services.  Consent to Services:  [name] was given information about Care Management services today including:  Care Management services includes personalized support from designated clinical staff supervised by his physician, including individualized plan of care and coordination with other care providers 24/7 contact phone numbers for assistance for urgent and routine care needs. The patient may stop case management services at any time by phone call to the office staff.  Patient's daughter agreed to services and consent obtained.   Assessment: Patient's daughter Antonio Guerra provided all information during this encounter. Patient is currently experiencing difficulty with with meeting ADL's.  Daughter has noticed a steady decline in health, difficulty with giving self injections, memory decline,  unsteady mobility as well as increase in urinary incontinence.   See Care Plan below for interventions and patient self-care actives.  Recent life changes /stressors: recent death of wife and daughter  Recommendation: Patient may benefit from, and daughter is in agreement for Home Health (OT, PT and RN); PCS for and aid and . urinary incontinence supplies via [employer]. LCSW will coordinate with PCP.  Follow up Plan: Patient's daughter would like continued follow-up from CCM LCSW .  No follow up scheduled will continue to collaborate with care plan and reach out to patient and daughter in 1 to 2 weeks. Patient will call office if needed prior to next encounter.   Review of patient past  medical history, allergies, medications, and health status, including review of relevant consultants reports was performed today as part of a comprehensive evaluation and provision of chronic care management and care coordination services.  SDOH (Social Determinants of Health) assessments and interventions performed:  SDOH Interventions    Flowsheet Row Most Recent Value  SDOH Interventions   Food Insecurity Interventions Intervention Not Indicated  Housing Interventions Intervention Not Indicated  Transportation Interventions Intervention Not Indicated        Advanced Directives Status: See Care Plan for related entries.  Care Plan  Allergies  Allergen Reactions   Tape Itching and Rash    Outpatient Encounter Medications as of 02/20/2021  Medication Sig   aspirin EC 81 MG tablet Take 81 mg by mouth daily. Swallow whole.   atorvastatin (LIPITOR) 80 MG tablet TAKE 1 TABLET(80 MG) BY MOUTH AT BEDTIME   BD PEN NEEDLE NANO U/F 32G X 4 MM MISC AS DIRECTED TWICE DAILY   blood glucose meter kit and supplies Dispense based on patient and insurance preference. Use up to four times daily as directed. (FOR ICD-9 250.00, 250.01).   Blood Glucose Monitoring Suppl (ONETOUCH VERIO) w/Device KIT 30 Units by Does not apply route daily.   carvedilol (COREG) 25 MG tablet TAKE 1 TABLET(25 MG) BY MOUTH TWICE DAILY   clopidogrel (PLAVIX) 75 MG tablet TAKE 1 TABLET(75 MG) BY MOUTH DAILY   clotrimazole (LOTRIMIN) 1 % cream Apply to feet bid x 6 weeks.   clotrimazole-betamethasone (LOTRISONE) cream Apply to both feet and between toes bid x 4 weeks.   Continuous Blood Gluc Receiver (FREESTYLE LIBRE 14 DAY READER) DEVI 1 Device by Does not apply route every morning. (  Patient not taking: Reported on 08/09/2020)   furosemide (LASIX) 40 MG tablet TAKE 1 TABLET(40 MG) BY MOUTH DAILY   gabapentin (NEURONTIN) 400 MG capsule TAKE 1 CAPSULE BY MOUTH TWICE DAILY   glucose blood (ONETOUCH VERIO) test strip USE TO TEST  BLOOD SUGAR 2 TO 3 TIMES A DAY   Incontinence Supply Disposable (DEPEND ADJUSTABLE UNDERWEAR LG) MISC 1 application by Does not apply route as needed.   insulin glargine (LANTUS SOLOSTAR) 100 UNIT/ML Solostar Pen Inject 40 Units into the skin daily.   isosorbide mononitrate (IMDUR) 30 MG 24 hr tablet TAKE 1 TABLET(30 MG) BY MOUTH DAILY   Lancet Device MISC To be use with glucometer   Lancets (ONETOUCH ULTRASOFT) lancets USE AS DIRECTED   lisinopril (ZESTRIL) 2.5 MG tablet Take 1 tablet (2.5 mg total) by mouth at bedtime.   nitroGLYCERIN (NITROSTAT) 0.4 MG SL tablet Place 1 tablet (0.4 mg total) under the tongue every 5 (five) minutes as needed for chest pain.   Olopatadine HCl 0.2 % SOLN Apply 1 drop to eye daily.   TRULICITY 1.5 EN/2.7PO SOPN ADMINISTER 1.5 MG UNDER THE SKIN 1 TIME A WEEK   No facility-administered encounter medications on file as of 02/20/2021.    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[place]) 12/19/2015   Epigastric pain    Elevated troponin    Type 2 diabetes mellitus ([place]) 09/29/2015   Acute on chronic combined systolic (congestive) and diastolic (congestive) heart failure ([place]) 09/27/2015   Ischemic cardiomyopathy    Hyperlipidemia    Coronary artery disease    Chronic systolic congestive heart failure ([place])    Stroke ([place])     Conditions to be addressed/monitored:  Level of care concerns  Care Plan : General Social Work (Adult)  Updates made by Maurine Cane, LCSW since 02/20/2021 12:00 AM     Problem: Mobility and Independence      Goal: Mobility and Independence Optimized   Start Date: 02/20/2021  This Visit's Progress: On track  Note:   Current Barriers:   Patient unable to  consistently perform activities of daily living and needs assistance and support in order to meet this unmet need Clinical Goals: Over the next 45 to 60 days, patient will have personal care needs met as evident by having PCS Aide in the home assisting with needs. Will also have other needs met with Home Health and incontinence supplies.  Clinical Interventions : Assessed needs, level of care concerns, basic eligibility and provided education on Personal Care Service process,  E-mail  list of PCS agencies and what to expect with PCS process LCSW collaborate with [facility] to verify patient case is still open Review various resources, discussed options and provided patient information about Enhanced Benefits connected with insurance provider  [facility]); Home Health ( decline in mobility); need for incontinence supplies  Patient's daughter expressed concern with urinary incontinence and requesting assistance with the following  chucks gloves wipes pull ups Collaboration with/confirmation of receipt of DME orders at [employer], [place] , and Caregiver stress acknowledged  Collaboration with PCP regarding development and update of comprehensive plan of care as evidenced by provider attestation and co-signature Inter-disciplinary care team collaboration (  see longitudinal plan of care) Patient Goals/Self-Care Activities: Over the next 30 days Review personal care service information and provider list Select 2 to 3 agencies you would like to use, call [facility] to provide new agency Return calls from [facility] or call them directly if you have questions [phone] or [phone]  I will inform your provider about HH request and incontinence supplies      Problem: Long-Term Care Planning      Goal: Advance Directive   Start Date: 02/20/2021  This Visit's Progress: On track  Note:   Current barriers:   Patient  does not have an advance directive.  Needs education, support and coordination in order to meet this need. Clinical Goal(s): Over the next 60 days, the patient and daughter will review information on advance directive, complete advance directive packet and have notarized.  Interventions: Collaboration with PCP regarding development and update of comprehensive plan of care as evidenced by provider attestation and co-signature. Inter-disciplinary care team collaboration (see longitudinal plan of care) A voluntary discussion about advanced care planning including importance of advanced directives, healthcare proxy and living will was discussed with the patient's daughter. She has discussed with patient.   Educational information on Advance Directives as well as an advance directive packet provided.  Patient Goals/Self-Care Activities : Over the next 60 days Review educational information on Advance Directive  Complete Advance Directive packet,  Have advance directive notarized and provide a copy to provider office Call LCSW if you have questions       Deborah Moore, LCSW Care Management & Coordination  [facility]   [phone] 3:28 PM  

## 2021-02-20 NOTE — Patient Instructions (Signed)
Visit Information   Goals Addressed             This Visit's Progress    Effective Long-Term Care Planning       Patient Goals/Self-Care Activities : Over the next 60 days Review educational information on Advance Directive  Complete Advance Directive packet,  Have advance directive notarized and provide a copy to provider office Call LCSW if you have questions      Maintain Mobility and Function       Patient Goals/Self-Care Activities: Over the next 30 days Review personal care service information and provider list Select 2 to 3 agencies you would like to use, call [facility] to provide new agency Return calls from [facility] or call them directly if you have questions [phone] or [phone]  I will inform your provider about [facility] request and incontinence supplies       Patient verbalizes understanding of instructions provided today and agrees to view in [place].   The care management team will reach out to the patient again over the next 14 to 21 days.   Casimer Lanius, LCSW Care Management & Coordination  [phone]

## 2021-02-23 ENCOUNTER — Encounter: Payer: Self-pay | Admitting: Podiatry

## 2021-02-23 NOTE — Progress Notes (Signed)
  Subjective:  Patient ID: Antonio Guerra, male    DOB: 1950/10/18,  MRN: [id number]  70 y.o. male presents with at risk foot care with history of diabetic neuropathy and painful thick toenails that are difficult to trim. Pain interferes with ambulation. Aggravating factors include wearing enclosed shoe gear. Pain is relieved with periodic professional debridement.  He states he was unable to get anyone to apply the antifungal cream to his feet  prescribed on his last visit. He has been going between his daughter's and son's homes on occasion.  Patient's blood sugar was 168 mg/dl on yesterday.  PCP: [name], MD and last visit was: 08/09/2020  Review of Systems: Negative except as noted in the HPI.   Allergies  Allergen Reactions   Tape Itching and Rash    Objective:  There were no vitals filed for this visit. Constitutional Patient is a pleasant 70 y.o. African American male in NAD. AAO x 3.  Vascular Capillary refill time to digits immediate b/l. Faintly palpable DP pulse(s) b/l lower extremities. Faintly palpable PT pulse(s) b/l lower extremities. Pedal hair absent. Lower extremity skin temperature gradient within normal limits. No pain with calf compression b/l. No edema noted b/l lower extremities. No cyanosis or clubbing noted.  Neurologic Normal speech. Protective sensation intact 5/5 intact bilaterally with 10g monofilament b/l.  Dermatologic Toenails 2-5 bilaterally and L hallux elongated, discolored, dystrophic, thickened, and crumbly with subungual debris and tenderness to dorsal palpation. There is noted onchyolysis of entire nailplate of R hallux.  The nailbeds remain intact. There is no erythema, no edema, no drainage, no underlying fluctuance. Pedal skin noted to be dry and flaky b/l feet.  Orthopedic: Normal muscle strength 5/5 to all lower extremity muscle groups bilaterally. Hallux valgus with bunion deformity noted b/l lower extremities.   Hemoglobin A1C Latest Ref Rng  & Units 12/31/2020 08/09/2020  HGBA1C 0.0 - 7.0 % 6.2 6.9(A)  Some recent data might be hidden   Assessment:   1. Pain due to onychomycosis of toenails of both feet   2. Onycholysis of toenail   3. Xerosis cutis   4. Diabetic peripheral neuropathy associated with type 2 diabetes mellitus ([place])    Plan:  Patient was evaluated and treated and all questions answered.  Onychomycosis with pain -Nails palliatively debridement as below. -Educated on self-care  Procedure: Nail Debridement Rationale: Pain Type of Debridement: manual, sharp debridement. Instrumentation: Nail nipper, rotary burr. Number of Nails: 9  -Examined patient. -Patient to continue soft, supportive shoe gear daily. -Toenails 2-5 bilaterally and L hallux debrided in length and girth without iatrogenic bleeding with sterile nail nipper and dremel.  -Right great toe nailplate gently debrided from it's remaining attachment to digit. Nailbed cleansed with alcohol. Betadine ointment painted onto nailbed. Band-aid applied. He was instructed to remove band-aid on tomorrow and apply Neosporin Cream to right great toe once daily for one week. He was given printed instructions today. He related understanding. -Patient was given a written list of moisturizers. He was instructed to moisturize feet daily to manage dry skin.  -Patient to report any pedal injuries to medical professional immediately. -Patient/POA to call should there be question/concern in the interim.  Return in about 3 months (around 05/22/2021).  Marzetta Board, DPM

## 2021-02-24 ENCOUNTER — Telehealth: Payer: Self-pay

## 2021-02-24 MED ORDER — BD PEN NEEDLE NANO U/F 32G X 4 MM MISC: 3 refills | Status: AC

## 2021-02-24 NOTE — Progress Notes (Signed)
Order for [facility] referral placed for RN, OT, PT per request.

## 2021-02-24 NOTE — Addendum Note (Signed)
Addended by: [name] D on: 02/24/2021 06:41 PM   Modules accepted: Orders

## 2021-02-24 NOTE — Telephone Encounter (Signed)
Called to schedule AWV.   Pt dtr [name]) requested refill for BD Nano Pen Needle 32G x 69m.   [facility]

## 2021-02-27 ENCOUNTER — Ambulatory Visit: Payer: Medicare Other | Admitting: Licensed Clinical Social Worker

## 2021-02-27 DIAGNOSIS — Z741 Need for assistance with personal care: Secondary | ICD-10-CM

## 2021-02-27 DIAGNOSIS — Z7189 Other specified counseling: Secondary | ICD-10-CM

## 2021-02-27 DIAGNOSIS — Z719 Counseling, unspecified: Secondary | ICD-10-CM

## 2021-02-27 NOTE — Patient Instructions (Signed)
Visit Information   Goals Addressed             This Visit's Progress    Maintain Mobility and Function       Patient Goals/Self-Care Activities: Over the next 30 days Review personal care service information and provider list Select 2 to 3 agencies you would like to use, call [facility] to provide new agency Return calls from [facility] or call them directly if you have questions [phone] or [phone]  [facility] order has been placed      Patient's daughter verbalizes understanding of instructions provided today.   Will follow up with you 2 weeks per your request.  Please call the office prior to our next encounter if needs arise   Casimer Lanius, [place] [facility]  [phone]

## 2021-02-27 NOTE — Chronic Care Management (AMB) (Signed)
Care Management Clinical Social Work Note  02/27/2021 Name: Antonio Guerra MRN: [id number] DOB: 08-20-1950  Antonio Guerra is a 70 y.o. year old male who is a primary care patient of Antonio Button, MD.  The Care Management team was consulted for assistance with chronic disease management and coordination needs.  Consent to Services:  The patient was given information about Care Management services, agreed to services, and gave verbal consent prior to initiation of services.  Please see initial visit note for detailed documentation.   Patient agreed to services today and consent obtained.   Assessment: Engaged with patient's daughter Antonio Guerra by phone in response to provider referral for social work care coordination services: Level of Care Concerns and Caregiver Stress.    Daughter continues to experience difficulty with moving forward to select agency for personal care services. [employer] has delivered incontinence supplies and PCP has placed order for [facility] PT. See Care Plan below for interventions and patient self-care actives.  Recommendation: Patient may benefit from, and daughter is in agreement call agencies on the list provided and contact [facility] to restart PCS.   Follow up Plan: Patient's daughter would like continued follow-up from CCM LCSW .  per her request will follow up in 2 weeks. Patient's daughter will call office if needed prior to next encounter.   Review of patient past medical history, allergies, medications, and health status, including review of relevant consultants reports was performed today as part of a comprehensive evaluation and provision of chronic care management and care coordination services.  SDOH (Social Determinants of Health) assessments and interventions performed:    Advanced Directives Status: Not addressed in this encounter.  Care Plan  Allergies  Allergen Reactions   Tape Itching and Rash    Outpatient Encounter Medications as of  02/27/2021  Medication Sig   aspirin EC 81 MG tablet Take 81 mg by mouth daily. Swallow whole.   atorvastatin (LIPITOR) 80 MG tablet TAKE 1 TABLET(80 MG) BY MOUTH AT BEDTIME   blood glucose meter kit and supplies Dispense based on patient and insurance preference. Use up to four times daily as directed. (FOR ICD-9 250.00, 250.01).   Blood Glucose Monitoring Suppl (ONETOUCH VERIO) w/Device KIT 30 Units by Does not apply route daily.   carvedilol (COREG) 25 MG tablet TAKE 1 TABLET(25 MG) BY MOUTH TWICE DAILY   clopidogrel (PLAVIX) 75 MG tablet TAKE 1 TABLET(75 MG) BY MOUTH DAILY   clotrimazole (LOTRIMIN) 1 % cream Apply to feet bid x 6 weeks.   clotrimazole-betamethasone (LOTRISONE) cream Apply to both feet and between toes bid x 4 weeks.   Continuous Blood Gluc Receiver (FREESTYLE LIBRE 14 DAY READER) DEVI 1 Device by Does not apply route every morning. (Patient not taking: Reported on 08/09/2020)   furosemide (LASIX) 40 MG tablet TAKE 1 TABLET(40 MG) BY MOUTH DAILY   gabapentin (NEURONTIN) 400 MG capsule TAKE 1 CAPSULE BY MOUTH TWICE DAILY   glucose blood (ONETOUCH VERIO) test strip USE TO TEST BLOOD SUGAR 2 TO 3 TIMES A DAY   Incontinence Supply Disposable (DEPEND ADJUSTABLE UNDERWEAR LG) MISC 1 application by Does not apply route as needed.   insulin glargine (LANTUS SOLOSTAR) 100 UNIT/ML Solostar Pen Inject 40 Units into the skin daily.   Insulin Pen Needle (BD PEN NEEDLE NANO U/F) 32G X 4 MM MISC AS DIRECTED TWICE DAILY   isosorbide mononitrate (IMDUR) 30 MG 24 hr tablet TAKE 1 TABLET(30 MG) BY MOUTH DAILY   Lancet Device MISC To be use  with glucometer   Lancets (ONETOUCH ULTRASOFT) lancets USE AS DIRECTED   lisinopril (ZESTRIL) 2.5 MG tablet Take 1 tablet (2.5 mg total) by mouth at bedtime.   nitroGLYCERIN (NITROSTAT) 0.4 MG SL tablet Place 1 tablet (0.4 mg total) under the tongue every 5 (five) minutes as needed for chest pain.   Olopatadine HCl 0.2 % SOLN Apply 1 drop to eye daily.    TRULICITY 1.5 LP/3.7TK SOPN ADMINISTER 1.5 MG UNDER THE SKIN 1 TIME A WEEK   No facility-administered encounter medications on file as of 02/27/2021.    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[place]) 12/19/2015   Epigastric pain    Elevated troponin    Type 2 diabetes mellitus ([place]) 09/29/2015   Acute on chronic combined systolic (congestive) and diastolic (congestive) heart failure ([place]) 09/27/2015   Ischemic cardiomyopathy    Hyperlipidemia    Coronary artery disease    Chronic systolic congestive heart failure ([place])    Stroke ([place])     Conditions to be addressed/monitored: ; Level of care concerns and care giver stress  Care Plan : General Social Work (Adult)  Updates made by Antonio Cane, LCSW since 02/27/2021 12:00 AM     Problem: Mobility and Independence      Goal: Mobility and Independence Optimized   Start Date: 02/20/2021  This Visit's Progress: On track  Recent Progress: On track  Note:   Current Barriers:   Patient unable to consistently perform activities of daily living and needs assistance and support in order to meet this unmet need Clinical Goals: Over the next 45 to 60 days, patient will have personal care needs met as evident by having PCS Aide in the home assisting with needs. Will also have other needs met with Home Health and incontinence supplies.  Clinical Interventions : Assessed needs, progress, steps completed towards goal, and barriers to care  E-mail  list of PCS agencies and what to expect with PCS process LCSW collaborate with [facility] to verify patient case is still open Review various resources, discussed options and provided patient information about Enhanced  Benefits connected with insurance provider  [facility]); Home Health ( decline in mobility); need for incontinence supplies  Patient's daughter expressed concern with urinary incontinence and requesting assistance with the following  chucks gloves wipes pull ups DME orders has been processed with [employer] Collaboration with PCP and referral Coordinator for [facility] referral  Solution-Focused Strategies, Problem [place] , and Caregiver stress acknowledged  Inter-disciplinary care team collaboration (see longitudinal plan of care) Patient Goals/Self-Care Activities: Over the next 30 days Review personal care service information and provider list Select 2 to 3 agencies you would like to use, call [employer] to provide new agency Return calls from [facility] or call them directly if you have questions [phone] or [phone]  [facility] order has been placed     Casimer Lanius, [place] / [place]   [phone] 3:14 PM

## 2021-03-01 ENCOUNTER — Ambulatory Visit (INDEPENDENT_AMBULATORY_CARE_PROVIDER_SITE_OTHER): Payer: Medicare Other

## 2021-03-01 NOTE — Progress Notes (Signed)
1st call attempt made at 1:00pm - LVM for patient to return call.  2nd call attempt made at 1:15pm - Unable to leave message - VM box is full.  3rd call attempt made at 1:30 pm - dtr picked up and stated she had forgot about the appt due to an emergency that had come up.   Appt has been rescheduled.

## 2021-03-03 ENCOUNTER — Encounter (INDEPENDENT_AMBULATORY_CARE_PROVIDER_SITE_OTHER): Payer: Medicaid Other | Admitting: Ophthalmology

## 2021-03-03 ENCOUNTER — Other Ambulatory Visit: Payer: Self-pay

## 2021-03-03 DIAGNOSIS — I10 Essential (primary) hypertension: Secondary | ICD-10-CM | POA: Diagnosis not present

## 2021-03-03 DIAGNOSIS — H43813 Vitreous degeneration, bilateral: Secondary | ICD-10-CM

## 2021-03-03 DIAGNOSIS — E113513 Type 2 diabetes mellitus with proliferative diabetic retinopathy with macular edema, bilateral: Secondary | ICD-10-CM | POA: Diagnosis not present

## 2021-03-03 DIAGNOSIS — H35033 Hypertensive retinopathy, bilateral: Secondary | ICD-10-CM

## 2021-03-03 DIAGNOSIS — H2513 Age-related nuclear cataract, bilateral: Secondary | ICD-10-CM

## 2021-03-07 NOTE — Telephone Encounter (Signed)
error 

## 2021-03-11 ENCOUNTER — Ambulatory Visit (INDEPENDENT_AMBULATORY_CARE_PROVIDER_SITE_OTHER): Payer: Medicare Other

## 2021-03-11 DIAGNOSIS — Z0000 Encounter for general adult medical examination without abnormal findings: Secondary | ICD-10-CM | POA: Diagnosis not present

## 2021-03-11 NOTE — Progress Notes (Addendum)
Subjective:   Antonio Guerra is a 70 y.o. male who presents for an Initial Medicare Annual Wellness Visit.  I connected with  Antonio Guerra on 03/11/21 by an audio only telemedicine application and verified that I am speaking with the correct person using two identifiers.   I discussed the limitations, risks, security and privacy concerns of performing an evaluation and management service by telephone and the availability of in person appointments. I also discussed with the patient that there may be a patient responsible charge related to this service. The patient expressed understanding and verbally consented to this telephonic visit.  Location of Patient: Home Location of Provider: Office  List any persons and their role that are participating in the visit with the patient: Patient daughter, Antonio Guerra, was present during the telephone visit.    Review of Systems     Defer to PCP Cardiac Risk Factors include: advanced age (>38mn, >>44women);hypertension;dyslipidemia;diabetes mellitus;male gender;sedentary lifestyle     Objective:    Today's Vitals   03/11/21 1502  PainSc: 4    There is no height or weight on file to calculate BMI.  Advanced Directives 03/11/2021 08/09/2020 03/05/2020 02/27/2020 11/08/2019 11/02/2017 03/19/2017  Does Patient Have a Medical Advance Directive? _0  No No  Would patient like information on creating a medical advance directive? Yes (ED - Information included in AVS) No - Patient declined No - Patient declined No - Patient declined No - Patient declined No - Patient declined No - Patient declined    Current Medications (verified) Outpatient Encounter Medications as of 03/11/2021  Medication Sig   aspirin EC 81 MG tablet Take 81 mg by mouth daily. Swallow whole.   atorvastatin (LIPITOR) 80 MG tablet TAKE 1 TABLET(80 MG) BY MOUTH AT BEDTIME   Blood Glucose Monitoring Suppl (ONETOUCH VERIO) w/Device KIT 30 Units by Does not apply route daily.    carvedilol (COREG) 25 MG tablet TAKE 1 TABLET(25 MG) BY MOUTH TWICE DAILY   clopidogrel (PLAVIX) 75 MG tablet TAKE 1 TABLET(75 MG) BY MOUTH DAILY   clotrimazole-betamethasone (LOTRISONE) cream Apply to both feet and between toes bid x 4 weeks.   furosemide (LASIX) 40 MG tablet TAKE 1 TABLET(40 MG) BY MOUTH DAILY   gabapentin (NEURONTIN) 400 MG capsule TAKE 1 CAPSULE BY MOUTH TWICE DAILY   glucose blood (ONETOUCH VERIO) test strip USE TO TEST BLOOD SUGAR 2 TO 3 TIMES A DAY   Incontinence Supply Disposable (DEPEND ADJUSTABLE UNDERWEAR LG) MISC 1 application by Does not apply route as needed.   insulin glargine (LANTUS SOLOSTAR) 100 UNIT/ML Solostar Pen Inject 40 Units into the skin daily.   Insulin Pen Needle (BD PEN NEEDLE NANO U/F) 32G X 4 MM MISC AS DIRECTED TWICE DAILY   isosorbide mononitrate (IMDUR) 30 MG 24 hr tablet TAKE 1 TABLET(30 MG) BY MOUTH DAILY   Lancets (ONETOUCH ULTRASOFT) lancets USE AS DIRECTED   Olopatadine HCl 0.2 % SOLN Apply 1 drop to eye daily.   TRULICITY 1.5 MEE/1.0OFSOPN ADMINISTER 1.5 MG UNDER THE SKIN 1 TIME A WEEK   clotrimazole (LOTRIMIN) 1 % cream Apply to feet bid x 6 weeks. (Patient not taking: Reported on 03/11/2021)   Continuous Blood Gluc Receiver (FREESTYLE LIBRE 14 DAY READER) DEVI 1 Device by Does not apply route every morning. (Patient not taking: No sig reported)   lisinopril (ZESTRIL) 2.5 MG tablet Take 1 tablet (2.5 mg total) by mouth at bedtime. (Patient not taking: Reported on 03/11/2021)  nitroGLYCERIN (NITROSTAT) 0.4 MG SL tablet Place 1 tablet (0.4 mg total) under the tongue every 5 (five) minutes as needed for chest pain. (Patient not taking: Reported on 03/11/2021)   [DISCONTINUED] blood glucose meter kit and supplies Dispense based on patient and insurance preference. Use up to four times daily as directed. (FOR ICD-9 250.00, 250.01).   [DISCONTINUED] Lancet Device MISC To be use with glucometer   No facility-administered encounter medications on  file as of 03/11/2021.    Allergies (verified) Lisinopril and Tape   History: Past Medical History:  Diagnosis Date   Altered mental status    Arthritis    "all over" (12/20/2015)   Chronic lower back pain    Chronic systolic congestive heart failure ([place])    a. 04/2014 Echo: EF 20-25%.   Coronary artery disease    a. 2011 MI x 2 with PCI: stent x 2 (LAD and RI) @ [facility] in [place], [place];  b. 04/2014 Cath/PCI: LM 10-20, LAD 40p, 26m 877mSR(3.5x38 Xience DES), 50apical, LCX 20 diffuse, RI 50p, 2037mR, RCA 40-77m33md.   Fibromyalgia    Hyperlipidemia    Hypertension    Hypertensive urgency 09/27/2015   [facility]   [facility]   "right hand weaker since" (12/20/2015)   Type II diabetes mellitus (HCC)[facility] Past Surgical History:  Procedure Laterality Date   ANKLE FRACTURE SURGERY Right    CARDIAC CATHETERIZATION N/A 09/30/2015   Procedure: Left Heart Cath and Coronary Angiography;  Surgeon: ChriBurnell Blanks;  [facility];  Service: Cardiovascular;  Laterality: N/A;   CORONARY ANGIOPLASTY WITH STENT PLACEMENT     "total of 3 stents" (12/20/2015)   FRACTIONAL FLOW RESERVE WIRE  05/11/2014   Procedure: FRACTIONAL FLOW RESERVE WIRE;  Surgeon: ChriBurnell Blanks;  [facility];  Service: Cardiovascular;;   FRACTURE SURGERY     LEFT HEART CATHETERIZATION WITH CORONARY/GRAFT ANGIOGRAM N/A 05/11/2014   Procedure: LEFT HEART CATHETERIZATION WITH COROBeatrix Fettersurgeon: ChriBurnell Blanks;  [facility];  Service: Cardiovascular;  Laterality: N/A;   PERCUTANEOUS CORONARY STENT INTERVENTION (PCI-S)  05/11/2014   Procedure: PERCUTANEOUS CORONARY STENT INTERVENTION (PCI-S);  Surgeon: ChriBurnell Blanks;  [facility];  Service: Cardiovascular;;   Family History  Problem Relation Age of Onset   Diabetes Mother    CAD Neg Hx    Stomach cancer Neg Hx    Colon cancer Neg Hx     Esophageal cancer Neg Hx    Pancreatic cancer Neg Hx    Social History   Socioeconomic History   Marital status: Married    Spouse name: Not on file   Number of children: Not on file   Years of education: Not on file   Highest education level: Not on file  Occupational History   Occupation: retired   Tobacco Use   Smoking status: Former   Smokeless tobacco: Never   Tobacco comments:    12/20/2015 "smoked cigarettes 30-69yr23yr  Vaping Use   Vaping Use: Never used  Substance and Sexual Activity   Alcohol use: Not Currently    Alcohol/week: 5.0 standard drinks    Types: 5 Cans of beer per week    Comment: stopped 4 yrs ago    Drug use: No   Sexual activity: Not Currently    Partners: Female  Other Topics Concern   Not on file  Social History Narrative  Current Social History        Who lives at home: lives with daughter [name] March 15, 2021    Who would speak for you about health care matters: [name] 2021-03-15    Transportation: Has [employer] Transportation and daughter takes to appointments 2021-03-15   Current Stressors: wife and daughter passed away in 09/26/2020 03/15/2021   Work / Education:  worked in the [employer] and with [employer] 03/15/21   Other: limited mobility uses cane and walker and memory decline ( per daughter)  2021-03-15                                                                                                       Social Determinants of Radio broadcast assistant Strain: Low Risk    Difficulty of Paying Living Expenses: Not hard at all  Food Insecurity: No Food Insecurity   Worried About Charity fundraiser in the Last Year: Never true   Arboriculturist in the Last Year: Never true  Transportation Needs: No Transportation Needs   Lack of Transportation (Medical): No   Lack of Transportation (Non-Medical): No  Physical Activity: Inactive   Days of Exercise per Week: 0 days   Minutes of Exercise per Session: 0 min  Stress: Stress Concern Present    Feeling of Stress : Rather much  Social Connections: Socially Isolated   Frequency of Communication with Friends and Family: Once a week   Frequency of Social Gatherings with Friends and Family: Once a week   Attends Religious Services: Never   Marine scientist or Organizations: No   Attends Archivist Meetings: Never   Marital Status: Widowed    Tobacco Counseling Counseling given: No Tobacco comments: 12/20/2015 "smoked cigarettes [age]yrago"   Clinical Intake:  Pre-visit preparation completed: Yes  Pain : 0-10 Pain Score: 4  Pain Type: Neuropathic pain Pain Location: Foot Pain Orientation: Right, Left Pain Radiating Towards: Patient states that the pain radiate up both legs. Pain Descriptors / Indicators: Burning Pain Onset: More than a month ago Pain Frequency: Constant Pain Relieving Factors: Nothing relieves pain. Effect of Pain on Daily Activities: Patient states that it does not affect ADL's  Pain Relieving Factors: Nothing relieves pain.  Nutritional Risks: Nausea/ vomitting/ diarrhea (Diarrhea has been going on for a while.) Diabetes: Yes CBG done?: No Did pt. bring in CBG monitor from home?: No  How often do you need to have someone help you when you read instructions, pamphlets, or other written materials from your doctor or pharmacy?: 2 - Rarely What is the last grade level you completed in school?: Some High School  Diabetic? YES  Interpreter Needed?: No  Information entered by :: [employer] [facility]  Activities of Daily Living In your present state of health, do you have any difficulty performing the following activities: 03/11/2021  Hearing? N  Vision? N  Difficulty concentrating or making decisions? N  Walking or climbing stairs? Y  Dressing or bathing? Y  Doing errands, shopping? Y  Preparing Food and eating ?  Y  Using the Toilet? N  In the past six months, have you accidently leaked urine? N  Do you have  problems with loss of bowel control? N  Managing your Medications? N  Managing your Finances? Y  Housekeeping or managing your Housekeeping? Y  Some recent data might be hidden    Patient Care Team: [name], MD as PCP - General (Family Medicine) Jerline Pain, MD as PCP - Cardiology (Cardiology) Maurine Cane, LCSW as Social Worker (Licensed Clinical Social Worker)  Indicate any recent [employer] you may have received from other than [facility] providers in the past year (date may be approximate).     Assessment:   This is a routine wellness examination for B[place].  Hearing/Vision screen No results found.  Dietary issues and exercise activities discussed: Current Exercise Habits: The patient does not participate in regular exercise at present, Exercise limited by: orthopedic condition(s);cardiac condition(s)   Goals Addressed   None   Depression Screen PHQ 2/9 Scores 03/11/2021 08/09/2020 11/08/2019 11/02/2017 03/19/2017  PHQ - 2 Score 0 4 0 0 0  PHQ- 9 Score 2 12 - - -    Fall Risk Fall Risk  03/11/2021 11/08/2019 09/23/2018 11/02/2017 03/19/2017  Falls in the past year? 0 0 1 No No  Number falls in past yr: 0 0 1 - -  Injury with Fall? 0 0 0 - -  Risk for fall due to : Impaired balance/gait - Impaired balance/gait - -  Follow up Falls evaluation completed - - - -    FALL RISK PREVENTION PERTAINING TO THE HOME:  Any stairs in or around the home? Yes  If so, are there any without handrails? Yes  Home free of loose throw rugs in walkways, pet beds, electrical cords, etc? No  Adequate lighting in your home to reduce risk of falls? Yes   ASSISTIVE DEVICES UTILIZED TO PREVENT FALLS:  Life alert? No  Use of a cane, walker or w/c? Yes  Grab bars in the bathroom? Yes 1 grab bar in the shower Shower chair or bench in shower? Yes  Elevated toilet seat or a handicapped toilet? Yes   TIMED UP AND GO:  Was the test performed?  N/A .  Length of time to ambulate 10 feet: N/A  sec.   Per patient statement: Gait slow and steady with assistive device  Cognitive Function:     6CIT Screen 03/11/2021  What Year? 4 points  What month? 0 points  What time? 0 points  Count back from 20 2 points  Months in reverse 4 points  Repeat phrase 6 points  Total Score 16    Immunizations Immunization History  Administered Date(s) Administered   Fluad Quad(high Dose 65+) 08/09/2020   Influenza, High Dose Seasonal PF 09/23/2018   Influenza,inj,Quad PF,6+ Mos 09/28/2015   PFIZER Comirnaty(Gray Top)Covid-19 Tri-Sucrose Vaccine 08/09/2020, 08/30/2020   Pneumococcal Conjugate-13 09/23/2018   Pneumococcal Polysaccharide-23 08/09/2020    TDAP status: Due, Education has been provided regarding the importance of this vaccine. Advised may receive this vaccine at local pharmacy or Health Dept. Aware to provide a copy of the vaccination record if obtained from local pharmacy or Health Dept. Verbalized acceptance and understanding.  Flu Vaccine status: Due, Education has been provided regarding the importance of this vaccine. Advised may receive this vaccine at local pharmacy or Health Dept. Aware to provide a copy of the vaccination record if obtained from local pharmacy or Health Dept. Verbalized acceptance and understanding.  Pneumococcal vaccine status: Up to date  Covid-19 vaccine status: Information provided on how to obtain vaccines.   Qualifies for Shingles Vaccine? Yes   Zostavax completed No   Shingrix Completed?: No.    Education has been provided regarding the importance of this vaccine. Patient has been advised to call insurance company to determine out of pocket expense if they have not yet received this vaccine. Advised may also receive vaccine at local pharmacy or Health Dept. Verbalized acceptance and understanding.  Screening Tests Health Maintenance  Topic Date Due   TETANUS/TDAP  Never done   Zoster Vaccines- Shingrix (1 of 2) Never done   COLONOSCOPY (Pts  45-69yr Insurance coverage will need to be confirmed)  Never done   COVID-19 Vaccine (3 - Pfizer risk series) 09/27/2020   INFLUENZA VACCINE  10/17/2021 (Originally 02/17/2021)   OPHTHALMOLOGY EXAM  05/13/2021   HEMOGLOBIN A1C  07/02/2021   FOOT EXAM  08/09/2021   Hepatitis C Screening  Completed   PNA vac Low Risk Adult  Completed   HPV VACCINES  Aged Out    Health Maintenance  Health Maintenance Due  Topic Date Due   TETANUS/TDAP  Never done   Zoster Vaccines- Shingrix (1 of 2) Never done   COLONOSCOPY (Pts 45-449yrInsurance coverage will need to be confirmed)  Never done   COVID-19 Vaccine (3 - Pfizer risk series) 09/27/2020    Colorectal cancer screening: Referral to GI placed n/a. Pt aware the office will call re: appt. Patient daughter, MiSharyn Lullstated that an order for cologuard was placed but they were not able to catch the specimen.   Lung Cancer Screening: (Low Dose CT Chest recommended if Age 69-80ears, 30 pack-year currently smoking OR have quit w/in 15years.) does not qualify.   Lung Cancer Screening Referral: N/A  Additional Screening:  Hepatitis C Screening: does qualify; Completed 09/23/2018  Vision Screening: Recommended annual ophthalmology exams for early detection of glaucoma and other disorders of the eye. Is the patient up to date with their annual eye exam?  Yes  Who is the provider or what is the name of the office in which the patient attends annual eye exams? [facility] is not established with a provider, would they like to be referred to a provider to establish care?  N/A .   Dental Screening: Recommended annual dental exams for proper oral hygiene  Community Resource Referral / Chronic Care Management: CRR required this visit?  No   CCM required this visit?  No      Plan:     I have personally reviewed and noted the following in the patient's chart:   Medical and social history Use of alcohol, tobacco or illicit  drugs  Current medications and supplements including opioid prescriptions. Patient is not currently taking opioid prescriptions. Functional ability and status Nutritional status Physical activity Advanced directives List of other physicians Hospitalizations, surgeries, and ER visits in previous 12 months Vitals Screenings to include cognitive, depression, and falls Referrals and appointments  In addition, I have reviewed and discussed with patient certain preventive protocols, quality metrics, and best practice recommendations. A written personalized care plan for preventive services as well as general preventive health recommendations were provided to patient.     [facility] [place] 03/11/2021   Nurse Notes:    Mr. BeHallenbeck Thank you for taking time to come for your Medicare Wellness Visit. I appreciate your ongoing commitment to your health goals. Please  review the following plan we discussed and let me know if I can assist you in the future.   These are the goals we discussed:  Goals      Effective Long-Term Care Planning     Patient Goals/Self-Care Activities : Over the next 60 days Review educational information on Advance Directive  Complete Advance Directive packet,  Have advance directive notarized and provide a copy to provider office Call LCSW if you have questions      Maintain Mobility and Function     Patient Goals/Self-Care Activities: Over the next 30 days Review personal care service information and provider list Select 2 to 3 agencies you would like to use, call [employer] to provide new agency Return calls from [facility] or call them directly if you have questions [phone] or [phone]  [facility] order has been placed        This is a list of the screening recommended for you and due dates:  Health Maintenance  Topic Date Due   Tetanus Vaccine  Never done   Zoster (Shingles) Vaccine (1 of 2) Never done   Colon Cancer Screening  Never  done   COVID-19 Vaccine (3 - Pfizer risk series) 09/27/2020   Flu Shot  10/17/2021*   Eye exam for diabetics  05/13/2021   Hemoglobin A1C  07/02/2021   Complete foot exam   08/09/2021   Hepatitis C Screening: USPSTF Recommendation to screen - Ages 18-79 yo.  Completed   Pneumonia vaccines  Completed   HPV Vaccine  Aged Out  *Topic was postponed. The date shown is not the original due date.

## 2021-03-11 NOTE — Patient Instructions (Signed)
  Mr. Antonio Guerra , Thank you for taking time to come for your Medicare Wellness Visit. I appreciate your ongoing commitment to your health goals. Please review the following plan we discussed and let me know if I can assist you in the future.   These are the goals we discussed:  Goals      Effective Long-Term Care Planning     Patient Goals/Self-Care Activities : Over the next 60 days Review educational information on Advance Directive  Complete Advance Directive packet,  Have advance directive notarized and provide a copy to provider office Call LCSW if you have questions      Maintain Mobility and Function     Patient Goals/Self-Care Activities: Over the next 30 days Review personal care service information and provider list Select 2 to 3 agencies you would like to use, call [employer] to provide new agency Return calls from [facility] or call them directly if you have questions [phone] or [phone]  [facility] order has been placed        This is a list of the screening recommended for you and due dates:  Health Maintenance  Topic Date Due   Tetanus Vaccine  Never done   Zoster (Shingles) Vaccine (1 of 2) Never done   Colon Cancer Screening  Never done   COVID-19 Vaccine (3 - Pfizer risk series) 09/27/2020   Flu Shot  10/17/2021*   Eye exam for diabetics  05/13/2021   Hemoglobin A1C  07/02/2021   Complete foot exam   08/09/2021   Hepatitis C Screening: USPSTF Recommendation to screen - Ages 18-79 yo.  Completed   Pneumonia vaccines  Completed   HPV Vaccine  Aged Out  *Topic was postponed. The date shown is not the original due date.

## 2021-03-19 ENCOUNTER — Ambulatory Visit: Payer: Medicare Other | Admitting: Licensed Clinical Social Worker

## 2021-03-19 DIAGNOSIS — Z741 Need for assistance with personal care: Secondary | ICD-10-CM

## 2021-03-19 DIAGNOSIS — Z719 Counseling, unspecified: Secondary | ICD-10-CM

## 2021-03-19 DIAGNOSIS — Z7189 Other specified counseling: Secondary | ICD-10-CM

## 2021-03-19 DIAGNOSIS — Z636 Dependent relative needing care at home: Secondary | ICD-10-CM

## 2021-03-20 NOTE — Chronic Care Management (AMB) (Signed)
Care Management Clinical Social Work Note  03/20/2021 Name: Antonio Guerra MRN: [id number] DOB: January 20, 1951  Antonio Guerra is a 70 y.o. year old male who is a primary care patient of Antonio Button, MD.  The Care Management team was consulted for assistance with coordination needs.Level of Care Concerns  Consent to Services:  The patient was given information about Care Management services, agreed to services, and gave verbal consent prior to initiation of services.  Please see initial visit note for detailed documentation.   Patient agreed to services today and consent obtained.   Assessment:  Patient's daughter Antonio Guerra provided all information during this encounter.PCS service will start 03/20/21.  [facility] continues to have difficulty locating a [place] agency to provide PT. See Care Plan below for interventions and patient self-care actives.  Follow up Plan: Patient's caregiver would like continued follow-up from CCM LCSW .  per caregiver's request CCM LCSW will follow up in 30 days.  They will call the office if needed prior to next encounter.    Review of patient past medical history, allergies, medications, and health status, including review of relevant consultants reports was performed today as part of a comprehensive evaluation and provision of chronic care management and care coordination services.  SDOH (Social Determinants of Health) assessments and interventions performed:    Advanced Directives Status: See Care Plan for related entries.  Care Plan  Allergies  Allergen Reactions   Lisinopril Other (See Comments)    Caused pt to have chest pains.    Tape Itching and Rash    Outpatient Encounter Medications as of 03/19/2021  Medication Sig Note   aspirin EC 81 MG tablet Take 81 mg by mouth daily. Swallow whole.    atorvastatin (LIPITOR) 80 MG tablet TAKE 1 TABLET(80 MG) BY MOUTH AT BEDTIME    Blood Glucose Monitoring Suppl (ONETOUCH VERIO) w/Device KIT 30 Units by Does not apply route  daily.    carvedilol (COREG) 25 MG tablet TAKE 1 TABLET(25 MG) BY MOUTH TWICE DAILY    clopidogrel (PLAVIX) 75 MG tablet TAKE 1 TABLET(75 MG) BY MOUTH DAILY    clotrimazole (LOTRIMIN) 1 % cream Apply to feet bid x 6 weeks. (Patient not taking: Reported on 03/11/2021)    clotrimazole-betamethasone (LOTRISONE) cream Apply to both feet and between toes bid x 4 weeks.    Continuous Blood Gluc Receiver (FREESTYLE LIBRE 14 DAY READER) DEVI 1 Device by Does not apply route every morning. (Patient not taking: No sig reported)    furosemide (LASIX) 40 MG tablet TAKE 1 TABLET(40 MG) BY MOUTH DAILY    gabapentin (NEURONTIN) 400 MG capsule TAKE 1 CAPSULE BY MOUTH TWICE DAILY    glucose blood (ONETOUCH VERIO) test strip USE TO TEST BLOOD SUGAR 2 TO 3 TIMES A DAY    Incontinence Supply Disposable (DEPEND ADJUSTABLE UNDERWEAR LG) MISC 1 application by Does not apply route as needed.    insulin glargine (LANTUS SOLOSTAR) 100 UNIT/ML Solostar Pen Inject 40 Units into the skin daily.    Insulin Pen Needle (BD PEN NEEDLE NANO U/F) 32G X 4 MM MISC AS DIRECTED TWICE DAILY    isosorbide mononitrate (IMDUR) 30 MG 24 hr tablet TAKE 1 TABLET(30 MG) BY MOUTH DAILY    Lancets (ONETOUCH ULTRASOFT) lancets USE AS DIRECTED    lisinopril (ZESTRIL) 2.5 MG tablet Take 1 tablet (2.5 mg total) by mouth at bedtime. (Patient not taking: Reported on 03/11/2021) 03/11/2021: This caused pt to have chest pains.    nitroGLYCERIN (NITROSTAT) 0.4 MG  SL tablet Place 1 tablet (0.4 mg total) under the tongue every 5 (five) minutes as needed for chest pain. (Patient not taking: Reported on 03/11/2021)    Olopatadine HCl 0.2 % SOLN Apply 1 drop to eye daily.    TRULICITY 1.5 NG/2.9BM SOPN ADMINISTER 1.5 MG UNDER THE SKIN 1 TIME A WEEK    No facility-administered encounter medications on file as of 03/19/2021.    Patient Active Problem List   Diagnosis Date Noted   Stage 3a chronic kidney disease ([name]) 11/08/2019   History of angina 11/08/2019    Intermittent diarrhea 11/08/2019   Type 2 diabetes mellitus with vascular disease ([name]) 12/19/2015   Alcohol intoxication ([name]) 12/19/2015   Substance-induced anxiety disorder ([name]) 12/19/2015   Epigastric pain    Elevated troponin    Type 2 diabetes mellitus ([name]) 09/29/2015   Acute on chronic combined systolic (congestive) and diastolic (congestive) heart failure ([name]) 09/27/2015   Ischemic cardiomyopathy    Hyperlipidemia    Coronary artery disease    Chronic systolic congestive heart failure ([name])    Stroke ([name])     Conditions to be addressed/monitored: ; Level of care concerns  Care Plan : General Social Work (Adult)  Updates made by Maurine Cane, LCSW since 03/20/2021 12:00 AM     Problem: Mobility and Independence      Goal: Mobility and Independence Optimized   Start Date: 02/20/2021  This Visit's Progress: On track  Recent Progress: On track  Priority: High  Note:   Current Barriers:   Unable to located a [name] agency to provide PT Patient unable to consistently perform activities of daily living and needs assistance and support in order to meet this unmet need Clinical Goals: Over the next 45 to 60 days, patient will have personal care needs met as evident by having PCS Aide in the home assisting with needs. Will also have other needs met with Home Health and incontinence supplies.  Clinical Interventions : Assessed needs, progress, steps completed towards goal, and barriers to care  LCSW collaborate with [name] to verify patient case is still open Review various resources, discussed options and provided patient information about Enhanced Benefits connected with insurance provider  [name]); will start 03/20/21 Unable to find [place] for patient will continue to collaborate with [name] referral coordinator;  incontinence supplies provided by [employer], Problem [place] , and Caregiver stress  acknowledged  Inter-disciplinary care team collaboration (see longitudinal plan of care) Patient Goals/Self-Care Activities: Over the next 30 days Congratulations on [facility] services starting again Return calls from [facility] or call them directly if you have questions [phone] or [phone]  We continue to had difficulty locating a [place] agency for PT will keep you posted if an agency is located      Casimer Lanius, [place] / [place]   [phone] 8:23 AM

## 2021-03-20 NOTE — Patient Instructions (Signed)
Visit Information   Goals Addressed             This Visit's Progress    Effective Long-Term Care Planning   Not on track    Patient Goals/Self-Care Activities : Over the next 60 days Review educational information on Advance Directive  Complete Advance Directive packet,  Get photo ID Have advance directive notarized and provide a copy to provider office      Maintain Mobility and Function   On track    Patient Goals/Self-Care Activities: Over the next 30 days Congratulations on [name] services starting again Return calls from [facility] or call them directly if you have questions [phone] or [phone]  We continue to had difficulty locating a [place] agency for PT will keep you posted if an agency is located        It was a pleasure speaking with you today. Please call the office if needed No follow up scheduled, per our conversation I will contact you in 30 days.  Patient's Daughter Essie Christine understanding of instructions provided today.   Casimer Lanius, LCSW Care Management & Coordination  [phone]

## 2021-03-31 ENCOUNTER — Encounter (INDEPENDENT_AMBULATORY_CARE_PROVIDER_SITE_OTHER): Payer: Medicare Other | Admitting: Ophthalmology

## 2021-03-31 ENCOUNTER — Other Ambulatory Visit: Payer: Self-pay

## 2021-03-31 ENCOUNTER — Encounter (INDEPENDENT_AMBULATORY_CARE_PROVIDER_SITE_OTHER): Payer: Medicaid Other | Admitting: Ophthalmology

## 2021-03-31 DIAGNOSIS — H43813 Vitreous degeneration, bilateral: Secondary | ICD-10-CM

## 2021-03-31 DIAGNOSIS — E113513 Type 2 diabetes mellitus with proliferative diabetic retinopathy with macular edema, bilateral: Secondary | ICD-10-CM | POA: Diagnosis not present

## 2021-03-31 DIAGNOSIS — H35033 Hypertensive retinopathy, bilateral: Secondary | ICD-10-CM

## 2021-03-31 DIAGNOSIS — I10 Essential (primary) hypertension: Secondary | ICD-10-CM

## 2021-04-08 ENCOUNTER — Other Ambulatory Visit: Payer: Self-pay | Admitting: Family Medicine

## 2021-04-08 DIAGNOSIS — Z8679 Personal history of other diseases of the circulatory system: Secondary | ICD-10-CM

## 2021-04-09 ENCOUNTER — Other Ambulatory Visit: Payer: Self-pay | Admitting: Family Medicine

## 2021-04-09 DIAGNOSIS — E1159 Type 2 diabetes mellitus with other circulatory complications: Secondary | ICD-10-CM

## 2021-04-14 ENCOUNTER — Other Ambulatory Visit: Payer: Self-pay | Admitting: Family Medicine

## 2021-04-19 ENCOUNTER — Other Ambulatory Visit: Payer: Self-pay | Admitting: Family Medicine

## 2021-04-25 ENCOUNTER — Other Ambulatory Visit: Payer: Self-pay | Admitting: Family Medicine

## 2021-04-25 DIAGNOSIS — I10 Essential (primary) hypertension: Secondary | ICD-10-CM

## 2021-04-28 ENCOUNTER — Other Ambulatory Visit: Payer: Self-pay

## 2021-04-28 ENCOUNTER — Encounter (INDEPENDENT_AMBULATORY_CARE_PROVIDER_SITE_OTHER): Payer: Medicare Other | Admitting: Ophthalmology

## 2021-04-28 DIAGNOSIS — H43813 Vitreous degeneration, bilateral: Secondary | ICD-10-CM

## 2021-04-28 DIAGNOSIS — E113513 Type 2 diabetes mellitus with proliferative diabetic retinopathy with macular edema, bilateral: Secondary | ICD-10-CM | POA: Diagnosis not present

## 2021-04-28 DIAGNOSIS — I10 Essential (primary) hypertension: Secondary | ICD-10-CM | POA: Diagnosis not present

## 2021-04-28 DIAGNOSIS — H35033 Hypertensive retinopathy, bilateral: Secondary | ICD-10-CM

## 2021-05-05 ENCOUNTER — Ambulatory Visit: Payer: Medicare Other | Admitting: Licensed Clinical Social Worker

## 2021-05-05 DIAGNOSIS — Z741 Need for assistance with personal care: Secondary | ICD-10-CM

## 2021-05-05 DIAGNOSIS — Z636 Dependent relative needing care at home: Secondary | ICD-10-CM

## 2021-05-05 NOTE — Chronic Care Management (AMB) (Signed)
Care Management  Clinical Social Work Note  05/05/2021 Name: Antonio Guerra MRN: [id number] DOB: 12-May-1951  Antonio Guerra is a 70 y.o. year old male who is a primary care patient of [name], MD. The CCM team was consulted for assistance with care coordination needs. Level of Care Concerns   Consent to Services:  The patient was given information about Care Management services, agreed to services, and gave verbal consent prior to initiation of services.  Please see initial visit note for detailed documentation.   Patient agreed to services today and consent obtained.  Collaboration with patient's daughter  for follow up visit in response to provider referral for social work care coordination services.   Assessment/Interventions: Assessed patient's current treatment, progress, coping skills, support system and barriers to care.  He continues to experience difficulty with keeping home health aide and was recently discharged. Daughter working to enroll patient into the [facility] program . See Care Plan below for interventions and patient self-care actives.  Follow up Plan:  No follow up schedules as patient will be transitioning to the [facility]ACE program CCM LCSW will continue to collaborate with [facility] in order to meet patient's needs . Patient may benefit from and is in agreement for CCM LCSW to remain part of care team for the next 30 days.  If no needs are identified in the next 30 days, CCM LCSW will disconnect from the care team.   Review of patient past medical history, allergies, medications, and health status, including review of pertinent consultant reports was performed as part of comprehensive evaluation and provision of care management/care coordination services.   SDOH (Social Determinants of Health) screening and interventions performed today:   Advanced Directives Status:Not addressed in this encounter.     Care Plan    Conditions to be addressed/monitored per PCP order: , Level of  care concerns  Care Plan : General Social Work (Adult)  Updates made by Antonio Cane, LCSW since 05/05/2021 12:00 AM     Problem: Mobility and Independence      Goal: Mobility and Independence Optimized   Start Date: 02/20/2021  Recent Progress: On track  Priority: High  Note:   Current Barriers:   Connected with [facility] but they have now discharged pt Patient unable to consistently perform activities of daily living and needs assistance and support in order to meet this unmet need Clinical Goals: Over the next 45 to 60 days, patient will have personal care needs met as evident by having PCS Aide in the home assisting with needs. Will also have other needs met with Home Health and incontinence supplies.  Clinical Interventions : Assessed needs, progress, steps completed towards goal, and barriers to care  LCSW collaborate with [facility] to verify patient case is still open Review various resources, discussed options and provided patient information about Enhanced Benefits connected with insurance provider  [employer], Problem [place] , and Caregiver stress acknowledged  Patient and family have decided to enroll in the [facility] program; anticipate effective date Nov. 1st  LCSW collaborated with [facility] intake work for transition Inter-disciplinary care team collaboration (see longitudinal plan of care) Patient Goals/Self-Care Activities: Over the next 30 days Congratulations on connecting with the [facility]ACE program I have contacted [facility] intake to collaborate with the transition     Antonio Guerra, [place] / [place]   [phone]

## 2021-05-05 NOTE — Patient Instructions (Signed)
Visit Information   Goals Addressed             This Visit's Progress    Maintain Mobility and Function       Patient Goals/Self-Care Activities:  Congratulations on connecting with the [facility] program I have contacted [facility] intake to collaborate with the transition        It was a pleasure speaking with you today. Please call the office if needed Patient's daughter verbalizes understanding of instructions provided today.  Per our conversation I will remain part of your care team for the next 30 days.  If no needs are identified in the next 30 days, I will disconnect from the care team.  Casimer Lanius, [place] Management & Coordination  [phone]

## 2021-05-17 ENCOUNTER — Other Ambulatory Visit: Payer: Self-pay | Admitting: Family Medicine

## 2021-05-26 ENCOUNTER — Encounter (INDEPENDENT_AMBULATORY_CARE_PROVIDER_SITE_OTHER): Payer: Medicare Other | Admitting: Ophthalmology

## 2021-05-26 ENCOUNTER — Encounter (INDEPENDENT_AMBULATORY_CARE_PROVIDER_SITE_OTHER): Payer: Self-pay

## 2021-05-26 ENCOUNTER — Other Ambulatory Visit: Payer: Self-pay

## 2021-05-28 ENCOUNTER — Ambulatory Visit: Payer: Medicare Other | Admitting: Podiatry

## 2021-06-05 ENCOUNTER — Ambulatory Visit: Payer: Self-pay | Admitting: Licensed Clinical Social Worker

## 2021-06-05 DIAGNOSIS — Z7689 Persons encountering health services in other specified circumstances: Secondary | ICD-10-CM

## 2021-06-05 NOTE — Chronic Care Management (AMB) (Signed)
  Care Management  Collaboration  Note  06/05/2021 Name: Antonio Guerra MRN: [id number] DOB: [birth year]  Antonio Guerra is a 70 y.o. year old male who is a primary care patient of [name], MD. The CCM team was consulted reference care coordination needs for Level of Care Concerns.for [facility]  Assessment: Patient was not interviewed or contacted during this encounter. Patient will no longer receive primary care from [facility], He is now active with the [facility] program. See Care Plan or interventions for patient self-care actives.   Intervention:Conducted brief assessment, recommendations and relevant information discussed. CCM LCSW collaborated with [employer] intake for the [employer].  Patient has started program and making a good transition .    Follow up Plan: All care plan goals have been met. Will disconnect from care team    Review of patient past medical history, allergies, medications, and health status, including review of pertinent consultant reports was performed as part of comprehensive evaluation and provision of care management/care coordination services.   Care Plan Conditions to be addressed/monitored per PCP order: , Level of care concerns   Care Plan : General Social Work (Adult)  Updates made by Maurine Cane, LCSW since 06/05/2021 12:00 AM     Problem: Mobility and Independence      Goal: Mobility and Independence Optimized Completed 06/05/2021  Start Date: 02/20/2021  This Visit's Progress: On track  Recent Progress: On track  Priority: High  Note:   Current Barriers:   Patient is now active with and will receive all care from the [facility] program Patient unable to consistently perform activities of daily living and needs assistance and support in order to meet this unmet need Clinical Goals: Over the next 45 to 60 days, patient will have personal care needs met as evident by having PCS Aide in the home assisting with needs. Will also have other needs met  with Home Health and incontinence supplies.  Clinical Interventions : Assessed needs, progress, steps completed towards goal, and barriers to care  LCSW collaborate with [name] to verify patient case is still open Review various resources, discussed options and provided patient information about Enhanced Benefits connected with insurance provider  [employer], Problem [place] , and Caregiver stress acknowledged  Patient and family have decided to enroll in the [facility] program; anticipate effective date Nov. 1st  LCSW collaborated with [facility] intake work for transition Inter-disciplinary care team collaboration (see longitudinal plan of care) Patient Goals/Self-Care Activities: Over the next 30 days Congratulations on connecting with the [facility] program I have contacted [facility] intake to collaborate with the transition     Casimer Lanius, [place] / [place]   [phone]

## 2021-06-05 NOTE — Patient Instructions (Signed)
   Patient was not contacted during this encounter.  LCSW collaborated with care team to accomplish patient's care plan goal   Avielle Imbert, LCSW Care Management & Coordination  [phone]  

## 2021-07-07 ENCOUNTER — Encounter (INDEPENDENT_AMBULATORY_CARE_PROVIDER_SITE_OTHER): Payer: Medicare (Managed Care) | Admitting: Ophthalmology

## 2021-07-07 ENCOUNTER — Other Ambulatory Visit: Payer: Self-pay

## 2021-07-07 DIAGNOSIS — E113513 Type 2 diabetes mellitus with proliferative diabetic retinopathy with macular edema, bilateral: Secondary | ICD-10-CM | POA: Diagnosis not present

## 2021-07-07 DIAGNOSIS — H43813 Vitreous degeneration, bilateral: Secondary | ICD-10-CM | POA: Diagnosis not present

## 2021-07-07 DIAGNOSIS — H35033 Hypertensive retinopathy, bilateral: Secondary | ICD-10-CM

## 2021-07-07 DIAGNOSIS — I10 Essential (primary) hypertension: Secondary | ICD-10-CM | POA: Diagnosis not present

## 2021-07-07 DIAGNOSIS — H2513 Age-related nuclear cataract, bilateral: Secondary | ICD-10-CM

## 2021-08-01 ENCOUNTER — Other Ambulatory Visit: Payer: Self-pay | Admitting: Family Medicine

## 2021-08-01 DIAGNOSIS — I10 Essential (primary) hypertension: Secondary | ICD-10-CM

## 2021-08-04 ENCOUNTER — Encounter (INDEPENDENT_AMBULATORY_CARE_PROVIDER_SITE_OTHER): Payer: Medicare (Managed Care) | Admitting: Ophthalmology

## 2021-08-11 ENCOUNTER — Encounter (INDEPENDENT_AMBULATORY_CARE_PROVIDER_SITE_OTHER): Payer: Medicare (Managed Care) | Admitting: Ophthalmology

## 2021-08-11 ENCOUNTER — Other Ambulatory Visit: Payer: Self-pay

## 2021-08-11 DIAGNOSIS — I10 Essential (primary) hypertension: Secondary | ICD-10-CM

## 2021-08-11 DIAGNOSIS — H35033 Hypertensive retinopathy, bilateral: Secondary | ICD-10-CM

## 2021-08-11 DIAGNOSIS — H43813 Vitreous degeneration, bilateral: Secondary | ICD-10-CM | POA: Diagnosis not present

## 2021-08-11 DIAGNOSIS — E113513 Type 2 diabetes mellitus with proliferative diabetic retinopathy with macular edema, bilateral: Secondary | ICD-10-CM

## 2021-09-08 ENCOUNTER — Encounter (INDEPENDENT_AMBULATORY_CARE_PROVIDER_SITE_OTHER): Payer: Medicare (Managed Care) | Admitting: Ophthalmology

## 2021-11-04 ENCOUNTER — Telehealth: Payer: Self-pay | Admitting: Cardiology

## 2021-11-04 NOTE — Telephone Encounter (Signed)
Dr. Sabra Heck is pt's PCP, she states that she a question for Dr. Marlou Porch regarding pt. Please advise ?

## 2021-11-04 NOTE — Telephone Encounter (Signed)
I returned call and spoke with Clemens Catholic patient's PCP.  She is asking why patient is on dual antiplatelet therapy and if he can be changed to just Aspirin or Clopidogrel.  Patient is not having any bleeding issues.  ?

## 2021-11-05 NOTE — Telephone Encounter (Signed)
I spoke with Antonio Guerra and gave her information from Dr Marlou Porch ?

## 2021-11-05 NOTE — Telephone Encounter (Signed)
Jerline Pain, MD    ? ?Sure, I think would be fine to continue with Plavix 75 mg and stop aspirin 81 mg.  ?Candee Furbish, MD   ? ?

## 2021-11-12 ENCOUNTER — Encounter (INDEPENDENT_AMBULATORY_CARE_PROVIDER_SITE_OTHER): Payer: Medicare (Managed Care) | Admitting: Ophthalmology

## 2021-11-12 DIAGNOSIS — I10 Essential (primary) hypertension: Secondary | ICD-10-CM

## 2021-11-12 DIAGNOSIS — H43813 Vitreous degeneration, bilateral: Secondary | ICD-10-CM | POA: Diagnosis not present

## 2021-11-12 DIAGNOSIS — E113513 Type 2 diabetes mellitus with proliferative diabetic retinopathy with macular edema, bilateral: Secondary | ICD-10-CM | POA: Diagnosis not present

## 2021-11-12 DIAGNOSIS — H35033 Hypertensive retinopathy, bilateral: Secondary | ICD-10-CM | POA: Diagnosis not present

## 2021-12-10 ENCOUNTER — Encounter (INDEPENDENT_AMBULATORY_CARE_PROVIDER_SITE_OTHER): Payer: Medicare (Managed Care) | Admitting: Ophthalmology

## 2021-12-10 DIAGNOSIS — I10 Essential (primary) hypertension: Secondary | ICD-10-CM

## 2021-12-10 DIAGNOSIS — H35033 Hypertensive retinopathy, bilateral: Secondary | ICD-10-CM | POA: Diagnosis not present

## 2021-12-10 DIAGNOSIS — H43813 Vitreous degeneration, bilateral: Secondary | ICD-10-CM

## 2021-12-10 DIAGNOSIS — E113513 Type 2 diabetes mellitus with proliferative diabetic retinopathy with macular edema, bilateral: Secondary | ICD-10-CM

## 2021-12-23 ENCOUNTER — Other Ambulatory Visit: Payer: Self-pay | Admitting: Family Medicine

## 2021-12-23 ENCOUNTER — Ambulatory Visit
Admission: RE | Admit: 2021-12-23 | Discharge: 2021-12-23 | Disposition: A | Discharge status: Home / Self Care | Payer: Medicare (Managed Care) | Source: Ambulatory Visit | Attending: Family Medicine | Admitting: Family Medicine

## 2021-12-23 DIAGNOSIS — Z0001 Encounter for general adult medical examination with abnormal findings: Secondary | ICD-10-CM

## 2022-01-07 ENCOUNTER — Encounter (INDEPENDENT_AMBULATORY_CARE_PROVIDER_SITE_OTHER): Payer: Medicare (Managed Care) | Admitting: Ophthalmology

## 2022-01-07 DIAGNOSIS — E113513 Type 2 diabetes mellitus with proliferative diabetic retinopathy with macular edema, bilateral: Secondary | ICD-10-CM | POA: Diagnosis not present

## 2022-01-07 DIAGNOSIS — H35033 Hypertensive retinopathy, bilateral: Secondary | ICD-10-CM | POA: Diagnosis not present

## 2022-01-07 DIAGNOSIS — H43813 Vitreous degeneration, bilateral: Secondary | ICD-10-CM | POA: Diagnosis not present

## 2022-01-07 DIAGNOSIS — I10 Essential (primary) hypertension: Secondary | ICD-10-CM

## 2022-01-26 ENCOUNTER — Encounter: Payer: Self-pay | Admitting: *Deleted

## 2022-01-26 ENCOUNTER — Other Ambulatory Visit: Payer: Self-pay | Admitting: Surgery

## 2022-01-26 ENCOUNTER — Ambulatory Visit: Payer: Self-pay | Admitting: Surgery

## 2022-01-26 DIAGNOSIS — R590 Localized enlarged lymph nodes: Secondary | ICD-10-CM | POA: Insufficient documentation

## 2022-01-26 NOTE — Progress Notes (Unsigned)
Criselda Peaches, MD  Roosvelt Maser; P Ir Procedure Requests [place] core biopsy of right superficial inguinal mass.  No sedation needed.  Schedule ASAP.   [place]

## 2022-01-27 ENCOUNTER — Ambulatory Visit (HOSPITAL_COMMUNITY)
Admission: RE | Admit: 2022-01-27 | Discharge: 2022-01-27 | Disposition: A | Discharge status: Home / Self Care | Payer: Medicare (Managed Care) | Source: Ambulatory Visit | Attending: Surgery | Admitting: Surgery

## 2022-01-27 DIAGNOSIS — R590 Localized enlarged lymph nodes: Secondary | ICD-10-CM | POA: Insufficient documentation

## 2022-01-27 DIAGNOSIS — C7A1 Malignant poorly differentiated neuroendocrine tumors: Secondary | ICD-10-CM | POA: Diagnosis present

## 2022-01-27 MED ORDER — LIDOCAINE HCL (PF) 1 % IJ SOLN
INTRAMUSCULAR | Status: AC
  Filled 2022-01-27: qty 30

## 2022-01-27 NOTE — Procedures (Signed)
Interventional Radiology Procedure Note  Procedure: US guided biopsy right inguinal lymph mass. Complications: None EBL: None Recommendations:   - Routine wound care - Follow up pathology - Advance diet   Signed,  Corrie Mckusick, DO

## 2022-02-02 LAB — SURGICAL PATHOLOGY

## 2022-02-03 LAB — SURGICAL PATHOLOGY

## 2022-02-04 ENCOUNTER — Encounter (INDEPENDENT_AMBULATORY_CARE_PROVIDER_SITE_OTHER): Payer: Medicare (Managed Care) | Admitting: Ophthalmology

## 2022-02-04 DIAGNOSIS — E113513 Type 2 diabetes mellitus with proliferative diabetic retinopathy with macular edema, bilateral: Secondary | ICD-10-CM | POA: Diagnosis not present

## 2022-02-04 DIAGNOSIS — I10 Essential (primary) hypertension: Secondary | ICD-10-CM | POA: Diagnosis not present

## 2022-02-04 DIAGNOSIS — H43813 Vitreous degeneration, bilateral: Secondary | ICD-10-CM | POA: Diagnosis not present

## 2022-02-04 DIAGNOSIS — H35033 Hypertensive retinopathy, bilateral: Secondary | ICD-10-CM

## 2022-02-09 ENCOUNTER — Telehealth: Payer: Self-pay | Admitting: Oncology

## 2022-02-09 NOTE — Telephone Encounter (Signed)
Scheduled appt per 7/19 referral. Pt is aware of appt date and time. Pt is aware to arrive 15 mins prior to appt time and to bring and updated insurance card. Pt is aware of appt location.   

## 2022-02-10 ENCOUNTER — Telehealth: Payer: Self-pay | Admitting: Hematology and Oncology

## 2022-02-10 NOTE — Telephone Encounter (Signed)
R/s pt's appt to an earlier date. Called pt's daughter, [name] 2x, no answer. Left msg with new appt date/time and requested for her to call back to confirm. Called [facility], spoke to [place]. They are aware of pt's new appt date/time and will have transportation arranged.

## 2022-02-16 ENCOUNTER — Other Ambulatory Visit: Payer: Self-pay

## 2022-02-16 ENCOUNTER — Encounter: Payer: Self-pay | Admitting: Hematology and Oncology

## 2022-02-16 ENCOUNTER — Other Ambulatory Visit: Payer: Self-pay | Admitting: *Deleted

## 2022-02-16 ENCOUNTER — Inpatient Hospital Stay: Payer: Medicare (Managed Care) | Attending: Oncology | Admitting: Hematology and Oncology

## 2022-02-16 ENCOUNTER — Inpatient Hospital Stay: Payer: Medicare (Managed Care)

## 2022-02-16 VITALS — BP 129/76 | HR 111 | Temp 97.9°F | Resp 18 | Ht 73.0 in | Wt 186.9 lb

## 2022-02-16 DIAGNOSIS — C4A59 Merkel cell carcinoma of other part of trunk: Secondary | ICD-10-CM | POA: Diagnosis not present

## 2022-02-16 DIAGNOSIS — C4A9 Merkel cell carcinoma, unspecified: Secondary | ICD-10-CM

## 2022-02-16 LAB — CMP (CANCER CENTER ONLY)
ALT: 25 U/L (ref 0–44)
AST: 36 U/L (ref 15–41)
Albumin: 4 g/dL (ref 3.5–5.0)
Alkaline Phosphatase: 109 U/L (ref 38–126)
Anion gap: 4 — ABNORMAL LOW (ref 5–15)
BUN: 21 mg/dL (ref 8–23)
CO2: 29 mmol/L (ref 22–32)
Calcium: 10 mg/dL (ref 8.9–10.3)
Chloride: 107 mmol/L (ref 98–111)
Creatinine: 1.57 mg/dL — ABNORMAL HIGH (ref 0.61–1.24)
GFR, Estimated: 47 mL/min — ABNORMAL LOW (ref >60)
Glucose, Bld: 128 mg/dL — ABNORMAL HIGH (ref 70–99)
Potassium: 5.2 mmol/L — ABNORMAL HIGH (ref 3.5–5.1)
Sodium: 140 mmol/L (ref 135–145)
Total Bilirubin: 0.7 mg/dL (ref 0.3–1.2)
Total Protein: 8.2 g/dL — ABNORMAL HIGH (ref 6.5–8.1)

## 2022-02-16 LAB — CBC WITH DIFFERENTIAL/PLATELET
Abs Immature Granulocytes: 0.03 10*3/uL (ref 0.00–0.07)
Basophils Absolute: 0 10*3/uL (ref 0.0–0.1)
Basophils Relative: 1 %
Eosinophils Absolute: 0.1 10*3/uL (ref 0.0–0.5)
Eosinophils Relative: 2 %
HCT: 37.9 % — ABNORMAL LOW (ref 39.0–52.0)
Hemoglobin: 12.6 g/dL — ABNORMAL LOW (ref 13.0–17.0)
Immature Granulocytes: 1 %
Lymphocytes Relative: 29 %
Lymphs Abs: 1.6 10*3/uL (ref 0.7–4.0)
MCH: 27.4 pg (ref 26.0–34.0)
MCHC: 33.2 g/dL (ref 30.0–36.0)
MCV: 82.4 fL (ref 80.0–100.0)
Monocytes Absolute: 0.6 10*3/uL (ref 0.1–1.0)
Monocytes Relative: 11 %
Neutro Abs: 3.1 10*3/uL (ref 1.7–7.7)
Neutrophils Relative %: 56 %
Platelets: 272 10*3/uL (ref 150–400)
RBC: 4.6 MIL/uL (ref 4.22–5.81)
RDW: 14.3 % (ref 11.5–15.5)
WBC: 5.4 10*3/uL (ref 4.0–10.5)
nRBC: 0 % (ref 0.0–0.2)

## 2022-02-16 MED ORDER — TRAMADOL HCL 50 MG PO TABS: 50.0000 mg | ORAL_TABLET | Freq: Three times a day (TID) | ORAL | 0 refills | Status: DC | PRN

## 2022-02-16 MED ORDER — TRAMADOL HCL 50 MG PO TABS
50.0000 mg | ORAL_TABLET | Freq: Three times a day (TID) | ORAL | 0 refills | Status: DC | PRN
Start: 2022-02-16 — End: 2022-05-15

## 2022-02-16 NOTE — Assessment & Plan Note (Signed)
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  Patient reports this lump for the past 3 months and has gotten progressively larger.  He denies any other symptoms.  At baseline he is very inactive, mostly watches TV or stays sedentary especially since he lost his wife who he was married to for almost 49 years.  Prior to all of this, he was reasonably active, can go up and down 16 flights of stairs, walk around and independent with activities of daily living. Physical examination today, large right groin mass measuring 6 to 7 cm in largest dimension.  No other palpable lymphadenopathy.  Complete skin exam unable to be obtained since patient cannot stand according to the daughter. We have discussed about completing staging with a PET/CT, ordered this stat.  This has to be coordinated with the pace program. We have briefly discussed about considering neoadjuvant immunotherapy if this is localized disease based on limited literature.  If this is indeed metastatic, I would still recommend first-line immunotherapy since excellent response can be obtained with immunotherapy.  I briefly discussed today about mechanism of action of immunotherapy, adverse effects of immunotherapy including but not limited to fatigue, skin rash, hypo-/hyperthyroidism, diarrhea, transaminitis.  We have also discussed that in under 5% of the patients, immunotherapy can cause life-threatening and permanent side effects including but not limited to nephritis, hypophysitis, myocarditis etc.  He and daughter expressed understanding of the recommendations. For cancer related pain, I have ordered some tramadol 50 mg every 8 hours as needed for pain control.  Our nurse has coordinated with the pace program coordinator for expedited  care.  He will return to clinic in a week for recommendations.

## 2022-02-16 NOTE — Progress Notes (Signed)
[name] NOTE  Patient Care Team: [name], MD as PCP - General (Family Medicine) Jerline Pain, MD as PCP - Cardiology (Cardiology) [facility] 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  Patient reports this lump for the past 3 months and has gotten progressively larger.  He denies any other symptoms.  At baseline he is very inactive, mostly watches TV or stays sedentary especially since he lost his wife who he was married to for almost 41 years.  Prior to all of this, he was reasonably active, can go up and down 16 flights of stairs, walk around and independent with activities of daily living. Physical examination today, large right groin mass measuring 6 to 7 cm in largest dimension.  No other palpable lymphadenopathy.  Complete skin exam unable to be obtained since patient cannot stand according to the daughter. We have discussed about completing staging with a PET/CT, ordered this stat.  This has to be coordinated with the pace program. We have briefly discussed about considering neoadjuvant immunotherapy if this is localized disease based on limited literature.  If this is indeed metastatic, I would still recommend first-line immunotherapy since excellent response can be obtained with immunotherapy.  I briefly discussed today about mechanism of action of immunotherapy, adverse effects of immunotherapy including but not limited to fatigue, skin rash, hypo-/hyperthyroidism, diarrhea, transaminitis.  We have also discussed that in under 5% of the patients, immunotherapy can cause  life-threatening and permanent side effects including but not limited to nephritis, hypophysitis, myocarditis etc.  He and daughter expressed understanding of the recommendations. For cancer related pain, I have ordered some tramadol 50 mg every 8 hours as needed for pain control.  Our nurse has coordinated with the pace program coordinator for expedited care.  He will return to clinic in a week for recommendations.   Orders Placed This Encounter  Procedures   NM PET Image Restag (PS) Skull Base To Thigh    Standing Status:   Future    Standing Expiration Date:   02/16/2023    Order Specific Question:   If indicated for the ordered procedure, I authorize the administration of a radiopharmaceutical per Radiology protocol    Answer:   Yes    Order Specific Question:   Preferred imaging location?    Answer:   [place]   CBC with Differential/Platelet    Standing Status:   Standing    Number of Occurrences:   22    Standing Expiration Date:   02/17/2023   Comprehensive metabolic panel    Standing Status:   Standing    Number of Occurrences:   33    Standing Expiration Date:   02/17/2023     HISTORY OF PRESENTING ILLNESS:  Antonio Guerra 71 y.o. male is here because of merkel cell carcinoma  This is a 71 year old male patient who was evaluated for right groin pain, questionable boil or hernia.  CT pelvis demonstrated 4.7 x 5.2 x 6.6 cm nodular lobulated mass at the right inguinal region recommend a needle biopsy.  Ultrasound-guided core biopsy of the right inguinal mass demonstrated poorly differentiated carcinoma with neuroendocrine features suggestive of  a Merkel cell carcinoma. He did not have any systemic staging.  Past medical history significant for congestive heart failure with ejection fraction of 20 to 25%, coronary artery disease, hypertension, history of MI, type 2 diabetes mellitus. He arrived to the appointment today with his daughter.  He was in a wheelchair, daughter is the  main historian.  Her daughter tells me that since his wife passed away 2 and half years ago and he also lost 2 children, he has become disinterested and he mostly stays at home.  He is now part of the pace program and they help him with staying active and taking care of him while she is working.  Many years ago back in 2010 or 2012, he had a stroke and heart attack.  He used to follow-up with cardiology once a year but has not recently.  His last follow-up was back in 2021.  He reports that this lump in the right groin has started about 3 months ago and has progressively increased in size.  He denies any other lumps.  He denies any new onset cough, chest pain, shortness of breath, bone pains, headaches, double vision. According to the daughter, diabetes is well controlled.  Rest of the pertinent 10 point ROS reviewed and negative.  MEDICAL HISTORY:  Past Medical History:  Diagnosis Date   Altered mental status    Arthritis    "all over" (12/20/2015)   Chronic lower back pain    Chronic systolic congestive heart failure ([place])    a. 04/2014 Echo: EF 20-25%.   Coronary artery disease    a. 2011 MI x 2 with PCI: stent x 2 (LAD and RI) @ [facility] in [place], [place];  b. 04/2014 Cath/PCI: LM 10-20, LAD 40p, 64m 827mSR(3.5x38 Xience DES), 50apical, LCX 20 diffuse, RI 50p, 2075mR, RCA 40-40m78md.   Fibromyalgia    Hyperlipidemia    Hypertension    Hypertensive urgency 09/27/2015   [facility]   [facility]   "right hand weaker since" (12/20/2015)   Type II diabetes mellitus (HCC)[place]  SURGICAL HISTORY: Past Surgical History:  Procedure Laterality Date   ANKLE FRACTURE SURGERY Right    CARDIAC CATHETERIZATION N/A 09/30/2015   Procedure: Left Heart Cath and Coronary Angiography;  Surgeon: ChriBurnell Blanks;  [facility];  Service: Cardiovascular;  Laterality: N/A;   CORONARY ANGIOPLASTY WITH STENT PLACEMENT     "total of 3 stents" (12/20/2015)    FRACTIONAL FLOW RESERVE WIRE  05/11/2014   Procedure: FRACTIONAL FLOW RESERVE WIRE;  Surgeon: ChriBurnell Blanks;  [facility];  Service: Cardiovascular;;   FRACTURE SURGERY     LEFT HEART CATHETERIZATION WITH CORONARY/GRAFT ANGIOGRAM N/A 05/11/2014   Procedure: LEFT HEART CATHETERIZATION WITH COROBeatrix Fettersurgeon: ChriBurnell Blanks;  [facility];  Service: Cardiovascular;  Laterality: N/A;   PERCUTANEOUS CORONARY STENT INTERVENTION (PCI-S)  05/11/2014   Procedure: PERCUTANEOUS CORONARY STENT INTERVENTION (PCI-S);  Surgeon: ChriBurnell Blanks;  [facility];  Service: Cardiovascular;;    SOCIAL HISTORY: Social History   Socioeconomic History   Marital status: Married    Spouse name: Not on file   Number of children: Not on file   Years of education: Not on file   Highest education level: Not on file  Occupational History   Occupation: retired   Tobacco Use   Smoking status: Former   Smokeless tobacco: Never  Tobacco comments:    12/20/2015 "smoked cigarettes 30-58yrago"  Vaping Use   Vaping Use: Never used  Substance and Sexual Activity   Alcohol use: Not Currently    Alcohol/week: 5.0 standard drinks of alcohol    Types: 5 Cans of beer per week    Comment: stopped 4 yrs ago    Drug use: No   Sexual activity: Not Currently    Partners: Female  Other Topics Concern   Not on file  Social History Narrative   Current Social History        Who lives at home: lives with daughter MSharyn Lull808/17/2022   Who would speak for you about health care matters: MSharyn Lull82022/08/17   Transportation: Has [employer] and daughter takes to appointments 82022/08/17  Current Stressors: wife and daughter passed away in [phone]-[phone]  Work / Education:  [place] [employer]808-17-22  Other: limited mobility uses cane and walker and memory decline ( per daughter)  808/17/22                                                                                                       Social Determinants of Health   Financial Resource Strain: Low Risk  (03/11/2021)   Overall Financial Resource Strain (CARDIA)    Difficulty of Paying Living Expenses: Not hard at all  Food Insecurity: No Food Insecurity (03/11/2021)   Hunger Vital Sign    Worried About Running Out of Food in the Last Year: Never true    RAlexandriain the Last Year: Never true  Transportation Needs: No Transportation Needs (03/11/2021)   PRAPARE - THydrologist(Medical): No    Lack of Transportation (Non-Medical): No  Physical Activity: Inactive (03/11/2021)   Exercise Vital Sign    Days of Exercise per Week: 0 days    Minutes of Exercise per Session: 0 min  Stress: Stress Concern Present (03/11/2021)   [place]   Feeling of Stress : Rather much  Social Connections: Socially Isolated (03/11/2021)   Social Connection and Isolation Panel [NHANES]    Frequency of Communication with Friends and Family: Once a week    Frequency of Social Gatherings with Friends and Family: Once a week    Attends Religious Services: Never    AMarine scientistor Organizations: No    Attends CArchivistMeetings: Never    Marital Status: Widowed  Intimate Partner Violence: Not At Risk (03/11/2021)   Humiliation, Afraid, Rape, and Kick questionnaire    Fear of Current or Ex-Partner: No    Emotionally Abused: No    Physically Abused: No    Sexually Abused: No    FAMILY HISTORY: Family History  Problem Relation Age of Onset   Diabetes Mother    CAD Neg Hx    Stomach cancer Neg Hx    Colon cancer Neg Hx    Esophageal cancer Neg Hx  Pancreatic cancer Neg Hx     ALLERGIES:  is allergic to lisinopril and tape.  MEDICATIONS:  Current Outpatient Medications  Medication Sig Dispense Refill   atorvastatin (LIPITOR) 80 MG tablet TAKE 1  TABLET(80 MG) BY MOUTH AT BEDTIME 90 tablet 3   Blood Glucose Monitoring Suppl (ONETOUCH VERIO) w/Device KIT 30 Units by Does not apply route daily. 1 kit 0   carvedilol (COREG) 25 MG tablet TAKE 1 TABLET(25 MG) BY MOUTH TWICE DAILY 180 tablet 0   clopidogrel (PLAVIX) 75 MG tablet TAKE 1 TABLET(75 MG) BY MOUTH DAILY 90 tablet 2   Continuous Blood Gluc Receiver (FREESTYLE LIBRE 14 DAY READER) DEVI 1 Device by Does not apply route every morning. (Patient not taking: Reported on 08/09/2020) 1 each 3   furosemide (LASIX) 20 MG tablet Take 20 mg by mouth daily.     gabapentin (NEURONTIN) 400 MG capsule TAKE 1 CAPSULE BY MOUTH TWICE DAILY 60 capsule 3   Incontinence Supply Disposable (DEPEND ADJUSTABLE UNDERWEAR LG) MISC 1 application by Does not apply route as needed. 32 each 3   insulin glargine (LANTUS SOLOSTAR) 100 UNIT/ML Solostar Pen Inject 40 Units into the skin daily. (Patient taking differently: Inject 20 Units into the skin daily.) 15 mL 11   Insulin Pen Needle (BD PEN NEEDLE NANO U/F) 32G X 4 MM MISC AS DIRECTED TWICE DAILY 100 each 3   isosorbide mononitrate (IMDUR) 30 MG 24 hr tablet TAKE 1 TABLET(30 MG) BY MOUTH DAILY 90 tablet 1   Lancets (ONETOUCH ULTRASOFT) lancets USE AS DIRECTED 100 each 0   Multiple Vitamins-Minerals (MULTIVITAMIN GUMMIES ADULT) CHEW Chew 2 each by mouth daily.     nitroGLYCERIN (NITROSTAT) 0.4 MG SL tablet PLACE 1 TABLET UNDER THE TONGUE EVERY 5 MINS AS NEEDED FOR CHEST PAIN 25 tablet 3   ONETOUCH VERIO test strip USE TO TEST BLOOD SUGAR 2 TO 3 TIMES A DAY 100 strip 2   traMADol (ULTRAM) 50 MG tablet Take 1 tablet (50 mg total) by mouth every 8 (eight) hours as needed. 20 tablet 0   trimethoprim-polymyxin b (POLYTRIM) ophthalmic solution Place 1 drop into both eyes See admin instructions. 4 times a day for 2 days after eye injection (every 4 weeks)     TRULICITY 1.5 YP/9.5KD SOPN ADMINISTER 1.5 MG UNDER THE SKIN 1 TIME A WEEK (Patient taking differently: Inject 1.5 mg  into the skin every Friday.) 2 mL 5   No current facility-administered medications for this visit.     PHYSICAL EXAMINATION: ECOG PERFORMANCE STATUS: 0 - Asymptomatic  Vitals:   02/16/22 0959  BP: 129/76  Pulse: (!) 111  Resp: 18  Temp: 97.9 F (36.6 C)  SpO2: 99%   Filed Weights   02/16/22 0959  Weight: 186 lb 14.4 oz (84.8 kg)    Physical Exam Constitutional:      Appearance: Normal appearance.  Cardiovascular:     Rate and Rhythm: Normal rate and regular rhythm.     Pulses: Normal pulses.     Heart sounds: Normal heart sounds.  Abdominal:     Comments: Large mass in the right groin measuring about 6 cm in largest dimension.  Musculoskeletal:        General: No swelling.     Cervical back: Normal range of motion and neck supple. No rigidity.  Lymphadenopathy:     Cervical: No cervical adenopathy.  Neurological:     Mental Status: He is alert.     LABORATORY DATA:  I have  reviewed the data as listed Lab Results  Component Value Date   WBC 5.4 02/16/2022   HGB 12.6 (L) 02/16/2022   HCT 37.9 (L) 02/16/2022   MCV 82.4 02/16/2022   PLT 272 02/16/2022     Chemistry      Component Value Date/Time   NA 140 02/16/2022 1118   NA 143 08/13/2020 1442   K 5.2 (H) 02/16/2022 1118   CL 107 02/16/2022 1118   CO2 29 02/16/2022 1118   BUN 21 02/16/2022 1118   BUN 19 08/13/2020 1442   CREATININE 1.57 (H) 02/16/2022 1118   CREATININE 1.35 (H) 11/25/2015 1701      Component Value Date/Time   CALCIUM 10.0 02/16/2022 1118   ALKPHOS 109 02/16/2022 1118   AST 36 02/16/2022 1118   ALT 25 02/16/2022 1118   BILITOT 0.7 02/16/2022 1118       RADIOGRAPHIC STUDIES: I have personally reviewed the radiological images as listed and agreed with the findings in the report. [place] CORE BIOPSY (LYMPH NODES)  Result Date: 01/27/2022 INDICATION: 71 year old male referred for biopsy of right inguinal nodal mass. EXAM: ULTRASOUND-GUIDED BIOPSY RIGHT INGUINAL NODAL MASS MEDICATIONS:  None. ANESTHESIA/SEDATION: None COMPLICATIONS: None PROCEDURE: Informed written consent was obtained from the patient and his family after a thorough discussion of the procedural risks, benefits and alternatives. All questions were addressed. Maximal Sterile Barrier Technique was utilized including caps, mask, sterile gowns, sterile gloves, sterile drape, hand hygiene and skin antiseptic. A timeout was performed prior to the initiation of the procedure. Ultrasound survey was performed with images stored and sent to PACs. The right inguinal region was prepped with chlorhexidine in a sterile fashion, and a sterile drape was applied covering the operative field. A sterile gown and sterile gloves were used for the procedure. Local anesthesia was provided with 1% Lidocaine. Ultrasound guidance was used to infiltrate the region with 1% lidocaine for local anesthesia. Guide needle was advanced into the inguinal mass. Once we confirmed needle tip position, multiple 16 gauge core biopsy acquired in placed into fresh saline. After confirmation of adequate tissue, needle was removed and a final image was performed. Patient tolerated the procedure well and remained hemodynamically stable throughout. No complications were encountered and no significant blood loss was encounter IMPRESSION: Status post ultrasound-guided biopsy of right inguinal mass. Signed, Dulcy Fanny. Nadene Rubins, RPVI [facility] Radiology Electronically Signed   By: Corrie Mckusick D.O.   On: 01/27/2022 11:18    All questions were answered. The patient knows to call the clinic with any problems, questions or concerns. I spent 60 minutes in the care of this patient including H and P, review of records, counseling and coordination of care.     Benay Pike, MD 02/16/2022 1:25 PM

## 2022-02-20 ENCOUNTER — Encounter (HOSPITAL_COMMUNITY)
Admission: RE | Admit: 2022-02-20 | Discharge: 2022-02-20 | Disposition: A | Discharge status: Home / Self Care | Payer: Medicare (Managed Care) | Source: Ambulatory Visit | Attending: Hematology and Oncology | Admitting: Hematology and Oncology

## 2022-02-20 ENCOUNTER — Telehealth: Payer: Self-pay | Admitting: *Deleted

## 2022-02-20 DIAGNOSIS — C4A9 Merkel cell carcinoma, unspecified: Secondary | ICD-10-CM | POA: Insufficient documentation

## 2022-02-20 LAB — GLUCOSE, CAPILLARY: Glucose-Capillary: 72 mg/dL (ref 70–99)

## 2022-02-20 MED ORDER — FLUDEOXYGLUCOSE F - 18 (FDG) INJECTION
9.3000 | Freq: Once | INTRAVENOUS | Status: AC
  Administered 2022-02-20: 9.24 via INTRAVENOUS

## 2022-02-20 NOTE — Telephone Encounter (Signed)
Per MD request this RN called [facility] intake coordinator per need for Urgent consult for surgical excision of localized tumor.  Per Mya- requested data to be faxed for review for scheduling - fax number given as [phone]. Records faxed with   Scan result, MD dictation and demographics faxed to above with confirmation - Audelia Acton will have Dr Georgette Dover ( who did biopsy) review and contact this office.

## 2022-02-23 ENCOUNTER — Encounter: Payer: Self-pay | Admitting: Licensed Clinical Social Worker

## 2022-02-23 NOTE — Progress Notes (Signed)
[place] Work  Clinical Social Work was referred by new patient protocol for assessment of psychosocial needs.  Clinical Social Worker contacted caregiver by phone (daughterSharyn Lull) to offer support and assess for needs.   Per daughter, the most stress is from waiting on results to determine clear path forward with treatment. Daughter is main caregiver support and has understanding from work on needing to take time off for appointments. Pt is also enrolled with PACE program.   CSW informed daughter of the support team and support services at [facility].  CSW provided contact information and encouraged daughter/pt to call with any questions or concerns.     [place], [place] Worker [employer]

## 2022-02-26 ENCOUNTER — Encounter: Payer: Self-pay | Admitting: Hematology and Oncology

## 2022-02-26 ENCOUNTER — Inpatient Hospital Stay: Payer: Medicare (Managed Care)

## 2022-02-26 ENCOUNTER — Inpatient Hospital Stay: Payer: Medicare (Managed Care) | Attending: Hematology and Oncology | Admitting: Hematology and Oncology

## 2022-02-26 ENCOUNTER — Other Ambulatory Visit: Payer: Self-pay

## 2022-02-26 ENCOUNTER — Other Ambulatory Visit: Payer: Self-pay | Admitting: *Deleted

## 2022-02-26 VITALS — BP 70/50 | HR 106 | Temp 98.5°F

## 2022-02-26 DIAGNOSIS — Z794 Long term (current) use of insulin: Secondary | ICD-10-CM

## 2022-02-26 DIAGNOSIS — C4A9 Merkel cell carcinoma, unspecified: Secondary | ICD-10-CM | POA: Diagnosis not present

## 2022-02-26 DIAGNOSIS — Z79899 Other long term (current) drug therapy: Secondary | ICD-10-CM | POA: Diagnosis not present

## 2022-02-26 DIAGNOSIS — E1122 Type 2 diabetes mellitus with diabetic chronic kidney disease: Secondary | ICD-10-CM

## 2022-02-26 DIAGNOSIS — C7B1 Secondary Merkel cell carcinoma: Secondary | ICD-10-CM | POA: Diagnosis not present

## 2022-02-26 DIAGNOSIS — I959 Hypotension, unspecified: Secondary | ICD-10-CM | POA: Diagnosis not present

## 2022-02-26 DIAGNOSIS — C4A59 Merkel cell carcinoma of other part of trunk: Secondary | ICD-10-CM | POA: Insufficient documentation

## 2022-02-26 LAB — TSH: TSH: 3.56 u[IU]/mL (ref 0.350–4.500)

## 2022-02-26 MED ORDER — LIDOCAINE-PRILOCAINE 2.5-2.5 % EX CREA: TOPICAL_CREAM | CUTANEOUS | 3 refills | Status: DC

## 2022-02-26 MED ORDER — ONDANSETRON HCL 8 MG PO TABS: 8.0000 mg | ORAL_TABLET | Freq: Three times a day (TID) | ORAL | 1 refills | Status: DC | PRN

## 2022-02-26 MED ORDER — PROCHLORPERAZINE MALEATE 10 MG PO TABS: 10.0000 mg | ORAL_TABLET | Freq: Four times a day (QID) | ORAL | 1 refills | Status: DC | PRN

## 2022-02-26 NOTE — Progress Notes (Signed)
START OFF PATHWAY REGIMEN - Other   [id number]:Pembrolizumab 200 mg IV D1 q21 Days:   A cycle is every 21 days:     Pembrolizumab   **Always confirm dose/schedule in your pharmacy ordering system**  Patient Characteristics: Intent of Therapy: Non-Curative / Palliative Intent, Discussed with Patient 

## 2022-02-26 NOTE — Progress Notes (Signed)
[place] NOTE  Patient Care Team: [name], MD as PCP - General (Family Medicine) Jerline Pain, MD as PCP - Cardiology (Cardiology) [facility] 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   Since his last visit he had PET/CT which showed enlarged FDG avid nodal metastasis identified in the right common iliac, external iliac and right inguinal node chains.  No signs to suggest distant nodal metastasis or solid organ metastasis.  We have discussed about trying immunotherapy since it is likely unresectable at this time based on my discussion with Dr. Barry Dienes.  We have also talked about chemotherapy.  However given his performance status and his other comorbidities, we decided to try immunotherapy.  Today we have discussed about Keytruda, mechanism of action, adverse effects including but not limited to fatigue, diarrhea, hypo-/hyperthyroidism, rare and life-threatening side effects including pneumonitis, nephritis, hypophysitis.  We have discussed that immunotherapy can affect any organ and can be life-threatening and about 5 to 10% of the patients.  We have also discussed about chemotherapy including mechanism of action, adverse effects of chemotherapy.  At this time he and his daughter agree that immunotherapy is worth a try. He needs to be scheduled for chemotherapy education.  Treatment plan in place. Anticipate initiation of treatment next week.  Will try 2 cycles of immunotherapy and assess response to see if he can proceed with surgical resection.  I have clearly explained to  him that Merkel cell is an aggressive cancer and possibility of distant metastasis is very high despite complete response.  He tells me that he hopes and prays for the best. For pain control, he can continue tramadol twice a day as needed for pain. With regards to hypotension, we discussed that he will have to have better titration of his antihypertensive medications.  Daughter will call his PCP right away. I do not believe he is hypoglycemic today.  He had heavy breakfast this morning.  CBC, CMP and TSH at baseline needed.  Orders Placed This Encounter  Procedures   Consent Attestation for Oncology Treatment    Order Specific Question:   The patient is informed of risks, benefits, side-effects of the prescribed oncology treatment. Potential short term and long term side effects and response rates discussed. After a long discussion, the patient made informed decision to proceed.    Answer:   Yes   CBC with Differential (Cancer Center Only)    Standing Status:   Future    Standing Expiration Date:   03/06/2023   CMP ([place] only)    Standing Status:   Future    Standing Expiration Date:   03/06/2023   T4    Standing Status:   Future    Standing Expiration Date:   03/06/2023   TSH    Standing Status:   Future    Standing Expiration Date:   03/06/2023   PHYSICIAN COMMUNICATION ORDER    Required labs: Thyroid function tests at baseline and every 3rd cycle.   ONCBCN PHYSICIAN COMMUNICATION 1    Treat until Progression, Unacceptable Toxicity.    HISTORY OF PRESENTING ILLNESS:  Antonio Guerra 71 y.o. male is here because  of merkel cell carcinoma  This is a 70 year old male patient who was evaluated for right groin pain, questionable boil or hernia.  CT pelvis demonstrated 4.7 x 5.2 x 6.6 cm nodular lobulated mass at the right inguinal region recommend a needle biopsy.  Ultrasound-guided core biopsy of the right inguinal mass demonstrated poorly differentiated carcinoma with neuroendocrine  features suggestive of a Merkel cell carcinoma. PET CT 02/20/2022 Enlarged FDG avid nodal metastases identified within the right common iliac, right external iliac, and right inguinal nodal chains.There are no signs to suggest distant nodal metastasis or solid organ metastasis.  Patient is here for follow-up.  He however tells me that the right groin mass is continuing to increase.  He feels a bit sluggish today.  He had all his blood pressure medications.  He however has eaten a good breakfast, [place] toast and some vanilla shake as well as a banana.  He has been taking tramadol only once a day for pain control.  He denies any dizziness or lightheadedness.  He is with his daughter today to the appointment.  His speech is hard to comprehend.  Rest of the pertinent 10 point ROS reviewed and negative  MEDICAL HISTORY:  Past Medical History:  Diagnosis Date   Altered mental status    Arthritis    "all over" (12/20/2015)   Chronic lower back pain    Chronic systolic congestive heart failure ([place])    a. 04/2014 Echo: EF 20-25%.   Coronary artery disease    a. 2011 MI x 2 with PCI: stent x 2 (LAD and RI) @ [facility] in [place], [place];  b. 04/2014 Cath/PCI: LM 10-20, LAD 40p, 32m, 54m ISR(3.5x38 Xience DES), 50apical, LCX 20 diffuse, RI 50p, 78m ISR, RCA 40-65m, 30d.   Fibromyalgia    Hyperlipidemia    Hypertension    Hypertensive urgency 09/27/2015   Myocardial infarction [facility]) 2015   Stroke [facility]) 2012   "right hand weaker since" (12/20/2015)   Type II diabetes mellitus ([place])     SURGICAL HISTORY: Past Surgical History:  Procedure Laterality Date   ANKLE FRACTURE SURGERY Right    CARDIAC CATHETERIZATION N/A 09/30/2015   Procedure: Left Heart Cath and Coronary Angiography;  Surgeon: Burnell Blanks, MD;  Location: [place] CV LAB;  Service: Cardiovascular;  Laterality: N/A;   CORONARY ANGIOPLASTY WITH STENT PLACEMENT     "total of 3 stents" (12/20/2015)   FRACTIONAL FLOW RESERVE WIRE   05/11/2014   Procedure: FRACTIONAL FLOW RESERVE WIRE;  Surgeon: Burnell Blanks, MD;  Location[facility] CATH LAB;  Service: Cardiovascular;;   FRACTURE SURGERY     LEFT HEART CATHETERIZATION WITH CORONARY/GRAFT ANGIOGRAM N/A 05/11/2014   Procedure: LEFT HEART CATHETERIZATION WITH Beatrix Fetters;  Surgeon: Burnell Blanks, MD;  Location: [facility] CATH LAB;  Service: Cardiovascular;  Laterality: N/A;   PERCUTANEOUS CORONARY STENT INTERVENTION (PCI-S)  05/11/2014   Procedure: PERCUTANEOUS CORONARY STENT INTERVENTION (PCI-S);  Surgeon: Burnell Blanks, MD;  Location: [facility] CATH LAB;  Service: Cardiovascular;;    SOCIAL HISTORY: Social History   Socioeconomic History   Marital status: Married    Spouse name: Not on file   Number of children: Not on file   Years of education: Not on file   Highest education level: Not on file  Occupational History   Occupation: retired   Tobacco Use   Smoking status: Former   Smokeless tobacco: Never   Tobacco comments:    12/20/2015 "smoked cigarettes 30-64yr ago"  Vaping Use  Vaping Use: Never used  Substance and Sexual Activity   Alcohol use: Not Currently    Alcohol/week: 5.0 standard drinks of alcohol    Types: 5 Cans of beer per week    Comment: stopped 4 yrs ago    Drug use: No   Sexual activity: Not Currently    Partners: Female  Other Topics Concern   Not on file  Social History Narrative   Current Social History        Who lives at home: lives with daughter [name] 2021-03-22    Who would speak for you about health care matters: [name] 03-22-2021    Transportation: Has [employer] and daughter takes to appointments 03-22-2021   Current Stressors: wife and daughter passed away in 2020-10-25 03/22/21   Work / Education:  worked in the [place] and with [employer] 03-22-21   Other: limited mobility uses cane and walker and memory decline ( per daughter)  Mar 22, 2021                                                                                                        Social Determinants of Health   Financial Resource Strain: Low Risk  (03/11/2021)   Overall Financial Resource Strain (CARDIA)    Difficulty of Paying Living Expenses: Not hard at all  Food Insecurity: No Food Insecurity (03/11/2021)   Hunger Vital Sign    Worried About Running Out of Food in the Last Year: Never true    [place] in the Last Year: Never true  Transportation Needs: No Transportation Needs (03/11/2021)   PRAPARE - Hydrologist (Medical): No    Lack of Transportation (Non-Medical): No  Physical Activity: Inactive (03/11/2021)   Exercise Vital Sign    Days of Exercise per Week: 0 days    Minutes of Exercise per Session: 0 min  Stress: Stress Concern Present (03/11/2021)   [place]    Feeling of Stress : Rather much  Social Connections: Socially Isolated (03/11/2021)   Social Connection and Isolation Panel [NHANES]    Frequency of Communication with Friends and Family: Once a week    Frequency of Social Gatherings with Friends and Family: Once a week    Attends Religious Services: Never    Marine scientist or Organizations: No    Attends Archivist Meetings: Never    Marital Status: Widowed  Intimate Partner Violence: Not At Risk (03/11/2021)   Humiliation, Afraid, Rape, and Kick questionnaire    Fear of Current or Ex-Partner: No    Emotionally Abused: No    Physically Abused: No    Sexually Abused: No    FAMILY HISTORY: Family History  Problem Relation Age of Onset   Diabetes Mother    CAD Neg Hx    Stomach cancer Neg Hx    Colon cancer Neg Hx    Esophageal cancer Neg Hx    Pancreatic cancer Neg Hx     ALLERGIES:  is allergic to lisinopril  and tape.  MEDICATIONS:  Current Outpatient Medications  Medication Sig Dispense Refill   atorvastatin (LIPITOR) 80 MG tablet TAKE 1 TABLET(80 MG) BY MOUTH AT  BEDTIME 90 tablet 3   Blood Glucose Monitoring Suppl (ONETOUCH VERIO) w/Device KIT 30 Units by Does not apply route daily. 1 kit 0   carvedilol (COREG) 25 MG tablet TAKE 1 TABLET(25 MG) BY MOUTH TWICE DAILY 180 tablet 0   clopidogrel (PLAVIX) 75 MG tablet TAKE 1 TABLET(75 MG) BY MOUTH DAILY 90 tablet 2   Continuous Blood Gluc Receiver (FREESTYLE LIBRE 14 DAY READER) DEVI 1 Device by Does not apply route every morning. (Patient not taking: Reported on 08/09/2020) 1 each 3   furosemide (LASIX) 20 MG tablet Take 20 mg by mouth daily.     gabapentin (NEURONTIN) 400 MG capsule TAKE 1 CAPSULE BY MOUTH TWICE DAILY 60 capsule 3   Incontinence Supply Disposable (DEPEND ADJUSTABLE UNDERWEAR LG) MISC 1 application by Does not apply route as needed. 32 each 3   insulin glargine (LANTUS SOLOSTAR) 100 UNIT/ML Solostar Pen Inject 40 Units into the skin daily. (Patient taking differently: Inject 20 Units into the skin daily.) 15 mL 11   Insulin Pen Needle (BD PEN NEEDLE NANO U/F) 32G X 4 MM MISC AS DIRECTED TWICE DAILY 100 each 3   isosorbide mononitrate (IMDUR) 30 MG 24 hr tablet TAKE 1 TABLET(30 MG) BY MOUTH DAILY 90 tablet 1   Lancets (ONETOUCH ULTRASOFT) lancets USE AS DIRECTED 100 each 0   lidocaine-prilocaine (EMLA) cream Apply to affected area once 30 g 3   Multiple Vitamins-Minerals (MULTIVITAMIN GUMMIES ADULT) CHEW Chew 2 each by mouth daily.     nitroGLYCERIN (NITROSTAT) 0.4 MG SL tablet PLACE 1 TABLET UNDER THE TONGUE EVERY 5 MINS AS NEEDED FOR CHEST PAIN 25 tablet 3   ondansetron (ZOFRAN) 8 MG tablet Take 1 tablet (8 mg total) by mouth every 8 (eight) hours as needed for nausea or vomiting. 30 tablet 1   ONETOUCH VERIO test strip USE TO TEST BLOOD SUGAR 2 TO 3 TIMES A DAY 100 strip 2   prochlorperazine (COMPAZINE) 10 MG tablet Take 1 tablet (10 mg total) by mouth every 6 (six) hours as needed for nausea or vomiting. 30 tablet 1   traMADol (ULTRAM) 50 MG tablet Take 1 tablet (50 mg total) by mouth every  8 (eight) hours as needed. 20 tablet 0   trimethoprim-polymyxin b (POLYTRIM) ophthalmic solution Place 1 drop into both eyes See admin instructions. 4 times a day for 2 days after eye injection (every 4 weeks)     TRULICITY 1.5 RV/2.0EB SOPN ADMINISTER 1.5 MG UNDER THE SKIN 1 TIME A WEEK (Patient taking differently: Inject 1.5 mg into the skin every Friday.) 2 mL 5   No current facility-administered medications for this visit.     PHYSICAL EXAMINATION: ECOG PERFORMANCE STATUS: 0 - Asymptomatic  Vitals:   02/26/22 1032  BP: (!) 70/50  Pulse: (!) 106  Temp: 98.5 F (36.9 C)  SpO2: 100%   Filed Weights    Physical Exam Constitutional:      Appearance: He is ill-appearing (He appears chronically ill, no acute distress).  Cardiovascular:     Rate and Rhythm: Normal rate and regular rhythm.     Pulses: Normal pulses.     Heart sounds: Normal heart sounds.  Abdominal:     Comments: Large mass in the right groin appears to have increased since last visit  Musculoskeletal:  General: No swelling.     Cervical back: Normal range of motion and neck supple. No rigidity.  Lymphadenopathy:     Cervical: No cervical adenopathy.  Neurological:     Mental Status: He is alert.     LABORATORY DATA:  I have reviewed the data as listed Lab Results  Component Value Date   WBC 5.4 02/16/2022   HGB 12.6 (L) 02/16/2022   HCT 37.9 (L) 02/16/2022   MCV 82.4 02/16/2022   PLT 272 02/16/2022     Chemistry      Component Value Date/Time   NA 140 02/16/2022 1118   NA 143 08/13/2020 1442   K 5.2 (H) 02/16/2022 1118   CL 107 02/16/2022 1118   CO2 29 02/16/2022 1118   BUN 21 02/16/2022 1118   BUN 19 08/13/2020 1442   CREATININE 1.57 (H) 02/16/2022 1118   CREATININE 1.35 (H) 11/25/2015 1701      Component Value Date/Time   CALCIUM 10.0 02/16/2022 1118   ALKPHOS 109 02/16/2022 1118   AST 36 02/16/2022 1118   ALT 25 02/16/2022 1118   BILITOT 0.7 02/16/2022 1118        RADIOGRAPHIC STUDIES: I have personally reviewed the radiological images as listed and agreed with the findings in the report. NM PET Image Restag (PS) Skull Base To Thigh  Result Date: 02/20/2022 CLINICAL DATA:  Initial treatment strategy for Merkel cell carcinoma. EXAM: NUCLEAR MEDICINE PET SKULL BASE TO THIGH TECHNIQUE: 9.24 mCi F-18 FDG was injected intravenously. Full-ring PET imaging was performed from the skull base to thigh after the radiotracer. CT data was obtained and used for attenuation correction and anatomic localization. Fasting blood glucose: 72 mg/dl COMPARISON:  CT pelvis 12/23/2021 FINDINGS: Mediastinal blood pool activity: SUV max 2.69 Liver activity: SUV max NA NECK: No tracer avid mass or adenopathy identified. Incidental CT findings: none CHEST: There is no tracer avid axillary, supraclavicular, mediastinal, or hilar lymph nodes. No tracer avid pulmonary nodule. Incidental CT findings: Aortic atherosclerosis and coronary artery calcifications. Small hiatal hernia noted. ABDOMEN/PELVIS: There is no abnormal tracer uptake identified within the liver, pancreas, spleen, or adrenal glands. No tracer avid lymph nodes within the abdomen. Multiple tracer avid right iliac and right inguinal lymph nodes are identified. This includes: Two small right common iliac lymph nodes measuring 1.5 cm with SUV max of 4.96, image 164/4. Just below the bifurcation of the right common iliac artery is a 1.1 cm lymph node with SUV max of 1.0, image 176/4. Cluster of lymph nodes within the right external iliac chain measures 2.2 x 3.1 cm within SUV max of 10.18, image 184/4. Nodal mass within the right inguinal region measures 7.6 x 6.8 cm with SUV max of 22.08, image 204/4. Focal area of nonspecific increased uptake within the proximal rectum at the 5 o'clock orientation has an SUV max 13.15 (which is above background bowel activity). On the corresponding CT images there is a mild area of asymmetric  increased wall thickening measuring 1 cm, image 191/4. This is nonspecific. Mild increased uptake within the prostate gland is noted which has an SUV max of 6.02. Incidental CT findings: Aortic atherosclerosis. Calcifications identified within the pancreas which may be the sequelae of chronic pancreatitis. Punctate bilateral renal calcifications noted. No hydronephrosis. SKELETON: No focal hypermetabolic activity to suggest skeletal metastasis. There is a small exophytic soft tissue density lesion which arises off the left inguinal region which was present on the study from 12/23/2021. This measures approximately 1.3 cm and  has mild FDG uptake with SUV max of 1.96. Incidental CT findings: none IMPRESSION: 1. Enlarged FDG avid nodal metastases identified within the right common iliac, right external iliac, and right inguinal nodal chains. 2. There are no signs to suggest distant nodal metastasis or solid organ metastasis. 3. Nonspecific increased uptake with corresponding mild wall thickening is noted involving the proximal rectum. Correlation with colon cancer screening is advised. 4. There is also nonspecific increased uptake within the prostate gland. Consider further evaluation with PSA. 5. Indeterminate exophytic soft tissue nodule arising off the skin surface within the left inguinal region with mild nonspecific FDG uptake. Correlation with physical exam findings advised. 6. Aortic Atherosclerosis (ICD10-I70.0). Coronary artery calcifications. 7. Small hiatal hernia. 8. Calcifications identified within the pancreas which may be the sequelae of chronic pancreatitis. Electronically Signed   By: Kerby Moors M.D.   On: 02/20/2022 10:09    All questions were answered. The patient knows to call the clinic with any problems, questions or concerns. I spent 40 minutes in the care of this patient including H and P, review of records, counseling and coordination of care.     Benay Pike, MD 02/26/2022 11:40  AM

## 2022-02-26 NOTE — Assessment & Plan Note (Signed)
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   Since his last visit he had PET/CT which showed enlarged FDG avid nodal metastasis identified in the right common iliac, external iliac and right inguinal node chains.  No signs to suggest distant nodal metastasis or solid organ metastasis.  We have discussed about trying immunotherapy since it is likely unresectable at this time based on my discussion with Dr. Barry Dienes.  We have also talked about chemotherapy.  However given his performance status and his other comorbidities, we decided to try immunotherapy.  Today we have discussed about Keytruda, mechanism of action, adverse effects including but not limited to fatigue, diarrhea, hypo-/hyperthyroidism, rare and life-threatening side effects including pneumonitis, nephritis, hypophysitis.  We have discussed that immunotherapy can affect any organ and can be life-threatening and about 5 to 10% of the patients.  We have also discussed about chemotherapy including mechanism of action, adverse effects of chemotherapy.  At this time he and his daughter agree that immunotherapy is worth a try. He needs to be scheduled for chemotherapy education.  Treatment plan in place. Anticipate initiation of treatment next week.  Will try 2 cycles of immunotherapy and assess response to see if he can proceed with surgical resection.  I have clearly explained to him that Merkel cell is an aggressive cancer and possibility of distant metastasis is very high despite complete response.  He tells me that he hopes and prays for the best. For pain control, he can continue tramadol twice a day as needed for pain. With regards to hypotension, we discussed that he will have to have better titration of his antihypertensive  medications.  Daughter will call his PCP right away. I do not believe he is hypoglycemic today.  He had heavy breakfast this morning.  CBC, CMP and TSH at baseline needed.

## 2022-02-27 ENCOUNTER — Telehealth: Payer: Self-pay | Admitting: *Deleted

## 2022-02-27 ENCOUNTER — Other Ambulatory Visit: Payer: Self-pay

## 2022-02-27 DIAGNOSIS — C4A9 Merkel cell carcinoma, unspecified: Secondary | ICD-10-CM | POA: Insufficient documentation

## 2022-02-27 NOTE — Telephone Encounter (Signed)
   Name: Antonio Guerra  DOB: 08/14/1950  MRN: [id number]  Primary Cardiologist: Candee Furbish, MD  Chart reviewed as part of pre-operative protocol coverage. Because of [name] past medical history and time since last visit, he will require a follow-up in-office visit in order to better assess preoperative cardiovascular risk.  Pre-op covering staff: - Please schedule appointment and call patient to inform them. If patient already had an upcoming appointment within acceptable timeframe, please add "pre-op clearance" to the appointment notes so provider is aware. - Please contact requesting surgeon's office via preferred method (i.e, phone, fax) to inform them of need for appointment prior to surgery.  With history of remote stenting of of the LAD, RI back in 2011, will send to Dr. Marlou Porch to weigh in on holding Plavix. Patient has not been seen since 2021.  [name], PA-C  02/27/2022, 5:02 PM

## 2022-02-27 NOTE — Telephone Encounter (Signed)
   Pre-operative Risk Assessment    Patient Name: Antonio Guerra  DOB: November 14, 1950 MRN: [id number]      Request for Surgical Clearance    Procedure:   INGUINAL LYMPH NODE DISSECTION ; SURGERY WILL PLANNED FOR 2.5 HOURS   Date of Surgery:  Clearance TBD                                 Surgeon:  DR. Stark Klein Surgeon's Group or Practice Name:  [facility] Phone number:  [phone] Fax number:  [phone] ATTN: Emeline Gins, CMA   Type of Clearance Requested:   - Medical  - Pharmacy:  Hold Clopidogrel (Plavix)     Type of Anesthesia:  General    Additional requests/questions:    Jiles Prows   02/27/2022, 4:02 PM

## 2022-03-02 NOTE — Telephone Encounter (Signed)
DPR ok to s/w the pt's daughter [name] who is agreeable to plan of care for in office appt. [name] tells me that he dad will be having IV Tx x 2 for the tumor before surgery will be done. Pt's daughter is grateful for the appt and thanked me for the help.

## 2022-03-03 ENCOUNTER — Ambulatory Visit: Payer: Medicare (Managed Care) | Admitting: Oncology

## 2022-03-04 ENCOUNTER — Encounter (INDEPENDENT_AMBULATORY_CARE_PROVIDER_SITE_OTHER): Payer: Medicare (Managed Care) | Admitting: Ophthalmology

## 2022-03-04 ENCOUNTER — Other Ambulatory Visit: Payer: Self-pay

## 2022-03-04 DIAGNOSIS — I10 Essential (primary) hypertension: Secondary | ICD-10-CM

## 2022-03-04 DIAGNOSIS — E113513 Type 2 diabetes mellitus with proliferative diabetic retinopathy with macular edema, bilateral: Secondary | ICD-10-CM | POA: Diagnosis not present

## 2022-03-04 DIAGNOSIS — H35033 Hypertensive retinopathy, bilateral: Secondary | ICD-10-CM

## 2022-03-04 DIAGNOSIS — H43813 Vitreous degeneration, bilateral: Secondary | ICD-10-CM

## 2022-03-05 ENCOUNTER — Other Ambulatory Visit: Payer: Self-pay | Admitting: *Deleted

## 2022-03-05 ENCOUNTER — Inpatient Hospital Stay: Payer: Medicare (Managed Care)

## 2022-03-05 ENCOUNTER — Ambulatory Visit (HOSPITAL_COMMUNITY)
Admission: RE | Admit: 2022-03-05 | Discharge: 2022-03-05 | Disposition: A | Discharge status: Home / Self Care | Payer: Medicare (Managed Care) | Source: Ambulatory Visit | Attending: Hematology and Oncology | Admitting: Hematology and Oncology

## 2022-03-05 ENCOUNTER — Other Ambulatory Visit: Payer: Self-pay | Admitting: Hematology and Oncology

## 2022-03-05 ENCOUNTER — Other Ambulatory Visit: Payer: Self-pay

## 2022-03-05 VITALS — BP 124/73 | HR 107 | Temp 98.6°F | Resp 18

## 2022-03-05 DIAGNOSIS — C4A9 Merkel cell carcinoma, unspecified: Secondary | ICD-10-CM

## 2022-03-05 DIAGNOSIS — Z79620 Long term (current) use of immunosuppressive biologic: Secondary | ICD-10-CM | POA: Insufficient documentation

## 2022-03-05 DIAGNOSIS — N179 Acute kidney failure, unspecified: Secondary | ICD-10-CM | POA: Insufficient documentation

## 2022-03-05 DIAGNOSIS — R7989 Other specified abnormal findings of blood chemistry: Secondary | ICD-10-CM

## 2022-03-05 LAB — CBC WITH DIFFERENTIAL (CANCER CENTER ONLY)
Abs Immature Granulocytes: 0.02 10*3/uL (ref 0.00–0.07)
Basophils Absolute: 0 10*3/uL (ref 0.0–0.1)
Basophils Relative: 0 %
Eosinophils Absolute: 0.1 10*3/uL (ref 0.0–0.5)
Eosinophils Relative: 1 %
HCT: 32.8 % — ABNORMAL LOW (ref 39.0–52.0)
Hemoglobin: 11.4 g/dL — ABNORMAL LOW (ref 13.0–17.0)
Immature Granulocytes: 0 %
Lymphocytes Relative: 25 %
Lymphs Abs: 1.5 10*3/uL (ref 0.7–4.0)
MCH: 27.9 pg (ref 26.0–34.0)
MCHC: 34.8 g/dL (ref 30.0–36.0)
MCV: 80.2 fL (ref 80.0–100.0)
Monocytes Absolute: 0.8 10*3/uL (ref 0.1–1.0)
Monocytes Relative: 13 %
Neutro Abs: 3.8 10*3/uL (ref 1.7–7.7)
Neutrophils Relative %: 61 %
Platelet Count: 240 10*3/uL (ref 150–400)
RBC: 4.09 MIL/uL — ABNORMAL LOW (ref 4.22–5.81)
RDW: 14 % (ref 11.5–15.5)
WBC Count: 6.2 10*3/uL (ref 4.0–10.5)
nRBC: 0 % (ref 0.0–0.2)

## 2022-03-05 LAB — CMP (CANCER CENTER ONLY)
ALT: 26 U/L (ref 0–44)
AST: 39 U/L (ref 15–41)
Albumin: 3.8 g/dL (ref 3.5–5.0)
Alkaline Phosphatase: 102 U/L (ref 38–126)
Anion gap: 9 (ref 5–15)
BUN: 39 mg/dL — ABNORMAL HIGH (ref 8–23)
CO2: 21 mmol/L — ABNORMAL LOW (ref 22–32)
Calcium: 9.1 mg/dL (ref 8.9–10.3)
Chloride: 107 mmol/L (ref 98–111)
Creatinine: 1.97 mg/dL — ABNORMAL HIGH (ref 0.61–1.24)
GFR, Estimated: 36 mL/min — ABNORMAL LOW (ref >60)
Glucose, Bld: 170 mg/dL — ABNORMAL HIGH (ref 70–99)
Potassium: 4.5 mmol/L (ref 3.5–5.1)
Sodium: 137 mmol/L (ref 135–145)
Total Bilirubin: 0.9 mg/dL (ref 0.3–1.2)
Total Protein: 8.2 g/dL — ABNORMAL HIGH (ref 6.5–8.1)

## 2022-03-05 LAB — TSH: TSH: 4.075 u[IU]/mL (ref 0.350–4.500)

## 2022-03-05 MED ORDER — SODIUM CHLORIDE 0.9 % IV SOLN
200.0000 mg | Freq: Once | INTRAVENOUS | Status: AC
  Administered 2022-03-05: 200 mg via INTRAVENOUS
  Filled 2022-03-05: qty 200

## 2022-03-05 MED ORDER — TRAMADOL HCL 50 MG PO TABS: 50.0000 mg | ORAL_TABLET | Freq: Once | ORAL | 0 refills | Status: DC

## 2022-03-05 MED ORDER — TRAMADOL HCL 50 MG PO TABS
50.0000 mg | ORAL_TABLET | Freq: Once | ORAL | Status: AC
  Administered 2022-03-05: 50 mg via ORAL
  Filled 2022-03-05: qty 1

## 2022-03-05 MED ORDER — SODIUM CHLORIDE 0.9 % IV SOLN: Freq: Once | INTRAVENOUS | Status: AC

## 2022-03-05 NOTE — Progress Notes (Signed)
US renal ordered STAT given worsening AKI, to rule out hydronephrosis.

## 2022-03-05 NOTE — Patient Instructions (Signed)
[facility]  Discharge Instructions: Thank you for choosing [facility] to provide your oncology and hematology care.   If you have a lab appointment with the [facility], please go directly to the [facility] and check in at the registration area.   Wear comfortable clothing and clothing appropriate for easy access to any Portacath or PICC line.   We strive to give you quality time with your provider. You may need to reschedule your appointment if you arrive late (15 or more minutes).  Arriving late affects you and other patients whose appointments are after yours.  Also, if you miss three or more appointments without notifying the office, you may be dismissed from the clinic at the provider's discretion.      For prescription refill requests, have your pharmacy contact our office and allow 72 hours for refills to be completed.    Today you received the following chemotherapy and/or immunotherapy agents : Keytruda      To help prevent nausea and vomiting after your treatment, we encourage you to take your nausea medication as directed.  BELOW ARE SYMPTOMS THAT SHOULD BE REPORTED IMMEDIATELY: *FEVER GREATER THAN 100.4 F (38 C) OR HIGHER *CHILLS OR SWEATING *NAUSEA AND VOMITING THAT IS NOT CONTROLLED WITH YOUR NAUSEA MEDICATION *UNUSUAL SHORTNESS OF BREATH *UNUSUAL BRUISING OR BLEEDING *URINARY PROBLEMS (pain or burning when urinating, or frequent urination) *BOWEL PROBLEMS (unusual diarrhea, constipation, pain near the anus) TENDERNESS IN MOUTH AND THROAT WITH OR WITHOUT PRESENCE OF ULCERS (sore throat, sores in mouth, or a toothache) UNUSUAL RASH, SWELLING OR PAIN  UNUSUAL VAGINAL DISCHARGE OR ITCHING   Items with * indicate a potential emergency and should be followed up as soon as possible or go to the Emergency Department if any problems should occur.  Please show the CHEMOTHERAPY ALERT CARD or IMMUNOTHERAPY ALERT CARD at check-in to  the Emergency Department and triage nurse.  Should you have questions after your visit or need to cancel or reschedule your appointment, please contact [facility]  Dept: [phone]  and follow the prompts.  Office hours are 8:00 a.m. to 4:30 p.m. Monday - Friday. Please note that voicemails left after 4:00 p.m. may not be returned until the following business day.  We are closed weekends and major holidays. You have access to a nurse at all times for urgent questions. Please call the main number to the clinic Dept: [phone] and follow the prompts.   For any non-urgent questions, you may also contact your provider using MyChart. We now offer e-Visits for anyone 6 and older to request care online for non-urgent symptoms. For details visit [email or web address].   Also download the MyChart app! Go to the app store, search "MyChart", open the app, select [facility], and log in with your MyChart username and password.  Masks are optional in the cancer centers. If you would like for your care team to wear a mask while they are taking care of you, please let them know. You may have one support person who is at least 70 years old accompany you for your appointments. Pembrolizumab Injection What is this medication? PEMBROLIZUMAB (PEM broe LIZ ue mab) treats some types of cancer. It works by helping your immune system slow or stop the spread of cancer cells. It is a monoclonal antibody. This medicine may be used for other purposes; ask your health care provider or pharmacist if you have questions. COMMON BRAND NAME(S): Keytruda What  should I tell my care team before I take this medication? They need to know if you have any of these conditions: Allogeneic stem cell transplant (uses someone else's stem cells) Autoimmune diseases, such as Crohn disease, ulcerative colitis, lupus History of chest radiation Nervous system problems, such as Guillain-Barre syndrome,  myasthenia gravis Organ transplant An unusual or allergic reaction to pembrolizumab, other medications, foods, dyes, or preservatives Pregnant or trying to get pregnant Breast-feeding How should I use this medication? This medication is injected into a vein. It is given by your care team in a hospital or clinic setting. A special MedGuide will be given to you before each treatment. Be sure to read this information carefully each time. Talk to your care team about the use of this medication in children. While it may be prescribed for children as young as 6 months for selected conditions, precautions do apply. Overdosage: If you think you have taken too much of this medicine contact a poison control center or emergency room at once. NOTE: This medicine is only for you. Do not share this medicine with others. What if I miss a dose? Keep appointments for follow-up doses. It is important not to miss your dose. Call your care team if you are unable to keep an appointment. What may interact with this medication? Interactions have not been studied. This list may not describe all possible interactions. Give your health care provider a list of all the medicines, herbs, non-prescription drugs, or dietary supplements you use. Also tell them if you smoke, drink alcohol, or use illegal drugs. Some items may interact with your medicine. What should I watch for while using this medication? Your condition will be monitored carefully while you are receiving this medication. You may need blood work while taking this medication. This medication may cause serious skin reactions. They can happen weeks to months after starting the medication. Contact your care team right away if you notice fevers or flu-like symptoms with a rash. The rash may be red or purple and then turn into blisters or peeling of the skin. You may also notice a red rash with swelling of the face, lips, or lymph nodes in your neck or under your  arms. Tell your care team right away if you have any change in your eyesight. Talk to your care team if you may be pregnant. Serious birth defects can occur if you take this medication during pregnancy and for 4 months after the last dose. You will need a negative pregnancy test before starting this medication. Contraception is recommended while taking this medication and for 4 months after the last dose. Your care team can help you find the option that works for you. Do not breastfeed while taking this medication and for 4 months after the last dose. What side effects may I notice from receiving this medication? Side effects that you should report to your care team as soon as possible: Allergic reactions--skin rash, itching, hives, swelling of the face, lips, tongue, or throat Dry cough, shortness of breath or trouble breathing Eye pain, redness, irritation, or discharge with blurry or decreased vision Heart muscle inflammation--unusual weakness or fatigue, shortness of breath, chest pain, fast or irregular heartbeat, dizziness, swelling of the ankles, feet, or hands Hormone gland problems--headache, sensitivity to light, unusual weakness or fatigue, dizziness, fast or irregular heartbeat, increased sensitivity to cold or heat, excessive sweating, constipation, hair loss, increased thirst or amount of urine, tremors or shaking, irritability Infusion reactions--chest pain, shortness of breath   or trouble breathing, feeling faint or lightheaded Kidney injury (glomerulonephritis)--decrease in the amount of urine, red or dark brown urine, foamy or bubbly urine, swelling of the ankles, hands, or feet Liver injury--right upper belly pain, loss of appetite, nausea, light-colored stool, dark yellow or brown urine, yellowing skin or eyes, unusual weakness or fatigue Pain, tingling, or numbness in the hands or feet, muscle weakness, change in vision, confusion or trouble speaking, loss of balance or  coordination, trouble walking, seizures Rash, fever, and swollen lymph nodes Redness, blistering, peeling, or loosening of the skin, including inside the mouth Sudden or severe stomach pain, bloody diarrhea, fever, nausea, vomiting Side effects that usually do not require medical attention (report to your care team if they continue or are bothersome): Bone, joint, or muscle pain Diarrhea Fatigue Loss of appetite Nausea Skin rash This list may not describe all possible side effects. Call your doctor for medical advice about side effects. You may report side effects to FDA at 1-800-FDA-1088. Where should I keep my medication? This medication is given in a hospital or clinic. It will not be stored at home. NOTE: This sheet is a summary. It may not cover all possible information. If you have questions about this medicine, talk to your doctor, pharmacist, or health care provider.  2023 Elsevier/Gold Standard (2021-10-27 00:00:00)

## 2022-03-06 LAB — T4: T4, Total: 6.5 ug/dL (ref 4.5–12.0)

## 2022-03-11 ENCOUNTER — Other Ambulatory Visit: Payer: Self-pay

## 2022-03-13 ENCOUNTER — Other Ambulatory Visit: Payer: Self-pay

## 2022-03-16 ENCOUNTER — Telehealth: Payer: Self-pay | Admitting: *Deleted

## 2022-03-16 NOTE — Telephone Encounter (Signed)
This RN spoke with [place] NP with [facility] - stating concern due to pt has increased pain in groin at area of known lymph node involved with the [facility] Cell cancer.  Pt is increasing as well as area is noted as red and warm to the touch.  She is requesting an appt for evaluation- she will do lab work today and " see if he needs an antibiotic."  Note pt has had 1 dose of keytuda on 8/17 with next dose dose due 9/8.  Appt made for tomorrow due to acuity of concern

## 2022-03-17 ENCOUNTER — Encounter: Payer: Self-pay | Admitting: Hematology and Oncology

## 2022-03-17 ENCOUNTER — Other Ambulatory Visit: Payer: Self-pay

## 2022-03-17 ENCOUNTER — Inpatient Hospital Stay (HOSPITAL_BASED_OUTPATIENT_CLINIC_OR_DEPARTMENT_OTHER): Payer: Medicare (Managed Care) | Admitting: Hematology and Oncology

## 2022-03-17 VITALS — BP 118/75 | HR 104 | Temp 97.7°F | Resp 16 | Ht 73.0 in | Wt 189.3 lb

## 2022-03-17 DIAGNOSIS — C7B1 Secondary Merkel cell carcinoma: Secondary | ICD-10-CM | POA: Diagnosis not present

## 2022-03-17 DIAGNOSIS — C4A9 Merkel cell carcinoma, unspecified: Secondary | ICD-10-CM

## 2022-03-17 DIAGNOSIS — C4A59 Merkel cell carcinoma of other part of trunk: Secondary | ICD-10-CM | POA: Diagnosis not present

## 2022-03-17 NOTE — Assessment & Plan Note (Addendum)
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   Since his last visit he had PET/CT which showed enlarged FDG avid nodal metastasis identified in the right common iliac, external iliac and right inguinal node chains.  No signs to suggest distant nodal metastasis or solid organ metastasis.  We have discussed about trying immunotherapy since it is likely unresectable at this time based on my discussion with Dr. Barry Dienes. He started cycle 1 of neoadjuvant Keytruda on August 17 and is here for interim visit since we received a phone call about concern for infection in the right inguinal groin mass region. He arrived 30 min late to his appointment today.  He tolerated Keytruda well.  Right groin examination shows slightly larger mass compared to his last visit with some focal areas of patchy redness but no concern for underlying purulence or overt infection.  He otherwise does not have any evidence of systemic infection.  I have spoken to the nurse practitioner, they ran some labs yesterday which shows no evidence of leukocytosis.  At this time, I am not worried about an infection.  He will proceed with cycle 2 of immunotherapy as planned.  We have however encouraged him and his medical team to call us if he develops any signs of systemic infection and they have expressed understanding.

## 2022-03-17 NOTE — Progress Notes (Signed)
[place] NOTE  Patient Care Team: [name], MD as PCP - General (Family Medicine) Jerline Pain, MD as PCP - Cardiology (Cardiology) [facility] 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   Since his last visit he had PET/CT which showed enlarged FDG avid nodal metastasis identified in the right common iliac, external iliac and right inguinal node chains.  No signs to suggest distant nodal metastasis or solid organ metastasis.  We have discussed about trying immunotherapy since it is likely unresectable at this time based on my discussion with Dr. Barry Dienes. He started cycle 1 of neoadjuvant Keytruda on August 17 and is here for interim visit since we received a phone call about concern for infection in the right inguinal groin mass region. He arrived 30 min late to his appointment today.  He tolerated Keytruda well.  Right groin examination shows slightly larger mass compared to his last visit with some focal areas of patchy redness but no concern for underlying purulence or overt infection.  He otherwise does not have any evidence of systemic infection.  I have spoken to the nurse practitioner, they ran some labs yesterday which shows no evidence of leukocytosis.  At this time, I am not worried about an infection.  He will proceed with cycle 2 of immunotherapy as planned.  We have however encouraged him and his medical team to call us if he develops any signs of systemic infection and they have expressed understanding.  Orders Placed This Encounter  Procedures   CBC with  Differential ([place] Only)    Standing Status:   Future    Standing Expiration Date:   03/27/2023   CMP (Cancer Center only)    Standing Status:   Future    Standing Expiration Date:   03/27/2023    HISTORY OF PRESENTING ILLNESS:  Antonio Guerra 71 y.o. male is here because of merkel cell carcinoma  This is a 71 year old male patient who was evaluated for right groin pain, questionable boil or hernia.  CT pelvis demonstrated 4.7 x 5.2 x 6.6 cm nodular lobulated mass at the right inguinal region recommend a needle biopsy.  Ultrasound-guided core biopsy of the right inguinal mass demonstrated poorly differentiated carcinoma with neuroendocrine features suggestive of a Merkel cell carcinoma. PET CT 02/20/2022 Enlarged FDG avid nodal metastases identified within the right common iliac, right external iliac, and right inguinal nodal chains.There are no signs to suggest distant nodal metastasis or solid organ metastasis. Since he had locally advanced disease, we discussed about neoadjuvant immunotherapy based on some small group institutional experiences He started cycle 1 of immunotherapy on 03/05/2022.  He is here for an interim, unplanned visit because of some concern for infection in the inguinal mass region.   Interval history He had a visit yesterday with Ms. Marye Round nurse practitioner and she was worried if he has an infection in the right groin.  He tells me that the redness is slightly better compared to yesterday.  He denies any fevers or chills.  His pain is reasonably well controlled.  He continues to take pain medication once a day.  No side effects with immunotherapy so far.  He has noticed 1 loose stool a day but no overt diarrhea.  No change in breathing.  No new bone pains. Rest of the pertinent 10 point ROS reviewed and negative  MEDICAL HISTORY:  Past Medical History:  Diagnosis Date   Altered mental status    Arthritis    "all over" (12/20/2015)   Chronic lower back pain     Chronic systolic congestive heart failure ([place])    a. 04/2014 Echo: EF 20-25%.   Coronary artery disease    a. October 19, 2009 MI x 2 with PCI: stent x 2 (LAD and RI) @ [facility] in [place], [place];  b. 04/2014 Cath/PCI: LM 10-20, LAD 40p, 41m, 52m ISR(3.5x38 Xience DES), 50apical, LCX 20 diffuse, RI 50p, 77m ISR, RCA 40-58m, 30d.   Fibromyalgia    Hyperlipidemia    Hypertension    Hypertensive urgency 09/27/2015   Myocardial infarction [facility]) 2013-10-19   Stroke [facility]) 10/20/10   "right hand weaker since" (12/20/2015)   Type II diabetes mellitus ([place])     SURGICAL HISTORY: Past Surgical History:  Procedure Laterality Date   ANKLE FRACTURE SURGERY Right    CARDIAC CATHETERIZATION N/A 09/30/2015   Procedure: Left Heart Cath and Coronary Angiography;  Surgeon: Burnell Blanks, MD;  Location: [place] CV LAB;  Service: Cardiovascular;  Laterality: N/A;   CORONARY ANGIOPLASTY WITH STENT PLACEMENT     "total of 3 stents" (12/20/2015)   FRACTIONAL FLOW RESERVE WIRE  05/11/2014   Procedure: FRACTIONAL FLOW RESERVE WIRE;  Surgeon: Burnell Blanks, MD;  Location[facility] CATH LAB;  Service: Cardiovascular;;   FRACTURE SURGERY     LEFT HEART CATHETERIZATION WITH CORONARY/GRAFT ANGIOGRAM N/A 05/11/2014   Procedure: LEFT HEART CATHETERIZATION WITH Beatrix Fetters;  Surgeon: Burnell Blanks, MD;  Location: [facility] CATH LAB;  Service: Cardiovascular;  Laterality: N/A;   PERCUTANEOUS CORONARY STENT INTERVENTION (PCI-S)  05/11/2014   Procedure: PERCUTANEOUS CORONARY STENT INTERVENTION (PCI-S);  Surgeon: Burnell Blanks, MD;  Location: [facility] CATH LAB;  Service: Cardiovascular;;    SOCIAL HISTORY: Social History   Socioeconomic History   Marital status: Married    Spouse name: Not on file   Number of children: Not on file   Years of education: Not on file   Highest education level: Not on file  Occupational History   Occupation: retired   Tobacco Use   Smoking status: Former   Smokeless  tobacco: Never   Tobacco comments:    12/20/2015 "smoked cigarettes 30-71yr ago"  Vaping Use   Vaping Use: Never used  Substance and Sexual Activity   Alcohol use: Not Currently    Alcohol/week: 5.0 standard drinks of alcohol    Types: 5 Cans of beer per week    Comment: stopped 4 yrs ago    Drug use: No   Sexual activity: Not Currently    Partners: Female  Other Topics Concern   Not on file  Social History Narrative   Current Social History        Who lives at home: lives with daughter [name] Mar 16, 2021    Who would speak for you about health care matters: [name] 03/16/21    Transportation: Has [employer] and daughter takes to appointments March 16, 2021   Current Stressors: wife and daughter passed away in 2020/10/19 03/16/21   Work / Education:  worked in the [employer] and with [employer] 2021/03/16   Other: limited mobility uses cane and walker and memory decline ( per daughter)  03/16/2021  Social Determinants of Health   Financial Resource Strain: Low Risk  (03/11/2021)   Overall Financial Resource Strain (CARDIA)    Difficulty of Paying Living Expenses: Not hard at all  Food Insecurity: No Food Insecurity (03/11/2021)   Hunger Vital Sign    Worried About Running Out of Food in the Last Year: Never true    Ran Out of Food in the Last Year: Never true  Transportation Needs: No Transportation Needs (03/11/2021)   PRAPARE - Hydrologist (Medical): No    Lack of Transportation (Non-Medical): No  Physical Activity: Inactive (03/11/2021)   Exercise Vital Sign    Days of Exercise per Week: 0 days    Minutes of Exercise per Session: 0 min  Stress: Stress Concern Present (03/11/2021)   [place]    Feeling of Stress : Rather much  Social Connections: Socially Isolated (03/11/2021)   Social  Connection and Isolation Panel [NHANES]    Frequency of Communication with Friends and Family: Once a week    Frequency of Social Gatherings with Friends and Family: Once a week    Attends Religious Services: Never    Marine scientist or Organizations: No    Attends Archivist Meetings: Never    Marital Status: Widowed  Intimate Partner Violence: Not At Risk (03/11/2021)   Humiliation, Afraid, Rape, and Kick questionnaire    Fear of Current or Ex-Partner: No    Emotionally Abused: No    Physically Abused: No    Sexually Abused: No    FAMILY HISTORY: Family History  Problem Relation Age of Onset   Diabetes Mother    CAD Neg Hx    Stomach cancer Neg Hx    Colon cancer Neg Hx    Esophageal cancer Neg Hx    Pancreatic cancer Neg Hx     ALLERGIES:  is allergic to lisinopril and tape.  MEDICATIONS:  Current Outpatient Medications  Medication Sig Dispense Refill   atorvastatin (LIPITOR) 80 MG tablet TAKE 1 TABLET(80 MG) BY MOUTH AT BEDTIME 90 tablet 3   Blood Glucose Monitoring Suppl (ONETOUCH VERIO) w/Device KIT 30 Units by Does not apply route daily. 1 kit 0   carvedilol (COREG) 25 MG tablet TAKE 1 TABLET(25 MG) BY MOUTH TWICE DAILY 180 tablet 0   clopidogrel (PLAVIX) 75 MG tablet TAKE 1 TABLET(75 MG) BY MOUTH DAILY 90 tablet 2   Continuous Blood Gluc Receiver (FREESTYLE LIBRE 14 DAY READER) DEVI 1 Device by Does not apply route every morning. (Patient not taking: Reported on 08/09/2020) 1 each 3   furosemide (LASIX) 20 MG tablet Take 20 mg by mouth daily.     gabapentin (NEURONTIN) 400 MG capsule TAKE 1 CAPSULE BY MOUTH TWICE DAILY 60 capsule 3   Incontinence Supply Disposable (DEPEND ADJUSTABLE UNDERWEAR LG) MISC 1 application by Does not apply route as needed. 32 each 3   insulin glargine (LANTUS SOLOSTAR) 100 UNIT/ML Solostar Pen Inject 40 Units into the skin daily. (Patient taking differently: Inject 20 Units into the skin daily.) 15 mL 11   Insulin Pen Needle  (BD PEN NEEDLE NANO U/F) 32G X 4 MM MISC AS DIRECTED TWICE DAILY 100 each 3   isosorbide mononitrate (IMDUR) 30 MG 24 hr tablet TAKE 1 TABLET(30 MG) BY MOUTH DAILY 90 tablet 1   Lancets (ONETOUCH ULTRASOFT) lancets USE AS DIRECTED 100 each 0   lidocaine-prilocaine (EMLA) cream Apply to affected area once  30 g 3   Multiple Vitamins-Minerals (MULTIVITAMIN GUMMIES ADULT) CHEW Chew 2 each by mouth daily.     nitroGLYCERIN (NITROSTAT) 0.4 MG SL tablet PLACE 1 TABLET UNDER THE TONGUE EVERY 5 MINS AS NEEDED FOR CHEST PAIN 25 tablet 3   ondansetron (ZOFRAN) 8 MG tablet Take 1 tablet (8 mg total) by mouth every 8 (eight) hours as needed for nausea or vomiting. 30 tablet 1   ONETOUCH VERIO test strip USE TO TEST BLOOD SUGAR 2 TO 3 TIMES A DAY 100 strip 2   prochlorperazine (COMPAZINE) 10 MG tablet Take 1 tablet (10 mg total) by mouth every 6 (six) hours as needed for nausea or vomiting. 30 tablet 1   traMADol (ULTRAM) 50 MG tablet Take 1 tablet (50 mg total) by mouth every 8 (eight) hours as needed. 20 tablet 0   trimethoprim-polymyxin b (POLYTRIM) ophthalmic solution Place 1 drop into both eyes See admin instructions. 4 times a day for 2 days after eye injection (every 4 weeks)     TRULICITY 1.5 MG/0.5ML SOPN ADMINISTER 1.5 MG UNDER THE SKIN 1 TIME A WEEK (Patient taking differently: Inject 1.5 mg into the skin every Friday.) 2 mL 5   No current facility-administered medications for this visit.     PHYSICAL EXAMINATION: ECOG PERFORMANCE STATUS: 0 - Asymptomatic  Vitals:   03/17/22 1246  BP: 118/75  Pulse: (!) 104  Resp: 16  Temp: 97.7 F (36.5 C)  SpO2: 98%   Filed Weights   03/17/22 1246  Weight: 189 lb 4.8 oz (85.9 kg)    Physical Exam Constitutional:      Appearance: He is ill-appearing (He appears chronically ill, no acute distress).  Cardiovascular:     Rate and Rhythm: Normal rate and regular rhythm.     Pulses: Normal pulses.     Heart sounds: Normal heart sounds.  Abdominal:      Comments: Large mass in the right groin as noted in the photos.  There appears to be some focal area of erythema but no overt concern for infection or underlying purulence.  Please refer to the photo in media for further information  Musculoskeletal:        General: No swelling.     Cervical back: Normal range of motion and neck supple. No rigidity.  Lymphadenopathy:     Cervical: No cervical adenopathy.  Neurological:     Mental Status: He is alert.     LABORATORY DATA:  I have reviewed the data as listed Lab Results  Component Value Date   WBC 6.2 03/05/2022   HGB 11.4 (L) 03/05/2022   HCT 32.8 (L) 03/05/2022   MCV 80.2 03/05/2022   PLT 240 03/05/2022     Chemistry      Component Value Date/Time   NA 137 03/05/2022 1344   NA 143 08/13/2020 1442   K 4.5 03/05/2022 1344   CL 107 03/05/2022 1344   CO2 21 (L) 03/05/2022 1344   BUN 39 (H) 03/05/2022 1344   BUN 19 08/13/2020 1442   CREATININE 1.97 (H) 03/05/2022 1344   CREATININE 1.35 (H) 11/25/2015 1701      Component Value Date/Time   CALCIUM 9.1 03/05/2022 1344   ALKPHOS 102 03/05/2022 1344   AST 39 03/05/2022 1344   ALT 26 03/05/2022 1344   BILITOT 0.9 03/05/2022 1344       RADIOGRAPHIC STUDIES: I have personally reviewed the radiological images as listed and agreed with the findings in the report. US RENAL  Result Date: 03/05/2022 CLINICAL DATA:  Acute kidney injury.  rule out hydronephrosis EXAM: RENAL / URINARY TRACT ULTRASOUND COMPLETE COMPARISON:  Head CT 02/20/2022 FINDINGS: Right Kidney: Renal measurements: 10.5 x 5.8 x 5.6 cm = volume: 178 mL. Echogenicity within normal limits. No mass or hydronephrosis visualized. Left Kidney: Renal measurements: 10.2 x 6.4 x 5.9 cm = volume: 200 mL. Echogenicity within normal limits. No mass or hydronephrosis visualized. Urinary bladder: Appears normal for degree of bladder distention. Other: None. IMPRESSION: Unremarkable renal ultrasound. Electronically Signed   By:  Iven Finn M.D.   On: 03/05/2022 17:57   NM PET Image Restag (PS) Skull Base To Thigh  Result Date: 02/20/2022 CLINICAL DATA:  Initial treatment strategy for Merkel cell carcinoma. EXAM: NUCLEAR MEDICINE PET SKULL BASE TO THIGH TECHNIQUE: 9.24 mCi F-18 FDG was injected intravenously. Full-ring PET imaging was performed from the skull base to thigh after the radiotracer. CT data was obtained and used for attenuation correction and anatomic localization. Fasting blood glucose: 72 mg/dl COMPARISON:  CT pelvis 12/23/2021 FINDINGS: Mediastinal blood pool activity: SUV max 2.69 Liver activity: SUV max NA NECK: No tracer avid mass or adenopathy identified. Incidental CT findings: none CHEST: There is no tracer avid axillary, supraclavicular, mediastinal, or hilar lymph nodes. No tracer avid pulmonary nodule. Incidental CT findings: Aortic atherosclerosis and coronary artery calcifications. Small hiatal hernia noted. ABDOMEN/PELVIS: There is no abnormal tracer uptake identified within the liver, pancreas, spleen, or adrenal glands. No tracer avid lymph nodes within the abdomen. Multiple tracer avid right iliac and right inguinal lymph nodes are identified. This includes: Two small right common iliac lymph nodes measuring 1.5 cm with SUV max of 4.96, image 164/4. Just below the bifurcation of the right common iliac artery is a 1.1 cm lymph node with SUV max of 1.0, image 176/4. Cluster of lymph nodes within the right external iliac chain measures 2.2 x 3.1 cm within SUV max of 10.18, image 184/4. Nodal mass within the right inguinal region measures 7.6 x 6.8 cm with SUV max of 22.08, image 204/4. Focal area of nonspecific increased uptake within the proximal rectum at the 5 o'clock orientation has an SUV max 13.15 (which is above background bowel activity). On the corresponding CT images there is a mild area of asymmetric increased wall thickening measuring 1 cm, image 191/4. This is nonspecific. Mild increased  uptake within the prostate gland is noted which has an SUV max of 6.02. Incidental CT findings: Aortic atherosclerosis. Calcifications identified within the pancreas which may be the sequelae of chronic pancreatitis. Punctate bilateral renal calcifications noted. No hydronephrosis. SKELETON: No focal hypermetabolic activity to suggest skeletal metastasis. There is a small exophytic soft tissue density lesion which arises off the left inguinal region which was present on the study from 12/23/2021. This measures approximately 1.3 cm and has mild FDG uptake with SUV max of 1.96. Incidental CT findings: none IMPRESSION: 1. Enlarged FDG avid nodal metastases identified within the right common iliac, right external iliac, and right inguinal nodal chains. 2. There are no signs to suggest distant nodal metastasis or solid organ metastasis. 3. Nonspecific increased uptake with corresponding mild wall thickening is noted involving the proximal rectum. Correlation with colon cancer screening is advised. 4. There is also nonspecific increased uptake within the prostate gland. Consider further evaluation with PSA. 5. Indeterminate exophytic soft tissue nodule arising off the skin surface within the left inguinal region with mild nonspecific FDG uptake. Correlation with physical exam findings advised. 6. Aortic Atherosclerosis (  ICD10-I70.0). Coronary artery calcifications. 7. Small hiatal hernia. 8. Calcifications identified within the pancreas which may be the sequelae of chronic pancreatitis. Electronically Signed   By: Kerby Moors M.D.   On: 02/20/2022 10:09    All questions were answered. The patient knows to call the clinic with any problems, questions or concerns. I spent 30 minutes in the care of this patient including H and P, review of records, counseling and coordination of care.     Benay Pike, MD 03/17/2022 1:02 PM

## 2022-03-26 NOTE — Progress Notes (Unsigned)
Cardiology Office Note:    Date:  03/26/2022   ID:  Antonio Guerra, DOB [birth year], MRN [id number]  PCP:  [name], MD  [facility] Providers Cardiologist:  Candee Furbish, MD { Click to update primary MD,subspecialty MD or APP then REFRESH:1}  *** Referring MD: [name], MD   Chief Complaint:  No chief complaint on file. {Click here for Visit Info    :1}    History of Present Illness:   Antonio Guerra is a 71 y.o. male with   Coronary artery disease- 2011 MI x 2 with PCI: stent x 2 (LAD and RI) @ [facility] in [place], [place];  b. 04/2014 Cath/PCI: LM 10-20, LAD 40p, 46m 867mSR(3.5x38 Xience DES), 50apical, LCX 20 diffuse, RI 50p, 2039mR, RCA 40-7m71md.  Also history of CVA, HTN, HLD, DM2, ICM LVEF 15-20$ 2017.  Patient last saw Dr. SkaiMarlou Porch1 and doing well. Repeat echo ordered by never done.  Patient is on my schedule for Preop clearance for inguinal lymph node dissection [facility] SkaiMarlou Porchd it's ok to hold Plavix prior to surgery.    Past Medical History:  Diagnosis Date   Altered mental status    Arthritis    "all over" (12/20/2015)   Chronic lower back pain    Chronic systolic congestive heart failure (HCC)Interior a. 04/2014 Echo: EF 20-25%.   Coronary artery disease    a. 2011 MI x 2 with PCI: stent x 2 ([place]AD and RI[place]. 04/2014 Cath/PCI: LM 10-20, LAD 40p, 59m,62m I84m.5x38 Xience DES), 50apical, LCX 20 diffuse, RI 50p, 36m IS33mCA 40-7m, 3042m Fibromyalgia    Hyperlipidemia    Hypertension    Hypertensive urgency 09/27/2015   Myocardial infarction ([facility]) [facility] Stroke ([facility]) [facility] "right hand weaker since" (12/20/2015)   Type II diabetes mellitus ([facility])    [place] Medications: No outpatient medications have been marked as taking for the 04/01/22 encounter (Appointment) with Tandrea Kommer, MImogene Burn   Allergies:   Lisinopril and Tape   Social History   Tobacco Use   Smoking  status: Former   Smokeless tobacco: Never   Tobacco comments:    12/20/2015 "smoked cigarettes 30-53yr ago7yrping Use   Vaping Use: Never used  Substance Use Topics   Alcohol use: Not Currently    Alcohol/week: 5.0 standard drinks of alcohol    Types: 5 Cans of beer per week    Comment: stopped 4 yrs ago    Drug use: No    Family Hx: The patient's family history includes Diabetes in his mother. There is no history of CAD, Stomach cancer, Colon cancer, Esophageal cancer, or Pancreatic cancer.  ROS   EKGs/Labs/Other Test Reviewed:    EKG:  EKG is *** ordered today.  The ekg ordered today demonstrates ***  Recent Labs: 03/05/2022: ALT 26; BUN 39; Creatinine 1.97; Hemoglobin 11.4; Platelet Count 240; Potassium 4.5; Sodium 137; TSH 4.075   Recent Lipid Panel No results for input(s): "CHOL", "TRIG", "HDL", "VLDL", "LDLCALC", "LDLDIRECT" in the last 8760 hours.   Prior CV Studies: {Select studies to display:26339}  ***    Risk Assessment/Calculations/Metrics:   {Does this patient have ATRIAL FIBRILLATION?:[phone]}     No BP recorded.  {Refresh Note OR Click here to enter BP  :1}***    Physical Exam:    VS:  There were  no vitals taken for this visit.    Wt Readings from Last 3 Encounters:  03/17/22 189 lb 4.8 oz (85.9 kg)  02/16/22 186 lb 14.4 oz (84.8 kg)  12/31/20 200 lb (90.7 kg)    Physical Exam  GEN: Well nourished, well developed, in no acute distress  HEENT: normal  Neck: no JVD, carotid bruits, or masses Cardiac:RRR; no murmurs, rubs, or gallops  Respiratory:  clear to auscultation bilaterally, normal work of breathing GI: soft, nontender, nondistended, + BS Ext: without cyanosis, clubbing, or edema, Good distal pulses bilaterally MS: no deformity or atrophy  Skin: warm and dry, no rash Neuro:  Alert and Oriented x 3, Strength and sensation are intact Psych: euthymic mood, full affect       ASSESSMENT & PLAN:   No problem-specific Assessment & Plan  notes found for this encounter.   Preop clearance for inguinal lymph node dissection by Dr. Stark Klein [facility]. Dr. Marlou Porch said it's ok to hold Plavix prior to surgery.  Coronary artery disease a. 2011 MI x 2 with PCI: stent x 2 (LAD and RI) @ [facility] in [place], [place];  b. 04/2014 Cath/PCI: LM 10-20, LAD 40p, 98m 817mSR(3.5x38 Xience DES), 50apical, LCX 20 diffuse, RI 50p, 2065mR, RCA 40-59m81md.  ICM EF 20-25% echo 2015, 15-20% 2017  HTN  CVA  HLD  DM2      {Are you ordering a CV Procedure (e.g. stress test, cath, DCCV, TEE, etc)?   Press F2        :[id number]}ispo:  No follow-ups on file.   Medication Adjustments/Labs and Tests Ordered: Current medicines are reviewed at length with the patient today.  Concerns regarding medicines are outlined above.  Tests Ordered: No orders of the defined types were placed in this encounter.  Medication Changes: No orders of the defined types were placed in this encounter.  Signed, MichErmalinda Barrios-C  03/26/2022 8:22 AM    [id number]  [id number]: [id number]: [phone]

## 2022-03-27 ENCOUNTER — Inpatient Hospital Stay: Payer: Medicare (Managed Care) | Admitting: Adult Health

## 2022-03-27 ENCOUNTER — Inpatient Hospital Stay: Payer: Medicare (Managed Care)

## 2022-03-27 ENCOUNTER — Inpatient Hospital Stay: Payer: Medicare (Managed Care) | Attending: Hematology and Oncology

## 2022-03-27 DIAGNOSIS — I252 Old myocardial infarction: Secondary | ICD-10-CM | POA: Insufficient documentation

## 2022-03-27 DIAGNOSIS — C4A59 Merkel cell carcinoma of other part of trunk: Secondary | ICD-10-CM | POA: Insufficient documentation

## 2022-03-27 DIAGNOSIS — I2510 Atherosclerotic heart disease of native coronary artery without angina pectoris: Secondary | ICD-10-CM | POA: Insufficient documentation

## 2022-03-27 DIAGNOSIS — Z8673 Personal history of transient ischemic attack (TIA), and cerebral infarction without residual deficits: Secondary | ICD-10-CM | POA: Insufficient documentation

## 2022-03-27 DIAGNOSIS — E119 Type 2 diabetes mellitus without complications: Secondary | ICD-10-CM | POA: Insufficient documentation

## 2022-03-27 DIAGNOSIS — Z5112 Encounter for antineoplastic immunotherapy: Secondary | ICD-10-CM | POA: Insufficient documentation

## 2022-03-27 DIAGNOSIS — I110 Hypertensive heart disease with heart failure: Secondary | ICD-10-CM | POA: Insufficient documentation

## 2022-03-31 ENCOUNTER — Other Ambulatory Visit: Payer: Self-pay

## 2022-03-31 ENCOUNTER — Telehealth: Payer: Self-pay | Admitting: *Deleted

## 2022-03-31 NOTE — Telephone Encounter (Signed)
This RN spoke with the patient's daughter per her call today- she is stating concern about the area of his tumor- " it seems to be weeping " .  Note she states concern yesterday due to missed appointment on Friday(transportation was to be arranged thru [facility] but there was a miscommunication) for visit and treatment.  She feels the tumor is "getting worse" .  He is currently scheduled for 04/06/2022.  Per review with MD- pt is to come in tomorrow- may need radiation - if possible can treat tomorrow as well.  Per discussion with daughter- she will bring pt in for appt tomorrow.  Appts scheduled by this RN.  This RN contacted [facility] and informed them of above appointment.

## 2022-04-01 ENCOUNTER — Encounter: Payer: Self-pay | Admitting: Physician Assistant

## 2022-04-01 ENCOUNTER — Other Ambulatory Visit: Payer: Self-pay

## 2022-04-01 ENCOUNTER — Other Ambulatory Visit: Payer: Self-pay | Admitting: *Deleted

## 2022-04-01 ENCOUNTER — Encounter: Payer: Self-pay | Admitting: Hematology and Oncology

## 2022-04-01 ENCOUNTER — Ambulatory Visit: Payer: Medicare (Managed Care) | Attending: Physician Assistant | Admitting: Physician Assistant

## 2022-04-01 ENCOUNTER — Encounter: Payer: Self-pay | Admitting: *Deleted

## 2022-04-01 ENCOUNTER — Encounter (INDEPENDENT_AMBULATORY_CARE_PROVIDER_SITE_OTHER): Payer: Medicare (Managed Care) | Admitting: Ophthalmology

## 2022-04-01 ENCOUNTER — Inpatient Hospital Stay (HOSPITAL_BASED_OUTPATIENT_CLINIC_OR_DEPARTMENT_OTHER): Payer: Medicare (Managed Care) | Admitting: Hematology and Oncology

## 2022-04-01 ENCOUNTER — Inpatient Hospital Stay: Payer: Medicare (Managed Care)

## 2022-04-01 VITALS — BP 108/62 | HR 108 | Ht 73.0 in | Wt 184.0 lb

## 2022-04-01 VITALS — BP 106/61 | HR 108 | Temp 98.1°F | Resp 16 | Ht 73.0 in | Wt 183.5 lb

## 2022-04-01 DIAGNOSIS — Z8673 Personal history of transient ischemic attack (TIA), and cerebral infarction without residual deficits: Secondary | ICD-10-CM

## 2022-04-01 DIAGNOSIS — C4A9 Merkel cell carcinoma, unspecified: Secondary | ICD-10-CM | POA: Diagnosis not present

## 2022-04-01 DIAGNOSIS — C7B1 Secondary Merkel cell carcinoma: Secondary | ICD-10-CM

## 2022-04-01 DIAGNOSIS — I10 Essential (primary) hypertension: Secondary | ICD-10-CM

## 2022-04-01 DIAGNOSIS — I252 Old myocardial infarction: Secondary | ICD-10-CM | POA: Diagnosis not present

## 2022-04-01 DIAGNOSIS — I2510 Atherosclerotic heart disease of native coronary artery without angina pectoris: Secondary | ICD-10-CM | POA: Diagnosis not present

## 2022-04-01 DIAGNOSIS — Z5112 Encounter for antineoplastic immunotherapy: Secondary | ICD-10-CM | POA: Diagnosis present

## 2022-04-01 DIAGNOSIS — Z01818 Encounter for other preprocedural examination: Secondary | ICD-10-CM | POA: Diagnosis not present

## 2022-04-01 DIAGNOSIS — E1100 Type 2 diabetes mellitus with hyperosmolarity without nonketotic hyperglycemic-hyperosmolar coma (NKHHC): Secondary | ICD-10-CM

## 2022-04-01 DIAGNOSIS — Z794 Long term (current) use of insulin: Secondary | ICD-10-CM

## 2022-04-01 DIAGNOSIS — I255 Ischemic cardiomyopathy: Secondary | ICD-10-CM | POA: Diagnosis not present

## 2022-04-01 DIAGNOSIS — I110 Hypertensive heart disease with heart failure: Secondary | ICD-10-CM | POA: Diagnosis not present

## 2022-04-01 DIAGNOSIS — E7849 Other hyperlipidemia: Secondary | ICD-10-CM

## 2022-04-01 DIAGNOSIS — C4A59 Merkel cell carcinoma of other part of trunk: Secondary | ICD-10-CM | POA: Diagnosis present

## 2022-04-01 DIAGNOSIS — E119 Type 2 diabetes mellitus without complications: Secondary | ICD-10-CM | POA: Diagnosis not present

## 2022-04-01 LAB — CBC WITH DIFFERENTIAL/PLATELET
Abs Immature Granulocytes: 0.01 10*3/uL (ref 0.00–0.07)
Basophils Absolute: 0 10*3/uL (ref 0.0–0.1)
Basophils Relative: 0 %
Eosinophils Absolute: 0.1 10*3/uL (ref 0.0–0.5)
Eosinophils Relative: 2 %
HCT: 30.5 % — ABNORMAL LOW (ref 39.0–52.0)
Hemoglobin: 10.3 g/dL — ABNORMAL LOW (ref 13.0–17.0)
Immature Granulocytes: 0 %
Lymphocytes Relative: 32 %
Lymphs Abs: 1.4 10*3/uL (ref 0.7–4.0)
MCH: 28.1 pg (ref 26.0–34.0)
MCHC: 33.8 g/dL (ref 30.0–36.0)
MCV: 83.1 fL (ref 80.0–100.0)
Monocytes Absolute: 0.5 10*3/uL (ref 0.1–1.0)
Monocytes Relative: 12 %
Neutro Abs: 2.4 10*3/uL (ref 1.7–7.7)
Neutrophils Relative %: 54 %
Platelets: 200 10*3/uL (ref 150–400)
RBC: 3.67 MIL/uL — ABNORMAL LOW (ref 4.22–5.81)
RDW: 15.4 % (ref 11.5–15.5)
WBC: 4.5 10*3/uL (ref 4.0–10.5)
nRBC: 0 % (ref 0.0–0.2)

## 2022-04-01 LAB — COMPREHENSIVE METABOLIC PANEL
ALT: 28 U/L (ref 0–44)
AST: 36 U/L (ref 15–41)
Albumin: 3.6 g/dL (ref 3.5–5.0)
Alkaline Phosphatase: 93 U/L (ref 38–126)
Anion gap: 2 — ABNORMAL LOW (ref 5–15)
BUN: 43 mg/dL — ABNORMAL HIGH (ref 8–23)
CO2: 25 mmol/L (ref 22–32)
Calcium: 9.3 mg/dL (ref 8.9–10.3)
Chloride: 108 mmol/L (ref 98–111)
Creatinine, Ser: 1.83 mg/dL — ABNORMAL HIGH (ref 0.61–1.24)
GFR, Estimated: 39 mL/min — ABNORMAL LOW (ref >60)
Glucose, Bld: 195 mg/dL — ABNORMAL HIGH (ref 70–99)
Potassium: 4.7 mmol/L (ref 3.5–5.1)
Sodium: 135 mmol/L (ref 135–145)
Total Bilirubin: 0.5 mg/dL (ref 0.3–1.2)
Total Protein: 7 g/dL (ref 6.5–8.1)

## 2022-04-01 MED ORDER — SODIUM CHLORIDE 0.9 % IV SOLN
200.0000 mg | Freq: Once | INTRAVENOUS | Status: AC
  Administered 2022-04-01: 200 mg via INTRAVENOUS
  Filled 2022-04-01: qty 200

## 2022-04-01 MED ORDER — SODIUM CHLORIDE 0.9 % IV SOLN: Freq: Once | INTRAVENOUS | Status: AC

## 2022-04-01 NOTE — Progress Notes (Signed)
[place] NOTE  Patient Care Team: [name], MD as PCP - General (Family Medicine) Jerline Pain, MD as PCP - Cardiology (Cardiology) [facility] 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   Since his last visit he had PET/CT which showed enlarged FDG avid nodal metastasis identified in the right common iliac, external iliac and right inguinal node chains.  No signs to suggest distant nodal metastasis or solid organ metastasis.  We have discussed about trying immunotherapy since it is likely unresectable at this time based on my discussion with Dr. Barry Dienes. He started cycle 1 of neoadjuvant Keytruda on August 17 and was seen for an interim visit with concern for infection.  He once again missed his second cycle of immunotherapy planned for Friday because of some miscommunication with the pace program.  He is now here for an unplanned visit since the daughter is very worried about tumor progression.  I do not see a significant change compared to my most recent visit however have noticed that the tumor definitely feels softer to palpation with suggest area of necrosis/response to treatment.  At this time we have discussed that it is hard to make a comment if he is responsive to immunotherapy since he only had 1 cycle.  We have encouraged him to proceed with second cycle as scheduled for today followed by consideration for repeat imaging.  He will also follow-up with Dr. Isidore Moos for consideration of neoadjuvant radiation.  We have overall discussed several times that Merkel cell is a very  aggressive malignancy and although he may have significant response to treatment, typically there could be relapsed/distant metastasis.  As long as his performance status permits and as long as he does not have much toxicity from treatment, we are willing to continue treatment.  However he has several other comorbidities and has limited PS hence he may not be a candidate for aggressive treatments.  Daughter tells me that they have met with cardiology team who do not believe he is a good candidate for surgery.  We are still awaiting formal recommendations.  Return to clinic as planned for cycle 3 of treatment.  Orders Placed This Encounter  Procedures   CBC with Differential ([place] Only)    Standing Status:   Future    Standing Expiration Date:   04/23/2023   CMP (Cancer Center only)    Standing Status:   Future    Standing Expiration Date:   04/23/2023   T4    Standing Status:   Future    Standing Expiration Date:   04/23/2023   TSH    Standing Status:   Future    Standing Expiration Date:   04/23/2023    HISTORY OF PRESENTING ILLNESS:  Jaret Coppedge 71 y.o. male is here because of merkel cell carcinoma  This is a 71 year old male patient who was evaluated for right groin pain, questionable boil or hernia.  CT pelvis demonstrated 4.7 x 5.2 x 6.6 cm nodular lobulated mass at the right inguinal region recommend a needle biopsy.  Ultrasound-guided core biopsy of the right inguinal mass demonstrated poorly differentiated carcinoma with neuroendocrine features suggestive of a  Merkel cell carcinoma. PET CT 02/20/2022 Enlarged FDG avid nodal metastases identified within the right common iliac, right external iliac, and right inguinal nodal chains.There are no signs to suggest distant nodal metastasis or solid organ metastasis. Since he had locally advanced disease, we discussed about neoadjuvant immunotherapy based on some small group institutional experiences He started cycle 1 of  immunotherapy on 03/05/2022.  He is here for an interim, unplanned visit because of some concern for infection in the inguinal mass region.   Interval history  Mr. Georgia Lopes was initially scheduled for cycle 2 of immunotherapy on Friday but apparently there was a miscommunication with his pace program and he did not show up to his appointment.  Since then we got a phone call from his daughter that she is very worried about tumor progression and possible discharge from the area.  However she today mentions that she is not sure of any discharge but she is worried that the tumor has progressed.  Mr. Hiram Comber said that he tolerated immunotherapy really well without any adverse effects.  He has seen cardiology who do not believe he is a good surgical candidate but waiting some more work-up.  He is scheduled to see Dr. Isidore Moos for radiation in a couple days. Rest of the pertinent 10 point ROS reviewed and negative  MEDICAL HISTORY:  Past Medical History:  Diagnosis Date   Altered mental status    Arthritis    "all over" (12/20/2015)   Chronic lower back pain    Chronic systolic congestive heart failure ([place])    a. 04/2014 Echo: EF 20-25%.   Coronary artery disease    a. 2011 MI x 2 with PCI: stent x 2 (LAD and RI) @ [facility] in [place], [place];  b. 04/2014 Cath/PCI: LM 10-20, LAD 40p, 35m 858mSR(3.5x38 Xience DES), 50apical, LCX 20 diffuse, RI 50p, 2039mR, RCA 40-68m43md.   Fibromyalgia    Hyperlipidemia    Hypertension    Hypertensive urgency 09/27/2015   [facility]   [facility]   "right hand weaker since" (12/20/2015)   Type II diabetes mellitus (HCC)[place]  SURGICAL HISTORY: Past Surgical History:  Procedure Laterality Date   ANKLE FRACTURE SURGERY Right    CARDIAC CATHETERIZATION N/A 09/30/2015   Procedure: Left Heart Cath and Coronary Angiography;  Surgeon: ChriBurnell Blanks;  [facility];  Service: Cardiovascular;  Laterality: N/A;    CORONARY ANGIOPLASTY WITH STENT PLACEMENT     "total of 3 stents" (12/20/2015)   FRACTIONAL FLOW RESERVE WIRE  05/11/2014   Procedure: FRACTIONAL FLOW RESERVE WIRE;  Surgeon: ChriBurnell Blanks;  [facility];  Service: Cardiovascular;;   FRACTURE SURGERY     LEFT HEART CATHETERIZATION WITH CORONARY/GRAFT ANGIOGRAM N/A 05/11/2014   Procedure: LEFT HEART CATHETERIZATION WITH COROBeatrix Fettersurgeon: ChriBurnell Blanks;  [facility];  Service: Cardiovascular;  Laterality: N/A;   PERCUTANEOUS CORONARY STENT INTERVENTION (PCI-S)  05/11/2014   Procedure: PERCUTANEOUS CORONARY STENT INTERVENTION (PCI-S);  Surgeon: ChriBurnell Blanks;  [facility];  Service: Cardiovascular;;    SOCIAL HISTORY: Social History   Socioeconomic History   Marital status: Married    Spouse name: Not on file   Number of children: Not on file   Years of education: Not on file   Highest education level: Not on file  Occupational History   Occupation: retired   Tobacco Use   Smoking  status: Former   Smokeless tobacco: Never   Tobacco comments:    12/20/2015 "smoked cigarettes 30-36yrago"  Vaping Use   Vaping Use: Never used  Substance and Sexual Activity   Alcohol use: Not Currently    Alcohol/week: 5.0 standard drinks of alcohol    Types: 5 Cans of beer per week    Comment: stopped 4 yrs ago    Drug use: No   Sexual activity: Not Currently    Partners: Female  Other Topics Concern   Not on file  Social History Narrative   Current Social History        Who lives at home: lives with daughter MSharyn Lull82022-08-11   Who would speak for you about health care matters: MSharyn Lull811-Aug-2022   Transportation: Has [employer] and daughter takes to appointments 82022/08/11  Current Stressors: wife and daughter passed away in 203/05/2022808-05-2021  Work / Education:  [place] [employer] 11, 2022  Other: limited mobility uses cane and  walker and memory decline ( per daughter)  808/11/22                                                                                                      Social Determinants of Health   Financial Resource Strain: Low Risk  (03/11/2021)   Overall Financial Resource Strain (CARDIA)    Difficulty of Paying Living Expenses: Not hard at all  Food Insecurity: No Food Insecurity (03/11/2021)   Hunger Vital Sign    Worried About Running Out of Food in the Last Year: Never true    RDoonin the Last Year: Never true  Transportation Needs: No Transportation Needs (03/11/2021)   PRAPARE - THydrologist(Medical): No    Lack of Transportation (Non-Medical): No  Physical Activity: Inactive (03/11/2021)   Exercise Vital Sign    Days of Exercise per Week: 0 days    Minutes of Exercise per Session: 0 min  Stress: Stress Concern Present (03/11/2021)   [place]   Feeling of Stress : Rather much  Social Connections: Socially Isolated (03/11/2021)   Social Connection and Isolation Panel [NHANES]    Frequency of Communication with Friends and Family: Once a week    Frequency of Social Gatherings with Friends and Family: Once a week    Attends Religious Services: Never    AMarine scientistor Organizations: No    Attends CArchivistMeetings: Never    Marital Status: Widowed  Intimate Partner Violence: Not At Risk (03/11/2021)   Humiliation, Afraid, Rape, and Kick questionnaire    Fear of Current or Ex-Partner: No    Emotionally Abused: No    Physically Abused: No    Sexually Abused: No    FAMILY HISTORY: Family History  Problem Relation Age of Onset   Diabetes Mother    CAD Neg Hx    Stomach cancer Neg Hx    Colon cancer Neg Hx  Esophageal cancer Neg Hx    Pancreatic cancer Neg Hx     ALLERGIES:  is allergic to lisinopril and tape.  MEDICATIONS:  Current Outpatient  Medications  Medication Sig Dispense Refill   atorvastatin (LIPITOR) 80 MG tablet TAKE 1 TABLET(80 MG) BY MOUTH AT BEDTIME 90 tablet 3   Blood Glucose Monitoring Suppl (ONETOUCH VERIO) w/Device KIT 30 Units by Does not apply route daily. 1 kit 0   carvedilol (COREG) 25 MG tablet TAKE 1 TABLET(25 MG) BY MOUTH TWICE DAILY 180 tablet 0   clopidogrel (PLAVIX) 75 MG tablet TAKE 1 TABLET(75 MG) BY MOUTH DAILY 90 tablet 2   Continuous Blood Gluc Receiver (FREESTYLE LIBRE 14 DAY READER) DEVI 1 Device by Does not apply route every morning. 1 each 3   furosemide (LASIX) 20 MG tablet Take 20 mg by mouth daily.     gabapentin (NEURONTIN) 400 MG capsule TAKE 1 CAPSULE BY MOUTH TWICE DAILY 60 capsule 3   Incontinence Supply Disposable (DEPEND ADJUSTABLE UNDERWEAR LG) MISC 1 application by Does not apply route as needed. 32 each 3   insulin glargine (LANTUS SOLOSTAR) 100 UNIT/ML Solostar Pen Inject 40 Units into the skin daily. (Patient taking differently: Inject 20 Units into the skin daily.) 15 mL 11   Insulin Pen Needle (BD PEN NEEDLE NANO U/F) 32G X 4 MM MISC AS DIRECTED TWICE DAILY 100 each 3   isosorbide mononitrate (IMDUR) 30 MG 24 hr tablet TAKE 1 TABLET(30 MG) BY MOUTH DAILY 90 tablet 1   Lancets (ONETOUCH ULTRASOFT) lancets USE AS DIRECTED 100 each 0   lidocaine-prilocaine (EMLA) cream Apply to affected area once 30 g 3   Multiple Vitamins-Minerals (MULTIVITAMIN GUMMIES ADULT) CHEW Chew 2 each by mouth daily.     nitroGLYCERIN (NITROSTAT) 0.4 MG SL tablet PLACE 1 TABLET UNDER THE TONGUE EVERY 5 MINS AS NEEDED FOR CHEST PAIN 25 tablet 3   ondansetron (ZOFRAN) 8 MG tablet Take 1 tablet (8 mg total) by mouth every 8 (eight) hours as needed for nausea or vomiting. 30 tablet 1   ONETOUCH VERIO test strip USE TO TEST BLOOD SUGAR 2 TO 3 TIMES A DAY 100 strip 2   prochlorperazine (COMPAZINE) 10 MG tablet Take 1 tablet (10 mg total) by mouth every 6 (six) hours as needed for nausea or vomiting. 30 tablet 1    traMADol (ULTRAM) 50 MG tablet Take 1 tablet (50 mg total) by mouth every 8 (eight) hours as needed. 20 tablet 0   trimethoprim-polymyxin b (POLYTRIM) ophthalmic solution Place 1 drop into both eyes See admin instructions. 4 times a day for 2 days after eye injection (every 4 weeks)     TRULICITY 1.5 ZC/5.8IF SOPN ADMINISTER 1.5 MG UNDER THE SKIN 1 TIME A WEEK (Patient taking differently: Inject 1.5 mg into the skin every Friday.) 2 mL 5   No current facility-administered medications for this visit.   Facility-Administered Medications Ordered in Other Visits  Medication Dose Route Frequency Provider Last Rate Last Admin   pembrolizumab (KEYTRUDA) 200 mg in sodium chloride 0.9 % 50 mL chemo infusion  200 mg Intravenous Once Benay Pike, MD 116 mL/hr at 04/01/22 1435 200 mg at 04/01/22 1435     PHYSICAL EXAMINATION: ECOG PERFORMANCE STATUS: 0 - Asymptomatic  Vitals:   04/01/22 1241  BP: 106/61  Pulse: (!) 108  Resp: 16  Temp: 98.1 F (36.7 C)  SpO2: 100%   Filed Weights   04/01/22 1241  Weight: 183 lb 8 oz (83.2 kg)  Physical Exam Constitutional:      Appearance: He is ill-appearing (He appears chronically ill, no acute distress).  Cardiovascular:     Rate and Rhythm: Normal rate and regular rhythm.     Pulses: Normal pulses.     Heart sounds: Normal heart sounds.  Abdominal:     Comments: Large mass in the right groin which appears slightly larger compared to his last visit.  No evidence of infection.  Although the mass appears larger it feels softer on palpation which suggest areas of necrosis.  There is no other findings on physical examination today.  Musculoskeletal:        General: No swelling.     Cervical back: Normal range of motion and neck supple. No rigidity.  Lymphadenopathy:     Cervical: No cervical adenopathy.  Neurological:     Mental Status: He is alert.     LABORATORY DATA:  I have reviewed the data as listed Lab Results  Component Value Date    WBC 4.5 04/01/2022   HGB 10.3 (L) 04/01/2022   HCT 30.5 (L) 04/01/2022   MCV 83.1 04/01/2022   PLT 200 04/01/2022     Chemistry      Component Value Date/Time   NA 135 04/01/2022 1206   NA 143 08/13/2020 1442   K 4.7 04/01/2022 1206   CL 108 04/01/2022 1206   CO2 25 04/01/2022 1206   BUN 43 (H) 04/01/2022 1206   BUN 19 08/13/2020 1442   CREATININE 1.83 (H) 04/01/2022 1206   CREATININE 1.97 (H) 03/05/2022 1344   CREATININE 1.35 (H) 11/25/2015 1701      Component Value Date/Time   CALCIUM 9.3 04/01/2022 1206   ALKPHOS 93 04/01/2022 1206   AST 36 04/01/2022 1206   AST 39 03/05/2022 1344   ALT 28 04/01/2022 1206   ALT 26 03/05/2022 1344   BILITOT 0.5 04/01/2022 1206   BILITOT 0.9 03/05/2022 1344       RADIOGRAPHIC STUDIES: I have personally reviewed the radiological images as listed and agreed with the findings in the report. US RENAL  Result Date: 03/05/2022 CLINICAL DATA:  Acute kidney injury.  rule out hydronephrosis EXAM: RENAL / URINARY TRACT ULTRASOUND COMPLETE COMPARISON:  Head CT 02/20/2022 FINDINGS: Right Kidney: Renal measurements: 10.5 x 5.8 x 5.6 cm = volume: 178 mL. Echogenicity within normal limits. No mass or hydronephrosis visualized. Left Kidney: Renal measurements: 10.2 x 6.4 x 5.9 cm = volume: 200 mL. Echogenicity within normal limits. No mass or hydronephrosis visualized. Urinary bladder: Appears normal for degree of bladder distention. Other: None. IMPRESSION: Unremarkable renal ultrasound. Electronically Signed   By: Iven Finn M.D.   On: 03/05/2022 17:57    All questions were answered. The patient knows to call the clinic with any problems, questions or concerns. I spent 30 minutes in the care of this patient including H and P, review of records, counseling and coordination of care.     Benay Pike, MD 04/01/2022 2:44 PM

## 2022-04-01 NOTE — Progress Notes (Signed)
Per Dr Chryl Heck, Faythe Ghee to treat with creatinine 1.83

## 2022-04-01 NOTE — Patient Instructions (Signed)
Medication Instructions:  Your physician recommends that you continue on your current medications as directed. Please refer to the Current Medication list given to you today.  *If you need a refill on your cardiac medications before your next appointment, please call your pharmacy*   Lab Work: None If you have labs (blood work) drawn today and your tests are completely normal, you will receive your results only by: [place] (if you have MyChart) OR A paper copy in the mail If you have any lab test that is abnormal or we need to change your treatment, we will call you to review the results.   Testing/Procedures: Your physician has requested that you have an echocardiogram. Echocardiography is a painless test that uses sound waves to create images of your heart. It provides your doctor with information about the size and shape of your heart and how well your heart's chambers and valves are working. This procedure takes approximately one hour. There are no restrictions for this procedure.   Your physician has requested that you have a lexiscan myoview. For further information please visit [email or web address]. Please follow instruction sheet, as given.    Follow-Up: At [facility], you and your health needs are our priority.  As part of our continuing mission to provide you with exceptional heart care, we have created designated Provider Care Teams.  These Care Teams include your primary Cardiologist (physician) and Advanced Practice Providers (APPs -  Physician Assistants and Nurse Practitioners) who all work together to provide you with the care you need, when you need it.  We recommend signing up for the patient portal called "MyChart".  Sign up information is provided on this After Visit Summary.  MyChart is used to connect with patients for Virtual Visits (Telemedicine).  Patients are able to view lab/test results, encounter notes, upcoming appointments, etc.  Non-urgent  messages can be sent to your provider as well.   To learn more about what you can do with MyChart, go to [email or web address].    Your next appointment:   Schedule a follow up appointment on a date that is after your testing is completed  The format for your next appointment:   In Person  Provider:   Candee Furbish, MD    Important Information About Sugar

## 2022-04-01 NOTE — Patient Instructions (Signed)
[facility]  Discharge Instructions: Thank you for choosing [facility] to provide your oncology and hematology care.   If you have a lab appointment with the [facility], please go directly to the [facility] and check in at the registration area.   Wear comfortable clothing and clothing appropriate for easy access to any Portacath or PICC line.   We strive to give you quality time with your provider. You may need to reschedule your appointment if you arrive late (15 or more minutes).  Arriving late affects you and other patients whose appointments are after yours.  Also, if you miss three or more appointments without notifying the office, you may be dismissed from the clinic at the provider's discretion.      For prescription refill requests, have your pharmacy contact our office and allow 72 hours for refills to be completed.    Today you received the following chemotherapy and/or immunotherapy agents : Keytruda      To help prevent nausea and vomiting after your treatment, we encourage you to take your nausea medication as directed.  BELOW ARE SYMPTOMS THAT SHOULD BE REPORTED IMMEDIATELY: *FEVER GREATER THAN 100.4 F (38 C) OR HIGHER *CHILLS OR SWEATING *NAUSEA AND VOMITING THAT IS NOT CONTROLLED WITH YOUR NAUSEA MEDICATION *UNUSUAL SHORTNESS OF BREATH *UNUSUAL BRUISING OR BLEEDING *URINARY PROBLEMS (pain or burning when urinating, or frequent urination) *BOWEL PROBLEMS (unusual diarrhea, constipation, pain near the anus) TENDERNESS IN MOUTH AND THROAT WITH OR WITHOUT PRESENCE OF ULCERS (sore throat, sores in mouth, or a toothache) UNUSUAL RASH, SWELLING OR PAIN  UNUSUAL VAGINAL DISCHARGE OR ITCHING   Items with * indicate a potential emergency and should be followed up as soon as possible or go to the Emergency Department if any problems should occur.  Please show the CHEMOTHERAPY ALERT CARD or IMMUNOTHERAPY ALERT CARD at check-in to  the Emergency Department and triage nurse.  Should you have questions after your visit or need to cancel or reschedule your appointment, please contact [facility]  Dept: [phone]  and follow the prompts.  Office hours are 8:00 a.m. to 4:30 p.m. Monday - Friday. Please note that voicemails left after 4:00 p.m. may not be returned until the following business day.  We are closed weekends and major holidays. You have access to a nurse at all times for urgent questions. Please call the main number to the clinic Dept: [phone] and follow the prompts.   For any non-urgent questions, you may also contact your provider using MyChart. We now offer e-Visits for anyone 23 and older to request care online for non-urgent symptoms. For details visit [email or web address].   Also download the MyChart app! Go to the app store, search "MyChart", open the app, select [facility], and log in with your MyChart username and password.  Masks are optional in the cancer centers. If you would like for your care team to wear a mask while they are taking care of you, please let them know. You may have one support Antonio Guerra who is at least 70 years old accompany you for your appointments. Pembrolizumab Injection What is this medication? PEMBROLIZUMAB (PEM broe LIZ ue mab) treats some types of cancer. It works by helping your immune system slow or stop the spread of cancer cells. It is a monoclonal antibody. This medicine may be used for other purposes; ask your health care provider or pharmacist if you have questions. COMMON BRAND NAME(S): Keytruda What  should I tell my care team before I take this medication? They need to know if you have any of these conditions: Allogeneic stem cell transplant (uses someone else's stem cells) Autoimmune diseases, such as Crohn disease, ulcerative colitis, lupus History of chest radiation Nervous system problems, such as Guillain-Barre syndrome,  myasthenia gravis Organ transplant An unusual or allergic reaction to pembrolizumab, other medications, foods, dyes, or preservatives Pregnant or trying to get pregnant Breast-feeding How should I use this medication? This medication is injected into a vein. It is given by your care team in a hospital or clinic setting. A special MedGuide will be given to you before each treatment. Be sure to read this information carefully each time. Talk to your care team about the use of this medication in children. While it may be prescribed for children as young as 6 months for selected conditions, precautions do apply. Overdosage: If you think you have taken too much of this medicine contact a poison control center or emergency room at once. NOTE: This medicine is only for you. Do not share this medicine with others. What if I miss a dose? Keep appointments for follow-up doses. It is important not to miss your dose. Call your care team if you are unable to keep an appointment. What may interact with this medication? Interactions have not been studied. This list may not describe all possible interactions. Give your health care provider a list of all the medicines, herbs, non-prescription drugs, or dietary supplements you use. Also tell them if you smoke, drink alcohol, or use illegal drugs. Some items may interact with your medicine. What should I watch for while using this medication? Your condition will be monitored carefully while you are receiving this medication. You may need blood work while taking this medication. This medication may cause serious skin reactions. They can happen weeks to months after starting the medication. Contact your care team right away if you notice fevers or flu-like symptoms with a rash. The rash may be red or purple and then turn into blisters or peeling of the skin. You may also notice a red rash with swelling of the face, lips, or lymph nodes in your neck or under your  arms. Tell your care team right away if you have any change in your eyesight. Talk to your care team if you may be pregnant. Serious birth defects can occur if you take this medication during pregnancy and for 4 months after the last dose. You will need a negative pregnancy test before starting this medication. Contraception is recommended while taking this medication and for 4 months after the last dose. Your care team can help you find the option that works for you. Do not breastfeed while taking this medication and for 4 months after the last dose. What side effects may I notice from receiving this medication? Side effects that you should report to your care team as soon as possible: Allergic reactions--skin rash, itching, hives, swelling of the face, lips, tongue, or throat Dry cough, shortness of breath or trouble breathing Eye pain, redness, irritation, or discharge with blurry or decreased vision Heart muscle inflammation--unusual weakness or fatigue, shortness of breath, chest pain, fast or irregular heartbeat, dizziness, swelling of the ankles, feet, or hands Hormone gland problems--headache, sensitivity to light, unusual weakness or fatigue, dizziness, fast or irregular heartbeat, increased sensitivity to cold or heat, excessive sweating, constipation, hair loss, increased thirst or amount of urine, tremors or shaking, irritability Infusion reactions--chest pain, shortness of breath   or trouble breathing, feeling faint or lightheaded Kidney injury (glomerulonephritis)--decrease in the amount of urine, red or dark brown urine, foamy or bubbly urine, swelling of the ankles, hands, or feet Liver injury--right upper belly pain, loss of appetite, nausea, light-colored stool, dark yellow or brown urine, yellowing skin or eyes, unusual weakness or fatigue Pain, tingling, or numbness in the hands or feet, muscle weakness, change in vision, confusion or trouble speaking, loss of balance or  coordination, trouble walking, seizures Rash, fever, and swollen lymph nodes Redness, blistering, peeling, or loosening of the skin, including inside the mouth Sudden or severe stomach pain, bloody diarrhea, fever, nausea, vomiting Side effects that usually do not require medical attention (report to your care team if they continue or are bothersome): Bone, joint, or muscle pain Diarrhea Fatigue Loss of appetite Nausea Skin rash This list may not describe all possible side effects. Call your doctor for medical advice about side effects. You may report side effects to FDA at 1-800-FDA-1088. Where should I keep my medication? This medication is given in a hospital or clinic. It will not be stored at home. NOTE: This sheet is a summary. It may not cover all possible information. If you have questions about this medicine, talk to your doctor, pharmacist, or health care provider.  2023 Elsevier/Gold Standard (2021-10-27 00:00:00)

## 2022-04-01 NOTE — Assessment & Plan Note (Signed)
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   Since his last visit he had PET/CT which showed enlarged FDG avid nodal metastasis identified in the right common iliac, external iliac and right inguinal node chains.  No signs to suggest distant nodal metastasis or solid organ metastasis.  We have discussed about trying immunotherapy since it is likely unresectable at this time based on my discussion with Dr. Barry Dienes. He started cycle 1 of neoadjuvant Keytruda on August 17 and was seen for an interim visit with concern for infection.  He once again missed his second cycle of immunotherapy planned for Friday because of some miscommunication with the pace program.  He is now here for an unplanned visit since the daughter is very worried about tumor progression.  I do not see a significant change compared to my most recent visit however have noticed that the tumor definitely feels softer to palpation with suggest area of necrosis/response to treatment.  At this time we have discussed that it is hard to make a comment if he is responsive to immunotherapy since he only had 1 cycle.  We have encouraged him to proceed with second cycle as scheduled for today followed by consideration for repeat imaging.  He will also follow-up with Dr. Isidore Moos for consideration of neoadjuvant radiation.  We have overall discussed several times that Merkel cell is a very aggressive malignancy and although he may have significant response to treatment, typically there could be relapsed/distant metastasis.  As long as his performance status permits and as long as he does not have much toxicity from treatment, we are willing to continue treatment.  However he has several other comorbidities and has limited PS hence he may not be a  candidate for aggressive treatments.  Daughter tells me that they have met with cardiology team who do not believe he is a good candidate for surgery.  We are still awaiting formal recommendations.  Return to clinic as planned for cycle 3 of treatment.

## 2022-04-02 ENCOUNTER — Encounter: Payer: Self-pay | Admitting: Hematology and Oncology

## 2022-04-02 ENCOUNTER — Other Ambulatory Visit: Payer: Self-pay

## 2022-04-02 ENCOUNTER — Telehealth: Payer: Self-pay | Admitting: Family Medicine

## 2022-04-02 NOTE — Progress Notes (Signed)
Radiation Oncology         [phone] ________________________________  Initial Outpatient Consultation  Name: Antonio Guerra MRN: [id number]  Date: 04/03/2022  DOB: [birth year]  NP:[id number], Antonio Punt, MD  Benay Pike, MD   REFERRING PHYSICIAN: Benay Pike, MD  DIAGNOSIS: No diagnosis found.  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  CHIEF COMPLAINT: Here to discuss management of merkel cell carcinoma  HISTORY OF PRESENT ILLNESS::Antonio Guerra is a 71 y.o. male with a past medical history significant for diabetes, hypertension, history of MI, coronary artery disease, and stroke.   The patient presented to Dr. Georgette Dover on 01/26/22 for evaluation of right groin swelling. During that visit, the patient reported onset of swelling 6 weeks prior. He also reported tenderness, increased difficulty with ambulation due right groin swelling, and generalized fatigue. Physical exam performed during this visit revealed large palpable / visible right groin lymphadenopathy, with the largest visible mass measuring about 8 cm in diameter. No overlying skin changes were appreciated.   Prior to evaluation by Dr. Georgette Dover, the patient had a pelvic CT performed on 12/23/21 which showed a 4.7 x 5.2 x 6.6 cm nodular-lobulated mass at the right inguinal region, along with smaller sub-centimeter subjacent lymph nodes. (CT also noted moderate prostatomegaly; prostate appreciated to measure 6 x 4.6 cm).   Accordingly, the patient underwent biopsy of the right inguinal lymph node on 01/27/22. Pathology revealed poorly differentiated carcinoma with neuroendocrine features suggestive of merkel cell carcinoma. (Diagnostic comment also noted proliferation marker Ki-67 at 50-60%).   The patient was then referred to Dr. Chryl Heck on 02/16/22 to discuss systemic treatment options. Physical exam performed during this visit  revealed the large right groin mass measuring 6-7 cm in the greatest dimension. No other palpable lymphadenopathy was appreciated. Moving forward, Dr. Chryl Heck recommended a PET/CT to complete staging.   PET scan on 02/20/22 demonstrated the enlarged nodal metastases in the right common iliac, right external iliac, and right inguinal nodal chains as FDG avid. PET otherwise showed no signs to suggest distant nodal metastasis or solid organ metastasis. Other findings of potential clinical significance noted on PET included: nonspecific increased uptake with corresponding mild wall thickening involving the proximal rectum; nonspecific increased uptake within the prostate gland; and an indeterminate exophytic soft tissue nodule arising off the skin surface within the left inguinal region with mild nonspecific FDG uptake.  The patient also met with Dr. Barry Dienes on 02/27/22 who informed the patient that he is likely not a candidate for resection of all of the the inguinal lymph nodes. Dr. Barry Dienes noted that he could possibly have the large uncomfortable lymph node excised if it does not respond to immunotherapy. However, this is dependant of restaging PET findings after he completes treatment.  Given the patient's performance status and his other comorbidities, Dr. Chryl Heck recommended proceeding with immunotherapy consisting of keytruda over chemotherapy. He received his first cycle of keytruda on 03/05/22. During a follow up visit with Dr. Chryl Heck on 03/17/22, the patient report concern for infection in the right inguinal groin mass region. On examination, the mass was noted to slightly larger since his last visit but without signs of infection. Labs the day prior also showed no evidence of leukocytosis. The patient's second cycle of Beryle Flock was delayed due to some miscommunication with the pace program. That being said, he received his second cycle of treatment 2 days ago (04/01/22). During a follow  up with Dr. Chryl Heck on  that date, his tumor was noted to be softer to palpation, suggestive of necrosis/treatment response.  The patient also met with cardiology on 04/01/22 and was cleared to proceed with radiation. The patient is however still at high-risk for surgical complications and will need to undergo repeat risk testing (echo, NST, etc.) if surgery is to be pursued.   Of note: the patient has been meeting with PT through the pace program to address his ambulation issues.   (Other recent imaging includes a renal US performed on 03/05/22 which was unremarkable.)  PREVIOUS RADIATION THERAPY: No  PAST MEDICAL HISTORY:  has a past medical history of Altered mental status, Arthritis, Chronic lower back pain, Chronic systolic congestive heart failure ([place]), Coronary artery disease, Fibromyalgia, Hyperlipidemia, Hypertension, Hypertensive urgency (09/27/2015), Myocardial infarction ([place]) (2015), Stroke ([place]) (2012), and Type II diabetes mellitus ([place]).    PAST SURGICAL HISTORY: Past Surgical History:  Procedure Laterality Date   ANKLE FRACTURE SURGERY Right    CARDIAC CATHETERIZATION N/A 09/30/2015   Procedure: Left Heart Cath and Coronary Angiography;  Surgeon: Burnell Blanks, MD;  Location: [facility];  Service: Cardiovascular;  Laterality: N/A;   CORONARY ANGIOPLASTY WITH STENT PLACEMENT     "total of 3 stents" (12/20/2015)   FRACTIONAL FLOW RESERVE WIRE  05/11/2014   Procedure: FRACTIONAL FLOW RESERVE WIRE;  Surgeon: Burnell Blanks, MD;  Location: [facility] CATH LAB;  Service: Cardiovascular;;   FRACTURE SURGERY     LEFT HEART CATHETERIZATION WITH CORONARY/GRAFT ANGIOGRAM N/A 05/11/2014   Procedure: LEFT HEART CATHETERIZATION WITH Beatrix Fetters;  Surgeon: Burnell Blanks, MD;  Location[facility] CATH LAB;  Service: Cardiovascular;  Laterality: N/A;   PERCUTANEOUS CORONARY STENT INTERVENTION (PCI-S)  05/11/2014   Procedure: PERCUTANEOUS CORONARY STENT INTERVENTION (PCI-S);  Surgeon:  Burnell Blanks, MD;  Location: [facility] CATH LAB;  Service: Cardiovascular;;    FAMILY HISTORY: family history includes Diabetes in his mother.  SOCIAL HISTORY:  reports that he has quit smoking. He has never used smokeless tobacco. He reports that he does not currently use alcohol after a past usage of about 5.0 standard drinks of alcohol per week. He reports that he does not use drugs.  ALLERGIES: Lisinopril and Tape  MEDICATIONS:  Current Outpatient Medications  Medication Sig Dispense Refill   atorvastatin (LIPITOR) 80 MG tablet TAKE 1 TABLET(80 MG) BY MOUTH AT BEDTIME 90 tablet 3   Blood Glucose Monitoring Suppl (ONETOUCH VERIO) w/Device KIT 30 Units by Does not apply route daily. 1 kit 0   carvedilol (COREG) 25 MG tablet TAKE 1 TABLET(25 MG) BY MOUTH TWICE DAILY 180 tablet 0   clopidogrel (PLAVIX) 75 MG tablet TAKE 1 TABLET(75 MG) BY MOUTH DAILY 90 tablet 2   Continuous Blood Gluc Receiver (FREESTYLE LIBRE 14 DAY READER) DEVI 1 Device by Does not apply route every morning. 1 each 3   furosemide (LASIX) 20 MG tablet Take 20 mg by mouth daily.     gabapentin (NEURONTIN) 400 MG capsule TAKE 1 CAPSULE BY MOUTH TWICE DAILY 60 capsule 3   Incontinence Supply Disposable (DEPEND ADJUSTABLE UNDERWEAR LG) MISC 1 application by Does not apply route as needed. 32 each 3   insulin glargine (LANTUS SOLOSTAR) 100 UNIT/ML Solostar Pen Inject 40 Units into the skin daily. (Patient taking differently: Inject 20 Units into the skin daily.) 15 mL 11   Insulin Pen Needle (BD PEN NEEDLE NANO U/F) 32G X 4 MM MISC AS DIRECTED TWICE DAILY 100 each  3   isosorbide mononitrate (IMDUR) 30 MG 24 hr tablet TAKE 1 TABLET(30 MG) BY MOUTH DAILY 90 tablet 1   Lancets (ONETOUCH ULTRASOFT) lancets USE AS DIRECTED 100 each 0   lidocaine-prilocaine (EMLA) cream Apply to affected area once 30 g 3   Multiple Vitamins-Minerals (MULTIVITAMIN GUMMIES ADULT) CHEW Chew 2 each by mouth daily.     nitroGLYCERIN (NITROSTAT) 0.4 MG  SL tablet PLACE 1 TABLET UNDER THE TONGUE EVERY 5 MINS AS NEEDED FOR CHEST PAIN 25 tablet 3   ondansetron (ZOFRAN) 8 MG tablet Take 1 tablet (8 mg total) by mouth every 8 (eight) hours as needed for nausea or vomiting. 30 tablet 1   ONETOUCH VERIO test strip USE TO TEST BLOOD SUGAR 2 TO 3 TIMES A DAY 100 strip 2   prochlorperazine (COMPAZINE) 10 MG tablet Take 1 tablet (10 mg total) by mouth every 6 (six) hours as needed for nausea or vomiting. 30 tablet 1   traMADol (ULTRAM) 50 MG tablet Take 1 tablet (50 mg total) by mouth every 8 (eight) hours as needed. 20 tablet 0   trimethoprim-polymyxin b (POLYTRIM) ophthalmic solution Place 1 drop into both eyes See admin instructions. 4 times a day for 2 days after eye injection (every 4 weeks)     TRULICITY 1.5 NW/2.9FA SOPN ADMINISTER 1.5 MG UNDER THE SKIN 1 TIME A WEEK (Patient taking differently: Inject 1.5 mg into the skin every Friday.) 2 mL 5   No current facility-administered medications for this encounter.    REVIEW OF SYSTEMS:  Notable for that above.   PHYSICAL EXAM:  vitals were not taken for this visit.   General: Alert and oriented, in no acute distress *** HEENT: Head is normocephalic. Extraocular movements are intact. Oropharynx is clear. Neck: Neck is supple, no palpable cervical or supraclavicular lymphadenopathy. Heart: Regular in rate and rhythm with no murmurs, rubs, or gallops. Chest: Clear to auscultation bilaterally, with no rhonchi, wheezes, or rales. Abdomen: Soft, nontender, nondistended, with no rigidity or guarding. Extremities: No cyanosis or edema. Lymphatics: see Neck Exam Skin: No concerning lesions. Musculoskeletal: symmetric strength and muscle tone throughout. Neurologic: Cranial nerves II through XII are grossly intact. No obvious focalities. Speech is fluent. Coordination is intact. Psychiatric: Judgment and insight are intact. Affect is appropriate.   ECOG = ***  0 - Asymptomatic (Fully active, able to  carry on all predisease activities without restriction)  1 - Symptomatic but completely ambulatory (Restricted in physically strenuous activity but ambulatory and able to carry out work of a light or sedentary nature. For example, light housework, office work)  2 - Symptomatic, <50% in bed during the day (Ambulatory and capable of all self care but unable to carry out any work activities. Up and about more than 50% of waking hours)  3 - Symptomatic, >50% in bed, but not bedbound (Capable of only limited self-care, confined to bed or chair 50% or more of waking hours)  4 - Bedbound (Completely disabled. Cannot carry on any self-care. Totally confined to bed or chair)  5 - Death   Eustace Pen MM, Creech RH, Tormey DC, et al. [phone]). "Toxicity and response criteria of the [facility]". [place] Oncol. 5 (6): 649-55   LABORATORY DATA:  Lab Results  Component Value Date   WBC 4.5 04/01/2022   HGB 10.3 (L) 04/01/2022   HCT 30.5 (L) 04/01/2022   MCV 83.1 04/01/2022   PLT 200 04/01/2022   CMP     Component  Value Date/Time   NA 135 04/01/2022 1206   NA 143 08/13/2020 1442   K 4.7 04/01/2022 1206   CL 108 04/01/2022 1206   CO2 25 04/01/2022 1206   GLUCOSE 195 (H) 04/01/2022 1206   BUN 43 (H) 04/01/2022 1206   BUN 19 08/13/2020 1442   CREATININE 1.83 (H) 04/01/2022 1206   CREATININE 1.97 (H) 03/05/2022 1344   CREATININE 1.35 (H) 11/25/2015 1701   CALCIUM 9.3 04/01/2022 1206   PROT 7.0 04/01/2022 1206   PROT 7.2 03/19/2017 1710   ALBUMIN 3.6 04/01/2022 1206   ALBUMIN 4.3 03/19/2017 1710   AST 36 04/01/2022 1206   AST 39 03/05/2022 1344   ALT 28 04/01/2022 1206   ALT 26 03/05/2022 1344   ALKPHOS 93 04/01/2022 1206   BILITOT 0.5 04/01/2022 1206   BILITOT 0.9 03/05/2022 1344   GFRNONAA 39 (L) 04/01/2022 1206   GFRNONAA 36 (L) 03/05/2022 1344   GFRAA 56 (L) 08/13/2020 1442         RADIOGRAPHY: US RENAL  Result Date: 03/05/2022 CLINICAL DATA:  Acute  kidney injury.  rule out hydronephrosis EXAM: RENAL / URINARY TRACT ULTRASOUND COMPLETE COMPARISON:  Head CT 02/20/2022 FINDINGS: Right Kidney: Renal measurements: 10.5 x 5.8 x 5.6 cm = volume: 178 mL. Echogenicity within normal limits. No mass or hydronephrosis visualized. Left Kidney: Renal measurements: 10.2 x 6.4 x 5.9 cm = volume: 200 mL. Echogenicity within normal limits. No mass or hydronephrosis visualized. Urinary bladder: Appears normal for degree of bladder distention. Other: None. IMPRESSION: Unremarkable renal ultrasound. Electronically Signed   By: Iven Finn M.D.   On: 03/05/2022 17:57      IMPRESSION/PLAN:***    On date of service, in total, I spent *** minutes on this encounter. Patient was seen in person.   __________________________________________   Eppie Gibson, MD  This document serves as a record of services personally performed by Eppie Gibson, MD. It was created on her behalf by Roney Mans, a trained medical scribe. The creation of this record is based on the scribe's personal observations and the provider's statements to them. This document has been checked and approved by the attending provider.

## 2022-04-02 NOTE — Telephone Encounter (Signed)
   Antonio Guerra DOB: [birth year] MRN: [id number]   RIDER WAIVER AND RELEASE OF LIABILITY  For purposes of improving physical access to our facilities, [facility] is pleased to partner with third parties to provide [facility] patients or other authorized individuals the option of convenient, on-demand ground transportation services (the [place]") through use of the technology service that enables users to request on-demand ground transportation from independent third-party providers.  By opting to use and accept these [employer], I, the undersigned, hereby agree on behalf of myself, and on behalf of any minor child using the Government social research officer for whom I am the parent or legal guardian, as follows:  Government social research officer provided to me are provided by independent third-party transportation providers who are not [employer] or employees and who are unaffiliated with [employer]. [facility] is neither a transportation carrier nor a common or public carrier. [facility] has no control over the quality or safety of the transportation that occurs as a result of the [employer]. [facility] cannot guarantee that any third-party transportation provider will complete any arranged transportation service. [facility] makes no representation, warranty, or guarantee regarding the reliability, timeliness, quality, safety, suitability, or availability of any of the Transport Services or that they will be error free. I fully understand that traveling by vehicle involves risks and dangers of serious bodily injury, including permanent disability, paralysis, and death. I agree, on behalf of myself and on behalf of any minor child using the Transport Services for whom I am the parent or legal guardian, that the entire risk arising out of my use of the [employer] remains solely with me, to the maximum extent permitted under applicable law. The [employer] are provided "as  is" and "as available." [facility] disclaims all representations and warranties, express, implied or statutory, not expressly set out in these terms, including the implied warranties of merchantability and fitness for a particular purpose. I hereby waive and release [facility], its agents, employees, officers, directors, representatives, insurers, attorneys, assigns, successors, subsidiaries, and affiliates from any and all past, present, or future claims, demands, liabilities, actions, causes of action, or suits of any kind directly or indirectly arising from acceptance and use of the [employer]. I further waive and release  and its affiliates from all present and future liability and responsibility for any injury or death to persons or damages to property caused by or related to the use of the [employer]. I have read this Waiver and Release of Liability, and I understand the terms used in it and their legal significance. This Waiver is freely and voluntarily given with the understanding that my right (as well as the right of any minor child for whom I am the parent or legal guardian using the [employer]) to legal recourse against  in connection with the [employer] is knowingly surrendered in return for use of these services.   I attest that I read the consent document to Antonio Guerra, gave Antonio Guerra the opportunity to ask questions and answered the questions asked (if any). I affirm that Antonio Guerra then provided consent for he's participation in this program.     Antonio Guerra

## 2022-04-03 ENCOUNTER — Encounter (INDEPENDENT_AMBULATORY_CARE_PROVIDER_SITE_OTHER): Payer: Medicare (Managed Care) | Admitting: Ophthalmology

## 2022-04-03 ENCOUNTER — Encounter: Payer: Self-pay | Admitting: Radiation Oncology

## 2022-04-03 ENCOUNTER — Other Ambulatory Visit: Payer: Self-pay

## 2022-04-03 ENCOUNTER — Ambulatory Visit
Admission: RE | Admit: 2022-04-03 | Discharge: 2022-04-03 | Disposition: A | Discharge status: Home / Self Care | Payer: Medicare (Managed Care) | Source: Ambulatory Visit | Attending: Radiation Oncology | Admitting: Radiation Oncology

## 2022-04-03 ENCOUNTER — Other Ambulatory Visit (HOSPITAL_COMMUNITY): Payer: Self-pay | Admitting: Vascular Surgery

## 2022-04-03 VITALS — BP 101/71 | HR 103 | Temp 97.5°F | Resp 20 | Ht 73.0 in | Wt 186.0 lb

## 2022-04-03 DIAGNOSIS — Z7985 Long-term (current) use of injectable non-insulin antidiabetic drugs: Secondary | ICD-10-CM | POA: Diagnosis not present

## 2022-04-03 DIAGNOSIS — C4A9 Merkel cell carcinoma, unspecified: Secondary | ICD-10-CM | POA: Insufficient documentation

## 2022-04-03 DIAGNOSIS — R197 Diarrhea, unspecified: Secondary | ICD-10-CM | POA: Diagnosis not present

## 2022-04-03 DIAGNOSIS — I110 Hypertensive heart disease with heart failure: Secondary | ICD-10-CM | POA: Diagnosis not present

## 2022-04-03 DIAGNOSIS — Z7902 Long term (current) use of antithrombotics/antiplatelets: Secondary | ICD-10-CM | POA: Insufficient documentation

## 2022-04-03 DIAGNOSIS — Z794 Long term (current) use of insulin: Secondary | ICD-10-CM | POA: Insufficient documentation

## 2022-04-03 DIAGNOSIS — Z8673 Personal history of transient ischemic attack (TIA), and cerebral infarction without residual deficits: Secondary | ICD-10-CM | POA: Insufficient documentation

## 2022-04-03 DIAGNOSIS — R338 Other retention of urine: Secondary | ICD-10-CM | POA: Insufficient documentation

## 2022-04-03 DIAGNOSIS — E119 Type 2 diabetes mellitus without complications: Secondary | ICD-10-CM | POA: Diagnosis not present

## 2022-04-03 DIAGNOSIS — I2510 Atherosclerotic heart disease of native coronary artery without angina pectoris: Secondary | ICD-10-CM | POA: Insufficient documentation

## 2022-04-03 DIAGNOSIS — Z79899 Other long term (current) drug therapy: Secondary | ICD-10-CM | POA: Diagnosis not present

## 2022-04-03 DIAGNOSIS — C7B1 Secondary Merkel cell carcinoma: Secondary | ICD-10-CM | POA: Diagnosis present

## 2022-04-03 DIAGNOSIS — Z87891 Personal history of nicotine dependence: Secondary | ICD-10-CM | POA: Insufficient documentation

## 2022-04-03 DIAGNOSIS — Z510 Encounter for antineoplastic radiation therapy: Secondary | ICD-10-CM | POA: Diagnosis present

## 2022-04-03 DIAGNOSIS — I252 Old myocardial infarction: Secondary | ICD-10-CM | POA: Diagnosis not present

## 2022-04-03 NOTE — Progress Notes (Signed)
Location(s) / Histology:  Merkel cell carcinoma nodal presentation   Antonio Guerra presented with symptoms of: PET/CT which showed enlarged FDG avid nodal metastasis identified in the right common iliac, external iliac and right inguinal node chain  PET Scan 02/20/2022 IMPRESSION: Enlarged FDG avid nodal metastases identified within the right common iliac, right external iliac, and right inguinal nodal chains. There are no signs to suggest distant nodal metastasis or solid organ metastasis. Nonspecific increased uptake with corresponding mild wall thickening is noted involving the proximal rectum. Correlation with colon cancer screening is advised. There is also nonspecific increased uptake within the prostate gland. Consider further evaluation with PSA. Indeterminate exophytic soft tissue nodule arising off the skin surface within the left inguinal region with mild nonspecific FDG uptake. Correlation with physical exam findings advised. Aortic Atherosclerosis (ICD10-I70.0). Coronary artery calcifications. Small hiatal hernia. Calcifications identified within the pancreas which may be the sequelae of chronic pancreatitis.  Biopsies revealed:  01/27/2022 FINAL MICROSCOPIC DIAGNOSIS:  A. LYMPH NODE, RIGHT INGUINAL, NEEDLE CORE BIOPSY:  -  Poorly differentiated carcinoma with neuroendocrine features  -  See comment  COMMENT:  The biopsy consists of 4 lymph node cores showing a somewhat monotonous population of lymphoid cells with round nuclei, open chromatin and indistinct cytoplasm.  There is extensive necrosis and apoptotic debris. An initial panel of immunohistochemistry was performed for a lymphoma workup (CD45, Bcl-2 CD20, CD3, CD138, CD30, CD34, CD5, CD10, BCL6, TdT,  PAX5, mum 1, and EBV by in situ hybridization); however, this panel disapproved and adequate in origin.  Additional immunohistochemistry shows the tumor is positive for cytokeratin AE1/3 (dot-like), cytokeratin-20 (dot-like)  chromogranin and synaptophysin but negative for CD99, cytokeratin 7and TTF-1.  The proliferative rate by Ki-67 is 50-60%.  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  Past/Anticipated interventions by medical oncology, if any:  Under care of Dr. Arletha Pili Iruku 04/01/2022 We have discussed about trying immunotherapy since it is likely unresectable at this time based on my discussion with Dr. Barry Dienes. He started cycle 1 of neoadjuvant Keytruda on August 17 and was seen for an interim visit with concern for infection.   He once again missed his second cycle of immunotherapy planned for Friday because of some miscommunication with the pace program.   He is now here for an unplanned visit since the daughter is very worried about tumor progression.   I do not see a significant change compared to my most recent visit however have noticed that the tumor definitely feels softer to palpation with suggest area of necrosis/response to treatment.   At this time we have discussed that it is hard to make a comment if he is responsive to immunotherapy since he only had 1 cycle.  We have encouraged him to proceed with second cycle as scheduled for today followed by consideration for repeat imaging.   He will also follow-up with Dr. Isidore Moos for consideration of neoadjuvant radiation.   We have overall discussed several times that Merkel cell is a very aggressive malignancy and although he may have significant response to treatment, typically there could be relapsed/distant metastasis.   As long as his performance status permits and as long as he does not have much toxicity from treatment, we are willing to continue treatment.  However he has several other comorbidities and has limited PS hence he may not be a candidate for aggressive treatments.   Daughter tells me that they have met with cardiology team who  do not believe he is a good candidate for  surgery.   We are still awaiting formal recommendations. Return to clinic as planned for cycle 3 of treatment.  Weight changes, if any, over the past 6 months:  Reports a slight decrease in appetite, but denies noticeable weight loss Wt Readings from Last 3 Encounters:  04/03/22 186 lb (84.4 kg)  04/01/22 183 lb 8 oz (83.2 kg)  04/01/22 184 lb (83.5 kg)   Recurrent fevers, or drenching night sweats, if any:  Denies  SAFETY ISSUES: Prior radiation? No Pacemaker/ICD? No Possible current pregnancy? N/A Is the patient on methotrexate? No  Current Complaints / other details:  Nothing else of note

## 2022-04-04 ENCOUNTER — Other Ambulatory Visit: Payer: Self-pay

## 2022-04-06 ENCOUNTER — Inpatient Hospital Stay: Payer: Medicare (Managed Care)

## 2022-04-06 ENCOUNTER — Inpatient Hospital Stay: Payer: Medicare (Managed Care) | Admitting: Adult Health

## 2022-04-08 ENCOUNTER — Telehealth (HOSPITAL_COMMUNITY): Payer: Self-pay | Admitting: *Deleted

## 2022-04-08 DIAGNOSIS — Z510 Encounter for antineoplastic radiation therapy: Secondary | ICD-10-CM | POA: Diagnosis not present

## 2022-04-08 NOTE — Telephone Encounter (Signed)
Left message on voicemail per DPR in reference to upcoming appointment scheduled on 04/15/22 at 0800 with detailed instructions given per Myocardial Perfusion Study Information Sheet for the test. LM to arrive 15 minutes early, and that it is imperative to arrive on time for appointment to keep from having the test rescheduled. If you need to cancel or reschedule your appointment, please call the office within 24 hours of your appointment. Failure to do so may result in a cancellation of your appointment, and a $50 no show fee. Phone number given for call back for any questions.  Cayce Paschal, [place]e [place]

## 2022-04-09 ENCOUNTER — Ambulatory Visit (HOSPITAL_COMMUNITY)
Admission: RE | Admit: 2022-04-09 | Discharge: 2022-04-09 | Disposition: A | Discharge status: Home / Self Care | Payer: Medicare (Managed Care) | Source: Ambulatory Visit | Attending: Vascular Surgery | Admitting: Vascular Surgery

## 2022-04-09 DIAGNOSIS — I252 Old myocardial infarction: Secondary | ICD-10-CM | POA: Diagnosis not present

## 2022-04-09 DIAGNOSIS — I2510 Atherosclerotic heart disease of native coronary artery without angina pectoris: Secondary | ICD-10-CM | POA: Diagnosis not present

## 2022-04-09 DIAGNOSIS — Z955 Presence of coronary angioplasty implant and graft: Secondary | ICD-10-CM | POA: Diagnosis not present

## 2022-04-12 LAB — ECHOCARDIOGRAM COMPLETE
Area-P 1/2: 5.46 cm2
S' Lateral: 3.4 cm

## 2022-04-13 ENCOUNTER — Ambulatory Visit: Admission: RE | Admit: 2022-04-13 | Payer: Medicare (Managed Care) | Source: Ambulatory Visit

## 2022-04-13 ENCOUNTER — Other Ambulatory Visit: Payer: Self-pay

## 2022-04-13 ENCOUNTER — Ambulatory Visit
Admission: RE | Admit: 2022-04-13 | Discharge: 2022-04-13 | Disposition: A | Discharge status: Home / Self Care | Payer: Medicare (Managed Care) | Source: Ambulatory Visit | Attending: Radiation Oncology | Admitting: Radiation Oncology

## 2022-04-13 DIAGNOSIS — C4A9 Merkel cell carcinoma, unspecified: Secondary | ICD-10-CM

## 2022-04-13 DIAGNOSIS — Z510 Encounter for antineoplastic radiation therapy: Secondary | ICD-10-CM | POA: Diagnosis not present

## 2022-04-13 LAB — RAD ONC ARIA SESSION SUMMARY
Course Elapsed Days: 0
Plan Fractions Treated to Date: 1
Plan Prescribed Dose Per Fraction: 1.8 Gy
Plan Total Fractions Prescribed: 30
Plan Total Prescribed Dose: 54 Gy
Reference Point Dosage Given to Date: 1.8 Gy
Reference Point Session Dosage Given: 1.8 Gy
Session Number: 1

## 2022-04-13 NOTE — Progress Notes (Signed)
Pt here for patient teaching.  Pt given Radiation and You booklet and skin care instructions.  Reviewed areas of pertinence such as diarrhea, fatigue, sexual and fertility changes, skin changes, urinary and bladder changes, cough, and shortness of breath . Pt able to give teach back of use baby wipes, have Imodium on hand, and drink plenty of water,avoid applying anything to skin within 4 hours of treatment. Pt verbalizes understanding of information given and will contact nursing with any questions or concerns.     [email or web address]

## 2022-04-14 ENCOUNTER — Ambulatory Visit: Payer: Medicare (Managed Care)

## 2022-04-14 ENCOUNTER — Other Ambulatory Visit: Payer: Self-pay | Admitting: Radiation Oncology

## 2022-04-14 ENCOUNTER — Encounter: Payer: Self-pay | Admitting: Radiation Oncology

## 2022-04-14 DIAGNOSIS — C4A9 Merkel cell carcinoma, unspecified: Secondary | ICD-10-CM

## 2022-04-14 NOTE — Progress Notes (Signed)
I spoke with Ms. Truman Hayward - the patient's daughter - today. Mr. Hickam, per her report, had extreme fatigue and an episode of vomiting after his first fraction of radiation yesterday. The patient's daughter is adamant that she does not want him to continue RT.  She stated that she has never seen her father looks so "bad", not even after the death of her mohter.  I have let Dr. Chryl Heck know that she wants her father to continue immunotherapy and is hoping it will work.   I have asked  Dr Chryl Heck to please refer him back to [place] if family/patient wants to reconsider radiation (ie if immunotherapy does not start to work).  I am not 100% sure this is a side effect of radiation therapy but Ms. Truman Hayward was really concerned about how miserable he looked and does not feel comfortable with her father continuing RT right now.  Fortunately he is currently keeping down food and she will keep an eye on him and notify staff if the vomiting resumes or if he shows signs of a fever.  -----------------------------------  Eppie Gibson, MD

## 2022-04-15 ENCOUNTER — Ambulatory Visit: Payer: Medicare (Managed Care)

## 2022-04-15 ENCOUNTER — Other Ambulatory Visit: Payer: Self-pay

## 2022-04-15 ENCOUNTER — Encounter (HOSPITAL_COMMUNITY): Payer: Medicare (Managed Care)

## 2022-04-15 ENCOUNTER — Other Ambulatory Visit (HOSPITAL_COMMUNITY): Payer: Medicare (Managed Care)

## 2022-04-16 ENCOUNTER — Telehealth (HOSPITAL_COMMUNITY): Payer: Self-pay

## 2022-04-16 ENCOUNTER — Ambulatory Visit: Payer: Medicare (Managed Care)

## 2022-04-16 NOTE — Telephone Encounter (Signed)
Detailed instructions left for the patient. His daughter stated that she would have him here for his test. Asked to call back with any questions. S.Bingham Millette EMTP

## 2022-04-17 ENCOUNTER — Ambulatory Visit: Payer: Medicare (Managed Care)

## 2022-04-20 ENCOUNTER — Ambulatory Visit: Payer: Medicare (Managed Care)

## 2022-04-21 ENCOUNTER — Inpatient Hospital Stay (HOSPITAL_BASED_OUTPATIENT_CLINIC_OR_DEPARTMENT_OTHER): Payer: Medicare (Managed Care) | Admitting: Hematology and Oncology

## 2022-04-21 ENCOUNTER — Other Ambulatory Visit: Payer: Self-pay

## 2022-04-21 ENCOUNTER — Inpatient Hospital Stay: Payer: Medicare (Managed Care) | Attending: Adult Health

## 2022-04-21 ENCOUNTER — Ambulatory Visit (HOSPITAL_COMMUNITY): Payer: Medicare (Managed Care) | Attending: Physician Assistant

## 2022-04-21 ENCOUNTER — Encounter: Payer: Self-pay | Admitting: Hematology and Oncology

## 2022-04-21 ENCOUNTER — Inpatient Hospital Stay: Payer: Medicare (Managed Care)

## 2022-04-21 ENCOUNTER — Ambulatory Visit: Payer: Medicare (Managed Care)

## 2022-04-21 VITALS — BP 138/78 | HR 112 | Temp 97.9°F | Resp 16 | Ht 72.0 in | Wt 184.5 lb

## 2022-04-21 VITALS — HR 107

## 2022-04-21 DIAGNOSIS — Z79899 Other long term (current) drug therapy: Secondary | ICD-10-CM | POA: Diagnosis not present

## 2022-04-21 DIAGNOSIS — C7B1 Secondary Merkel cell carcinoma: Secondary | ICD-10-CM | POA: Diagnosis not present

## 2022-04-21 DIAGNOSIS — Z01818 Encounter for other preprocedural examination: Secondary | ICD-10-CM | POA: Insufficient documentation

## 2022-04-21 DIAGNOSIS — C4A59 Merkel cell carcinoma of other part of trunk: Secondary | ICD-10-CM | POA: Diagnosis present

## 2022-04-21 DIAGNOSIS — Z5112 Encounter for antineoplastic immunotherapy: Secondary | ICD-10-CM | POA: Diagnosis present

## 2022-04-21 DIAGNOSIS — C4A9 Merkel cell carcinoma, unspecified: Secondary | ICD-10-CM

## 2022-04-21 DIAGNOSIS — Z87891 Personal history of nicotine dependence: Secondary | ICD-10-CM | POA: Insufficient documentation

## 2022-04-21 DIAGNOSIS — M79606 Pain in leg, unspecified: Secondary | ICD-10-CM | POA: Insufficient documentation

## 2022-04-21 DIAGNOSIS — I255 Ischemic cardiomyopathy: Secondary | ICD-10-CM | POA: Insufficient documentation

## 2022-04-21 DIAGNOSIS — R112 Nausea with vomiting, unspecified: Secondary | ICD-10-CM | POA: Diagnosis not present

## 2022-04-21 LAB — CMP (CANCER CENTER ONLY)
ALT: 18 U/L (ref 0–44)
AST: 30 U/L (ref 15–41)
Albumin: 3.6 g/dL (ref 3.5–5.0)
Alkaline Phosphatase: 97 U/L (ref 38–126)
Anion gap: 3 — ABNORMAL LOW (ref 5–15)
BUN: 28 mg/dL — ABNORMAL HIGH (ref 8–23)
CO2: 25 mmol/L (ref 22–32)
Calcium: 8.9 mg/dL (ref 8.9–10.3)
Chloride: 108 mmol/L (ref 98–111)
Creatinine: 1.37 mg/dL — ABNORMAL HIGH (ref 0.61–1.24)
GFR, Estimated: 56 mL/min — ABNORMAL LOW (ref >60)
Glucose, Bld: 150 mg/dL — ABNORMAL HIGH (ref 70–99)
Potassium: 4.3 mmol/L (ref 3.5–5.1)
Sodium: 136 mmol/L (ref 135–145)
Total Bilirubin: 0.6 mg/dL (ref 0.3–1.2)
Total Protein: 7.2 g/dL (ref 6.5–8.1)

## 2022-04-21 LAB — CBC WITH DIFFERENTIAL (CANCER CENTER ONLY)
Abs Immature Granulocytes: 0.01 10*3/uL (ref 0.00–0.07)
Basophils Absolute: 0 10*3/uL (ref 0.0–0.1)
Basophils Relative: 1 %
Eosinophils Absolute: 0.1 10*3/uL (ref 0.0–0.5)
Eosinophils Relative: 3 %
HCT: 31.1 % — ABNORMAL LOW (ref 39.0–52.0)
Hemoglobin: 10.5 g/dL — ABNORMAL LOW (ref 13.0–17.0)
Immature Granulocytes: 0 %
Lymphocytes Relative: 25 %
Lymphs Abs: 1.1 10*3/uL (ref 0.7–4.0)
MCH: 27.8 pg (ref 26.0–34.0)
MCHC: 33.8 g/dL (ref 30.0–36.0)
MCV: 82.3 fL (ref 80.0–100.0)
Monocytes Absolute: 0.6 10*3/uL (ref 0.1–1.0)
Monocytes Relative: 13 %
Neutro Abs: 2.6 10*3/uL (ref 1.7–7.7)
Neutrophils Relative %: 58 %
Platelet Count: 266 10*3/uL (ref 150–400)
RBC: 3.78 MIL/uL — ABNORMAL LOW (ref 4.22–5.81)
RDW: 15.8 % — ABNORMAL HIGH (ref 11.5–15.5)
WBC Count: 4.5 10*3/uL (ref 4.0–10.5)
nRBC: 0 % (ref 0.0–0.2)

## 2022-04-21 LAB — MYOCARDIAL PERFUSION IMAGING
LV dias vol: 88 mL (ref 62–150)
LV sys vol: 58 mL
Nuc Stress EF: 34 %
Peak HR: 114 {beats}/min
Rest HR: 99 {beats}/min
Rest Nuclear Isotope Dose: 9.6 mCi
SDS: 3
SRS: 0
SSS: 3
ST Depression (mm): 0 mm
Stress Nuclear Isotope Dose: 30.4 mCi
TID: 0.93

## 2022-04-21 LAB — TSH: TSH: 3.603 u[IU]/mL (ref 0.350–4.500)

## 2022-04-21 MED ORDER — SODIUM CHLORIDE 0.9 % IV SOLN
200.0000 mg | Freq: Once | INTRAVENOUS | Status: AC
  Administered 2022-04-21: 200 mg via INTRAVENOUS
  Filled 2022-04-21: qty 200

## 2022-04-21 MED ORDER — REGADENOSON 0.4 MG/5ML IV SOLN
0.4000 mg | Freq: Once | INTRAVENOUS | Status: AC
  Administered 2022-04-21: 0.4 mg via INTRAVENOUS

## 2022-04-21 MED ORDER — SODIUM CHLORIDE 0.9 % IV SOLN: Freq: Once | INTRAVENOUS | Status: AC

## 2022-04-21 MED ORDER — TECHNETIUM TC 99M TETROFOSMIN IV KIT
9.6000 | PACK | Freq: Once | INTRAVENOUS | Status: AC | PRN
  Administered 2022-04-21: 9.6 via INTRAVENOUS

## 2022-04-21 MED ORDER — TECHNETIUM TC 99M TETROFOSMIN IV KIT
30.4000 | PACK | Freq: Once | INTRAVENOUS | Status: AC | PRN
  Administered 2022-04-21: 30.4 via INTRAVENOUS

## 2022-04-21 NOTE — Progress Notes (Signed)
Per Dr. Chryl Heck ok to proceed with tx with HR 107.

## 2022-04-21 NOTE — Assessment & Plan Note (Signed)
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   He had PET/CT which showed enlarged FDG avid nodal metastasis identified in the right common iliac, external iliac and right inguinal node chains.  No signs to suggest distant nodal metastasis or solid organ metastasis.  We have discussed about trying immunotherapy since it is likely unresectable at this time based on my discussion with Dr. Barry Dienes. He started cycle 1 of neoadjuvant Keytruda on August 17 and was seen for an interim visit with concern for infection.  He once again missed his second cycle of immunotherapy planned for Friday because of some miscommunication with the pace program.    He is now here before planned cycle 3 of immunotherapy.  Okay to proceed with immunotherapy as scheduled.  He was also started on radiation however according to the conversation between his daughter and Dr. Isidore Moos, they did not want him to continue radiation since he felt awful after radiation.  He today however tells me that he wants to continue radiation hence I have communicated this to Dr. Isidore Moos.  Dr. Isidore Moos will talk to him as well as his daughter and confirm the plan.  Okay to continue immunotherapy along with radiation if needed.  I will order repeat imaging to assess response after this cycle of immunotherapy

## 2022-04-21 NOTE — Patient Instructions (Signed)
[facility]  Discharge Instructions: Thank you for choosing [facility] to provide your oncology and hematology care.   If you have a lab appointment with the [facility], please go directly to the [facility] and check in at the registration area.   Wear comfortable clothing and clothing appropriate for easy access to any Portacath or PICC line.   We strive to give you quality time with your provider. You may need to reschedule your appointment if you arrive late (15 or more minutes).  Arriving late affects you and other patients whose appointments are after yours.  Also, if you miss three or more appointments without notifying the office, you may be dismissed from the clinic at the provider's discretion.      For prescription refill requests, have your pharmacy contact our office and allow 72 hours for refills to be completed.    Today you received the following chemotherapy and/or immunotherapy agents : Keytruda      To help prevent nausea and vomiting after your treatment, we encourage you to take your nausea medication as directed.  BELOW ARE SYMPTOMS THAT SHOULD BE REPORTED IMMEDIATELY: *FEVER GREATER THAN 100.4 F (38 C) OR HIGHER *CHILLS OR SWEATING *NAUSEA AND VOMITING THAT IS NOT CONTROLLED WITH YOUR NAUSEA MEDICATION *UNUSUAL SHORTNESS OF BREATH *UNUSUAL BRUISING OR BLEEDING *URINARY PROBLEMS (pain or burning when urinating, or frequent urination) *BOWEL PROBLEMS (unusual diarrhea, constipation, pain near the anus) TENDERNESS IN MOUTH AND THROAT WITH OR WITHOUT PRESENCE OF ULCERS (sore throat, sores in mouth, or a toothache) UNUSUAL RASH, SWELLING OR PAIN  UNUSUAL VAGINAL DISCHARGE OR ITCHING   Items with * indicate a potential emergency and should be followed up as soon as possible or go to the Emergency Department if any problems should occur.  Please show the CHEMOTHERAPY ALERT CARD or IMMUNOTHERAPY ALERT CARD at check-in to  the Emergency Department and triage nurse.  Should you have questions after your visit or need to cancel or reschedule your appointment, please contact [facility]  Dept: [phone]  and follow the prompts.  Office hours are 8:00 a.m. to 4:30 p.m. Monday - Friday. Please note that voicemails left after 4:00 p.m. may not be returned until the following business day.  We are closed weekends and major holidays. You have access to a nurse at all times for urgent questions. Please call the main number to the clinic Dept: [phone] and follow the prompts.   For any non-urgent questions, you may also contact your provider using MyChart. We now offer e-Visits for anyone 63 and older to request care online for non-urgent symptoms. For details visit [email or web address].   Also download the MyChart app! Go to the app store, search "MyChart", open the app, select [facility], and log in with your MyChart username and password.  Masks are optional in the cancer centers. If you would like for your care team to wear a mask while they are taking care of you, please let them know. You may have one support person who is at least 70 years old accompany you for your appointments. Pembrolizumab Injection What is this medication? PEMBROLIZUMAB (PEM broe LIZ ue mab) treats some types of cancer. It works by helping your immune system slow or stop the spread of cancer cells. It is a monoclonal antibody. This medicine may be used for other purposes; ask your health care provider or pharmacist if you have questions. COMMON BRAND NAME(S): Keytruda What  should I tell my care team before I take this medication? They need to know if you have any of these conditions: Allogeneic stem cell transplant (uses someone else's stem cells) Autoimmune diseases, such as Crohn disease, ulcerative colitis, lupus History of chest radiation Nervous system problems, such as Guillain-Barre syndrome,  myasthenia gravis Organ transplant An unusual or allergic reaction to pembrolizumab, other medications, foods, dyes, or preservatives Pregnant or trying to get pregnant Breast-feeding How should I use this medication? This medication is injected into a vein. It is given by your care team in a hospital or clinic setting. A special MedGuide will be given to you before each treatment. Be sure to read this information carefully each time. Talk to your care team about the use of this medication in children. While it may be prescribed for children as young as 6 months for selected conditions, precautions do apply. Overdosage: If you think you have taken too much of this medicine contact a poison control center or emergency room at once. NOTE: This medicine is only for you. Do not share this medicine with others. What if I miss a dose? Keep appointments for follow-up doses. It is important not to miss your dose. Call your care team if you are unable to keep an appointment. What may interact with this medication? Interactions have not been studied. This list may not describe all possible interactions. Give your health care provider a list of all the medicines, herbs, non-prescription drugs, or dietary supplements you use. Also tell them if you smoke, drink alcohol, or use illegal drugs. Some items may interact with your medicine. What should I watch for while using this medication? Your condition will be monitored carefully while you are receiving this medication. You may need blood work while taking this medication. This medication may cause serious skin reactions. They can happen weeks to months after starting the medication. Contact your care team right away if you notice fevers or flu-like symptoms with a rash. The rash may be red or purple and then turn into blisters or peeling of the skin. You may also notice a red rash with swelling of the face, lips, or lymph nodes in your neck or under your  arms. Tell your care team right away if you have any change in your eyesight. Talk to your care team if you may be pregnant. Serious birth defects can occur if you take this medication during pregnancy and for 4 months after the last dose. You will need a negative pregnancy test before starting this medication. Contraception is recommended while taking this medication and for 4 months after the last dose. Your care team can help you find the option that works for you. Do not breastfeed while taking this medication and for 4 months after the last dose. What side effects may I notice from receiving this medication? Side effects that you should report to your care team as soon as possible: Allergic reactions--skin rash, itching, hives, swelling of the face, lips, tongue, or throat Dry cough, shortness of breath or trouble breathing Eye pain, redness, irritation, or discharge with blurry or decreased vision Heart muscle inflammation--unusual weakness or fatigue, shortness of breath, chest pain, fast or irregular heartbeat, dizziness, swelling of the ankles, feet, or hands Hormone gland problems--headache, sensitivity to light, unusual weakness or fatigue, dizziness, fast or irregular heartbeat, increased sensitivity to cold or heat, excessive sweating, constipation, hair loss, increased thirst or amount of urine, tremors or shaking, irritability Infusion reactions--chest pain, shortness of breath   or trouble breathing, feeling faint or lightheaded Kidney injury (glomerulonephritis)--decrease in the amount of urine, red or dark brown urine, foamy or bubbly urine, swelling of the ankles, hands, or feet Liver injury--right upper belly pain, loss of appetite, nausea, light-colored stool, dark yellow or brown urine, yellowing skin or eyes, unusual weakness or fatigue Pain, tingling, or numbness in the hands or feet, muscle weakness, change in vision, confusion or trouble speaking, loss of balance or  coordination, trouble walking, seizures Rash, fever, and swollen lymph nodes Redness, blistering, peeling, or loosening of the skin, including inside the mouth Sudden or severe stomach pain, bloody diarrhea, fever, nausea, vomiting Side effects that usually do not require medical attention (report to your care team if they continue or are bothersome): Bone, joint, or muscle pain Diarrhea Fatigue Loss of appetite Nausea Skin rash This list may not describe all possible side effects. Call your doctor for medical advice about side effects. You may report side effects to FDA at 1-800-FDA-1088. Where should I keep my medication? This medication is given in a hospital or clinic. It will not be stored at home. NOTE: This sheet is a summary. It may not cover all possible information. If you have questions about this medicine, talk to your doctor, pharmacist, or health care provider.  2023 Elsevier/Gold Standard (2021-10-27 00:00:00)

## 2022-04-21 NOTE — Progress Notes (Signed)
[place] NOTE  Patient Care Team: [name], MD as PCP - General (Family Medicine) Jerline Pain, MD as PCP - Cardiology (Cardiology) [facility] 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   He had PET/CT which showed enlarged FDG avid nodal metastasis identified in the right common iliac, external iliac and right inguinal node chains.  No signs to suggest distant nodal metastasis or solid organ metastasis.  We have discussed about trying immunotherapy since it is likely unresectable at this time based on my discussion with Dr. Barry Dienes. He started cycle 1 of neoadjuvant Keytruda on August 17 and was seen for an interim visit with concern for infection.  He once again missed his second cycle of immunotherapy planned for Friday because of some miscommunication with the pace program.    He is now here before planned cycle 3 of immunotherapy.  Okay to proceed with immunotherapy as scheduled.  He was also started on radiation however according to the conversation between his daughter and Dr. Isidore Moos, they did not want him to continue radiation since he felt awful after radiation.  He today however tells me that he wants to continue radiation hence I have communicated this to Dr. Isidore Moos.  Dr. Isidore Moos will talk to him as well as his daughter and confirm the plan.  Okay to continue immunotherapy along with radiation if needed.  I will order repeat imaging to assess response after this cycle of immunotherapy  Orders Placed This Encounter  Procedures   NM PET Image Restag (PS) Skull Base To  Thigh    Standing Status:   Future    Standing Expiration Date:   04/21/2023    Order Specific Question:   If indicated for the ordered procedure, I authorize the administration of a radiopharmaceutical per Radiology protocol    Answer:   Yes    Order Specific Question:   Preferred imaging location?    Answer:   [place]   CBC with Differential (Cancer Center Only)    Standing Status:   Future    Standing Expiration Date:   05/13/2023   CMP (Cancer Center only)    Standing Status:   Future    Standing Expiration Date:   05/13/2023   T4    Standing Status:   Future    Standing Expiration Date:   05/13/2023   TSH    Standing Status:   Future    Standing Expiration Date:   05/13/2023    HISTORY OF PRESENTING ILLNESS:  Antonio Guerra 71 y.o. male is here because of merkel cell carcinoma  This is a 71 year old male patient who was evaluated for right groin pain, questionable boil or hernia.  CT pelvis demonstrated 4.7 x 5.2 x 6.6 cm nodular lobulated mass at the right inguinal region recommend a needle biopsy.  Ultrasound-guided core biopsy of the right inguinal mass demonstrated poorly differentiated carcinoma with neuroendocrine features suggestive of a Merkel cell carcinoma. PET CT 02/20/2022 Enlarged FDG avid nodal metastases identified within the right common iliac, right external iliac, and right inguinal nodal chains.There are no signs to suggest distant nodal metastasis or solid organ metastasis. Since he had  locally advanced disease, we discussed about neoadjuvant immunotherapy based on some small group institutional experiences He is now post 2 cycles of immunotherapy  Interval history  Antonio Guerra is here for C3 of immunotherapy. He is doing ok since his last visit. He today tells me that he wants to proceed with radiation.  He tells me that there is some kind of misunderstanding, he only had 1 episode of nausea and vomiting and he would rather want to proceed with radiation as  well as immunotherapy.  He also reports uncontrolled pain in the right groin radiating down his leg.  He has been taking tramadol every 8 hours but this does not control the pain very well.  Other than this he denies any change in breathing.  No change in bowel habits or urinary habits.  No other complaints.  He was alone at his appointment today. Rest of the pertinent 10 point ROS reviewed and negative  MEDICAL HISTORY:  Past Medical History:  Diagnosis Date   Altered mental status    Arthritis    "all over" (12/20/2015)   Chronic lower back pain    Chronic systolic congestive heart failure ([place])    a. 04/2014 Echo: EF 20-25%.   Coronary artery disease    a. 2011 MI x 2 with PCI: stent x 2 (LAD and RI) @ [facility] in [place], [place];  b. 04/2014 Cath/PCI: LM 10-20, LAD 40p, 5m, 78m ISR(3.5x38 Xience DES), 50apical, LCX 20 diffuse, RI 50p, 4m ISR, RCA 40-48m, 30d.   Fibromyalgia    Hyperlipidemia    Hypertension    Hypertensive urgency 09/27/2015   Myocardial infarction [facility]) 2015   Stroke [facility]) 2012   "right hand weaker since" (12/20/2015)   Type II diabetes mellitus ([place])     SURGICAL HISTORY: Past Surgical History:  Procedure Laterality Date   ANKLE FRACTURE SURGERY Right    CARDIAC CATHETERIZATION N/A 09/30/2015   Procedure: Left Heart Cath and Coronary Angiography;  Surgeon: Burnell Blanks, MD;  Location: [facility] CV LAB;  Service: Cardiovascular;  Laterality: N/A;   CORONARY ANGIOPLASTY WITH STENT PLACEMENT     "total of 3 stents" (12/20/2015)   FRACTIONAL FLOW RESERVE WIRE  05/11/2014   Procedure: FRACTIONAL FLOW RESERVE WIRE;  Surgeon: Burnell Blanks, MD;  Location: [facility] CATH LAB;  Service: Cardiovascular;;   FRACTURE SURGERY     LEFT HEART CATHETERIZATION WITH CORONARY/GRAFT ANGIOGRAM N/A 05/11/2014   Procedure: LEFT HEART CATHETERIZATION WITH Beatrix Fetters;  Surgeon: Burnell Blanks, MD;  Location: [facility] CATH LAB;  Service: Cardiovascular;   Laterality: N/A;   PERCUTANEOUS CORONARY STENT INTERVENTION (PCI-S)  05/11/2014   Procedure: PERCUTANEOUS CORONARY STENT INTERVENTION (PCI-S);  Surgeon: Burnell Blanks, MD;  Location: [facility] CATH LAB;  Service: Cardiovascular;;    SOCIAL HISTORY: Social History   Socioeconomic History   Marital status: Married    Spouse name: Not on file   Number of children: Not on file   Years of education: Not on file   Highest education level: Not on file  Occupational History   Occupation: retired   Tobacco Use   Smoking status: Former   Smokeless tobacco: Never   Tobacco comments:    12/20/2015 "smoked cigarettes 30-70yr ago"  Vaping Use   Vaping Use: Never used  Substance and Sexual Activity   Alcohol use: Not Currently    Alcohol/week: 5.0 standard drinks of alcohol    Types: 5 Cans of beer per week    Comment: stopped 4  yrs ago    Drug use: No   Sexual activity: Not Currently    Partners: Female  Other Topics Concern   Not on file  Social History Narrative   Current Social History        Who lives at home: lives with daughter [name] 24-Feb-2021    Who would speak for you about health care matters: [name] 2021/02/24    Transportation: Has [employer] and daughter takes to appointments 02/24/21   Current Stressors: wife and daughter passed away in 2020/09/29 02/24/2021   Work / Education:  worked in the [employer] and with [employer] 2021-02-24   Other: limited mobility uses cane and walker and memory decline ( per daughter)  02-24-21                                                                                                       Social Determinants of Health   Financial Resource Strain: Low Risk  (03/11/2021)   Overall Financial Resource Strain (CARDIA)    Difficulty of Paying Living Expenses: Not hard at all  Food Insecurity: No Food Insecurity (03/11/2021)   Hunger Vital Sign    Worried About Running Out of Food in the Last Year: Never true    [place] in the Last  Year: Never true  Transportation Needs: No Transportation Needs (03/11/2021)   PRAPARE - Hydrologist (Medical): No    Lack of Transportation (Non-Medical): No  Physical Activity: Inactive (03/11/2021)   Exercise Vital Sign    Days of Exercise per Week: 0 days    Minutes of Exercise per Session: 0 min  Stress: Stress Concern Present (03/11/2021)   [place]    Feeling of Stress : Rather much  Social Connections: Socially Isolated (03/11/2021)   Social Connection and Isolation Panel [NHANES]    Frequency of Communication with Friends and Family: Once a week    Frequency of Social Gatherings with Friends and Family: Once a week    Attends Religious Services: Never    Marine scientist or Organizations: No    Attends Archivist Meetings: Never    Marital Status: Widowed  Intimate Partner Violence: Not At Risk (03/11/2021)   Humiliation, Afraid, Rape, and Kick questionnaire    Fear of Current or Ex-Partner: No    Emotionally Abused: No    Physically Abused: No    Sexually Abused: No    FAMILY HISTORY: Family History  Problem Relation Age of Onset   Diabetes Mother    CAD Neg Hx    Stomach cancer Neg Hx    Colon cancer Neg Hx    Esophageal cancer Neg Hx    Pancreatic cancer Neg Hx     ALLERGIES:  is allergic to lisinopril and tape.  MEDICATIONS:  Current Outpatient Medications  Medication Sig Dispense Refill   atorvastatin (LIPITOR) 80 MG tablet TAKE 1 TABLET(80 MG) BY MOUTH AT BEDTIME 90 tablet 3   Blood Glucose Monitoring Suppl (ONETOUCH VERIO) w/Device  KIT 30 Units by Does not apply route daily. 1 kit 0   carvedilol (COREG) 25 MG tablet TAKE 1 TABLET(25 MG) BY MOUTH TWICE DAILY 180 tablet 0   clopidogrel (PLAVIX) 75 MG tablet TAKE 1 TABLET(75 MG) BY MOUTH DAILY 90 tablet 2   Continuous Blood Gluc Receiver (FREESTYLE LIBRE 14 DAY READER) DEVI 1 Device by Does not apply  route every morning. 1 each 3   furosemide (LASIX) 20 MG tablet Take 20 mg by mouth daily.     gabapentin (NEURONTIN) 400 MG capsule TAKE 1 CAPSULE BY MOUTH TWICE DAILY 60 capsule 3   Incontinence Supply Disposable (DEPEND ADJUSTABLE UNDERWEAR LG) MISC 1 application by Does not apply route as needed. 32 each 3   insulin glargine (LANTUS SOLOSTAR) 100 UNIT/ML Solostar Pen Inject 40 Units into the skin daily. (Patient taking differently: Inject 20 Units into the skin daily.) 15 mL 11   Insulin Pen Needle (BD PEN NEEDLE NANO U/F) 32G X 4 MM MISC AS DIRECTED TWICE DAILY 100 each 3   isosorbide mononitrate (IMDUR) 30 MG 24 hr tablet TAKE 1 TABLET(30 MG) BY MOUTH DAILY 90 tablet 1   Lancets (ONETOUCH ULTRASOFT) lancets USE AS DIRECTED 100 each 0   lidocaine-prilocaine (EMLA) cream Apply to affected area once 30 g 3   Multiple Vitamins-Minerals (MULTIVITAMIN GUMMIES ADULT) CHEW Chew 2 each by mouth daily.     nitroGLYCERIN (NITROSTAT) 0.4 MG SL tablet PLACE 1 TABLET UNDER THE TONGUE EVERY 5 MINS AS NEEDED FOR CHEST PAIN 25 tablet 3   ondansetron (ZOFRAN) 8 MG tablet Take 1 tablet (8 mg total) by mouth every 8 (eight) hours as needed for nausea or vomiting. 30 tablet 1   ONETOUCH VERIO test strip USE TO TEST BLOOD SUGAR 2 TO 3 TIMES A DAY 100 strip 2   prochlorperazine (COMPAZINE) 10 MG tablet Take 1 tablet (10 mg total) by mouth every 6 (six) hours as needed for nausea or vomiting. 30 tablet 1   traMADol (ULTRAM) 50 MG tablet Take 1 tablet (50 mg total) by mouth every 8 (eight) hours as needed. 20 tablet 0   trimethoprim-polymyxin b (POLYTRIM) ophthalmic solution Place 1 drop into both eyes See admin instructions. 4 times a day for 2 days after eye injection (every 4 weeks)     TRULICITY 1.5 DH/7.8XB SOPN ADMINISTER 1.5 MG UNDER THE SKIN 1 TIME A WEEK (Patient taking differently: Inject 1.5 mg into the skin every Friday.) 2 mL 5   No current facility-administered medications for this visit.      PHYSICAL EXAMINATION: ECOG PERFORMANCE STATUS: 0 - Asymptomatic  Vitals:   04/21/22 1424  BP: 138/78  Pulse: (!) 112  Resp: 16  Temp: 97.9 F (36.6 C)  SpO2: 99%   Filed Weights   04/21/22 1424  Weight: 184 lb 8 oz (83.7 kg)    Physical Exam Constitutional:      Appearance: He is ill-appearing (He appears chronically ill, no acute distress).  Cardiovascular:     Rate and Rhythm: Normal rate and regular rhythm.     Pulses: Normal pulses.     Heart sounds: Normal heart sounds.  Abdominal:     Comments: Large mass in the right groin smaller than last visit. It is softer to touch suggesting tumor response.  Musculoskeletal:        General: No swelling.     Cervical back: Normal range of motion and neck supple. No rigidity.  Lymphadenopathy:     Cervical: No  cervical adenopathy.  Neurological:     Mental Status: He is alert.     LABORATORY DATA:  I have reviewed the data as listed Lab Results  Component Value Date   WBC 4.5 04/21/2022   HGB 10.5 (L) 04/21/2022   HCT 31.1 (L) 04/21/2022   MCV 82.3 04/21/2022   PLT 266 04/21/2022     Chemistry      Component Value Date/Time   NA 136 04/21/2022 1406   NA 143 08/13/2020 1442   K 4.3 04/21/2022 1406   CL 108 04/21/2022 1406   CO2 25 04/21/2022 1406   BUN 28 (H) 04/21/2022 1406   BUN 19 08/13/2020 1442   CREATININE 1.37 (H) 04/21/2022 1406   CREATININE 1.35 (H) 11/25/2015 1701      Component Value Date/Time   CALCIUM 8.9 04/21/2022 1406   ALKPHOS 97 04/21/2022 1406   AST 30 04/21/2022 1406   ALT 18 04/21/2022 1406   BILITOT 0.6 04/21/2022 1406       RADIOGRAPHIC STUDIES: I have personally reviewed the radiological images as listed and agreed with the findings in the report. MYOCARDIAL PERFUSION IMAGING  Result Date: 04/21/2022   Findings are consistent with no prior ischemia and no prior myocardial infarction. The study is high risk. Based on estimated EF   No ST deviation was noted.   There is  no evidence of ischemia. There is no evidence of infarction.   Left ventricular function is abnormal. Global function is severely reduced. Nuclear stress EF: 34 %. The left ventricular ejection fraction is moderately decreased (30-44%). End diastolic cavity size is normal. End systolic cavity size is mildly enlarged.   Prior study not available for comparison. Normal perfusion Global hypokinesis abnormal septal motion estimated EF 34% Suggest echo/MRI correlation   ECHOCARDIOGRAM COMPLETE  Result Date: 04/12/2022    ECHOCARDIOGRAM REPORT   Patient Name:   Antonio Guerra Date of Exam: 04/09/2022 Medical Rec #:  [id number]     Height:       73.0 in Accession #:    [id number]    Weight:       186.0 lb Date of Birth:  [birth year]    BSA:          2.086 m Patient Age:    20 years      BP:           106/61 mmHg Patient Gender: M             HR:           111 bpm. Exam Location:  Outpatient Procedure: 2D Echo, 3D Echo, Cardiac Doppler and Color Doppler                                MODIFIED REPORT:    This report was modified by Cherlynn Kaiser MD on 04/12/2022 due to report                                    revision.  Indications:     I25.10 CAD  History:         Patient has prior history of Echocardiogram examinations, most                  recent 09/29/2015. Previous Myocardial Infarction and CAD,  Signs/Symptoms:Edema and Fatigue; Risk Factors:Hypertension,                  Diabetes, Dyslipidemia and Former Smoker. Merkel Cell Cancer-                  Nodal, Prior EF 15-20%.  Sonographer:     Deliah Boston RDCS Referring Phys:  [place] Diagnosing Phys: Cherlynn Kaiser MD  Sonographer Comments: Technically difficult study due to poor echo windows. IMPRESSIONS  1. Left ventricular ejection fraction, by estimation, is 30 to 35%. The left ventricle has moderately decreased function. The left ventricle demonstrates regional wall motion abnormalities (see scoring diagram/findings for  description). There is mild left ventricular hypertrophy. Left ventricular diastolic parameters are indeterminate.  2. Right ventricular systolic function is mildly reduced. The right ventricular size is normal. Tricuspid regurgitation signal is inadequate for assessing PA pressure.  3. A small pericardial effusion is present.  4. The mitral valve is grossly normal. Trivial mitral valve regurgitation. No evidence of mitral stenosis.  5. The aortic valve was not well visualized. There is mild calcification of the aortic valve. Aortic valve regurgitation is not visualized. No aortic stenosis is present.  6. The inferior vena cava is normal in size with greater than 50% respiratory variability, suggesting right atrial pressure of 3 mmHg. FINDINGS  Left Ventricle: Left ventricular ejection fraction, by estimation, is 30 to 35%. The left ventricle has moderately decreased function. The left ventricle demonstrates regional wall motion abnormalities. 3D left ventricular ejection fraction analysis performed but not reported based on interpreter judgement due to suboptimal tracking. The left ventricular internal cavity size was normal in size. There is mild left ventricular hypertrophy. Abnormal (paradoxical) septal motion, consistent with left bundle branch block. Left ventricular diastolic parameters are indeterminate.  LV Wall Scoring: The entire septum is akinetic. Right Ventricle: The right ventricular size is normal. No increase in right ventricular wall thickness. Right ventricular systolic function is mildly reduced. Tricuspid regurgitation signal is inadequate for assessing PA pressure. Left Atrium: Left atrial size was normal in size. Right Atrium: Right atrial size was normal in size. Pericardium: A small pericardial effusion is present. Mitral Valve: The mitral valve is grossly normal. Trivial mitral valve regurgitation. No evidence of mitral valve stenosis. Tricuspid Valve: The tricuspid valve is normal in  structure. Tricuspid valve regurgitation is trivial. No evidence of tricuspid stenosis. Aortic Valve: The aortic valve was not well visualized. There is mild calcification of the aortic valve. Aortic valve regurgitation is not visualized. No aortic stenosis is present. Pulmonic Valve: The pulmonic valve was normal in structure. Pulmonic valve regurgitation is not visualized. No evidence of pulmonic stenosis. Aorta: The aortic root is normal in size and structure. Venous: The inferior vena cava is normal in size with greater than 50% respiratory variability, suggesting right atrial pressure of 3 mmHg. IAS/Shunts: No atrial level shunt detected by color flow Doppler.  LEFT VENTRICLE PLAX 2D LVIDd:         3.70 cm   Diastology LVIDs:         3.40 cm   LV e' medial:    9.03 cm/s LV PW:         1.10 cm   LV E/e' medial:  12.2 LV IVS:        0.70 cm   LV e' lateral:   8.05 cm/s LVOT diam:     2.40 cm   LV E/e' lateral: 13.7 LV SV:  47 LV SV Index:   22 LVOT Area:     4.52 cm                           3D Volume EF:                          3D EF:        63 %                          LV EDV:       149 ml                          LV ESV:       55 ml                          LV SV:        94 ml RIGHT VENTRICLE RV Basal diam:  2.40 cm RV S prime:     13.50 cm/s LEFT ATRIUM             Index        RIGHT ATRIUM           Index LA diam:        3.30 cm 1.58 cm/m   RA Area:     15.70 cm LA Vol (A2C):   49.8 ml 23.87 ml/m  RA Volume:   39.10 ml  18.74 ml/m LA Vol (A4C):   35.1 ml 16.82 ml/m LA Biplane Vol: 42.1 ml 20.18 ml/m  AORTIC VALVE LVOT Vmax:   65.75 cm/s LVOT Vmean:  44.200 cm/s LVOT VTI:    0.103 m  AORTA Ao Root diam: 3.40 cm Ao Asc diam:  3.20 cm MITRAL VALVE MV Area (PHT)  cm          SHUNTS MV Decel Time: 139 msec     Systemic VTI:  0.10 m MV E velocity: 110.00 cm/s  Systemic Diam: 2.40 cm Cherlynn Kaiser MD Electronically signed by Cherlynn Kaiser MD Signature Date/Time: 04/12/2022/9:56:45 AM    Final  (Updated)     All questions were answered. The patient knows to call the clinic with any problems, questions or concerns. I spent 30 minutes in the care of this patient including H and P, review of records, counseling and coordination of care. Repeat PET imaging ordered for after cycle 3    Benay Pike, MD 04/21/2022 4:40 PM

## 2022-04-22 ENCOUNTER — Telehealth: Payer: Self-pay | Admitting: *Deleted

## 2022-04-22 ENCOUNTER — Ambulatory Visit: Payer: Medicare (Managed Care) | Admitting: Physician Assistant

## 2022-04-22 ENCOUNTER — Ambulatory Visit: Payer: Medicare (Managed Care)

## 2022-04-22 ENCOUNTER — Encounter: Payer: Self-pay | Admitting: Hematology and Oncology

## 2022-04-22 ENCOUNTER — Telehealth: Payer: Self-pay | Admitting: Radiation Oncology

## 2022-04-22 NOTE — Telephone Encounter (Signed)
10/4 @ 9:06 spoke to patient's daughter willing to take f/u appt for her father after check her schedule, but would like to make sure his level of care will be changed.  She will call back to sch f/u with Dr. Isidore Moos.  Waiting on call back.

## 2022-04-22 NOTE — Telephone Encounter (Signed)
This RN spoke with the patient's daughter - Ms Truman Hayward- per her concern with tumor status.  This RN informed noted mild improvement in tumor with it being slightly more pliable and not as hardened. "Ok so the chemo is working " This Therapist, sports stated ideally Dr Chryl Heck feels he could see much greater improvement with radiation and inquired as to what concerns there is with doing radiation and why he stopped.  Ms Truman Hayward stated that her father would come home from radiation "completely exhausted and unable to do anything" " He would state he did not want to go and then would not participate with PACE appropriately "  " I just do not see that he can do the radiation and not have more care in the home " - she is currently working and unable provide needed care.  She states she feels he could do "2 days of radiation a week " but not 5.  She stated " if the chemo is working then I want to continue it because he doesn't cause as much side effects and he is able to manage"  She was made aware that MD is ordering restaging scans as well.

## 2022-04-22 NOTE — Progress Notes (Unsigned)
Office Visit    Patient Name: Antonio Guerra Date of Encounter: 04/22/2022  Primary Care Provider:  Jethro Bastos, MD Primary Cardiologist:  Donato Schultz, MD Primary Electrophysiologist: None  Chief Complaint    Antonio Guerra is a 71 y.o. 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  Past Medical History    Past Medical History:  Diagnosis Date   Altered mental status    Arthritis    "all over" (12/20/2015)   Chronic lower back pain    Chronic systolic congestive heart failure (HCC)    a. 04/2014 Echo: EF 20-25%.   Coronary artery disease    a. 2011 MI x 2 with PCI: stent x 2 (LAD and RI) @ [facility] in [place], [place];  b. 04/2014 Cath/PCI: LM 10-20, LAD 40p, 18m, 40m ISR(3.5x38 Xience DES), 50apical, LCX 20 diffuse, RI 50p, 76m ISR, RCA 40-61m, 30d.   Fibromyalgia    Hyperlipidemia    Hypertension    Hypertensive urgency 09/27/2015   Myocardial infarction [facility]) 2015   Stroke [facility]) 2012   "right hand weaker since" (12/20/2015)   Type II diabetes mellitus (HCC)    Past Surgical History:  Procedure Laterality Date   ANKLE FRACTURE SURGERY Right    CARDIAC CATHETERIZATION N/A 09/30/2015   Procedure: Left Heart Cath and Coronary Angiography;  Surgeon: Kathleene Hazel, MD;  Location: [facility] INVASIVE CV LAB;  Service: Cardiovascular;  Laterality: N/A;   CORONARY ANGIOPLASTY WITH STENT PLACEMENT     "total of 3 stents" (12/20/2015)   FRACTIONAL FLOW RESERVE WIRE  05/11/2014   Procedure: FRACTIONAL FLOW RESERVE WIRE;  Surgeon: Kathleene Hazel, MD;  Location: [facility] CATH LAB;  Service: Cardiovascular;;   FRACTURE SURGERY     LEFT HEART CATHETERIZATION WITH CORONARY/GRAFT ANGIOGRAM N/A 05/11/2014   Procedure: LEFT HEART CATHETERIZATION WITH  Isabel Caprice;  Surgeon: Kathleene Hazel, MD;  Location: [facility] CATH LAB;  Service: Cardiovascular;  Laterality: N/A;   PERCUTANEOUS CORONARY STENT INTERVENTION (PCI-S)  05/11/2014   Procedure: PERCUTANEOUS CORONARY STENT INTERVENTION (PCI-S);  Surgeon: Kathleene Hazel, MD;  Location: [facility] CATH LAB;  Service: Cardiovascular;;    Allergies  Allergies  Allergen Reactions   Lisinopril Other (See Comments)    Caused pt to have chest pains.    Tape Itching and Rash    History of Present Illness    Antonio Guerra  is a 71 year old male with the above mention past medical history who presents today for follow-up of recent restratification and stress test for surgical clearance.  [name] was initially seen by [name] in 2015 for complaint of chest pain.  Patient had previous PCI 2 to his LAD completed in 2011 in [place], [place].  He subsequently underwent repeat cath in 2015 for severe stent restenosis of the mid LAD.  He completed most recent LHC in 2017 that revealed stable stents with minimal restenosis.  He was lost to follow-up until 2021 when he was seen by [name] for evaluation of angina.  He was also found to be tachycardic with T wave inversions compatible to possible ischemia.  Patient had medication noncompliance as a contributing factor to stable angina.  Repeat echo was ordered but never completed by the patient.  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  Patient was considered high risk and further restratification with stress test and repeat echo.  Stress test completed revealing no ST deviation and normal perfusion with global kinesis and abnormal septal motion.  There was no evidence of ischemia or infarct.  2D echo was completed and revealed EF of 30-35% with left ventricle wall motion abnormalities LVH small pericardial effusion, and normal aortic root structure.  [name]  presents today for follow-up of recent echo and stress test results alone.  Since last being seen in the office patient reports that he has no new cardiac complaints.  During our visit I contacted his daughter [name] and provided her an update regarding his surgical clearance.  His daughter reported that his tumor is responding well to radiation therapy and the tumor is actually softening and shrinking.  During our call we also reviewed the results of his [place] and Myoview results.  Patient's daughter was also advised that Plavix can be held for surgical procedure as indicated by requesting party.  I will send clearance to appropriate requesting parties.  Patient's blood pressure today was 118/70 and heart rate was 113. Patient denies chest pain, palpitations, dyspnea, PND, orthopnea, nausea, vomiting, dizziness, syncope, edema, weight gain, or early satiety.  Home Medications    Current Outpatient Medications  Medication Sig Dispense Refill   atorvastatin (LIPITOR) 80 MG tablet TAKE 1 TABLET(80 MG) BY MOUTH AT BEDTIME 90 tablet 3   Blood Glucose Monitoring Suppl (ONETOUCH VERIO) w/Device KIT 30 Units by Does not apply route daily. 1 kit 0   carvedilol (COREG) 25 MG tablet TAKE 1 TABLET(25 MG) BY MOUTH TWICE DAILY 180 tablet 0   clopidogrel (PLAVIX) 75 MG tablet TAKE 1 TABLET(75 MG) BY MOUTH DAILY 90 tablet 2   Continuous Blood Gluc Receiver (FREESTYLE LIBRE 14 DAY READER) DEVI 1 Device by Does not apply route every morning. 1 each 3   furosemide (LASIX) 20 MG tablet Take 20 mg by mouth daily.     gabapentin (NEURONTIN) 400 MG capsule TAKE 1 CAPSULE BY MOUTH TWICE DAILY 60 capsule 3   Incontinence Supply Disposable (DEPEND ADJUSTABLE UNDERWEAR LG) MISC 1 application by Does not apply route as needed. 32 each 3   insulin glargine (LANTUS SOLOSTAR) 100 UNIT/ML Solostar Pen Inject 40 Units into the skin daily. (Patient taking differently: Inject 20 Units into the skin daily.) 15 mL 11   Insulin  Pen Needle (BD PEN NEEDLE NANO U/F) 32G X 4 MM MISC AS DIRECTED TWICE DAILY 100 each 3   isosorbide mononitrate (IMDUR) 30 MG 24 hr tablet TAKE 1 TABLET(30 MG) BY MOUTH DAILY 90 tablet 1   Lancets (ONETOUCH ULTRASOFT) lancets USE AS DIRECTED 100 each 0   lidocaine-prilocaine (EMLA) cream Apply to affected area once 30 g 3   Multiple Vitamins-Minerals (MULTIVITAMIN GUMMIES ADULT) CHEW Chew 2 each by mouth daily.     nitroGLYCERIN (NITROSTAT) 0.4 MG SL tablet PLACE 1 TABLET UNDER THE TONGUE EVERY 5 MINS AS NEEDED FOR CHEST PAIN 25 tablet 3   ondansetron (ZOFRAN) 8 MG tablet Take 1 tablet (8 mg total) by mouth every 8 (eight) hours as needed for nausea or vomiting. 30 tablet 1   ONETOUCH VERIO test strip USE TO TEST BLOOD SUGAR 2 TO 3 TIMES A DAY 100 strip 2   prochlorperazine (COMPAZINE) 10 MG tablet Take 1 tablet (10 mg total) by mouth  every 6 (six) hours as needed for nausea or vomiting. 30 tablet 1   traMADol (ULTRAM) 50 MG tablet Take 1 tablet (50 mg total) by mouth every 8 (eight) hours as needed. 20 tablet 0   trimethoprim-polymyxin b (POLYTRIM) ophthalmic solution Place 1 drop into both eyes See admin instructions. 4 times a day for 2 days after eye injection (every 4 weeks)     TRULICITY 1.5 WY/6.3ZC SOPN ADMINISTER 1.5 MG UNDER THE SKIN 1 TIME A WEEK (Patient taking differently: Inject 1.5 mg into the skin every Friday.) 2 mL 5   No current facility-administered medications for this visit.     Review of Systems  Please see the history of present illness.     All other systems reviewed and are otherwise negative except as noted above.  Physical Exam    Wt Readings from Last 3 Encounters:  04/21/22 184 lb 8 oz (83.7 kg)  04/21/22 183 lb (83 kg)  04/03/22 186 lb (84.4 kg)   HY:IFOYD were no vitals filed for this visit.,There is no height or weight on file to calculate BMI.  Constitutional:      Appearance: Healthy appearance. Not in distress.  Neck:     Vascular: JVD normal.   Pulmonary:     Effort: Pulmonary effort is normal.     Breath sounds: No wheezing. No rales. Diminished in the bases Cardiovascular:     Normal rate. Regular rhythm. Normal S1. Normal S2.      Murmurs: There is no murmur.  Edema:    Peripheral edema absent.  Abdominal:     Palpations: Abdomen is soft non tender. There is no hepatomegaly.  Skin:    General: Skin is warm and dry.  Neurological:     General: No focal deficit present.     Mental Status: Alert and oriented to person, place and time.     Cranial Nerves: Cranial nerves are intact.  EKG/LABS/Other Studies Reviewed    ECG personally reviewed by me today -none completed today  Lab Results  Component Value Date   WBC 4.5 04/21/2022   HGB 10.5 (L) 04/21/2022   HCT 31.1 (L) 04/21/2022   MCV 82.3 04/21/2022   PLT 266 04/21/2022   Lab Results  Component Value Date   CREATININE 1.37 (H) 04/21/2022   BUN 28 (H) 04/21/2022   NA 136 04/21/2022   K 4.3 04/21/2022   CL 108 04/21/2022   CO2 25 04/21/2022   Lab Results  Component Value Date   ALT 18 04/21/2022   AST 30 04/21/2022   ALKPHOS 97 04/21/2022   BILITOT 0.6 04/21/2022   Lab Results  Component Value Date   CHOL 125 08/13/2020   HDL 31 (L) 08/13/2020   LDLCALC 52 08/13/2020   TRIG 268 (H) 08/13/2020   CHOLHDL 4.0 08/13/2020    Lab Results  Component Value Date   HGBA1C 6.2 12/31/2020    Assessment & Plan    1.  Preoperative clearance: Preop clearance for inguinal lymph node dissection for Merkel Cell tumor  by Dr. Stark Klein [facility]. Dr. Marlou Porch said it's ok to hold Plavix prior to surgery. Patient with cardiomyopathy EF 15% 2017. METs <4. Patient euvolemic today but high risk for surgery.  -Patient underwent restratification with 2D echo and Lexiscan Myoview that revealed no ST elevation or ischemia. -Therefore, based on ACC/AHA guidelines, the patient would be at acceptable risk for the planned procedure without further cardiovascular testing.  The patient was advised that if  he develops new symptoms prior to surgery to contact our office to arrange for a follow-up visit, and he verbalized understanding.   -Patient was advised that he is cleared to hold Plavix for surgery if necessary   according to the Revised Cardiac Risk Index (RCRI), his Perioperative Risk of Major Cardiac Event is (%): 11   His Functional Capacity in METs is: 3.97 according to the [employer] Activity Status Index (DASI).  2.  Coronary artery disease: -Patient had previous PCI 2 to his LAD completed in 2011 with repeat cath for in-stent restenosis in 2015 -Today patient reports no chest discomfort or anginal equivalent -Continue GDMT with Lipitor 80 mg, Plavix 75 mg patient is now on Imdur 10 mg daily  3.  Hypertension: -Patient's blood pressure today was well controlled at 118/70 -Continue carvedilol 12.5 mg twice daily  4.  Ischemic cardiomyopathy: -2D echo was completed and revealed EF of 30-35% with left ventricle wall motion abnormalities LVH small pericardial effusion, and normal aortic root structure. -Patient is euvolemic on examination -Patient reports that Lasix was discontinued -Low sodium diet, fluid restriction <2L, and daily weights encouraged. Educated to contact our office for weight gain of 2 lbs overnight or 5 lbs in one week.   Disposition: Follow-up with Candee Furbish, MD or APP in 6 months    Medication Adjustments/Labs and Tests Ordered: Current medicines are reviewed at length with the patient today.  Concerns regarding medicines are outlined above.   Signed, Mable Fill, Marissa Nestle, NP 04/22/2022, 10:17 AM [facility]  Note:  This document was prepared using Dragon voice recognition software and may include unintentional dictation errors.

## 2022-04-23 ENCOUNTER — Encounter: Payer: Self-pay | Admitting: Nurse Practitioner

## 2022-04-23 ENCOUNTER — Ambulatory Visit: Payer: Medicare (Managed Care) | Attending: Physician Assistant | Admitting: Nurse Practitioner

## 2022-04-23 ENCOUNTER — Ambulatory Visit: Payer: Medicare (Managed Care)

## 2022-04-23 VITALS — BP 118/70 | HR 113 | Ht 72.0 in | Wt 182.0 lb

## 2022-04-23 DIAGNOSIS — I2510 Atherosclerotic heart disease of native coronary artery without angina pectoris: Secondary | ICD-10-CM | POA: Diagnosis not present

## 2022-04-23 DIAGNOSIS — I255 Ischemic cardiomyopathy: Secondary | ICD-10-CM | POA: Diagnosis not present

## 2022-04-23 DIAGNOSIS — I10 Essential (primary) hypertension: Secondary | ICD-10-CM | POA: Diagnosis not present

## 2022-04-23 DIAGNOSIS — Z01810 Encounter for preprocedural cardiovascular examination: Secondary | ICD-10-CM

## 2022-04-23 LAB — T4: T4, Total: 7.3 ug/dL (ref 4.5–12.0)

## 2022-04-23 NOTE — Patient Instructions (Signed)
Medication Instructions:  Your physician recommends that you continue on your current medications as directed. Please refer to the Current Medication list given to you today.  *If you need a refill on your cardiac medications before your next appointment, please call your pharmacy*   Lab Work: None ordered  If you have labs (blood work) drawn today and your tests are completely normal, you will receive your results only by: MyChart Message (if you have MyChart) OR A paper copy in the mail If you have any lab test that is abnormal or we need to change your treatment, we will call you to review the results.   Testing/Procedures: None ordered   Follow-Up: At  HeartCare, you and your health needs are our priority.  As part of our continuing mission to provide you with exceptional heart care, we have created designated Provider Care Teams.  These Care Teams include your primary Cardiologist (physician) and Advanced Practice Providers (APPs -  Physician Assistants and Nurse Practitioners) who all work together to provide you with the care you need, when you need it.  We recommend signing up for the patient portal called "MyChart".  Sign up information is provided on this After Visit Summary.  MyChart is used to connect with patients for Virtual Visits (Telemedicine).  Patients are able to view lab/test results, encounter notes, upcoming appointments, etc.  Non-urgent messages can be sent to your provider as well.   To learn more about what you can do with MyChart, go to [email or web address].    Your next appointment:   6 month(s)  The format for your next appointment:   In Person  Provider:   Mark Skains, MD     Other Instructions   Important Information About Sugar       

## 2022-04-24 ENCOUNTER — Ambulatory Visit: Payer: Medicare (Managed Care)

## 2022-04-26 ENCOUNTER — Emergency Department (HOSPITAL_COMMUNITY): Payer: Medicare (Managed Care)

## 2022-04-26 ENCOUNTER — Other Ambulatory Visit: Payer: Self-pay

## 2022-04-26 ENCOUNTER — Encounter (HOSPITAL_COMMUNITY): Payer: Self-pay

## 2022-04-26 ENCOUNTER — Emergency Department (HOSPITAL_COMMUNITY)
Admission: EM | Admit: 2022-04-26 | Discharge: 2022-04-26 | Disposition: A | Discharge status: Home / Self Care | Payer: Medicare (Managed Care) | Attending: Emergency Medicine | Admitting: Emergency Medicine

## 2022-04-26 DIAGNOSIS — Z7984 Long term (current) use of oral hypoglycemic drugs: Secondary | ICD-10-CM | POA: Diagnosis not present

## 2022-04-26 DIAGNOSIS — Z7901 Long term (current) use of anticoagulants: Secondary | ICD-10-CM | POA: Insufficient documentation

## 2022-04-26 DIAGNOSIS — I5022 Chronic systolic (congestive) heart failure: Secondary | ICD-10-CM | POA: Diagnosis not present

## 2022-04-26 DIAGNOSIS — I110 Hypertensive heart disease with heart failure: Secondary | ICD-10-CM | POA: Diagnosis not present

## 2022-04-26 DIAGNOSIS — Z794 Long term (current) use of insulin: Secondary | ICD-10-CM | POA: Insufficient documentation

## 2022-04-26 DIAGNOSIS — Z79899 Other long term (current) drug therapy: Secondary | ICD-10-CM | POA: Diagnosis not present

## 2022-04-26 DIAGNOSIS — I10 Essential (primary) hypertension: Secondary | ICD-10-CM | POA: Insufficient documentation

## 2022-04-26 DIAGNOSIS — I2510 Atherosclerotic heart disease of native coronary artery without angina pectoris: Secondary | ICD-10-CM | POA: Insufficient documentation

## 2022-04-26 DIAGNOSIS — Z20822 Contact with and (suspected) exposure to covid-19: Secondary | ICD-10-CM | POA: Diagnosis not present

## 2022-04-26 DIAGNOSIS — R1013 Epigastric pain: Secondary | ICD-10-CM | POA: Diagnosis not present

## 2022-04-26 DIAGNOSIS — R112 Nausea with vomiting, unspecified: Secondary | ICD-10-CM | POA: Diagnosis not present

## 2022-04-26 DIAGNOSIS — E119 Type 2 diabetes mellitus without complications: Secondary | ICD-10-CM | POA: Diagnosis not present

## 2022-04-26 DIAGNOSIS — N289 Disorder of kidney and ureter, unspecified: Secondary | ICD-10-CM

## 2022-04-26 LAB — COMPREHENSIVE METABOLIC PANEL
ALT: 20 U/L (ref 0–44)
AST: 33 U/L (ref 15–41)
Albumin: 4 g/dL (ref 3.5–5.0)
Alkaline Phosphatase: 104 U/L (ref 38–126)
Anion gap: 9 (ref 5–15)
BUN: 29 mg/dL — ABNORMAL HIGH (ref 8–23)
CO2: 24 mmol/L (ref 22–32)
Calcium: 9.7 mg/dL (ref 8.9–10.3)
Chloride: 106 mmol/L (ref 98–111)
Creatinine, Ser: 1.64 mg/dL — ABNORMAL HIGH (ref 0.61–1.24)
GFR, Estimated: 45 mL/min — ABNORMAL LOW (ref >60)
Glucose, Bld: 178 mg/dL — ABNORMAL HIGH (ref 70–99)
Potassium: 4.6 mmol/L (ref 3.5–5.1)
Sodium: 139 mmol/L (ref 135–145)
Total Bilirubin: 1.5 mg/dL — ABNORMAL HIGH (ref 0.3–1.2)
Total Protein: 8.6 g/dL — ABNORMAL HIGH (ref 6.5–8.1)

## 2022-04-26 LAB — CBC WITH DIFFERENTIAL/PLATELET
Abs Immature Granulocytes: 0.04 10*3/uL (ref 0.00–0.07)
Basophils Absolute: 0 10*3/uL (ref 0.0–0.1)
Basophils Relative: 0 %
Eosinophils Absolute: 0 10*3/uL (ref 0.0–0.5)
Eosinophils Relative: 0 %
HCT: 37 % — ABNORMAL LOW (ref 39.0–52.0)
Hemoglobin: 12.1 g/dL — ABNORMAL LOW (ref 13.0–17.0)
Immature Granulocytes: 0 %
Lymphocytes Relative: 8 %
Lymphs Abs: 0.8 10*3/uL (ref 0.7–4.0)
MCH: 27.5 pg (ref 26.0–34.0)
MCHC: 32.7 g/dL (ref 30.0–36.0)
MCV: 84.1 fL (ref 80.0–100.0)
Monocytes Absolute: 0.8 10*3/uL (ref 0.1–1.0)
Monocytes Relative: 8 %
Neutro Abs: 8.2 10*3/uL — ABNORMAL HIGH (ref 1.7–7.7)
Neutrophils Relative %: 84 %
Platelets: 301 10*3/uL (ref 150–400)
RBC: 4.4 MIL/uL (ref 4.22–5.81)
RDW: 15.4 % (ref 11.5–15.5)
WBC: 9.9 10*3/uL (ref 4.0–10.5)
nRBC: 0 % (ref 0.0–0.2)

## 2022-04-26 LAB — URINALYSIS, ROUTINE W REFLEX MICROSCOPIC
Bilirubin Urine: NEGATIVE
Glucose, UA: NEGATIVE mg/dL
Hgb urine dipstick: NEGATIVE
Ketones, ur: 80 mg/dL — AB
Leukocytes,Ua: NEGATIVE
Nitrite: NEGATIVE
Protein, ur: >=300 mg/dL — AB
Specific Gravity, Urine: 1.032 — ABNORMAL HIGH (ref 1.005–1.030)
pH: 6 (ref 5.0–8.0)

## 2022-04-26 LAB — RESP PANEL BY RT-PCR (FLU A&B, COVID) ARPGX2
Influenza A by PCR: NEGATIVE
Influenza B by PCR: NEGATIVE
SARS Coronavirus 2 by RT PCR: NEGATIVE

## 2022-04-26 LAB — MAGNESIUM: Magnesium: 2.1 mg/dL (ref 1.7–2.4)

## 2022-04-26 LAB — LACTIC ACID, PLASMA
Lactic Acid, Venous: 2.1 mmol/L (ref 0.5–1.9)
Lactic Acid, Venous: 1.2 mmol/L (ref 0.5–1.9)

## 2022-04-26 LAB — BRAIN NATRIURETIC PEPTIDE: B Natriuretic Peptide: 42.9 pg/mL (ref 0.0–100.0)

## 2022-04-26 LAB — LIPASE, BLOOD: Lipase: 44 U/L (ref 11–51)

## 2022-04-26 MED ORDER — FAMOTIDINE IN NACL 20-0.9 MG/50ML-% IV SOLN
20.0000 mg | Freq: Once | INTRAVENOUS | Status: AC
  Administered 2022-04-26: 20 mg via INTRAVENOUS
  Filled 2022-04-26: qty 50

## 2022-04-26 MED ORDER — PANTOPRAZOLE SODIUM 20 MG PO TBEC: 20.0000 mg | DELAYED_RELEASE_TABLET | Freq: Every day | ORAL | 0 refills | Status: DC

## 2022-04-26 MED ORDER — CARVEDILOL 12.5 MG PO TABS
12.5000 mg | ORAL_TABLET | Freq: Once | ORAL | Status: AC
  Administered 2022-04-26: 12.5 mg via ORAL
  Filled 2022-04-26: qty 1

## 2022-04-26 MED ORDER — IOHEXOL 300 MG/ML  SOLN
80.0000 mL | Freq: Once | INTRAMUSCULAR | Status: AC | PRN
  Administered 2022-04-26: 80 mL via INTRAVENOUS

## 2022-04-26 MED ORDER — SUCRALFATE 1 G PO TABS: 1.0000 g | ORAL_TABLET | Freq: Three times a day (TID) | ORAL | 0 refills | Status: DC

## 2022-04-26 MED ORDER — ONDANSETRON HCL 4 MG/2ML IJ SOLN
4.0000 mg | Freq: Once | INTRAMUSCULAR | Status: AC
  Administered 2022-04-26: 4 mg via INTRAVENOUS
  Filled 2022-04-26: qty 2

## 2022-04-26 MED ORDER — SODIUM CHLORIDE 0.9 % IV BOLUS
500.0000 mL | Freq: Once | INTRAVENOUS | Status: AC
  Administered 2022-04-26: 500 mL via INTRAVENOUS

## 2022-04-26 MED ORDER — METOPROLOL TARTRATE 5 MG/5ML IV SOLN
5.0000 mg | Freq: Once | INTRAVENOUS | Status: AC
  Administered 2022-04-26: 5 mg via INTRAVENOUS
  Filled 2022-04-26: qty 5

## 2022-04-26 MED ORDER — IOHEXOL 300 MG/ML  SOLN: 100.0000 mL | Freq: Once | INTRAMUSCULAR | Status: DC | PRN

## 2022-04-26 MED ORDER — ONDANSETRON HCL 4 MG PO TABS: 4.0000 mg | ORAL_TABLET | ORAL | 0 refills | Status: DC | PRN

## 2022-04-26 MED ORDER — SUCRALFATE 1 GM/10ML PO SUSP
1.0000 g | Freq: Once | ORAL | Status: AC
  Administered 2022-04-26: 1 g via ORAL
  Filled 2022-04-26: qty 10

## 2022-04-26 NOTE — ED Provider Notes (Signed)
[facility] DEPT Provider Note   CSN: [id number] Arrival date & time: 04/26/22  1818     History  Chief Complaint  Patient presents with   Emesis    Antonio Guerra is a 71 y.o. male.  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  Symptom onset approximately 3 days ago.  Symptoms exacerbated by p.o. intake.  Pain primarily in the epigastrium, described as a burning, sharp, intermittently stabbing sensation.  Not take any of his medications today secondary to the discomfort, nausea.  No change in bowel or bladder function.  No fevers or chills.  No significant chest pain or dyspnea.  No rashes.     Past Medical History:  Diagnosis Date   Altered mental status    Arthritis    "all over" (12/20/2015)   Chronic lower back pain    Chronic systolic congestive heart failure ([place])    a. 04/2014 Echo: EF 20-25%.   Coronary artery disease    a. 2011 MI x 2 with PCI: stent x 2 (LAD and RI) @ [facility] in [place], [place];  b. 04/2014 Cath/PCI: LM 10-20, LAD 40p, 78m, 8m ISR(3.5x38 Xience DES), 50apical, LCX 20 diffuse, RI 50p, 7m ISR, RCA 40-42m, 30d.   Fibromyalgia    Hyperlipidemia    Hypertension    Hypertensive urgency 09/27/2015   Myocardial infarction [facility]) 2015   Stroke [facility]) 2012   "right hand weaker since" (12/20/2015)   Type II diabetes mellitus ([place])     Past Surgical History:  Procedure Laterality Date   ANKLE FRACTURE SURGERY Right    CARDIAC CATHETERIZATION N/A 09/30/2015   Procedure: Left Heart Cath and Coronary Angiography;  Surgeon: Burnell Blanks, MD;  Location: [facility] CV LAB;  Service: Cardiovascular;  Laterality: N/A;   CORONARY ANGIOPLASTY WITH STENT PLACEMENT     "total of 3 stents" (12/20/2015)   FRACTIONAL FLOW RESERVE WIRE  05/11/2014    Procedure: FRACTIONAL FLOW RESERVE WIRE;  Surgeon: Burnell Blanks, MD;  Location: [facility] CATH LAB;  Service: Cardiovascular;;   FRACTURE SURGERY     LEFT HEART CATHETERIZATION WITH CORONARY/GRAFT ANGIOGRAM N/A 05/11/2014   Procedure: LEFT HEART CATHETERIZATION WITH Beatrix Fetters;  Surgeon: Burnell Blanks, MD;  Location: [facility] CATH LAB;  Service: Cardiovascular;  Laterality: N/A;   PERCUTANEOUS CORONARY STENT INTERVENTION (PCI-S)  05/11/2014   Procedure: PERCUTANEOUS CORONARY STENT INTERVENTION (PCI-S);  Surgeon: Burnell Blanks, MD;  Location: [facility] CATH LAB;  Service: Cardiovascular;;     The history is provided by the patient and a relative. No language interpreter was used.  Emesis Associated symptoms: abdominal pain   Associated symptoms: no chills, no cough, no fever and no headaches        Home Medications Prior to Admission medications   Medication Sig Start Date End Date Taking? Authorizing Provider  ondansetron (ZOFRAN) 4 MG tablet Take 1 tablet (4 mg total) by mouth every 4 (four) hours as needed for nausea or vomiting. 04/26/22  Yes Wynona Dove A, DO  pantoprazole (PROTONIX) 20 MG tablet Take 1 tablet (20 mg total) by mouth daily for 14 days. 04/26/22 05/10/22 Yes Wynona Dove A, DO  sucralfate (CARAFATE) 1 g tablet Take 1 tablet (1 g total) by mouth 4 (four) times daily -  with meals and at bedtime for  7 days. 04/26/22 05/03/22 Yes Jeanell Sparrow, DO  acetaminophen (TYLENOL) 650 MG CR tablet Take 650 mg by mouth every 8 (eight) hours as needed for pain.    [provider]  atorvastatin (LIPITOR) 80 MG tablet TAKE 1 TABLET(80 MG) BY MOUTH AT BEDTIME 05/19/21   [name], MD  Blood Glucose Monitoring Suppl (ONETOUCH VERIO) w/Device KIT 30 Units by Does not apply route daily. 11/02/17   Diallo, Earna Coder, MD  carvedilol (COREG) 12.5 MG tablet Take 12.5 mg by mouth 2 (two) times daily with a meal.    [provider]  clopidogrel (PLAVIX) 75  MG tablet TAKE 1 TABLET(75 MG) BY MOUTH DAILY 01/29/21   [name], MD  Continuous Blood Gluc Receiver (FREESTYLE LIBRE 14 DAY READER) DEVI 1 Device by Does not apply route every morning. 11/08/19   Bonnita Hollow, MD  cycloSPORINE (RESTASIS) 0.05 % ophthalmic emulsion Place 1 drop into both eyes 2 (two) times daily.    [provider]  Dulaglutide (TRULICITY) 7.61 YW/7.3XT SOPN Inject into the skin.    [provider]  gabapentin (NEURONTIN) 400 MG capsule TAKE 1 CAPSULE BY MOUTH TWICE DAILY 02/20/21   [name], MD  gabapentin (NEURONTIN) 400 MG capsule Take 400 mg by mouth at bedtime as needed (uncontrolled neuropathy).    [provider]  Incontinence Supply Disposable (DEPEND ADJUSTABLE UNDERWEAR LG) MISC 1 application by Does not apply route as needed. 08/09/20   [name], MD  insulin glargine (LANTUS SOLOSTAR) 100 UNIT/ML Solostar Pen Inject 40 Units into the skin daily. Patient taking differently: Inject 20 Units into the skin daily. 12/19/20   [name], MD  Insulin Pen Needle (BD PEN NEEDLE NANO U/F) 32G X 4 MM MISC AS DIRECTED TWICE DAILY 02/24/21   [name], MD  isosorbide mononitrate (ISMO) 10 MG tablet Take 10 mg by mouth 2 (two) times daily.    [provider]  Lancets [facility] ULTRASOFT) lancets USE AS DIRECTED 08/09/20   [name], MD  lidocaine (SALONPAS PAIN RELIEVING) 4 % Place 1 patch onto the skin daily as needed (pain).    [provider]  nitroGLYCERIN (NITROSTAT) 0.4 MG SL tablet PLACE 1 TABLET UNDER THE TONGUE EVERY 5 MINS AS NEEDED FOR CHEST PAIN 04/08/21   [name], MD  [facility] VERIO test strip USE TO TEST BLOOD SUGAR 2 TO 3 TIMES A DAY 04/09/21   [name], MD  traMADol (ULTRAM) 50 MG tablet Take 1 tablet (50 mg total) by mouth every 8 (eight) hours as needed. 02/16/22   Benay Pike, MD      Allergies    Lisinopril and Tape    Review of Systems   Review of Systems  Constitutional:  Positive for  fatigue. Negative for chills and fever.  HENT:  Negative for facial swelling and trouble swallowing.   Eyes:  Negative for photophobia and visual disturbance.  Respiratory:  Negative for cough and shortness of breath.   Cardiovascular:  Negative for chest pain and palpitations.  Gastrointestinal:  Positive for abdominal pain, nausea and vomiting.  Endocrine: Negative for polydipsia and polyuria.  Genitourinary:  Negative for difficulty urinating and hematuria.  Musculoskeletal:  Negative for gait problem and joint swelling.  Skin:  Negative for pallor and rash.  Neurological:  Negative for syncope and headaches.  Psychiatric/Behavioral:  Negative for agitation and confusion.     Physical Exam Updated Vital Signs BP (!) 152/81   Pulse (!) 114   Temp 99.4  F (37.4 C) (Oral)   Resp (!) 21   Ht $R'6\' 1"'Vf$  (1.854 m)   Wt 84.4 kg   SpO2 99%   BMI 24.54 kg/m  Physical Exam Vitals and nursing note reviewed.  Constitutional:      General: He is not in acute distress.    Appearance: He is well-developed.  HENT:     Head: Normocephalic and atraumatic.     Right Ear: External ear normal.     Left Ear: External ear normal.     Mouth/Throat:     Mouth: Mucous membranes are moist.  Eyes:     General: No scleral icterus. Cardiovascular:     Rate and Rhythm: Normal rate and regular rhythm.     Pulses: Normal pulses.     Heart sounds: Normal heart sounds.  Pulmonary:     Effort: Pulmonary effort is normal. No respiratory distress.     Breath sounds: Normal breath sounds.  Abdominal:     General: Abdomen is flat.     Palpations: Abdomen is soft.     Tenderness: There is abdominal tenderness in the epigastric area. There is no guarding or rebound.    Musculoskeletal:        General: Normal range of motion.     Cervical back: Normal range of motion.     Right lower leg: No edema.     Left lower leg: No edema.  Skin:    General: Skin is warm and dry.     Capillary Refill: Capillary  refill takes less than 2 seconds.  Neurological:     Mental Status: He is alert and oriented to person, place, and time.     GCS: GCS eye subscore is 4. GCS verbal subscore is 5. GCS motor subscore is 6.  Psychiatric:        Mood and Affect: Mood normal.        Behavior: Behavior normal.     ED Results / Procedures / Treatments   Labs (all labs ordered are listed, but only abnormal results are displayed) Labs Reviewed  LACTIC ACID, PLASMA - Abnormal; Notable for the following components:      Result Value   Lactic Acid, Venous 2.1 (*)    All other components within normal limits  COMPREHENSIVE METABOLIC PANEL - Abnormal; Notable for the following components:   Glucose, Bld 178 (*)    BUN 29 (*)    Creatinine, Ser 1.64 (*)    Total Protein 8.6 (*)    Total Bilirubin 1.5 (*)    GFR, Estimated 45 (*)    All other components within normal limits  CBC WITH DIFFERENTIAL/PLATELET - Abnormal; Notable for the following components:   Hemoglobin 12.1 (*)    HCT 37.0 (*)    Neutro Abs 8.2 (*)    All other components within normal limits  URINALYSIS, ROUTINE W REFLEX MICROSCOPIC - Abnormal; Notable for the following components:   APPearance HAZY (*)    Specific Gravity, Urine 1.032 (*)    Ketones, ur 80 (*)    Protein, ur >=300 (*)    Bacteria, UA RARE (*)    All other components within normal limits  RESP PANEL BY RT-PCR (FLU A&B, COVID) ARPGX2  LACTIC ACID, PLASMA  LIPASE, BLOOD  MAGNESIUM  BRAIN NATRIURETIC PEPTIDE    EKG EKG Interpretation  Date/Time:  Sunday April 26 2022 21:46:53 EDT Ventricular Rate:  119 PR Interval:  161 QRS Duration: 102 QT Interval:  310 QTC Calculation: 437  R Axis:   -69 Text Interpretation: Sinus tachycardia Left anterior fascicular block Anterior infarct, old Nonspecific T abnormalities, lateral leads Confirmed by Tanda Rockers ([id number]) on 04/26/2022 10:25:27 PM  Radiology CT ABDOMEN PELVIS W CONTRAST  Result Date: 04/26/2022 CLINICAL DATA:   Epigastric pain. Pt is being treated for cancer, abd pain .vomiting 3 days. Merkel cell carcinoma. EXAM: CT ABDOMEN AND PELVIS WITH CONTRAST TECHNIQUE: Multidetector CT imaging of the abdomen and pelvis was performed using the standard protocol following bolus administration of intravenous contrast. RADIATION DOSE REDUCTION: This exam was performed according to the departmental dose-optimization program which includes automated exposure control, adjustment of the mA and/or kV according to patient size and/or use of iterative reconstruction technique. CONTRAST:  <See Chart> OMNIPAQUE IOHEXOL 300 MG/ML SOLN, 63mL OMNIPAQUE IOHEXOL 300 MG/ML SOLN COMPARISON:  Head CT 02/20/2022 FINDINGS: Lower chest: Limited evaluation due to respiratory motion artifact. Question peribronchovascular ground-glass airspace opacities that are poorly visualized. Patulous distal esophagus with distal circumferential esophageal wall thickening. Hepatobiliary: No focal liver abnormality. No gallstones, gallbladder wall thickening, or pericholecystic fluid. No biliary dilatation. Pancreas: No focal lesion. Normal pancreatic contour. No surrounding inflammatory changes. No main pancreatic ductal dilatation. Spleen: Normal in size without focal abnormality. Adrenals/Urinary Tract: No adrenal nodule bilaterally. Bilateral kidneys enhance symmetrically. There is a 1.2 cm left inferior renal pole complex cystic lesion. No hydronephrosis. No hydroureter. The urinary bladder is unremarkable. On delayed imaging, there is no urothelial wall thickening and there are no filling defects in the opacified portions of the bilateral collecting systems or ureters. Stomach/Bowel: There is gastric wall thickening along the gastroesophageal junction. Several loops of small bowel are distended with fluid. No pneumatosis. No evidence of bowel wall thickening or dilatation. Appendix appears normal. Vascular/Lymphatic: No abdominal aorta or iliac aneurysm. Severe  atherosclerotic plaque of the aorta and its branches. Interval increase in size of a 7.6 x 7.7 cm centrally necrotic right inguinal mass (2:93). Interval increase in size of a right [place] lymph node measuring 3.7 x 3.1 cm (from 2.2 x 3.1 cm). Interval increase in size of right iliac lymph nodes with as an example a 1.8 cm (from 0.7 cm) lymph node. Reproductive: The prostate is enlarged measuring up to 5 cm. Other: No intraperitoneal free fluid. No intraperitoneal free gas. No organized fluid collection. Musculoskeletal: No abdominal wall hernia or abnormality. No suspicious lytic or blastic osseous lesions. No acute displaced fracture. Multilevel degenerative changes of the spine. IMPRESSION: 1. Question peribronchovascular ground-glass airspace opacities that are poorly visualized due to respiratory motion artifact. 2. Distal circumferential esophageal wall thickening as well as gastroesophageal junction wall thickening. Recommend direct visualization/biopsy for further evaluation. 3. Interval increase in size of right pelvic and inguinal lymphadenopathy in a patient with known malignancy. 4. A complex 1.2 cm left inferior renal pole lesion is incompletely evaluated on this study. When the patient is clinically stable and able to follow directions and hold their breath (preferably as an outpatient) further evaluation with dedicated nonemergent abdominal MRI renal protocol should be considered. 5.  Aortic Atherosclerosis (ICD10-I70.0). Electronically Signed   By: Tish Frederickson M.D.   On: 04/26/2022 21:17   DG Chest 2 View  Result Date: 04/26/2022 CLINICAL DATA:  Vomiting. Chemotherapy reaction with vomiting for 3 days. History of skin cancer. EXAM: CHEST - 2 VIEW COMPARISON:  03/05/2020 FINDINGS: The heart size and mediastinal contours are within normal limits. Both lungs are clear. The visualized skeletal structures are unremarkable. IMPRESSION: No active cardiopulmonary disease. Electronically Signed  By:  Lucienne Capers M.D.   On: 04/26/2022 19:09    Procedures Procedures    Medications Ordered in ED Medications  famotidine (PEPCID) IVPB 20 mg premix (0 mg Intravenous Stopped 04/26/22 2016)  ondansetron (ZOFRAN) injection 4 mg (4 mg Intravenous Given 04/26/22 1938)  sodium chloride 0.9 % bolus 500 mL (0 mLs Intravenous Stopped 04/26/22 2217)  metoprolol tartrate (LOPRESSOR) injection 5 mg (5 mg Intravenous Given 04/26/22 2115)  iohexol (OMNIPAQUE) 300 MG/ML solution 80 mL (80 mLs Intravenous Contrast Given 04/26/22 2104)  carvedilol (COREG) tablet 12.5 mg (12.5 mg Oral Given 04/26/22 2147)  sucralfate (CARAFATE) 1 GM/10ML suspension 1 g (1 g Oral Given 04/26/22 2147)  sodium chloride 0.9 % bolus 500 mL (0 mLs Intravenous Stopped 04/26/22 2309)  metoprolol tartrate (LOPRESSOR) injection 5 mg (5 mg Intravenous Given 04/26/22 2255)    ED Course/ Medical Decision Making/ A&P Clinical Course as of 04/26/22 2343  Sun Apr 26, 2022  2227 HR persistently elevated despite IVF and home carvedilol, was also given IV lopressor. Chart review shows multiple primary care visits with HR 110 range. He has no palpitations  or chest pain.  [SG]    Clinical Course User Index [SG] Jeanell Sparrow, DO                           Medical Decision Making Amount and/or Complexity of Data Reviewed Labs: ordered. Radiology: ordered. ECG/medicine tests: ordered.  Risk Prescription drug management.   This patient presents to the ED with chief complaint(s) of abd pain, n/v with pertinent past medical history of CAD, chemotherapy as above which further complicates the presenting complaint. The complaint involves an extensive differential diagnosis and also carries with it a high risk of complications and morbidity.    Differential diagnosis includes but is not exclusive to acute cholecystitis, intrathoracic causes for epigastric abdominal pain, gastritis, duodenitis, pancreatitis, small bowel or large bowel  obstruction, abdominal aortic aneurysm, hernia, gastritis, etc.  . Serious etiologies were considered.   The initial plan is to screening labs and imaging, IV fluids, antiemetics   Additional history obtained: Additional history obtained from family Records reviewed Primary Care Documents and prior labs and imaging, medications  Independent labs interpretation:  The following labs were independently interpreted:  CMP with creatinine similar to his baseline.   Independent visualization of imaging: - I independently visualized the following imaging with scope of interpretation limited to determining acute life threatening conditions related to emergency care: CT abdomen pelvis, chest x-ray, which revealed esophagitis, gastritis, renal lesion. CXR wnl  Cardiac monitoring was reviewed and interpreted by myself which shows sinus tachy >> per chart review pt typically has elevated HR, baseline seems to be around 110, did not take meds today  Treatment and Reassessment: Given home blood pressure medication, GI cocktail, IV Lopressor >> Symptoms have resolved.  Heart rate improved.  He is able to tolerate p.o. intake without difficulty.  rpt abdominal exam is soft, nonperitoneal.  Consultation: - Consulted or discussed management/test interpretation w/ external professional: na  Consideration for admission or further workup: Admission was considered    71 year old male with history above to the ED with nausea, vomiting abdominal pain.  Labs reviewed and are stable.  Imaging concern for possible esophagitis, gastritis.  No melena or BRBPR.  Nausea and vomiting has resolved.  abdominal pain is resolved.  Heart rate has improved after fluids and home medication, IV medication.  Encouraged bland diet with  the patient, give PPI, Carafate for home.  Follow-up with GI.  No chest pain, no palpitations, no dyspnea.  On reassessment he is feeling back to his baseline.  Does have history of  chronic alcohol abuse, no longer drinking alcohol.  Possibly etiology of his esophagitis, gastritis.  Will defer to GI, would likely benefit from endoscopy   The patient improved significantly and was discharged in stable condition. Detailed discussions were had with the patient regarding current findings, and need for close f/u with PCP or on call doctor. The patient has been instructed to return immediately if the symptoms worsen in any way for re-evaluation. Patient verbalized understanding and is in agreement with current care plan. All questions answered prior to discharge.    Social Determinants of health: Social History   Tobacco Use   Smoking status: Former   Smokeless tobacco: Never   Tobacco comments:    12/20/2015 "smoked cigarettes 30-3yr ago"  Vaping Use   Vaping Use: Never used  Substance Use Topics   Alcohol use: Not Currently    Alcohol/week: 5.0 standard drinks of alcohol    Types: 5 Cans of beer per week    Comment: stopped 4 yrs ago    Drug use: No            Final Clinical Impression(s) / ED Diagnoses Final diagnoses:  Epigastric pain  Nausea and vomiting, unspecified vomiting type  Renal lesion    Rx / DC Orders ED Discharge Orders          Ordered    sucralfate (CARAFATE) 1 g tablet  3 times daily with meals & bedtime        04/26/22 2304    pantoprazole (PROTONIX) 20 MG tablet  Daily        04/26/22 2304    ondansetron (ZOFRAN) 4 MG tablet  Every 4 hours PRN        04/26/22 2306              Jeanell Sparrow, DO 04/26/22 2343

## 2022-04-26 NOTE — Discharge Instructions (Addendum)
It was a pleasure caring for you today in the emergency department.  Please return to the emergency department for any worsening or worrisome symptoms.  Please follow-up with gastroenterology in 2 weeks for evaluation completion of medications prescribed today  Please see your pcp in regards to renal cyst

## 2022-04-26 NOTE — ED Triage Notes (Signed)
Pt BIB [facility] from home C/O vomiting X 3 days. Hx skin cancer, active chemo, last tx Monday.

## 2022-04-27 ENCOUNTER — Telehealth: Payer: Self-pay | Admitting: Radiation Oncology

## 2022-04-27 ENCOUNTER — Ambulatory Visit: Payer: Medicare (Managed Care)

## 2022-04-27 NOTE — Telephone Encounter (Signed)
10/9 @ 9:35 am Spoke to a patient's daughter [name].  She stated the patient and her are agreeable that he did not want radiation at this time and would like to focus on having chemo with Dr. Chryl Heck.

## 2022-04-28 ENCOUNTER — Ambulatory Visit: Payer: Medicare (Managed Care)

## 2022-04-29 ENCOUNTER — Ambulatory Visit: Payer: Medicare (Managed Care)

## 2022-04-30 ENCOUNTER — Ambulatory Visit: Payer: Medicare (Managed Care)

## 2022-05-01 ENCOUNTER — Ambulatory Visit: Payer: Medicare (Managed Care)

## 2022-05-04 ENCOUNTER — Emergency Department (HOSPITAL_COMMUNITY)
Admission: EM | Admit: 2022-05-04 | Discharge: 2022-05-04 | Disposition: A | Discharge status: Home / Self Care | Payer: Medicare (Managed Care) | Attending: Emergency Medicine | Admitting: Emergency Medicine

## 2022-05-04 ENCOUNTER — Other Ambulatory Visit: Payer: Self-pay

## 2022-05-04 ENCOUNTER — Encounter (HOSPITAL_COMMUNITY): Payer: Self-pay | Admitting: Emergency Medicine

## 2022-05-04 ENCOUNTER — Emergency Department (HOSPITAL_COMMUNITY): Payer: Medicare (Managed Care)

## 2022-05-04 ENCOUNTER — Ambulatory Visit: Payer: Medicare (Managed Care)

## 2022-05-04 DIAGNOSIS — R000 Tachycardia, unspecified: Secondary | ICD-10-CM | POA: Diagnosis not present

## 2022-05-04 DIAGNOSIS — K567 Ileus, unspecified: Secondary | ICD-10-CM | POA: Diagnosis not present

## 2022-05-04 DIAGNOSIS — R1033 Periumbilical pain: Secondary | ICD-10-CM | POA: Diagnosis present

## 2022-05-04 DIAGNOSIS — N189 Chronic kidney disease, unspecified: Secondary | ICD-10-CM | POA: Diagnosis not present

## 2022-05-04 DIAGNOSIS — Z794 Long term (current) use of insulin: Secondary | ICD-10-CM | POA: Insufficient documentation

## 2022-05-04 DIAGNOSIS — Z7984 Long term (current) use of oral hypoglycemic drugs: Secondary | ICD-10-CM | POA: Diagnosis not present

## 2022-05-04 DIAGNOSIS — E1122 Type 2 diabetes mellitus with diabetic chronic kidney disease: Secondary | ICD-10-CM | POA: Insufficient documentation

## 2022-05-04 DIAGNOSIS — R109 Unspecified abdominal pain: Secondary | ICD-10-CM

## 2022-05-04 DIAGNOSIS — R112 Nausea with vomiting, unspecified: Secondary | ICD-10-CM

## 2022-05-04 LAB — COMPREHENSIVE METABOLIC PANEL
ALT: 21 U/L (ref 0–44)
AST: 27 U/L (ref 15–41)
Albumin: 3.6 g/dL (ref 3.5–5.0)
Alkaline Phosphatase: 81 U/L (ref 38–126)
Anion gap: 8 (ref 5–15)
BUN: 30 mg/dL — ABNORMAL HIGH (ref 8–23)
CO2: 27 mmol/L (ref 22–32)
Calcium: 9.1 mg/dL (ref 8.9–10.3)
Chloride: 103 mmol/L (ref 98–111)
Creatinine, Ser: 1.62 mg/dL — ABNORMAL HIGH (ref 0.61–1.24)
GFR, Estimated: 46 mL/min — ABNORMAL LOW (ref >60)
Glucose, Bld: 154 mg/dL — ABNORMAL HIGH (ref 70–99)
Potassium: 4.3 mmol/L (ref 3.5–5.1)
Sodium: 138 mmol/L (ref 135–145)
Total Bilirubin: 1.2 mg/dL (ref 0.3–1.2)
Total Protein: 8 g/dL (ref 6.5–8.1)

## 2022-05-04 LAB — CBC WITH DIFFERENTIAL/PLATELET
Abs Immature Granulocytes: 0.02 10*3/uL (ref 0.00–0.07)
Basophils Absolute: 0 10*3/uL (ref 0.0–0.1)
Basophils Relative: 0 %
Eosinophils Absolute: 0 10*3/uL (ref 0.0–0.5)
Eosinophils Relative: 1 %
HCT: 37.8 % — ABNORMAL LOW (ref 39.0–52.0)
Hemoglobin: 12.4 g/dL — ABNORMAL LOW (ref 13.0–17.0)
Immature Granulocytes: 0 %
Lymphocytes Relative: 20 %
Lymphs Abs: 1 10*3/uL (ref 0.7–4.0)
MCH: 27.9 pg (ref 26.0–34.0)
MCHC: 32.8 g/dL (ref 30.0–36.0)
MCV: 84.9 fL (ref 80.0–100.0)
Monocytes Absolute: 0.5 10*3/uL (ref 0.1–1.0)
Monocytes Relative: 11 %
Neutro Abs: 3.5 10*3/uL (ref 1.7–7.7)
Neutrophils Relative %: 68 %
Platelets: 285 10*3/uL (ref 150–400)
RBC: 4.45 MIL/uL (ref 4.22–5.81)
RDW: 15.5 % (ref 11.5–15.5)
WBC: 5.1 10*3/uL (ref 4.0–10.5)
nRBC: 0 % (ref 0.0–0.2)

## 2022-05-04 LAB — MAGNESIUM: Magnesium: 2.4 mg/dL (ref 1.7–2.4)

## 2022-05-04 LAB — LIPASE, BLOOD: Lipase: 53 U/L — ABNORMAL HIGH (ref 11–51)

## 2022-05-04 MED ORDER — METOCLOPRAMIDE HCL 10 MG PO TABS: 10.0000 mg | ORAL_TABLET | Freq: Four times a day (QID) | ORAL | 0 refills | Status: DC | PRN

## 2022-05-04 MED ORDER — ONDANSETRON HCL 4 MG/2ML IJ SOLN
4.0000 mg | Freq: Once | INTRAMUSCULAR | Status: AC
  Administered 2022-05-04: 4 mg via INTRAVENOUS
  Filled 2022-05-04: qty 2

## 2022-05-04 MED ORDER — HYDROMORPHONE HCL 1 MG/ML IJ SOLN
0.5000 mg | Freq: Once | INTRAMUSCULAR | Status: AC
  Administered 2022-05-04: 0.5 mg via INTRAVENOUS
  Filled 2022-05-04: qty 1

## 2022-05-04 MED ORDER — IOHEXOL 300 MG/ML  SOLN
80.0000 mL | Freq: Once | INTRAMUSCULAR | Status: AC | PRN
  Administered 2022-05-04: 80 mL via INTRAVENOUS

## 2022-05-04 MED ORDER — OXYCODONE HCL 5 MG PO TABS
5.0000 mg | ORAL_TABLET | ORAL | Status: AC
  Administered 2022-05-04: 5 mg via ORAL
  Filled 2022-05-04: qty 1

## 2022-05-04 MED ORDER — SODIUM CHLORIDE (PF) 0.9 % IJ SOLN
INTRAMUSCULAR | Status: AC
  Filled 2022-05-04: qty 50

## 2022-05-04 NOTE — ED Provider Notes (Signed)
[facility] DEPT Provider Note   CSN: [id number] Arrival date & time: 05/04/22  1655     History  Chief Complaint  Patient presents with   Abdominal Pain    Kortney Schoenfelder is a 71 y.o. male.  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  Patient states that he has had nausea vomiting and abdominal pain since his most recent chemotherapy on 10/2.  Came to the emergency department on 10/8 with epigastric abdominal pain and had a CT scan that showed esophageal thickening without additional abnormality.  Patient states that since going home he had persistent abdominal pain and nausea and vomiting.  Has had 3-4 episodes of nonbloody nonbilious emesis per day that he reports happens every time he tries to eat.  Also says that he has been having approximately 3 episodes of nonbloody nonmelanotic diarrhea per day as well.  Says that his abdominal pain is sharp and stabbing in his periumbilical and denies any exacerbating or alleviating factors and states that it is constant throughout the day.  Denies any fevers or chills.  No urinary frequency or urgency.       Home Medications Prior to Admission medications   Medication Sig Start Date End Date Taking? Authorizing Provider  metoCLOPramide (REGLAN) 10 MG tablet Take 1 tablet (10 mg total) by mouth every 6 (six) hours as needed for up to 20 days for nausea. 05/04/22 05/24/22 Yes Fransico Meadow, MD  acetaminophen (TYLENOL) 650 MG CR tablet Take 650 mg by mouth every 8 (eight) hours as needed for pain.    [provider]  atorvastatin (LIPITOR) 80 MG tablet TAKE 1 TABLET(80 MG) BY MOUTH AT BEDTIME 05/19/21   [name], MD  Blood Glucose Monitoring Suppl (ONETOUCH VERIO) w/Device KIT 30 Units by Does  not apply route daily. 11/02/17   Diallo, Earna Coder, MD  carvedilol (COREG) 12.5 MG tablet Take 12.5 mg by mouth 2 (two) times daily with a meal.    [provider]  clopidogrel (PLAVIX) 75 MG tablet TAKE 1 TABLET(75 MG) BY MOUTH DAILY 01/29/21   [name], MD  Continuous Blood Gluc Receiver (FREESTYLE LIBRE 14 DAY READER) DEVI 1 Device by Does not apply route every morning. 11/08/19   Bonnita Hollow, MD  cycloSPORINE (RESTASIS) 0.05 % ophthalmic emulsion Place 1 drop into both eyes 2 (two) times daily.    [provider]  Dulaglutide (TRULICITY) 3.74 MO/7.0BE SOPN Inject into the skin.    [provider]  gabapentin (NEURONTIN) 400 MG capsule TAKE 1 CAPSULE BY MOUTH TWICE DAILY 02/20/21   [name], MD  gabapentin (NEURONTIN) 400 MG capsule Take 400 mg by mouth at bedtime as needed (uncontrolled neuropathy).    [provider]  Incontinence Supply Disposable (DEPEND ADJUSTABLE UNDERWEAR LG) MISC 1 application by Does not apply route as needed. 08/09/20   [name], MD  insulin glargine (LANTUS SOLOSTAR) 100 UNIT/ML Solostar Pen Inject 40 Units into the skin daily. Patient taking differently: Inject 20 Units into the skin daily. 12/19/20   [name], MD  Insulin Pen Needle (BD PEN NEEDLE NANO U/F) 32G X 4 MM MISC AS DIRECTED TWICE DAILY 02/24/21   [name], MD  isosorbide mononitrate (ISMO) 10 MG tablet Take 10 mg by mouth 2 (two) times daily.  [provider]  Lancets [facility] ULTRASOFT) lancets USE AS DIRECTED 08/09/20   [name], MD  lidocaine (SALONPAS PAIN RELIEVING) 4 % Place 1 patch onto the skin daily as needed (pain).    [provider]  nitroGLYCERIN (NITROSTAT) 0.4 MG SL tablet PLACE 1 TABLET UNDER THE TONGUE EVERY 5 MINS AS NEEDED FOR CHEST PAIN 04/08/21   [name], MD  [facility] VERIO test strip USE TO TEST BLOOD SUGAR 2 TO 3 TIMES A DAY 04/09/21   [name], MD  pantoprazole (PROTONIX) 20 MG tablet Take 1 tablet  (20 mg total) by mouth daily for 14 days. 04/26/22 05/10/22  Jeanell Sparrow, DO  sucralfate (CARAFATE) 1 g tablet Take 1 tablet (1 g total) by mouth 4 (four) times daily -  with meals and at bedtime for 7 days. 04/26/22 05/03/22  Jeanell Sparrow, DO  traMADol (ULTRAM) 50 MG tablet Take 1 tablet (50 mg total) by mouth every 8 (eight) hours as needed. 02/16/22   Benay Pike, MD      Allergies    Lisinopril and Tape    Review of Systems   Review of Systems  Physical Exam Updated Vital Signs BP (!) 99/58 (BP Location: Right Arm)   Pulse (!) 120   Temp 98.1 F (36.7 C) (Oral)   Resp 14   SpO2 97%  Physical Exam Vitals and nursing note reviewed.  Constitutional:      General: He is not in acute distress.    Appearance: He is well-developed.  HENT:     Head: Normocephalic and atraumatic.     Right Ear: External ear normal.     Left Ear: External ear normal.     Nose: Nose normal.  Eyes:     Extraocular Movements: Extraocular movements intact.     Conjunctiva/sclera: Conjunctivae normal.     Pupils: Pupils are equal, round, and reactive to light.  Cardiovascular:     Rate and Rhythm: Normal rate and regular rhythm.     Heart sounds: Normal heart sounds.  Pulmonary:     Effort: Pulmonary effort is normal. No respiratory distress.     Breath sounds: Normal breath sounds.  Abdominal:     General: There is no distension.     Palpations: Abdomen is soft. There is no mass.     Tenderness: There is abdominal tenderness (Diffuse). There is no guarding.  Musculoskeletal:        General: No swelling.     Cervical back: Normal range of motion and neck supple.     Right lower leg: No edema.     Left lower leg: No edema.  Skin:    General: Skin is warm and dry.     Capillary Refill: Capillary refill takes less than 2 seconds.  Neurological:     Mental Status: He is alert. Mental status is at baseline.  Psychiatric:        Mood and Affect: Mood normal.        Behavior: Behavior  normal.     ED Results / Procedures / Treatments   Labs (all labs ordered are listed, but only abnormal results are displayed) Labs Reviewed  COMPREHENSIVE METABOLIC PANEL - Abnormal; Notable for the following components:      Result Value   Glucose, Bld 154 (*)    BUN 30 (*)    Creatinine, Ser 1.62 (*)    GFR, Estimated 46 (*)    All other components within normal limits  LIPASE,  BLOOD - Abnormal; Notable for the following components:   Lipase 53 (*)    All other components within normal limits  CBC WITH DIFFERENTIAL/PLATELET - Abnormal; Notable for the following components:   Hemoglobin 12.4 (*)    HCT 37.8 (*)    All other components within normal limits  MAGNESIUM  URINALYSIS, ROUTINE W REFLEX MICROSCOPIC    EKG EKG Interpretation  Date/Time:  Monday May 04 2022 11:27:51 EDT Ventricular Rate:  102 PR Interval:  146 QRS Duration: 108 QT Interval:  366 QTC Calculation: 477 R Axis:   -69 Text Interpretation: Sinus tachycardia Left anterior fascicular block Anterior infarct, old Nonspecific T abnormalities, lateral leads No significant change since last tracing Confirmed by Margaretmary Eddy [phone]) on 05/04/2022 11:50:30 AM  Radiology CT ABDOMEN PELVIS W CONTRAST  Result Date: 05/04/2022 CLINICAL DATA:  NAUSEA, VOMITING. ABDOMINAL PAIN SINCE LAST CHEMOTHERAPY 2 WEEKS AGO. EXAM: CT ABDOMEN AND PELVIS WITH CONTRAST TECHNIQUE: Multidetector CT imaging of the abdomen and pelvis was performed using the standard protocol following bolus administration of intravenous contrast. RADIATION DOSE REDUCTION: This exam was performed according to the departmental dose-optimization program which includes automated exposure control, adjustment of the mA and/or kV according to patient size and/or use of iterative reconstruction technique. CONTRAST:  63m OMNIPAQUE IOHEXOL 300 MG/ML  SOLN COMPARISON:  None Available. FINDINGS: Lower chest: No acute abnormality.  Bibasilar dependent  atelectasis. Hepatobiliary: No focal liver abnormality is seen. No gallstones, gallbladder wall thickening, or biliary dilatation. Pancreas: Mild pancreatic atrophy. No pancreatic ductal dilatation or surrounding inflammatory changes. Spleen: Normal in size without focal abnormality. Adrenals/Urinary Tract: Adrenal glands are unremarkable. Kidneys are normal, without renal calculi, focal lesion, or hydronephrosis. Small hypodense cysts in the lower pole of the left kidney. Bladder is unremarkable. Stomach/Bowel: Small hiatal hernia. Stomach is within normal limits. Appendix appears normal. There are multiple fluid-filled distended small bowel loops without evidence of obstruction, similar to prior examination. Vascular/Lymphatic: Aortic atherosclerosis. Right inguinal region necrotic mass measuring approximately 7.5 x 7.0 cm, not significantly changed. Another right inguinal lymph node along the right external iliac vessels measuring approximately 3.0 x 2.5 cm, also not significantly changed. Reproductive: Mildly enlarged prostate. Other: No abdominal wall hernia or abnormality. No abdominopelvic ascites. Musculoskeletal: No acute or significant osseous findings. IMPRESSION: 1. Multiple fluid-filled distended small bowel loops without evidence of obstruction, similar to prior examination, likely suggesting ileus. 2. Right inguinal region necrotic mass measuring approximately 7.5 x 7.0 cm, not significantly changed. Another right inguinal lymph node along the right external iliac vessels measuring approximately 3.0 x 2.5 cm, also not significantly changed. 3. Small hiatal hernia. 4. Aortic atherosclerosis. 5. Mildly enlarged prostate. Electronically Signed   By: IKeane PoliceD.O.   On: 05/04/2022 12:37    Procedures Procedures   Medications Ordered in ED Medications  sodium chloride (PF) 0.9 % injection (has no administration in time range)  HYDROmorphone (DILAUDID) injection 0.5 mg (0.5 mg Intravenous Given  05/04/22 1037)  ondansetron (ZOFRAN) injection 4 mg (4 mg Intravenous Given 05/04/22 1035)  iohexol (OMNIPAQUE) 300 MG/ML solution 80 mL (80 mLs Intravenous Contrast Given 05/04/22 1154)  oxyCODONE (Oxy IR/ROXICODONE) immediate release tablet 5 mg (5 mg Oral Given 05/04/22 1525)    ED Course/ Medical Decision Making/ A&P Clinical Course as of 05/04/22 1532  Mon May 04, 2022  1245 CT of the abdomen pelvis shows fluid-filled loops of bowel suggestive of an ileus. [RP]  1246 Creatinine(!): 1.62 At baseline [RP]    Clinical Course  User Index [RP] Fransico Meadow, MD                           Medical Decision Making Amount and/or Complexity of Data Reviewed Labs: ordered. Decision-making details documented in ED Course. Radiology: ordered.  Risk Prescription drug management.   Demarri Elie is a 71 y.o. 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  This patient presents to the ED for concern of complaints listed in HPI, this involves an extensive number of treatment options, and is a complaint that carries with it a high risk of complications and morbidity.   Initial Ddx:  Obstruction, ileus, gastritis, gastroenteritis, chemo-induced nausea vomiting  MDM:  Feel the patient likely has chemo-induced nausea and vomiting.  Did have a recent CT scan that did not show any abnormality aside from some thickening of the gastric outlet which could also be related to his symptoms.  Patient does have diffuse tenderness so will obtain CT scan to evaluate for obstruction and ileus.  No other infectious symptoms that would suggest gastroenteritis at this time so feel if imaging is negative likely is from his chemotherapy.  Plan:  Labs Zofran CT  abdomen pelvis with IV contrast  ED Summary:  CT scan did show ileus but no mechanical obstruction.  Patient's labs did not reveal any electrolyte abnormalities from his nausea and vomiting or that would be related to ileus.  Patient was able to tolerate p.o. in the emergency department without difficulty and was given a prescription for Reglan as this may be beneficial for his slowed bowel motility.  Instructed not to take this and Zofran at the same time.  We will have him follow-up with his primary doctor in 2 to 3 days and return precautions were discussed with him and his family prior to discharge.  Dispo: DC Home. Return precautions discussed including, but not limited to, those listed in the AVS. Allowed pt time to ask questions which were answered fully prior to dc.   Additional history obtained from family Records reviewed ED Visit Notes and Care Everywhere The following labs were independently interpreted: Chemistry and show CKD I independently reviewed the following imaging with scope of interpretation limited to determining acute life threatening conditions related to emergency care: CT Abdomen/Pelvis, which revealed  fluid levels concerning for possible ileus   I personally reviewed and interpreted the pt's EKG: see above for interpretation  I have reviewed the patients home medications and made adjustments as needed  Final Clinical Impression(s) / ED Diagnoses Final diagnoses:  Nausea and vomiting, unspecified vomiting type  Abdominal pain, unspecified abdominal location  Ileus ([place])    Rx / DC Orders ED Discharge Orders          Ordered    metoCLOPramide (REGLAN) 10 MG tablet  Every 6 hours PRN        05/04/22 1513              Fransico Meadow, MD 05/04/22 [phone]

## 2022-05-04 NOTE — ED Notes (Signed)
Pt in bed, pt denies nausea, pt states that his pain is an 8/10, but he is feeling better and doesn't want anything else for pain at this time.

## 2022-05-04 NOTE — ED Notes (Signed)
Pt in wheel chair, pt to and from bathroom, pt had large soft bm, pt states that he is ready to go home, d/c pt iv, cath intact, pt verbalized understanding d/c and follow up, pt from dpt.

## 2022-05-04 NOTE — ED Triage Notes (Signed)
Pt arrived via EMS, from home, c/o n/v and abd pain since last chemo 2 wks ago 10/2.

## 2022-05-04 NOTE — Discharge Instructions (Addendum)
Today you were seen in the emergency department for your nausea and vomiting and abdominal pain.    In the emergency department you had a CT scan that showed an ileus.  It is unclear what is causing this but may be related to your chemotherapy.    At home, please take the Reglan we have prescribed you for your nausea and vomiting.  Do not take this medication at the same time as the Zofran.  Check your MyChart online for the results of any tests that had not resulted by the time you left the emergency department.   Follow-up with your primary doctor in 2-3 days regarding your visit.    Return immediately to the emergency department if you experience any of the following: Worsening pain, nausea or vomiting, or any other concerning symptoms.    Thank you for visiting our Emergency Department. It was a pleasure taking care of you today.

## 2022-05-05 ENCOUNTER — Ambulatory Visit: Payer: Medicare (Managed Care)

## 2022-05-06 ENCOUNTER — Telehealth: Payer: Self-pay | Admitting: *Deleted

## 2022-05-06 ENCOUNTER — Ambulatory Visit: Payer: Medicare (Managed Care)

## 2022-05-06 ENCOUNTER — Encounter (INDEPENDENT_AMBULATORY_CARE_PROVIDER_SITE_OTHER): Payer: Medicare (Managed Care) | Admitting: Ophthalmology

## 2022-05-06 NOTE — Telephone Encounter (Signed)
This RN spoke with Antonio Guerra with [facility] per noted pt going to the ER x 2 over the past week due to un retractable nausea and vomiting.  Antonio Guerra stated she is concerned if above could be related to his chemo or his diagnosis and if pt needs to be evaluated sooner then scheduled appt on 10/24.  Per review with MD - symptoms not likely related to the [place] - but possible progression of disease.  Noted pt is scheduled for PET scan for restaging - which will assist in evaluation of current issues.

## 2022-05-07 ENCOUNTER — Telehealth: Payer: Self-pay | Admitting: *Deleted

## 2022-05-07 ENCOUNTER — Ambulatory Visit: Payer: Medicare (Managed Care)

## 2022-05-07 NOTE — Telephone Encounter (Signed)
This RN spoke with [place] at [facility] and informed her per MD review that pt's issues per ER seem to be related to bowels and possible mild ileus- which with last ER visit resolved post pt having a large soft stool.  MD is unsure why pt is developing ileus' and is scheduled for PET scan tomorrow- visit is scheduled for 10/24 - which will allow [place] to evaluate  for further recommendation.  This RN called and discussed with [employer]

## 2022-05-08 ENCOUNTER — Ambulatory Visit (HOSPITAL_COMMUNITY)
Admission: RE | Admit: 2022-05-08 | Discharge: 2022-05-08 | Disposition: A | Discharge status: Home / Self Care | Payer: Medicare (Managed Care) | Source: Ambulatory Visit | Attending: Hematology and Oncology | Admitting: Hematology and Oncology

## 2022-05-08 ENCOUNTER — Ambulatory Visit: Payer: Medicare (Managed Care)

## 2022-05-08 DIAGNOSIS — C7B1 Secondary Merkel cell carcinoma: Secondary | ICD-10-CM | POA: Insufficient documentation

## 2022-05-08 DIAGNOSIS — C4A9 Merkel cell carcinoma, unspecified: Secondary | ICD-10-CM | POA: Diagnosis present

## 2022-05-08 DIAGNOSIS — N179 Acute kidney failure, unspecified: Secondary | ICD-10-CM | POA: Diagnosis not present

## 2022-05-08 DIAGNOSIS — R109 Unspecified abdominal pain: Secondary | ICD-10-CM | POA: Diagnosis not present

## 2022-05-08 LAB — GLUCOSE, CAPILLARY: Glucose-Capillary: 123 mg/dL — ABNORMAL HIGH (ref 70–99)

## 2022-05-08 MED ORDER — FLUDEOXYGLUCOSE F - 18 (FDG) INJECTION
9.3000 | Freq: Once | INTRAVENOUS | Status: AC | PRN
  Administered 2022-05-08: 9.3 via INTRAVENOUS

## 2022-05-11 ENCOUNTER — Encounter (INDEPENDENT_AMBULATORY_CARE_PROVIDER_SITE_OTHER): Payer: Medicare (Managed Care) | Admitting: Ophthalmology

## 2022-05-11 ENCOUNTER — Inpatient Hospital Stay (HOSPITAL_COMMUNITY)
Admission: EM | Admit: 2022-05-11 | Discharge: 2022-05-15 | DRG: 683 | Disposition: A | Discharge status: Home / Self Care | Payer: Medicare (Managed Care) | Attending: Internal Medicine | Admitting: Internal Medicine

## 2022-05-11 ENCOUNTER — Ambulatory Visit: Payer: Medicare (Managed Care)

## 2022-05-11 ENCOUNTER — Emergency Department (HOSPITAL_COMMUNITY): Payer: Medicare (Managed Care)

## 2022-05-11 ENCOUNTER — Other Ambulatory Visit: Payer: Self-pay

## 2022-05-11 DIAGNOSIS — R1013 Epigastric pain: Secondary | ICD-10-CM | POA: Diagnosis present

## 2022-05-11 DIAGNOSIS — Z7902 Long term (current) use of antithrombotics/antiplatelets: Secondary | ICD-10-CM | POA: Diagnosis not present

## 2022-05-11 DIAGNOSIS — L89321 Pressure ulcer of left buttock, stage 1: Secondary | ICD-10-CM | POA: Diagnosis present

## 2022-05-11 DIAGNOSIS — F419 Anxiety disorder, unspecified: Secondary | ICD-10-CM | POA: Diagnosis not present

## 2022-05-11 DIAGNOSIS — R109 Unspecified abdominal pain: Secondary | ICD-10-CM | POA: Diagnosis present

## 2022-05-11 DIAGNOSIS — E785 Hyperlipidemia, unspecified: Secondary | ICD-10-CM | POA: Diagnosis present

## 2022-05-11 DIAGNOSIS — K209 Esophagitis, unspecified without bleeding: Secondary | ICD-10-CM

## 2022-05-11 DIAGNOSIS — N1832 Chronic kidney disease, stage 3b: Secondary | ICD-10-CM | POA: Diagnosis present

## 2022-05-11 DIAGNOSIS — E861 Hypovolemia: Secondary | ICD-10-CM | POA: Diagnosis present

## 2022-05-11 DIAGNOSIS — I252 Old myocardial infarction: Secondary | ICD-10-CM

## 2022-05-11 DIAGNOSIS — Z955 Presence of coronary angioplasty implant and graft: Secondary | ICD-10-CM | POA: Diagnosis not present

## 2022-05-11 DIAGNOSIS — K208 Other esophagitis without bleeding: Secondary | ICD-10-CM | POA: Diagnosis present

## 2022-05-11 DIAGNOSIS — E1165 Type 2 diabetes mellitus with hyperglycemia: Secondary | ICD-10-CM | POA: Diagnosis present

## 2022-05-11 DIAGNOSIS — C4A9 Merkel cell carcinoma, unspecified: Secondary | ICD-10-CM | POA: Diagnosis not present

## 2022-05-11 DIAGNOSIS — N189 Chronic kidney disease, unspecified: Principal | ICD-10-CM

## 2022-05-11 DIAGNOSIS — N179 Acute kidney failure, unspecified: Principal | ICD-10-CM | POA: Diagnosis present

## 2022-05-11 DIAGNOSIS — H43813 Vitreous degeneration, bilateral: Secondary | ICD-10-CM | POA: Diagnosis not present

## 2022-05-11 DIAGNOSIS — R112 Nausea with vomiting, unspecified: Secondary | ICD-10-CM

## 2022-05-11 DIAGNOSIS — R933 Abnormal findings on diagnostic imaging of other parts of digestive tract: Secondary | ICD-10-CM | POA: Diagnosis not present

## 2022-05-11 DIAGNOSIS — I2510 Atherosclerotic heart disease of native coronary artery without angina pectoris: Secondary | ICD-10-CM | POA: Diagnosis present

## 2022-05-11 DIAGNOSIS — I130 Hypertensive heart and chronic kidney disease with heart failure and stage 1 through stage 4 chronic kidney disease, or unspecified chronic kidney disease: Secondary | ICD-10-CM | POA: Diagnosis present

## 2022-05-11 DIAGNOSIS — I5022 Chronic systolic (congestive) heart failure: Secondary | ICD-10-CM | POA: Diagnosis present

## 2022-05-11 DIAGNOSIS — Z9221 Personal history of antineoplastic chemotherapy: Secondary | ICD-10-CM | POA: Diagnosis not present

## 2022-05-11 DIAGNOSIS — Z87891 Personal history of nicotine dependence: Secondary | ICD-10-CM | POA: Diagnosis not present

## 2022-05-11 DIAGNOSIS — N1831 Chronic kidney disease, stage 3a: Secondary | ICD-10-CM | POA: Diagnosis not present

## 2022-05-11 DIAGNOSIS — K2210 Ulcer of esophagus without bleeding: Secondary | ICD-10-CM | POA: Diagnosis present

## 2022-05-11 DIAGNOSIS — K2970 Gastritis, unspecified, without bleeding: Secondary | ICD-10-CM | POA: Diagnosis not present

## 2022-05-11 DIAGNOSIS — Z7985 Long-term (current) use of injectable non-insulin antidiabetic drugs: Secondary | ICD-10-CM | POA: Diagnosis not present

## 2022-05-11 DIAGNOSIS — H35033 Hypertensive retinopathy, bilateral: Secondary | ICD-10-CM

## 2022-05-11 DIAGNOSIS — Z833 Family history of diabetes mellitus: Secondary | ICD-10-CM

## 2022-05-11 DIAGNOSIS — I10 Essential (primary) hypertension: Secondary | ICD-10-CM

## 2022-05-11 DIAGNOSIS — E860 Dehydration: Secondary | ICD-10-CM | POA: Diagnosis not present

## 2022-05-11 DIAGNOSIS — E1122 Type 2 diabetes mellitus with diabetic chronic kidney disease: Secondary | ICD-10-CM | POA: Diagnosis present

## 2022-05-11 DIAGNOSIS — K449 Diaphragmatic hernia without obstruction or gangrene: Secondary | ICD-10-CM | POA: Diagnosis present

## 2022-05-11 DIAGNOSIS — R590 Localized enlarged lymph nodes: Secondary | ICD-10-CM | POA: Diagnosis present

## 2022-05-11 DIAGNOSIS — K2081 Other esophagitis with bleeding: Secondary | ICD-10-CM | POA: Diagnosis not present

## 2022-05-11 DIAGNOSIS — Z794 Long term (current) use of insulin: Secondary | ICD-10-CM

## 2022-05-11 DIAGNOSIS — E119 Type 2 diabetes mellitus without complications: Secondary | ICD-10-CM

## 2022-05-11 DIAGNOSIS — E1100 Type 2 diabetes mellitus with hyperosmolarity without nonketotic hyperglycemic-hyperosmolar coma (NKHHC): Secondary | ICD-10-CM | POA: Diagnosis not present

## 2022-05-11 DIAGNOSIS — M797 Fibromyalgia: Secondary | ICD-10-CM | POA: Diagnosis present

## 2022-05-11 DIAGNOSIS — Z8673 Personal history of transient ischemic attack (TIA), and cerebral infarction without residual deficits: Secondary | ICD-10-CM

## 2022-05-11 DIAGNOSIS — Z79899 Other long term (current) drug therapy: Secondary | ICD-10-CM | POA: Diagnosis not present

## 2022-05-11 DIAGNOSIS — K3189 Other diseases of stomach and duodenum: Secondary | ICD-10-CM | POA: Diagnosis not present

## 2022-05-11 DIAGNOSIS — E113513 Type 2 diabetes mellitus with proliferative diabetic retinopathy with macular edema, bilateral: Secondary | ICD-10-CM | POA: Diagnosis not present

## 2022-05-11 DIAGNOSIS — E782 Mixed hyperlipidemia: Secondary | ICD-10-CM | POA: Diagnosis not present

## 2022-05-11 LAB — CBC WITH DIFFERENTIAL/PLATELET
Abs Immature Granulocytes: 0.03 10*3/uL (ref 0.00–0.07)
Basophils Absolute: 0 10*3/uL (ref 0.0–0.1)
Basophils Relative: 0 %
Eosinophils Absolute: 0 10*3/uL (ref 0.0–0.5)
Eosinophils Relative: 0 %
HCT: 37.3 % — ABNORMAL LOW (ref 39.0–52.0)
Hemoglobin: 11.9 g/dL — ABNORMAL LOW (ref 13.0–17.0)
Immature Granulocytes: 1 %
Lymphocytes Relative: 15 %
Lymphs Abs: 1 10*3/uL (ref 0.7–4.0)
MCH: 27.7 pg (ref 26.0–34.0)
MCHC: 31.9 g/dL (ref 30.0–36.0)
MCV: 86.7 fL (ref 80.0–100.0)
Monocytes Absolute: 0.6 10*3/uL (ref 0.1–1.0)
Monocytes Relative: 9 %
Neutro Abs: 5 10*3/uL (ref 1.7–7.7)
Neutrophils Relative %: 75 %
Platelets: 298 10*3/uL (ref 150–400)
RBC: 4.3 MIL/uL (ref 4.22–5.81)
RDW: 15.9 % — ABNORMAL HIGH (ref 11.5–15.5)
WBC: 6.6 10*3/uL (ref 4.0–10.5)
nRBC: 0 % (ref 0.0–0.2)

## 2022-05-11 LAB — COMPREHENSIVE METABOLIC PANEL
ALT: 68 U/L — ABNORMAL HIGH (ref 0–44)
AST: 54 U/L — ABNORMAL HIGH (ref 15–41)
Albumin: 3.9 g/dL (ref 3.5–5.0)
Alkaline Phosphatase: 89 U/L (ref 38–126)
Anion gap: 15 (ref 5–15)
BUN: 35 mg/dL — ABNORMAL HIGH (ref 8–23)
CO2: 23 mmol/L (ref 22–32)
Calcium: 9.4 mg/dL (ref 8.9–10.3)
Chloride: 99 mmol/L (ref 98–111)
Creatinine, Ser: 2.66 mg/dL — ABNORMAL HIGH (ref 0.61–1.24)
GFR, Estimated: 25 mL/min — ABNORMAL LOW (ref >60)
Glucose, Bld: 168 mg/dL — ABNORMAL HIGH (ref 70–99)
Potassium: 4.3 mmol/L (ref 3.5–5.1)
Sodium: 137 mmol/L (ref 135–145)
Total Bilirubin: 1.3 mg/dL — ABNORMAL HIGH (ref 0.3–1.2)
Total Protein: 8.4 g/dL — ABNORMAL HIGH (ref 6.5–8.1)

## 2022-05-11 LAB — LIPASE, BLOOD: Lipase: 45 U/L (ref 11–51)

## 2022-05-11 MED ORDER — SODIUM CHLORIDE 0.9 % IV BOLUS
500.0000 mL | Freq: Once | INTRAVENOUS | Status: AC
  Administered 2022-05-11: 500 mL via INTRAVENOUS

## 2022-05-11 MED ORDER — HYDROMORPHONE HCL 1 MG/ML IJ SOLN
0.5000 mg | Freq: Once | INTRAMUSCULAR | Status: AC
  Administered 2022-05-11: 0.5 mg via INTRAVENOUS
  Filled 2022-05-11: qty 1

## 2022-05-11 MED ORDER — ONDANSETRON HCL 4 MG/2ML IJ SOLN
4.0000 mg | Freq: Once | INTRAMUSCULAR | Status: AC
  Administered 2022-05-11: 4 mg via INTRAVENOUS
  Filled 2022-05-11: qty 2

## 2022-05-11 MED ORDER — LACTATED RINGERS IV SOLN: INTRAVENOUS | Status: DC

## 2022-05-11 NOTE — ED Notes (Signed)
Labs for Ct

## 2022-05-11 NOTE — ED Notes (Signed)
Pts daughter states that she will be back at 0830 in the morning. [name] [phone]. She would like to be near when the oncologist comes to see him.

## 2022-05-11 NOTE — ED Triage Notes (Signed)
Ems brings pt in from home for abdominal pain, nausea, vomiting. Pt recently started new chemo drug.

## 2022-05-11 NOTE — ED Provider Notes (Signed)
[facility] DEPT Provider Note   CSN: [id number] Arrival date & time: 05/11/22  1845     History  Chief Complaint  Patient presents with   Abdominal Pain    Antonio Guerra is a 71 y.o. male.  HPI 71 year old male with a history of merkel cell carcinoma currently being treated with [place] presents with abdominal pain and vomiting.  This has essentially continued since last week.  He is also having a lot of pain in his right groin where he has the very enlarged lymph node.  He denies any fevers.  Abdominal pain is diffuse.  He has been taking the Zofran that was prescribed but its not helping.  Home Medications Prior to Admission medications   Medication Sig Start Date End Date Taking? Authorizing Provider  acetaminophen (TYLENOL) 650 MG CR tablet Take 650 mg by mouth every 8 (eight) hours as needed for pain.    [provider]  atorvastatin (LIPITOR) 80 MG tablet TAKE 1 TABLET(80 MG) BY MOUTH AT BEDTIME 05/19/21   [name], MD  Blood Glucose Monitoring Suppl (ONETOUCH VERIO) w/Device KIT 30 Units by Does not apply route daily. 11/02/17   Diallo, Earna Coder, MD  carvedilol (COREG) 12.5 MG tablet Take 12.5 mg by mouth 2 (two) times daily with a meal.    [provider]  clopidogrel (PLAVIX) 75 MG tablet TAKE 1 TABLET(75 MG) BY MOUTH DAILY 01/29/21   [name], MD  Continuous Blood Gluc Receiver (FREESTYLE LIBRE 14 DAY READER) DEVI 1 Device by Does not apply route every morning. 11/08/19   Bonnita Hollow, MD  cycloSPORINE (RESTASIS) 0.05 % ophthalmic emulsion Place 1 drop into both eyes 2 (two) times daily.    [provider]  Dulaglutide (TRULICITY) 3.89 HT/3.4KA SOPN Inject into the skin.    [provider]  gabapentin (NEURONTIN) 400 MG capsule TAKE 1 CAPSULE BY MOUTH TWICE DAILY 02/20/21   [name], MD  gabapentin (NEURONTIN) 400 MG capsule Take 400 mg by mouth at bedtime as needed (uncontrolled neuropathy).     [provider]  Incontinence Supply Disposable (DEPEND ADJUSTABLE UNDERWEAR LG) MISC 1 application by Does not apply route as needed. 08/09/20   [name], MD  insulin glargine (LANTUS SOLOSTAR) 100 UNIT/ML Solostar Pen Inject 40 Units into the skin daily. Patient taking differently: Inject 20 Units into the skin daily. 12/19/20   [name], MD  Insulin Pen Needle (BD PEN NEEDLE NANO U/F) 32G X 4 MM MISC AS DIRECTED TWICE DAILY 02/24/21   [name], MD  isosorbide mononitrate (ISMO) 10 MG tablet Take 10 mg by mouth 2 (two) times daily.    [provider]  Lancets [facility] ULTRASOFT) lancets USE AS DIRECTED 08/09/20   [name], MD  lidocaine (SALONPAS PAIN RELIEVING) 4 % Place 1 patch onto the skin daily as needed (pain).    [provider]  metoCLOPramide (REGLAN) 10 MG tablet Take 1 tablet (10 mg total) by mouth every 6 (six) hours as needed for up to 20 days for nausea. 05/04/22 05/24/22  Fransico Meadow, MD  nitroGLYCERIN (NITROSTAT) 0.4 MG SL tablet PLACE 1 TABLET UNDER THE TONGUE EVERY 5 MINS AS NEEDED FOR CHEST PAIN 04/08/21   [name], MD  [facility] VERIO test strip USE TO TEST BLOOD SUGAR 2 TO 3 TIMES A DAY 04/09/21   [name], MD  pantoprazole (PROTONIX) 20 MG tablet Take 1 tablet (20 mg total) by mouth daily for 14 days.  04/26/22 05/10/22  Jeanell Sparrow, DO  sucralfate (CARAFATE) 1 g tablet Take 1 tablet (1 g total) by mouth 4 (four) times daily -  with meals and at bedtime for 7 days. 04/26/22 05/03/22  Jeanell Sparrow, DO  traMADol (ULTRAM) 50 MG tablet Take 1 tablet (50 mg total) by mouth every 8 (eight) hours as needed. 02/16/22   Benay Pike, MD      Allergies    Lisinopril and Tape    Review of Systems   Review of Systems  Constitutional:  Negative for fever.  Gastrointestinal:  Positive for abdominal pain, diarrhea, nausea and vomiting.    Physical Exam Updated Vital Signs BP (!) 143/89   Pulse (!) 121   Temp (!) 97.2 F (36.2  C)   Resp 15   Ht $R'6\' 1"'BV$  (1.854 m)   Wt 88.9 kg   SpO2 98%   BMI 25.86 kg/m  Physical Exam Vitals and nursing note reviewed.  Constitutional:      Appearance: He is well-developed. He is not diaphoretic.  HENT:     Head: Normocephalic and atraumatic.  Cardiovascular:     Rate and Rhythm: Normal rate. Rhythm irregular.     Heart sounds: Normal heart sounds.  Pulmonary:     Effort: Pulmonary effort is normal.  Abdominal:     Palpations: Abdomen is soft.     Tenderness: There is generalized abdominal tenderness.  Skin:    General: Skin is warm and dry.  Neurological:     Mental Status: He is alert.     ED Results / Procedures / Treatments   Labs (all labs ordered are listed, but only abnormal results are displayed) Labs Reviewed  COMPREHENSIVE METABOLIC PANEL - Abnormal; Notable for the following components:      Result Value   Glucose, Bld 168 (*)    BUN 35 (*)    Creatinine, Ser 2.66 (*)    Total Protein 8.4 (*)    AST 54 (*)    ALT 68 (*)    Total Bilirubin 1.3 (*)    GFR, Estimated 25 (*)    All other components within normal limits  CBC WITH DIFFERENTIAL/PLATELET - Abnormal; Notable for the following components:   Hemoglobin 11.9 (*)    HCT 37.3 (*)    RDW 15.9 (*)    All other components within normal limits  LIPASE, BLOOD  URINALYSIS, ROUTINE W REFLEX MICROSCOPIC    EKG None  Radiology CT ABDOMEN PELVIS WO CONTRAST  Result Date: 05/11/2022 CLINICAL DATA:  Abdominal pain with nausea and vomiting. EXAM: CT ABDOMEN AND PELVIS WITHOUT CONTRAST TECHNIQUE: Multidetector CT imaging of the abdomen and pelvis was performed following the standard protocol without IV contrast. RADIATION DOSE REDUCTION: This exam was performed according to the departmental dose-optimization program which includes automated exposure control, adjustment of the mA and/or kV according to patient size and/or use of iterative reconstruction technique. COMPARISON:  May 04, 2022  FINDINGS: Lower chest: Mild atelectatic changes are seen within the right middle lobe and bilateral lung bases. Hepatobiliary: No focal liver abnormality is seen. No gallstones, gallbladder wall thickening, or biliary dilatation. Pancreas: Numerous tiny parenchymal calcifications are seen scattered throughout the pancreas. There is no evidence of pancreatic ductal dilatation or surrounding inflammatory fat stranding. Spleen: Normal in size without focal abnormality. Adrenals/Urinary Tract: Adrenal glands are unremarkable. Kidneys are normal in size, without obstructing renal calculi or hydronephrosis. 2 mm nonobstructing renal calculi are seen within both kidneys. Mild focal cortical  scarring is seen along the posterolateral aspect of the mid to lower left kidney. Bladder is unremarkable. Stomach/Bowel: There is a small, stable hiatal hernia. Appendix appears normal. Stool is seen throughout the large bowel. No evidence of bowel dilatation. Mild, asymmetric anterior and right lateral rectal wall thickening is seen (axial CT images 86 through 88, CT series 2). Vascular/Lymphatic: Aortic atherosclerosis. Multiple stable enlarged right pelvic and right inguinal lymph nodes are seen (the largest measuring approximately 3.3 cm x 2.8 cm), with a stable 7.3 cm x 7.5 cm necrotic right inguinal mass. Reproductive: The prostate gland is mildly enlarged and unchanged in size. Other: A stable 16 mm x 11 mm fat containing umbilical hernia is seen. No abdominopelvic ascites. Musculoskeletal: No acute or significant osseous findings. IMPRESSION: 1. Mild, asymmetric anterior and right lateral rectal wall thickening, as described above. While this may be, in part, secondary to underdistention, sequelae associated with an underlying neoplastic process cannot be excluded. Correlation with colonoscopy is recommended. 2. Stable right pelvic and right inguinal lymphadenopathy with a stable 7.3 cm x 7.5 cm necrotic right inguinal mass. 3.  Small, stable hiatal hernia. 4. Bilateral 2 mm nonobstructing renal calculi. 5. Aortic atherosclerosis. Aortic Atherosclerosis (ICD10-I70.0). Electronically Signed   By: Virgina Norfolk M.D.   On: 05/11/2022 21:16    Procedures Procedures    Medications Ordered in ED Medications  lactated ringers infusion (has no administration in time range)  sodium chloride 0.9 % bolus 500 mL (500 mLs Intravenous New Bag/Given 05/11/22 1957)  HYDROmorphone (DILAUDID) injection 0.5 mg (0.5 mg Intravenous Given 05/11/22 1958)  ondansetron (ZOFRAN) injection 4 mg (4 mg Intravenous Given 05/11/22 1958)    ED Course/ Medical Decision Making/ A&P                           Medical Decision Making Amount and/or Complexity of Data Reviewed Independent Historian:     Details: Daughter -notes that he has really been ill for the last couple days and this is a recurrent issue.  She is concerned about obstruction. Labs: ordered.    Details: Acute on chronic kidney injury with a creatinine of 2.66.  Normal WBC. Radiology: ordered and independent interpretation performed.    Details: CT without small bowel obstruction. ECG/medicine tests: ordered and independent interpretation performed.    Details: Sinus tachycardia.  Risk Prescription drug management. Decision regarding hospitalization.   Patient presents with recurrent vomiting and abdominal pain.  He was given IV medicines as above.  He was started on fluids.  Chart review shows he has systolic CHF we will be careful with fluids.  However he is also tachycardic which is indicative of some dehydration.  Pain seems to be better controlled.  CT was obtained and shows no SBO.  He has had multiple CTs but I am concerned given the acute on chronic kidney injury.  Otherwise, he appears stable for hospitalist admission.  Discussed with Dr. [place] Crane.  Updated daughter and patient at the bedside.        Final Clinical Impression(s) / ED Diagnoses Final diagnoses:   Acute kidney injury superimposed on chronic kidney disease [facility])    Rx / DC Orders ED Discharge Orders     None         Sherwood Gambler, MD 05/11/22 2248

## 2022-05-12 ENCOUNTER — Ambulatory Visit: Payer: Medicare (Managed Care)

## 2022-05-12 ENCOUNTER — Other Ambulatory Visit: Payer: Medicare (Managed Care)

## 2022-05-12 ENCOUNTER — Other Ambulatory Visit: Payer: Self-pay

## 2022-05-12 ENCOUNTER — Encounter (HOSPITAL_COMMUNITY): Payer: Self-pay | Admitting: Internal Medicine

## 2022-05-12 ENCOUNTER — Ambulatory Visit: Payer: Medicare (Managed Care) | Admitting: Hematology and Oncology

## 2022-05-12 DIAGNOSIS — N179 Acute kidney failure, unspecified: Secondary | ICD-10-CM

## 2022-05-12 DIAGNOSIS — R933 Abnormal findings on diagnostic imaging of other parts of digestive tract: Secondary | ICD-10-CM

## 2022-05-12 DIAGNOSIS — N189 Chronic kidney disease, unspecified: Secondary | ICD-10-CM

## 2022-05-12 DIAGNOSIS — R1013 Epigastric pain: Secondary | ICD-10-CM

## 2022-05-12 LAB — CBC WITH DIFFERENTIAL/PLATELET
Abs Immature Granulocytes: 0.02 10*3/uL (ref 0.00–0.07)
Basophils Absolute: 0 10*3/uL (ref 0.0–0.1)
Basophils Relative: 0 %
Eosinophils Absolute: 0 10*3/uL (ref 0.0–0.5)
Eosinophils Relative: 0 %
HCT: 30.4 % — ABNORMAL LOW (ref 39.0–52.0)
Hemoglobin: 9.9 g/dL — ABNORMAL LOW (ref 13.0–17.0)
Immature Granulocytes: 0 %
Lymphocytes Relative: 17 %
Lymphs Abs: 1.3 10*3/uL (ref 0.7–4.0)
MCH: 27.9 pg (ref 26.0–34.0)
MCHC: 32.6 g/dL (ref 30.0–36.0)
MCV: 85.6 fL (ref 80.0–100.0)
Monocytes Absolute: 1 10*3/uL (ref 0.1–1.0)
Monocytes Relative: 13 %
Neutro Abs: 5.3 10*3/uL (ref 1.7–7.7)
Neutrophils Relative %: 70 %
Platelets: 245 10*3/uL (ref 150–400)
RBC: 3.55 MIL/uL — ABNORMAL LOW (ref 4.22–5.81)
RDW: 15.9 % — ABNORMAL HIGH (ref 11.5–15.5)
WBC: 7.6 10*3/uL (ref 4.0–10.5)
nRBC: 0 % (ref 0.0–0.2)

## 2022-05-12 LAB — COMPREHENSIVE METABOLIC PANEL
ALT: 50 U/L — ABNORMAL HIGH (ref 0–44)
AST: 34 U/L (ref 15–41)
Albumin: 3.2 g/dL — ABNORMAL LOW (ref 3.5–5.0)
Alkaline Phosphatase: 71 U/L (ref 38–126)
Anion gap: 9 (ref 5–15)
BUN: 30 mg/dL — ABNORMAL HIGH (ref 8–23)
CO2: 28 mmol/L (ref 22–32)
Calcium: 8.7 mg/dL — ABNORMAL LOW (ref 8.9–10.3)
Chloride: 103 mmol/L (ref 98–111)
Creatinine, Ser: 2.06 mg/dL — ABNORMAL HIGH (ref 0.61–1.24)
GFR, Estimated: 34 mL/min — ABNORMAL LOW (ref >60)
Glucose, Bld: 128 mg/dL — ABNORMAL HIGH (ref 70–99)
Potassium: 3.8 mmol/L (ref 3.5–5.1)
Sodium: 140 mmol/L (ref 135–145)
Total Bilirubin: 1 mg/dL (ref 0.3–1.2)
Total Protein: 6.9 g/dL (ref 6.5–8.1)

## 2022-05-12 LAB — CBG MONITORING, ED
Glucose-Capillary: 120 mg/dL — ABNORMAL HIGH (ref 70–99)
Glucose-Capillary: 166 mg/dL — ABNORMAL HIGH (ref 70–99)

## 2022-05-12 LAB — MAGNESIUM: Magnesium: 2 mg/dL (ref 1.7–2.4)

## 2022-05-12 LAB — GLUCOSE, CAPILLARY
Glucose-Capillary: 149 mg/dL — ABNORMAL HIGH (ref 70–99)
Glucose-Capillary: 110 mg/dL — ABNORMAL HIGH (ref 70–99)

## 2022-05-12 LAB — PHOSPHORUS: Phosphorus: 4.6 mg/dL (ref 2.5–4.6)

## 2022-05-12 LAB — HEMOGLOBIN A1C
Hgb A1c MFr Bld: 5.3 % (ref 4.8–5.6)
Mean Plasma Glucose: 105.41 mg/dL

## 2022-05-12 MED ORDER — ACETAMINOPHEN 325 MG PO TABS: 650.0000 mg | ORAL_TABLET | ORAL | Status: DC | PRN

## 2022-05-12 MED ORDER — PANTOPRAZOLE SODIUM 40 MG PO TBEC: 40.0000 mg | DELAYED_RELEASE_TABLET | Freq: Every day | ORAL | Status: DC

## 2022-05-12 MED ORDER — SUCRALFATE 1 G PO TABS: 1.0000 g | ORAL_TABLET | Freq: Three times a day (TID) | ORAL | Status: DC

## 2022-05-12 MED ORDER — ENOXAPARIN SODIUM 40 MG/0.4ML IJ SOSY
40.0000 mg | PREFILLED_SYRINGE | INTRAMUSCULAR | Status: DC
  Administered 2022-05-12 – 2022-05-15 (×4): 40 mg via SUBCUTANEOUS
  Filled 2022-05-12 (×4): qty 0.4

## 2022-05-12 MED ORDER — PANTOPRAZOLE SODIUM 20 MG PO TBEC: 20.0000 mg | DELAYED_RELEASE_TABLET | Freq: Every day | ORAL | Status: DC

## 2022-05-12 MED ORDER — POLYETHYLENE GLYCOL 3350 17 G PO PACK: 17.0000 g | PACK | Freq: Every day | ORAL | Status: DC | PRN

## 2022-05-12 MED ORDER — CYCLOSPORINE 0.05 % OP EMUL
1.0000 [drp] | Freq: Two times a day (BID) | OPHTHALMIC | Status: DC
  Administered 2022-05-12 – 2022-05-15 (×6): 1 [drp] via OPHTHALMIC
  Filled 2022-05-12 (×7): qty 30

## 2022-05-12 MED ORDER — ISOSORBIDE MONONITRATE 10 MG PO TABS
10.0000 mg | ORAL_TABLET | Freq: Every day | ORAL | Status: DC
  Administered 2022-05-12 – 2022-05-15 (×3): 10 mg via ORAL
  Filled 2022-05-12 (×4): qty 1

## 2022-05-12 MED ORDER — INSULIN ASPART 100 UNIT/ML IJ SOLN
0.0000 [IU] | Freq: Three times a day (TID) | INTRAMUSCULAR | Status: DC
  Administered 2022-05-14 – 2022-05-15 (×3): 1 [IU] via SUBCUTANEOUS
  Filled 2022-05-12: qty 0.09

## 2022-05-12 MED ORDER — FLEET ENEMA 7-19 GM/118ML RE ENEM
2.0000 | ENEMA | Freq: Once | RECTAL | Status: AC
  Administered 2022-05-13: 2 via RECTAL
  Filled 2022-05-12: qty 2

## 2022-05-12 MED ORDER — LACTATED RINGERS IV SOLN: INTRAVENOUS | Status: DC

## 2022-05-12 MED ORDER — ONDANSETRON HCL 4 MG PO TABS: 4.0000 mg | ORAL_TABLET | Freq: Four times a day (QID) | ORAL | Status: DC | PRN

## 2022-05-12 MED ORDER — ONDANSETRON HCL 4 MG/2ML IJ SOLN: 4.0000 mg | Freq: Four times a day (QID) | INTRAMUSCULAR | Status: DC | PRN

## 2022-05-12 MED ORDER — PANTOPRAZOLE SODIUM 40 MG IV SOLR
40.0000 mg | INTRAVENOUS | Status: DC
  Administered 2022-05-13: 40 mg via INTRAVENOUS
  Filled 2022-05-12: qty 10

## 2022-05-12 MED ORDER — GABAPENTIN 400 MG PO CAPS
400.0000 mg | ORAL_CAPSULE | Freq: Two times a day (BID) | ORAL | Status: DC
  Administered 2022-05-12 – 2022-05-15 (×5): 400 mg via ORAL
  Filled 2022-05-12 (×5): qty 1

## 2022-05-12 MED ORDER — HYDROMORPHONE HCL 1 MG/ML IJ SOLN
1.0000 mg | INTRAMUSCULAR | Status: DC | PRN
  Administered 2022-05-12 – 2022-05-15 (×6): 1 mg via INTRAVENOUS
  Filled 2022-05-12 (×5): qty 1

## 2022-05-12 MED ORDER — ENOXAPARIN SODIUM 30 MG/0.3ML IJ SOSY: 30.0000 mg | PREFILLED_SYRINGE | INTRAMUSCULAR | Status: DC

## 2022-05-12 MED ORDER — PROCHLORPERAZINE EDISYLATE 10 MG/2ML IJ SOLN: 5.0000 mg | Freq: Four times a day (QID) | INTRAMUSCULAR | Status: DC | PRN

## 2022-05-12 MED ORDER — PROCHLORPERAZINE EDISYLATE 10 MG/2ML IJ SOLN
10.0000 mg | Freq: Four times a day (QID) | INTRAMUSCULAR | Status: DC | PRN
  Administered 2022-05-12: 10 mg via INTRAVENOUS
  Filled 2022-05-12: qty 2

## 2022-05-12 MED ORDER — CARVEDILOL 12.5 MG PO TABS
12.5000 mg | ORAL_TABLET | Freq: Two times a day (BID) | ORAL | Status: DC
  Administered 2022-05-13 – 2022-05-15 (×4): 12.5 mg via ORAL
  Filled 2022-05-12 (×4): qty 1

## 2022-05-12 MED ORDER — INSULIN ASPART 100 UNIT/ML IJ SOLN
0.0000 [IU] | Freq: Every day | INTRAMUSCULAR | Status: DC
  Filled 2022-05-12: qty 0.05

## 2022-05-12 MED ORDER — MELATONIN 5 MG PO TABS: 5.0000 mg | ORAL_TABLET | Freq: Every evening | ORAL | Status: DC | PRN

## 2022-05-12 MED ORDER — LABETALOL HCL 5 MG/ML IV SOLN: 10.0000 mg | INTRAVENOUS | Status: DC | PRN

## 2022-05-12 NOTE — Consult Note (Addendum)
  Consultation  Referring Provider: TRH/ Rai Primary Care Physician:  Koehler, Robert N, MD Primary Gastroenterologist:  Dr.Beavers  Reason for Consultation: Nausea vomiting abdominal pain and abnormal CT imaging of the rectum  HPI: Antonio Guerra is a 71 y.o. 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  He is being followed by oncology, and has been on Keytruda.  He has completed 3 courses. Most recent PET scan which was just done last week on 05/08/2022 shows increasing size of right pelvic adenopathy and right groin mass, new right pelvic sidewall adenopathy, mass in the right groin measuring 7.6 x 7.7 cm with some signs of central necrosis, adjacent lymph nodes have also increased in size, no focal hypermetabolic activities to suggest skeletal metastases, small focus of soft tissue density in the left lower quadrant in the inguinal region associated with the dermis, noted extensive coronary artery disease.. Patient's daughter relates that he had just developed issues with recurrent nausea vomiting and ongoing abdominal pain over the past month and that they have had for ER visits.  Came back to the emergency room last night with persistent nausea and vomiting and abdominal pain.  He is also complaining of a lot of pain in his right groin down into his right leg.  Abdominal pain appears to be rather diffuse and nonfocal.  On further questioning he also is complaining of some odynophagia.  He has not had any evidence of hematemesis or melena, no diarrhea. Repeat imaging with CT abdomen and pelvis without contrast again shows mild asymmetric anterior and right lateral rectal wall thickening, may be secondary to under distention, however sequelae associated with underlying neoplastic process cannot be excluded, stable right pelvic and right inguinal lymphadenopathy and stable right necrotic inguinal mass, aortic  atherosclerosis.  WBC 7.6/hemoglobin 9.9/hematocrit 30.4 Glucose 128/BUN 30/creatinine 2.06 Hemoglobin A1c 5.3   Patient has not had prior GI evaluation, he had been seen once in our office in 2021 and colonoscopy was discussed, but not followed through with.  Patient has no current complaints of rectal pain.  Other comorbidities include congestive heart failure, coronary artery disease status post prior stents, fibromyalgia, hypertension, previous MI, prior CVA And adult onset diabetes mellitus. Most recent EF 30-35% 03/2022    Past Medical History:  Diagnosis Date   Altered mental status    Arthritis    "all over" (12/20/2015)   Chronic lower back pain    Chronic systolic congestive heart failure (HCC)    a. 04/2014 Echo: EF 20-25%.   Coronary artery disease    a. 2011 MI x 2 with PCI: stent x 2 (LAD and RI) @ [facility] in [place], [place];  b. 04/2014 Cath/PCI: LM 10-20, LAD 40p, 60m, 80m ISR(3.5x38 Xience DES), 50apical, LCX 20 diffuse, RI 50p, 20m ISR, RCA 40-50m, 30d.   Fibromyalgia    Hyperlipidemia    Hypertension    Hypertensive urgency 09/27/2015   Myocardial infarction (HCC) 2015   Stroke (HCC) 2012   "right hand weaker since" (12/20/2015)   Type II diabetes mellitus (HCC)     Past Surgical History:  Procedure Laterality Date   ANKLE FRACTURE SURGERY Right    CARDIAC CATHETERIZATION N/A 09/30/2015   Procedure: Left Heart Cath and Coronary Angiography;  Surgeon: Christopher D McAlhany, MD;  Location: [facility] INVASIVE CV LAB;  Service: Cardiovascular;  Laterality: N/A;   CORONARY ANGIOPLASTY WITH STENT PLACEMENT     "total of   3 stents" (12/20/2015)   FRACTIONAL FLOW RESERVE WIRE  05/11/2014   Procedure: FRACTIONAL FLOW RESERVE WIRE;  Surgeon: Burnell Blanks, MD;  Location: [facility] CATH LAB;  Service: Cardiovascular;;   FRACTURE SURGERY     LEFT HEART CATHETERIZATION WITH CORONARY/GRAFT ANGIOGRAM N/A 05/11/2014   Procedure: LEFT HEART CATHETERIZATION WITH Beatrix Fetters;  Surgeon: Burnell Blanks, MD;  Location: [facility] CATH LAB;  Service: Cardiovascular;  Laterality: N/A;   PERCUTANEOUS CORONARY STENT INTERVENTION (PCI-S)  05/11/2014   Procedure: PERCUTANEOUS CORONARY STENT INTERVENTION (PCI-S);  Surgeon: Burnell Blanks, MD;  Location: [facility] CATH LAB;  Service: Cardiovascular;;    Prior to Admission medications   Medication Sig Start Date End Date Taking? Authorizing Provider  acetaminophen (TYLENOL) 650 MG CR tablet Take 650 mg by mouth every 8 (eight) hours as needed for pain.   Yes [provider]  alum & mag hydroxide-simeth (MAALOX/MYLANTA) 200-200-20 MG/5ML suspension Take 15 mLs by mouth every 4 (four) hours as needed for indigestion or heartburn.   Yes [provider]  atorvastatin (LIPITOR) 80 MG tablet TAKE 1 TABLET(80 MG) BY MOUTH AT BEDTIME Patient taking differently: Take 80 mg by mouth at bedtime. 05/19/21  Yes [name], MD  carvedilol (COREG) 12.5 MG tablet Take 12.5 mg by mouth 2 (two) times daily with a meal.   Yes [provider]  clopidogrel (PLAVIX) 75 MG tablet TAKE 1 TABLET(75 MG) BY MOUTH DAILY Patient taking differently: Take 75 mg by mouth daily. 01/29/21  Yes [name], MD  cycloSPORINE (RESTASIS) 0.05 % ophthalmic emulsion Place 1 drop into both eyes 2 (two) times daily.   Yes [provider]  Dulaglutide (TRULICITY) 5.62 BW/3.8LH SOPN Inject 0.75 mg into the skin once a week. Friday or saturday   Yes [provider]  gabapentin (NEURONTIN) 400 MG capsule TAKE 1 CAPSULE BY MOUTH TWICE DAILY Patient taking differently: Take 400 mg by mouth 2 (two) times daily. 02/20/21  Yes [name], MD  gabapentin (NEURONTIN) 400 MG capsule Take 800 mg by mouth at bedtime as needed (uncontrolled neuropathy).   Yes [provider]  isosorbide mononitrate (ISMO) 10 MG tablet Take 10 mg by mouth daily.   Yes [provider]  lidocaine (SALONPAS PAIN RELIEVING) 4 %  Place 1 patch onto the skin daily as needed (pain).   Yes [provider]  metoCLOPramide (REGLAN) 10 MG tablet Take 1 tablet (10 mg total) by mouth every 6 (six) hours as needed for up to 20 days for nausea. 05/04/22 05/24/22 Yes Fransico Meadow, MD  nitroGLYCERIN (NITROSTAT) 0.4 MG SL tablet PLACE 1 TABLET UNDER THE TONGUE EVERY 5 MINS AS NEEDED FOR CHEST PAIN Patient taking differently: Place 0.4 mg under the tongue every 5 (five) minutes as needed for chest pain. 04/08/21  Yes [name], MD  ondansetron (ZOFRAN) 4 MG tablet Take 4 mg by mouth every 4 (four) hours as needed for nausea or vomiting.   Yes [provider]  pantoprazole (PROTONIX) 20 MG tablet Take 1 tablet (20 mg total) by mouth daily for 14 days. 04/26/22 05/12/22 Yes Wynona Dove A, DO  Blood Glucose Monitoring Suppl (ONETOUCH VERIO) w/Device KIT 30 Units by Does not apply route daily. 11/02/17   Diallo, Earna Coder, MD  Continuous Blood Gluc Receiver (FREESTYLE LIBRE 14 DAY READER) DEVI 1 Device by Does not apply route every morning. 11/08/19   Bonnita Hollow, MD  Incontinence Supply Disposable (DEPEND ADJUSTABLE UNDERWEAR LG) MISC 1 application by Does  not apply route as needed. 08/09/20   Sun, Richard, MD  insulin glargine (LANTUS SOLOSTAR) 100 UNIT/ML Solostar Pen Inject 40 Units into the skin daily. Patient not taking: Reported on 05/12/2022 12/19/20   Sun, Richard, MD  Insulin Pen Needle (BD PEN NEEDLE NANO U/F) 32G X 4 MM MISC AS DIRECTED TWICE DAILY 02/24/21   Sun, Richard, MD  Lancets (ONETOUCH ULTRASOFT) lancets USE AS DIRECTED 08/09/20   Sun, Richard, MD  ONETOUCH VERIO test strip USE TO TEST BLOOD SUGAR 2 TO 3 TIMES A DAY 04/09/21   Sun, Richard, MD  sucralfate (CARAFATE) 1 g tablet Take 1 tablet (1 g total) by mouth 4 (four) times daily -  with meals and at bedtime for 7 days. 04/26/22 05/03/22  Gray, Samuel A, DO  traMADol (ULTRAM) 50 MG tablet Take 1 tablet (50 mg total) by mouth every 8 (eight) hours as  needed. Patient not taking: Reported on 05/12/2022 02/16/22   Iruku, Praveena, MD    Current Facility-Administered Medications  Medication Dose Route Frequency Provider Last Rate Last Admin   acetaminophen (TYLENOL) tablet 650 mg  650 mg Oral Q4H PRN Rai, Ripudeep K, MD       carvedilol (COREG) tablet 12.5 mg  12.5 mg Oral BID WC Rai, Ripudeep K, MD       cycloSPORINE (RESTASIS) 0.05 % ophthalmic emulsion 1 drop  1 drop Both Eyes BID Rai, Ripudeep K, MD   1 drop at 05/12/22 1312   enoxaparin (LOVENOX) injection 40 mg  40 mg Subcutaneous Q24H Hall, Carole N, DO   40 mg at 05/12/22 0932   gabapentin (NEURONTIN) capsule 400 mg  400 mg Oral BID Rai, Ripudeep K, MD   400 mg at 05/12/22 1309   HYDROmorphone (DILAUDID) injection 1 mg  1 mg Intravenous Q4H PRN Rai, Ripudeep K, MD   1 mg at 05/12/22 1324   insulin aspart (novoLOG) injection 0-5 Units  0-5 Units Subcutaneous QHS Hall, Carole N, DO       insulin aspart (novoLOG) injection 0-9 Units  0-9 Units Subcutaneous TID WC Hall, Carole N, DO       isosorbide mononitrate (ISMO) tablet 10 mg  10 mg Oral Daily Rai, Ripudeep K, MD   10 mg at 05/12/22 1310   labetalol (NORMODYNE) injection 10 mg  10 mg Intravenous Q4H PRN Rai, Ripudeep K, MD       lactated ringers infusion   Intravenous Continuous Rai, Ripudeep K, MD 100 mL/hr at 05/12/22 1308 New Bag at 05/12/22 1308   melatonin tablet 5 mg  5 mg Oral QHS PRN Hall, Carole N, DO       ondansetron (ZOFRAN) injection 4 mg  4 mg Intravenous Q6H PRN Esterwood, Amy S, PA-C       pantoprazole (PROTONIX) injection 40 mg  40 mg Intravenous Q24H Esterwood, Amy S, PA-C       polyethylene glycol (MIRALAX / GLYCOLAX) packet 17 g  17 g Oral Daily PRN Hall, Carole N, DO       prochlorperazine (COMPAZINE) injection 10 mg  10 mg Intravenous Q6H PRN Rai, Ripudeep K, MD       [START ON 05/13/2022] sodium phosphate (FLEET) 7-19 GM/118ML enema 2 enema  2 enema Rectal Once Esterwood, Amy S, PA-C       sucralfate (CARAFATE)  tablet 1 g  1 g Oral TID WC & HS Rai, Ripudeep K, MD        Allergies as of 05/11/2022 - Review Complete 05/11/2022    Allergen Reaction Noted   Lisinopril Other (See Comments) 03/11/2021   Tape Itching and Rash 12/18/2015    Family History  Problem Relation Age of Onset   Diabetes Mother    CAD Neg Hx    Stomach cancer Neg Hx    Colon cancer Neg Hx    Esophageal cancer Neg Hx    Pancreatic cancer Neg Hx     Social History   Socioeconomic History   Marital status: Married    Spouse name: Not on file   Number of children: Not on file   Years of education: Not on file   Highest education level: Not on file  Occupational History   Occupation: retired   Tobacco Use   Smoking status: Former   Smokeless tobacco: Never   Tobacco comments:    12/20/2015 "smoked cigarettes 30-40yr ago"  Vaping Use   Vaping Use: Never used  Substance and Sexual Activity   Alcohol use: Not Currently    Alcohol/week: 5.0 standard drinks of alcohol    Types: 5 Cans of beer per week    Comment: stopped 4 yrs ago    Drug use: No   Sexual activity: Not Currently    Partners: Female  Other Topics Concern   Not on file  Social History Narrative   Current Social History        Who lives at home: lives with daughter [name] 02/20/2021    Who would speak for you about health care matters: [name] 02/20/2021    Transportation: Has [employer] and daughter takes to appointments 02/20/2021   Current Stressors: wife and daughter passed away in 2022 02/20/2021   Work / Education:  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   Other: limited mobility uses cane and walker and memory decline ( per daughter)  02/20/2021                                                                                                       Social Determinants of Health   Financial Resource Strain: Low Risk  (03/11/2021)   Overall Financial Resource Strain (CARDIA)    Difficulty of Paying Living Expenses: Not hard at  all  Food Insecurity: No Food Insecurity (03/11/2021)   Hunger Vital Sign    Worried About Running Out of Food in the Last Year: Never true    Ran Out of Food in the Last Year: Never true  Transportation Needs: No Transportation Needs (03/11/2021)   PRAPARE - Transportation    Lack of Transportation (Medical): No    Lack of Transportation (Non-Medical): No  Physical Activity: Inactive (03/11/2021)   Exercise Vital Sign    Days of Exercise per Week: 0 days    Minutes of Exercise per Session: 0 min  Stress: Stress Concern Present (03/11/2021)   [employer] - Occupational Stress Questionnaire    Feeling of Stress : Rather much  Social Connections: Socially Isolated (03/11/2021)   Social Connection and Isolation Panel [NHANES]    Frequency of Communication with Friends and Family: Once a   week    Frequency of Social Gatherings with Friends and Family: Once a week    Attends Religious Services: Never    Active Member of Clubs or Organizations: No    Attends Club or Organization Meetings: Never    Marital Status: Widowed  Intimate Partner Violence: Not At Risk (03/11/2021)   Humiliation, Afraid, Rape, and Kick questionnaire    Fear of Current or Ex-Partner: No    Emotionally Abused: No    Physically Abused: No    Sexually Abused: No    Review of Systems: Pertinent positive and negative review of systems were noted in the above HPI section.  All other review of systems was otherwise negative.   Physical Exam: Vital signs in last 24 hours: Temp:  [97.2 F (36.2 C)-99.1 F (37.3 C)] 99.1 F (37.3 C) (10/24 1227) Pulse Rate:  [29-126] 113 (10/24 1227) Resp:  [14-18] 18 (10/24 1227) BP: (126-169)/(71-95) 169/93 (10/24 1227) SpO2:  [94 %-100 %] 97 % (10/24 1227) Weight:  [88.9 kg] 88.9 kg (10/23 1858) Last BM Date :  (unknown) General:   Alert,  Well-developed, chronically ill-appearing elderly African-American male pleasant and cooperative in  NAD-uncomfortable appearing, daughter at bedside Head:  Normocephalic and atraumatic. Eyes:  Sclera clear, no icterus.   Conjunctiva pink. Ears:  Normal auditory acuity. Nose:  No deformity, discharge,  or lesions. Mouth:  No deformity or lesions.   Neck:  Supple; no masses or thyromegaly. Lungs:  Clear throughout to auscultation.   No wheezes, crackles, or rhonchi. Heart:  irRegular rate and rhythm; no murmurs, clicks, rubs,  or gallops. Abdomen:  Soft,, mild rather generalized tenderness, he is also tender in the right lower quadrant/right groin and has a large firm mass in the right inguinal area ,BS active,nonpalp mass or hsm.   Rectal: Not done Msk:  Symmetrical without gross deformities. . Pulses:  Normal pulses noted. Extremities:  Without clubbing or edema. Neurologic:  Alert and  oriented x4;  grossly normal neurologically. Skin:  Intact without significant lesions or rashes.. Psych:  Alert and cooperative. Normal mood and affect.  Intake/Output from previous day: 10/23 0701 - 10/24 0700 In: 500 [IV Piggyback:500] Out: -  Intake/Output this shift: Total I/O In: 976.4 [I.V.:976.4] Out: -   Lab Results: Recent Labs    05/11/22 1922 05/12/22 0459  WBC 6.6 7.6  HGB 11.9* 9.9*  HCT 37.3* 30.4*  PLT 298 245   BMET Recent Labs    05/11/22 1922 05/12/22 0459  NA 137 140  K 4.3 3.8  CL 99 103  CO2 23 28  GLUCOSE 168* 128*  BUN 35* 30*  CREATININE 2.66* 2.06*  CALCIUM 9.4 8.7*   LFT Recent Labs    05/12/22 0459  PROT 6.9  ALBUMIN 3.2*  AST 34  ALT 50*  ALKPHOS 71  BILITOT 1.0   PT/INR No results for input(s): "LABPROT", "INR" in the last 72 hours. Hepatitis Panel No results for input(s): "HEPBSAG", "HCVAB", "HEPAIGM", "HEPBIGM" in the last 72 hours.   IMPRESSION:  #1  71 yo AA male undergoing chemotherapy with Keytruda for a recently diagnosed Merkel cell carcinoma presenting with a right inguinal mass. Very recent PET scan last week shows  increasing right pelvic adenopathy and new right pelvic sidewall adenopathy, as well as increasing size and hypermetabolic changes related to the lymph nodes adjacent to the dominant right groin mass, the dominant mass itself did show some interval decrease in FDG uptake.  He has been having problems with   recurrent nausea vomiting and complaints of abdominal pain over the past month, of uncertain etiology though may be related to chemotherapy. His abdominal pain is nonfocal  Noncontrasted CT of the abdomen and pelvis yesterday showed stable right pelvic and right inguinal lymphadenopathy, no evidence of bowel obstruction, or small bowel pathology, there was mention of right lateral rectal wall thickening though this may be due to under distention, consideration of sequelae of neoplastic process could not be excluded.  Patient is having considerable pain from the right groin and right pelvic area with neuropathic pain into the right lower extremity, he has no complaints of rectal pain or discomfort.  Consider rectal involvement with Merkel cell cancer versus under distention on noncontrasted CT   #2   Acute kidney injury superimposed on chronic kidney disease 3 ,secondary to above #3 prior CVA #4 coronary artery disease status post prior stents-not on anticoagulation #5 congestive heart failure EF 30 to 35% #6 adult onset diabetes mellitus  PLAN: Clear liquid diet, n.p.o. in a.m. Have change Protonix to 40 mg IV daily Change Zofran to 4 mg IV every 6 hours as needed for nausea Pain management as per hospitalist From GI perspective, need to rule out peptic ulcer disease, severe gastropathy, esophagitis, and therefore will plan for EGD tomorrow with Dr. Hilarie Fredrickson.  We will also plan for flexible sigmoidoscopy at the same setting to evaluate the rectal wall thickening noted on CT. Procedures were discussed in detail with the patient and his daughter including indications risk and benefits and they  are agreeable to proceed.   Jerline Linzy PA-C 05/12/2022, 4:07 PM

## 2022-05-12 NOTE — ED Notes (Signed)
Per patient daughter refused insulin states they have d/c his home insulin and wishes to keep him off

## 2022-05-12 NOTE — Progress Notes (Signed)
Treatment plan changed given progression.

## 2022-05-12 NOTE — H&P (Addendum)
History and Physical  Antonio Guerra:[id number] DOB: Jun 27, 1951 DOA: 05/11/2022  Referring physician: Dr. Regenia Skeeter, [place]  PCP: [name], MD  Outpatient Specialists: Medical oncology. Patient coming from: Home.  Chief Complaint: Abdominal pain.  HPI: Antonio Guerra is a 71 y.o. 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  Associated with nausea and vomiting.  No reported subjective fevers.  Nausea and vomiting not improved with home Zofran.  Work-up in the ED revealed no evidence of obstruction on CT scan.  CT scan showed stable right pelvic and right inguinal lymphadenopathy with a stable 7.3 cm x 7.5 cm necrotic right inguinal mass.  Mild, symmetric anterior and right lateral rectal wall thickening associated with an underlying neoplastic process cannot be excluded.  Correlation with colonoscopy is recommended.  Lab studies were remarkable for worsening renal insufficiency.  The patient received normal saline IV fluid bolus 500 cc x 1 and was started on maintenance IV fluid LR at 75 cc/h.  TRH, hospitalist service was asked to admit for AKI.  ED Course: Tmax 99.1.  BP 132/72, pulse 107, respiratory 17, O2 saturation 96% on room air.  Lab studies remarkable for serum glucose 168, BUN 35, creatinine 2.66 from 1.6 at baseline, AST 54, ALT 68, T. bili 1.3.  GFR 25.  Hemoglobin 11.9.  Review of Systems: Review of systems as noted in the HPI. All other systems reviewed and are negative.   Past Medical History:  Diagnosis Date   Altered mental status    Arthritis    "all over" (12/20/2015)   Chronic lower back pain    Chronic systolic congestive heart failure ([place])    a. 04/2014 Echo: EF 20-25%.   Coronary artery disease    a. 2011  MI x 2 with PCI: stent x 2 (LAD and RI) @ [facility] in [place], [place];  b. 04/2014 Cath/PCI: LM 10-20, LAD 40p, 48m, 33m ISR(3.5x38 Xience DES), 50apical, LCX 20 diffuse, RI 50p, 60m ISR, RCA 40-9m, 30d.   Fibromyalgia    Hyperlipidemia    Hypertension    Hypertensive urgency 09/27/2015   Myocardial infarction [facility]) 2015   Stroke [facility]) 2012   "right hand weaker since" (12/20/2015)   Type II diabetes mellitus ([place])    Past Surgical History:  Procedure Laterality Date   ANKLE FRACTURE SURGERY Right    CARDIAC CATHETERIZATION N/A 09/30/2015   Procedure: Left Heart Cath and Coronary Angiography;  Surgeon: Burnell Blanks, MD;  Location: [place] CV LAB;  Service: Cardiovascular;  Laterality: N/A;   CORONARY ANGIOPLASTY WITH STENT PLACEMENT     "total of 3 stents" (12/20/2015)   FRACTIONAL FLOW RESERVE WIRE  05/11/2014   Procedure: FRACTIONAL FLOW RESERVE WIRE;  Surgeon: Burnell Blanks, MD;  Location: [facility] CATH LAB;  Service: Cardiovascular;;   FRACTURE SURGERY     LEFT HEART CATHETERIZATION WITH CORONARY/GRAFT ANGIOGRAM N/A 05/11/2014   Procedure: LEFT HEART CATHETERIZATION WITH Beatrix Fetters;  Surgeon: Burnell Blanks, MD;  Location: [facility] CATH LAB;  Service: Cardiovascular;  Laterality: N/A;   PERCUTANEOUS CORONARY STENT INTERVENTION (PCI-S)  05/11/2014   Procedure: PERCUTANEOUS CORONARY STENT INTERVENTION (PCI-S);  Surgeon: Burnell Blanks, MD;  Location: [facility] CATH LAB;  Service: Cardiovascular;;    Social History:  reports that he has quit smoking. He  has never used smokeless tobacco. He reports that he does not currently use alcohol after a past usage of about 5.0 standard drinks of alcohol per week. He reports that he does not use drugs.   Allergies  Allergen Reactions   Lisinopril Other (See Comments)    Caused pt to have chest pains.    Tape Itching and Rash    Family History  Problem Relation Age of Onset   Diabetes Mother    CAD Neg Hx     Stomach cancer Neg Hx    Colon cancer Neg Hx    Esophageal cancer Neg Hx    Pancreatic cancer Neg Hx       Prior to Admission medications   Medication Sig Start Date End Date Taking? Authorizing Provider  acetaminophen (TYLENOL) 650 MG CR tablet Take 650 mg by mouth every 8 (eight) hours as needed for pain.    [provider]  atorvastatin (LIPITOR) 80 MG tablet TAKE 1 TABLET(80 MG) BY MOUTH AT BEDTIME 05/19/21   [name], MD  Blood Glucose Monitoring Suppl (ONETOUCH VERIO) w/Device KIT 30 Units by Does not apply route daily. 11/02/17   Diallo, Earna Coder, MD  carvedilol (COREG) 12.5 MG tablet Take 12.5 mg by mouth 2 (two) times daily with a meal.    [provider]  clopidogrel (PLAVIX) 75 MG tablet TAKE 1 TABLET(75 MG) BY MOUTH DAILY 01/29/21   [name], MD  Continuous Blood Gluc Receiver (FREESTYLE LIBRE 14 DAY READER) DEVI 1 Device by Does not apply route every morning. 11/08/19   Bonnita Hollow, MD  cycloSPORINE (RESTASIS) 0.05 % ophthalmic emulsion Place 1 drop into both eyes 2 (two) times daily.    [provider]  Dulaglutide (TRULICITY) 4.12 IN/8.6VE SOPN Inject into the skin.    [provider]  gabapentin (NEURONTIN) 400 MG capsule TAKE 1 CAPSULE BY MOUTH TWICE DAILY 02/20/21   [name], MD  gabapentin (NEURONTIN) 400 MG capsule Take 400 mg by mouth at bedtime as needed (uncontrolled neuropathy).    [provider]  Incontinence Supply Disposable (DEPEND ADJUSTABLE UNDERWEAR LG) MISC 1 application by Does not apply route as needed. 08/09/20   [name], MD  insulin glargine (LANTUS SOLOSTAR) 100 UNIT/ML Solostar Pen Inject 40 Units into the skin daily. Patient taking differently: Inject 20 Units into the skin daily. 12/19/20   [name], MD  Insulin Pen Needle (BD PEN NEEDLE NANO U/F) 32G X 4 MM MISC AS DIRECTED TWICE DAILY 02/24/21   [name], MD  isosorbide mononitrate (ISMO) 10 MG tablet Take 10 mg by mouth 2 (two) times  daily.    [provider]  Lancets [facility] ULTRASOFT) lancets USE AS DIRECTED 08/09/20   [name], MD  lidocaine (SALONPAS PAIN RELIEVING) 4 % Place 1 patch onto the skin daily as needed (pain).    [provider]  metoCLOPramide (REGLAN) 10 MG tablet Take 1 tablet (10 mg total) by mouth every 6 (six) hours as needed for up to 20 days for nausea. 05/04/22 05/24/22  Fransico Meadow, MD  nitroGLYCERIN (NITROSTAT) 0.4 MG SL tablet PLACE 1 TABLET UNDER THE TONGUE EVERY 5 MINS AS NEEDED FOR CHEST PAIN 04/08/21   [name], MD  [facility] VERIO test strip USE TO TEST BLOOD SUGAR 2 TO 3 TIMES A DAY 04/09/21   [name], MD  pantoprazole (PROTONIX) 20 MG tablet Take 1 tablet (20 mg total) by mouth daily for 14 days. 04/26/22 05/10/22  Wynona Dove  A, DO  sucralfate (CARAFATE) 1 g tablet Take 1 tablet (1 g total) by mouth 4 (four) times daily -  with meals and at bedtime for 7 days. 04/26/22 05/03/22  Jeanell Sparrow, DO  traMADol (ULTRAM) 50 MG tablet Take 1 tablet (50 mg total) by mouth every 8 (eight) hours as needed. 02/16/22   Benay Pike, MD    Physical Exam: BP 133/71   Pulse (!) 107   Temp 99.1 F (37.3 C) (Oral)   Resp 17   Ht _0  (1.854 m)   Wt 88.9 kg   SpO2 97%   BMI 25.86 kg/m   General: 71 y.o. year-old male frail-appearing in no acute distress.  Alert. Cardiovascular: Regular rate and rhythm with no rubs or gallops.  No thyromegaly or JVD noted.  No lower extremity edema. 2/4 pulses in all 4 extremities. Respiratory: Clear to auscultation with no wheezes or rales. Good inspiratory effort. Abdomen: Soft diffusely tender nondistended with normal bowel sounds x4 quadrants. Muskuloskeletal: No cyanosis, clubbing or edema noted bilaterally Neuro: CN II-XII intact, strength, sensation, reflexes Skin: No ulcerative lesions noted or rashes Psychiatry: Mood is appropriate for condition and setting          Labs on Admission:  Basic Metabolic Panel: Recent  Labs  Lab 05/11/22 1922  NA 137  K 4.3  CL 99  CO2 23  GLUCOSE 168*  BUN 35*  CREATININE 2.66*  CALCIUM 9.4   Liver Function Tests: Recent Labs  Lab 05/11/22 1922  AST 54*  ALT 68*  ALKPHOS 89  BILITOT 1.3*  PROT 8.4*  ALBUMIN 3.9   Recent Labs  Lab 05/11/22 1922  LIPASE 45   No results for input(s): "AMMONIA" in the last 168 hours. CBC: Recent Labs  Lab 05/11/22 1922  WBC 6.6  NEUTROABS 5.0  HGB 11.9*  HCT 37.3*  MCV 86.7  PLT 298   Cardiac Enzymes: No results for input(s): "CKTOTAL", "CKMB", "CKMBINDEX", "TROPONINI" in the last 168 hours.  BNP (last 3 results) Recent Labs    04/26/22 1900  BNP 42.9    ProBNP (last 3 results) No results for input(s): "PROBNP" in the last 8760 hours.  CBG: Recent Labs  Lab 05/08/22 1046  GLUCAP 123*    Radiological Exams on Admission: CT ABDOMEN PELVIS WO CONTRAST  Result Date: 05/11/2022 CLINICAL DATA:  Abdominal pain with nausea and vomiting. EXAM: CT ABDOMEN AND PELVIS WITHOUT CONTRAST TECHNIQUE: Multidetector CT imaging of the abdomen and pelvis was performed following the standard protocol without IV contrast. RADIATION DOSE REDUCTION: This exam was performed according to the departmental dose-optimization program which includes automated exposure control, adjustment of the mA and/or kV according to patient size and/or use of iterative reconstruction technique. COMPARISON:  May 04, 2022 FINDINGS: Lower chest: Mild atelectatic changes are seen within the right middle lobe and bilateral lung bases. Hepatobiliary: No focal liver abnormality is seen. No gallstones, gallbladder wall thickening, or biliary dilatation. Pancreas: Numerous tiny parenchymal calcifications are seen scattered throughout the pancreas. There is no evidence of pancreatic ductal dilatation or surrounding inflammatory fat stranding. Spleen: Normal in size without focal abnormality. Adrenals/Urinary Tract: Adrenal glands are unremarkable.  Kidneys are normal in size, without obstructing renal calculi or hydronephrosis. 2 mm nonobstructing renal calculi are seen within both kidneys. Mild focal cortical scarring is seen along the posterolateral aspect of the mid to lower left kidney. Bladder is unremarkable. Stomach/Bowel: There is a small, stable hiatal hernia. Appendix appears normal. Stool is seen  throughout the large bowel. No evidence of bowel dilatation. Mild, asymmetric anterior and right lateral rectal wall thickening is seen (axial CT images 86 through 88, CT series 2). Vascular/Lymphatic: Aortic atherosclerosis. Multiple stable enlarged right pelvic and right inguinal lymph nodes are seen (the largest measuring approximately 3.3 cm x 2.8 cm), with a stable 7.3 cm x 7.5 cm necrotic right inguinal mass. Reproductive: The prostate gland is mildly enlarged and unchanged in size. Other: A stable 16 mm x 11 mm fat containing umbilical hernia is seen. No abdominopelvic ascites. Musculoskeletal: No acute or significant osseous findings. IMPRESSION: 1. Mild, asymmetric anterior and right lateral rectal wall thickening, as described above. While this may be, in part, secondary to underdistention, sequelae associated with an underlying neoplastic process cannot be excluded. Correlation with colonoscopy is recommended. 2. Stable right pelvic and right inguinal lymphadenopathy with a stable 7.3 cm x 7.5 cm necrotic right inguinal mass. 3. Small, stable hiatal hernia. 4. Bilateral 2 mm nonobstructing renal calculi. 5. Aortic atherosclerosis. Aortic Atherosclerosis (ICD10-I70.0). Electronically Signed   By: Virgina Norfolk M.D.   On: 05/11/2022 21:16    EKG: I independently viewed the EKG done and my findings are as followed: Sinus tachycardia rate of 120, nonspecific ST-T changes.  QTc 481.  Assessment/Plan Present on Admission:  AKI (acute kidney injury) ([place])  Principal Problem:   AKI (acute kidney injury) ([place])  AKI on CKD 3B, likely  prerenal in the setting of dehydration from nausea and vomiting Baseline creatinine 1.6 with GFR 46 Presented with creatinine of 2.66 with GFR 25. Received IV fluid in the ED Nausea is improved, will hold off on IV fluid for now due to heart failure. Monitor urine output with strict I's and O's Repeat chemistry panel in the morning Avoid nephrotoxic agents, and hypotension.  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  Mild, symmetric anterior and right lateral rectal wall thickening, associated underlying neoplastic process cannot be excluded.  Correlation with colonoscopy is recommended.  Outpatient follow-up   Elevated liver chemistries, possible side effect from Keytruda AST, ALT and T. bili elevated. Avoid hepatotoxic agents Monitor and repeat chemistry panel in the morning  Chronic systolic CHF Hypovolemic on exam Last 2D echo done on 04/09/2022 showed LVEF 30 to 35% Strict I's and O's and daily weight  Type 2 diabetes with hyperglycemia Obtain hemoglobin A1c Start insulin sliding scale.  Generalized weakness PT OT assessment Fall precautions UA is pending at the time of this dictation.    DVT prophylaxis: Subcu Lovenox daily  Code Status: Full code  Family Communication: None at bedside  Disposition Plan: Admitted to telemetry unit  Consults called: None  Admission status: Inpatient status   Status is: Inpatient The patient requires at least 2 midnights for further evaluation and treatment of present condition.   Kayleen Memos MD [facility] Pager [phone]  If 7PM-7AM, please contact night-coverage [email or web address] Password [email or web address]  05/12/2022, 4:25 AM

## 2022-05-12 NOTE — Progress Notes (Signed)
Seen and examined, admitted by Dr. Nevada Crane this morning  Briefly 71 year old male with Merkel cell carcinoma, currently being treated with Keytruda (followed by Dr. Chryl Heck), CAD, HTN, HLP, diabetes mellitus, prior CVA presented to ED with diffuse abdominal pain for 1 week, associated with nausea and vomiting.  No fevers. In ED, CT abdomen and pelvis showed right pelvic and right inguinal lymphadenopathy with a stable 7.3x 7.5 cm necrotic right inguinal mass.  Mild, symmetric anterior and right lateral rectal wall thickening associated with an underlying neoplastic process cannot be excluded.  Recommended colonoscopy. Creatinine 2.66, baseline 1.6.  AST 54, ALT 68, total bilirubin 1.3.  Hemoglobin 11.9  BP 138/85 (BP Location: Right Arm)   Pulse (!) 102   Temp 98.6 F (37 C) (Oral)   Resp 14   Ht '6\' 1"'$  (1.854 m)   Wt 88.9 kg   SpO2 100%   BMI 25.86 kg/m   Physical Exam General: Alert and oriented x 3, NAD Cardiovascular: S1 S2 clear, RRR.  Respiratory: CTAB, no wheezing Gastrointestinal: Soft, nontender, nondistended, NBS Ext: no pedal edema bilaterally Neuro: no new deficits  Labs, imaging reviewed    AKI superimposed on CKD 3B,  - likely prerenal in the setting of dehydration from nausea and vomiting - per daughter, poor p.o. 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 Chryl Heck will evaluate -GI  consulted, ?  Needs colonoscopy  Discussed in detail with patient's daughter on the phone and updated  Will follow    Sonnia Strong M.D.  [facility] 05/12/2022, 12:55 PM

## 2022-05-12 NOTE — ED Notes (Signed)
Pt resting in bed with eyes closed. No distress noted. RR even and unlabored. Bed in low position. Call bell in reach

## 2022-05-12 NOTE — Evaluation (Signed)
Occupational Therapy Evaluation Patient Details Name: Antonio Guerra MRN: [id number] DOB: Jun 07, 1951 Today's Date: 05/12/2022   History of Present Illness Patient is 71 y.o. male with a history of merkel cell carcinoma currently being treated with [place] presents with abdominal pain and vomiting. He is also having a lot of pain in his right groin where he has the very enlarged lymph node.   Clinical Impression   Patient is currently requiring assistance with ADLs including up to total assist with Lower body ADLs, up to moderate assist with Upper body ADLs,  as well as  maximum assist with bed mobility and up to moderate assist with functional transfers to toilet.  Current level of function is below patient's typical baseline.  During this evaluation, patient was limited by generalized weakness, impaired activity tolerance, and RLE pain as well as plantar pain to Bilateral feet from neuropathy, all of which has the potential to impact patient's safety and independence during functional mobility, as well as performance for ADLs.  Patient lives in a 2nd floor apartment with his daughter, who is unable to provide 24/7 supervision and assistance due to work. Pt does have a home Aide who comes M-F in the AM for 3 hours per day and assists with bed mobility, tranfers and ADLs.  Patient demonstrates fair rehab potential, and should benefit from continued skilled occupational therapy services while in acute care to maximize safety, independence and quality of life at home.  Continued occupational therapy services in a SNF setting prior to return home is recommended.  ?      Recommendations for follow up therapy are one component of a multi-disciplinary discharge planning process, led by the attending physician.  Recommendations may be updated based on patient status, additional functional criteria and insurance authorization.   Follow Up Recommendations  Skilled nursing-short term rehab (<3 hours/day)     Assistance Recommended at Discharge Frequent or constant Supervision/Assistance  Patient can return home with the following Two people to help with walking and/or transfers;A lot of help with bathing/dressing/bathroom    Functional Status Assessment  Patient has had a recent decline in their functional status and demonstrates the ability to make significant improvements in function in a reasonable and predictable amount of time.  Equipment Recommendations   (Will defer to post-acute recommendations.)    Recommendations for Other Services       Precautions / Restrictions Precautions Precautions: Fall Precaution Comments: 2 falls in last 6 months Restrictions Weight Bearing Restrictions: No      Mobility Bed Mobility Overal bed mobility: Needs Assistance Bed Mobility: Supine to Sit, Sit to Supine     Supine to sit: Mod assist, Max assist, +2 for safety/equipment, HOB elevated Sit to supine: Max assist, +2 for safety/equipment, HOB elevated        Transfers                          Balance Overall balance assessment: Needs assistance Sitting-balance support: Feet supported Sitting balance-Leahy Scale: Fair     Standing balance support: During functional activity, Reliant on assistive device for balance, Bilateral upper extremity supported Standing balance-Leahy Scale: Poor                             ADL either performed or assessed with clinical judgement   ADL Overall ADL's : Needs assistance/impaired Eating/Feeding: Minimal assistance;Bed level   Grooming: Moderate assistance;Bed level;Wash/dry face  Upper Body Bathing: Moderate assistance;Sitting   Lower Body Bathing: Maximal assistance;Sitting/lateral leans;Bed level   Upper Body Dressing : Moderate assistance;Sitting   Lower Body Dressing: Total assistance;Bed level   Toilet Transfer: Rolling walker (2 wheels);Cueing for sequencing;Cueing for safety;+2 for  safety/equipment Toilet Transfer Details (indicate cue type and reason): Pt stood from elevated stretcher to RW with cues and Mod Assist. Pt took ~3 lateral steps with RW and Min to Moderate assist with 2nd person for safety. Toileting- Clothing Manipulation and Hygiene: Maximal assistance;Sitting/lateral lean;Sit to/from stand       Functional mobility during ADLs: Moderate assistance;Minimal assistance;Rolling walker (2 wheels);+2 for safety/equipment       Vision   Additional Comments: Pt mentioned recent cornea procedure. Eye appear irritated with pt rubbing them at times.     Perception     Praxis      Pertinent Vitals/Pain Pain Assessment Pain Assessment: Faces Faces Pain Scale: Hurts even more Pain Location: Rt hip/leg, plantar feet with touch Pain Intervention(s): Limited activity within patient's tolerance, Monitored during session, Repositioned     Hand Dominance Left   Extremity/Trunk Assessment Upper Extremity Assessment Upper Extremity Assessment: Generalized weakness   Lower Extremity Assessment Lower Extremity Assessment: Generalized weakness   Cervical / Trunk Assessment Cervical / Trunk Assessment: Kyphotic   Communication Communication Communication: Expressive difficulties (Increased effort)   Cognition Arousal/Alertness: Awake/alert Behavior During Therapy: Flat affect Overall Cognitive Status: No family/caregiver present to determine baseline cognitive functioning                                       General Comments       Exercises     Shoulder Instructions      Home Living Family/patient expects to be discharged to:: Private residence Living Arrangements: Children Available Help at Discharge: Family;Available PRN/intermittently;Personal care attendant Type of Home: Apartment Home Access: Stairs to enter Entrance Stairs-Number of Steps: 2 flights (10) Entrance Stairs-Rails: Right;Left Home Layout: One level      Bathroom Shower/Tub: Teacher, early years/pre: Standard Bathroom Accessibility: No   Home Equipment: Conservation officer, nature (2 wheels);Grab bars - tub/shower   Additional Comments: aide in the morning for 3 hours M-F and daughter helps on weekend with all mobility and ADL's. Pt reports he uses RW to move around home when no-one is there to help.      Prior Functioning/Environment Prior Level of Function : Needs assist;History of Falls (last six months)       Physical Assist : Mobility (physical);ADLs (physical) Mobility (physical): Transfers;Bed mobility;Gait ADLs (physical): IADLs;Toileting;Dressing;Bathing Mobility Comments: uses RW for standing, ambs with walker in home for short distances ADLs Comments: home aide helps him shower and get out of bed in the morning, does birth/sink bath or sponge bath, helps him dress, and assists with hygiene. Daughter will assist when home from work.        OT Problem List: Decreased strength;Decreased coordination;Pain;Decreased cognition;Decreased activity tolerance;Decreased safety awareness;Impaired balance (sitting and/or standing);Decreased knowledge of use of DME or AE;Impaired vision/perception      OT Treatment/Interventions: Self-care/ADL training;Therapeutic exercise;Therapeutic activities;Cognitive remediation/compensation;Energy conservation;Visual/perceptual remediation/compensation;Patient/family education;DME and/or AE instruction;Balance training    OT Goals(Current goals can be found in the care plan section) Acute Rehab OT Goals OT Goal Formulation: Patient unable to participate in goal setting Potential to Achieve Goals: Fair ADL Goals Pt Will Perform Grooming: sitting;with set-up (and good balance) Pt  Will Perform Lower Body Dressing: with min assist;sit to/from stand;sitting/lateral leans;with adaptive equipment;with set-up Pt Will Transfer to Toilet: stand pivot transfer;with supervision Pt Will Perform Toileting -  Clothing Manipulation and hygiene: with adaptive equipment;with min guard assist;sitting/lateral leans;sit to/from stand Pt/caregiver will Perform Home Exercise Program: Increased strength;Both right and left upper extremity;With Supervision Additional ADL Goal #1: Patient will identify at least 3 fall prevention strategies to employ at home in order to maximize function and safety during ADLs and decrease caregiver burden while preventing possible injury and rehospitalization.  OT Frequency: Min 2X/week    Co-evaluation PT/OT/SLP Co-Evaluation/Treatment: Yes Reason for Co-Treatment: For patient/therapist safety;To address functional/ADL transfers PT goals addressed during session: Mobility/safety with mobility;Balance;Proper use of DME OT goals addressed during session: Strengthening/ROM;ADL's and self-care      AM-PAC OT "6 Clicks" Daily Activity     Outcome Measure Help from another person eating meals?: A Little Help from another person taking care of personal grooming?: A Lot Help from another person toileting, which includes using toliet, bedpan, or urinal?: A Lot Help from another person bathing (including washing, rinsing, drying)?: A Lot Help from another person to put on and taking off regular upper body clothing?: A Lot Help from another person to put on and taking off regular lower body clothing?: Total 6 Click Score: 12   End of Session Equipment Utilized During Treatment: Gait belt;Rolling walker (2 wheels) Nurse Communication: Mobility status  Activity Tolerance: Patient tolerated treatment well Patient left: in bed;with call bell/phone within reach  OT Visit Diagnosis: Unsteadiness on feet (R26.81);Pain;Muscle weakness (generalized) (M62.81);History of falling (Z91.81);Repeated falls (R29.6) Pain - Right/Left: Right Pain - part of body: Leg                Time: 6222-9798 OT Time Calculation (min): 22 min Charges:  OT General Charges $OT Visit: 1 Visit OT  Evaluation $OT Eval Low Complexity: 1 Low  Sarah Baez, OT Acute Rehab Services Office: [phone] 05/12/2022  Julien Girt 05/12/2022, 11:37 AM

## 2022-05-12 NOTE — Consult Note (Signed)
WOC Nurse Consult Note: Reason for Consult:Stage 1 pressure injury to left buttock, POA Wound type:pressure Pressure Injury POA: Yes Measurement:Bedside RN to measure and document on Nursing Flow Sheet with next dressing application  Wound KMQ:KMMNOT, red, non blanching Drainage (amount, consistency, odor) None Periwound:intact Dressing procedure/placement/frequency: I will activate the standing skin care order set for Stage 1 pressure injuries. Turning and repositioning is in place, I have provided guidance for minimizing time in the supine position. Heels are to be floated and a silicone foam placed over the affected area.It is to be lifted for skin assessment each shift and changed every 3 days and PRN soiling.   [facility] nursing team will not follow, but will remain available to this patient, the nursing and medical teams.  Please re-consult if needed.  Thank you for inviting [place] to participate in this patient's Plan of Care.  Maudie Flakes, MSN, RN, CNS, [place], Serita Grammes, [employer], [employer] phone:  [phone]

## 2022-05-12 NOTE — Evaluation (Signed)
Physical Therapy Evaluation Patient Details Name: Antonio Guerra MRN: [id number] DOB: Jan 23, 1951 Today's Date: 05/12/2022  History of Present Illness  Patient is 71 y.o. male with a history of merkel cell carcinoma currently being treated with [place] presents with abdominal pain and vomiting. He is also having a lot of pain in his right groin where he has the very enlarged lymph node.   Clinical Impression  Antonio Guerra is 71 y.o. male admitted with above HPI and diagnosis. Patient is currently limited by functional impairments below (see PT problem list). Patient lives with his daughter and has a home aid in the mornings M-F for assist with ADL's. He uses RW in home for short bouts of gait to get to bathroom during the day while his daughter is at work. Pt required Mod-Max +2 assist for safety with bed mobility and transfers today with RW. He was able to take small side steps along EOB with Mod assist and was limited by Rt LE pain. Patient will benefit from continued skilled PT interventions to address impairments and progress independence with mobility, recommending ST rehab at SNF to improved independence/function with mobility prior to return home as he is alone during the day. Acute PT will follow and progress as able.        Recommendations for follow up therapy are one component of a multi-disciplinary discharge planning process, led by the attending physician.  Recommendations may be updated based on patient status, additional functional criteria and insurance authorization.  Follow Up Recommendations Skilled nursing-short term rehab (<3 hours/day) Can patient physically be transported by private vehicle: No    Assistance Recommended at Discharge Frequent or constant Supervision/Assistance  Patient can return home with the following  A lot of help with walking and/or transfers;A lot of help with bathing/dressing/bathroom;Assistance with cooking/housework;Direct supervision/assist for  medications management;Assist for transportation;Help with stairs or ramp for entrance    Equipment Recommendations  (TBA)  Recommendations for Other Services       Functional Status Assessment Patient has had a recent decline in their functional status and demonstrates the ability to make significant improvements in function in a reasonable and predictable amount of time.     Precautions / Restrictions Precautions Precautions: Fall Precaution Comments: 2 falls in last 6 months Restrictions Weight Bearing Restrictions: No      Mobility  Bed Mobility Overal bed mobility: Needs Assistance Bed Mobility: Supine to Sit, Sit to Supine     Supine to sit: Mod assist, Max assist, +2 for safety/equipment, HOB elevated Sit to supine: Max assist, +2 for safety/equipment, HOB elevated        Transfers Overall transfer level: Needs assistance Equipment used: Rolling walker (2 wheels) Transfers: Sit to/from Stand Sit to Stand: Mod assist, +2 safety/equipment, From elevated surface                Ambulation/Gait Ambulation/Gait assistance: Min assist, Mod assist, +2 safety/equipment Gait Distance (Feet): 3 Feet Assistive device: Rolling walker (2 wheels) Gait Pattern/deviations: Step-to pattern, Decreased stride length Gait velocity: decr   Pre-gait activities: lateral stepping at [employer]    Stairs            Wheelchair Mobility    Modified Rankin (Stroke Patients Only)       Balance Overall balance assessment: Needs assistance Sitting-balance support: Feet supported Sitting balance-Leahy Scale: Fair     Standing balance support: During functional activity, Reliant on assistive device for balance, Bilateral upper extremity supported Standing balance-Leahy Scale: Poor  Pertinent Vitals/Pain Pain Assessment Pain Assessment: Faces Faces Pain Scale: Hurts even more Pain Location: Rt hip/leg Pain Intervention(s):  Limited activity within patient's tolerance, Monitored during session, Repositioned    Home Living Family/patient expects to be discharged to:: Private residence Living Arrangements: Children Available Help at Discharge: Family;Available PRN/intermittently;Personal care attendant (dtr works) Type of Home: Apartment Home Access: Stairs to enter Entrance Stairs-Rails: Psychiatric nurse of Steps: 2 flights (10)   Home Layout: One level Home Equipment: Conservation officer, nature (2 wheels) Additional Comments: aid in the morning for 3 hours M-F and daughter helps on weekend with all mobility and ADL's. Pt reports he uses RW to move around home when no-one is there to help.    Prior Function Prior Level of Function : Needs assist             Mobility Comments: uses RW for standing, ambs with walker in home for short distances ADLs Comments: home aid helps him shower and getting up in the morning  OOB, does birth/sink bath or sponge bath, helps him dress,     Hand Dominance   Dominant Hand: Left    Extremity/Trunk Assessment   Upper Extremity Assessment Upper Extremity Assessment: Defer to OT evaluation    Lower Extremity Assessment Lower Extremity Assessment: Generalized weakness    Cervical / Trunk Assessment Cervical / Trunk Assessment: Kyphotic  Communication   Communication: No difficulties (slightly gargled/mumbled)  Cognition Arousal/Alertness: Awake/alert Behavior During Therapy: Flat affect Overall Cognitive Status: No family/caregiver present to determine baseline cognitive functioning                                          General Comments      Exercises     Assessment/Plan    PT Assessment Patient needs continued PT services  PT Problem List Decreased strength;Decreased activity tolerance;Decreased balance;Decreased mobility;Decreased cognition;Decreased knowledge of use of DME;Decreased safety awareness;Decreased knowledge of  precautions;Pain       PT Treatment Interventions DME instruction;Gait training;Stair training;Functional mobility training;Therapeutic activities;Therapeutic exercise;Balance training;Patient/family education    PT Goals (Current goals can be found in the Care Plan section)  Acute Rehab PT Goals Patient Stated Goal: stop hurting in Rt leg PT Goal Formulation: With patient Time For Goal Achievement: 05/26/22 Potential to Achieve Goals: Fair    Frequency Min 2X/week     Co-evaluation PT/OT/SLP Co-Evaluation/Treatment: Yes Reason for Co-Treatment: For patient/therapist safety;To address functional/ADL transfers PT goals addressed during session: Balance;Mobility/safety with mobility;Proper use of DME OT goals addressed during session: Strengthening/ROM;ADL's and self-care       AM-PAC PT "6 Clicks" Mobility  Outcome Measure Help needed turning from your back to your side while in a flat bed without using bedrails?: A Lot Help needed moving from lying on your back to sitting on the side of a flat bed without using bedrails?: A Lot Help needed moving to and from a bed to a chair (including a wheelchair)?: A Lot Help needed standing up from a chair using your arms (e.g., wheelchair or bedside chair)?: A Lot Help needed to walk in hospital room?: A Lot Help needed climbing 3-5 steps with a railing? : Total 6 Click Score: 11    End of Session Equipment Utilized During Treatment: Gait belt Activity Tolerance: Patient tolerated treatment well;Patient limited by pain Patient left: in bed;with call bell/phone within reach Nurse Communication: Mobility status PT Visit Diagnosis:  Other abnormalities of gait and mobility (R26.89);Muscle weakness (generalized) (M62.81);Difficulty in walking, not elsewhere classified (R26.2);Pain Pain - Right/Left: Right Pain - part of body: Leg    Time: 3403-7096 PT Time Calculation (min) (ACUTE ONLY): 24 min   Charges:   PT Evaluation $PT Eval  Moderate Complexity: 1 Mod          Verner Mould, DPT Acute Rehabilitation Services Office [phone]  05/12/22 11:18 AM

## 2022-05-13 ENCOUNTER — Inpatient Hospital Stay (HOSPITAL_COMMUNITY): Payer: Medicare (Managed Care) | Admitting: Anesthesiology

## 2022-05-13 ENCOUNTER — Other Ambulatory Visit: Payer: Self-pay | Admitting: *Deleted

## 2022-05-13 ENCOUNTER — Encounter (HOSPITAL_COMMUNITY)
Admission: EM | Disposition: A | Discharge status: Home / Self Care | Payer: Self-pay | Source: Home / Self Care | Attending: Internal Medicine

## 2022-05-13 ENCOUNTER — Encounter (HOSPITAL_COMMUNITY): Payer: Self-pay | Admitting: Internal Medicine

## 2022-05-13 ENCOUNTER — Ambulatory Visit: Payer: Medicare (Managed Care)

## 2022-05-13 DIAGNOSIS — C4A9 Merkel cell carcinoma, unspecified: Secondary | ICD-10-CM

## 2022-05-13 DIAGNOSIS — N189 Chronic kidney disease, unspecified: Principal | ICD-10-CM

## 2022-05-13 DIAGNOSIS — N1831 Chronic kidney disease, stage 3a: Secondary | ICD-10-CM

## 2022-05-13 DIAGNOSIS — F419 Anxiety disorder, unspecified: Secondary | ICD-10-CM

## 2022-05-13 DIAGNOSIS — R933 Abnormal findings on diagnostic imaging of other parts of digestive tract: Secondary | ICD-10-CM

## 2022-05-13 DIAGNOSIS — K2970 Gastritis, unspecified, without bleeding: Secondary | ICD-10-CM

## 2022-05-13 DIAGNOSIS — K449 Diaphragmatic hernia without obstruction or gangrene: Secondary | ICD-10-CM

## 2022-05-13 DIAGNOSIS — E1100 Type 2 diabetes mellitus with hyperosmolarity without nonketotic hyperglycemic-hyperosmolar coma (NKHHC): Secondary | ICD-10-CM

## 2022-05-13 DIAGNOSIS — E860 Dehydration: Secondary | ICD-10-CM

## 2022-05-13 DIAGNOSIS — K209 Esophagitis, unspecified without bleeding: Secondary | ICD-10-CM

## 2022-05-13 DIAGNOSIS — Z794 Long term (current) use of insulin: Secondary | ICD-10-CM

## 2022-05-13 DIAGNOSIS — I5022 Chronic systolic (congestive) heart failure: Secondary | ICD-10-CM | POA: Diagnosis not present

## 2022-05-13 DIAGNOSIS — R112 Nausea with vomiting, unspecified: Secondary | ICD-10-CM

## 2022-05-13 DIAGNOSIS — K2081 Other esophagitis with bleeding: Secondary | ICD-10-CM

## 2022-05-13 DIAGNOSIS — K3189 Other diseases of stomach and duodenum: Secondary | ICD-10-CM

## 2022-05-13 DIAGNOSIS — N179 Acute kidney failure, unspecified: Secondary | ICD-10-CM | POA: Diagnosis not present

## 2022-05-13 HISTORY — PX: ESOPHAGOGASTRODUODENOSCOPY (EGD) WITH PROPOFOL: SHX5813

## 2022-05-13 HISTORY — PX: FLEXIBLE SIGMOIDOSCOPY: SHX5431

## 2022-05-13 HISTORY — PX: BIOPSY: SHX5522

## 2022-05-13 LAB — COMPREHENSIVE METABOLIC PANEL
ALT: 35 U/L (ref 0–44)
AST: 26 U/L (ref 15–41)
Albumin: 2.9 g/dL — ABNORMAL LOW (ref 3.5–5.0)
Alkaline Phosphatase: 63 U/L (ref 38–126)
Anion gap: 9 (ref 5–15)
BUN: 23 mg/dL (ref 8–23)
CO2: 26 mmol/L (ref 22–32)
Calcium: 8.6 mg/dL — ABNORMAL LOW (ref 8.9–10.3)
Chloride: 104 mmol/L (ref 98–111)
Creatinine, Ser: 1.31 mg/dL — ABNORMAL HIGH (ref 0.61–1.24)
GFR, Estimated: 59 mL/min — ABNORMAL LOW (ref >60)
Glucose, Bld: 101 mg/dL — ABNORMAL HIGH (ref 70–99)
Potassium: 3.9 mmol/L (ref 3.5–5.1)
Sodium: 139 mmol/L (ref 135–145)
Total Bilirubin: 1 mg/dL (ref 0.3–1.2)
Total Protein: 6.3 g/dL — ABNORMAL LOW (ref 6.5–8.1)

## 2022-05-13 LAB — CBC
HCT: 29.8 % — ABNORMAL LOW (ref 39.0–52.0)
Hemoglobin: 9.7 g/dL — ABNORMAL LOW (ref 13.0–17.0)
MCH: 27.6 pg (ref 26.0–34.0)
MCHC: 32.6 g/dL (ref 30.0–36.0)
MCV: 84.9 fL (ref 80.0–100.0)
Platelets: 248 10*3/uL (ref 150–400)
RBC: 3.51 MIL/uL — ABNORMAL LOW (ref 4.22–5.81)
RDW: 15.5 % (ref 11.5–15.5)
WBC: 4.5 10*3/uL (ref 4.0–10.5)
nRBC: 0 % (ref 0.0–0.2)

## 2022-05-13 LAB — GLUCOSE, CAPILLARY
Glucose-Capillary: 97 mg/dL (ref 70–99)
Glucose-Capillary: 106 mg/dL — ABNORMAL HIGH (ref 70–99)
Glucose-Capillary: 85 mg/dL (ref 70–99)
Glucose-Capillary: 125 mg/dL — ABNORMAL HIGH (ref 70–99)

## 2022-05-13 LAB — HIV ANTIBODY (ROUTINE TESTING W REFLEX): HIV Screen 4th Generation wRfx: NONREACTIVE

## 2022-05-13 SURGERY — ESOPHAGOGASTRODUODENOSCOPY (EGD) WITH PROPOFOL
Anesthesia: Monitor Anesthesia Care

## 2022-05-13 MED ORDER — ONDANSETRON HCL 8 MG PO TABS: 8.0000 mg | ORAL_TABLET | Freq: Three times a day (TID) | ORAL | 1 refills | Status: DC | PRN

## 2022-05-13 MED ORDER — DEXAMETHASONE 4 MG PO TABS: ORAL_TABLET | ORAL | 1 refills | Status: DC

## 2022-05-13 MED ORDER — PROPOFOL 10 MG/ML IV BOLUS
INTRAVENOUS | Status: DC | PRN
  Administered 2022-05-13 (×2): 20 mg via INTRAVENOUS
  Administered 2022-05-13 (×2): 10 mg via INTRAVENOUS

## 2022-05-13 MED ORDER — PROPOFOL 500 MG/50ML IV EMUL
INTRAVENOUS | Status: AC
  Filled 2022-05-13: qty 50

## 2022-05-13 MED ORDER — PROCHLORPERAZINE MALEATE 10 MG PO TABS: 10.0000 mg | ORAL_TABLET | Freq: Four times a day (QID) | ORAL | 1 refills | Status: DC | PRN

## 2022-05-13 MED ORDER — PANTOPRAZOLE SODIUM 40 MG IV SOLR
40.0000 mg | Freq: Two times a day (BID) | INTRAVENOUS | Status: DC
  Administered 2022-05-13 – 2022-05-15 (×4): 40 mg via INTRAVENOUS
  Filled 2022-05-13 (×4): qty 10

## 2022-05-13 MED ORDER — HYDROMORPHONE HCL 1 MG/ML IJ SOLN
INTRAMUSCULAR | Status: AC
  Filled 2022-05-13: qty 1

## 2022-05-13 MED ORDER — LACTATED RINGERS IV SOLN: INTRAVENOUS | Status: AC

## 2022-05-13 MED ORDER — PHENYLEPHRINE 80 MCG/ML (10ML) SYRINGE FOR IV PUSH (FOR BLOOD PRESSURE SUPPORT)
PREFILLED_SYRINGE | INTRAVENOUS | Status: DC | PRN
  Administered 2022-05-13 (×2): 160 ug via INTRAVENOUS

## 2022-05-13 MED ORDER — PROPOFOL 500 MG/50ML IV EMUL
INTRAVENOUS | Status: DC | PRN
  Administered 2022-05-13: 100 ug/kg/min via INTRAVENOUS

## 2022-05-13 MED ORDER — SUCRALFATE 1 GM/10ML PO SUSP
1.0000 g | Freq: Four times a day (QID) | ORAL | Status: DC
  Administered 2022-05-13 – 2022-05-15 (×8): 1 g via ORAL
  Filled 2022-05-13 (×8): qty 10

## 2022-05-13 SURGICAL SUPPLY — 15 items

## 2022-05-13 NOTE — Progress Notes (Signed)
PROGRESS NOTE    Antonio Guerra  ULA:[id number] DOB: 1950-10-10 DOA: 05/11/2022 PCP: [name], MD    Chief Complaint  Patient presents with   Abdominal Pain    Brief Narrative:  Patient 71 year old malewith Merkel cell carcinoma, currently being treated with Keytruda (followed by Dr. Chryl Heck), CAD, HTN, HLP, diabetes mellitus, prior CVA presented to ED with diffuse abdominal pain for 1 week, associated with nausea and vomiting x3 weeks.  No fevers. In ED, CT abdomen and pelvis showed right pelvic and right inguinal lymphadenopathy with a stable 7.3x 7.5 cm necrotic right inguinal mass.  Mild, symmetric anterior and right lateral rectal wall thickening associated with an underlying neoplastic process cannot be excluded.  Recommended colonoscopy. Creatinine 2.66, baseline 1.6.  AST 54, ALT 68, total bilirubin 1.3.  Hemoglobin 11.9 -Patient seen in consultation by GI and patient for upper endoscopy and flex sigmoidoscopy 05/13/2022 for further evaluation.     Assessment & Plan:   Principal Problem:   AKI (acute kidney injury) ([place]) Active Problems:   Hyperlipidemia   Coronary artery disease   Chronic systolic congestive heart failure (HCC)   Type 2 diabetes mellitus (HCC)   Epigastric pain   Stage 3a chronic kidney disease (HCC)   Merkel cell carcinoma (HCC)   Acute kidney injury superimposed on chronic kidney disease (HCC)   Nausea and vomiting   Dehydration   #1 acute kidney injury superimposed on CKD stage IIIb -Likely secondary to prerenal azotemia in the setting of dehydration from nausea and vomiting, poor oral intake over the past 3 weeks per daughter. -Creatinine on admission noted at 2.6.  Baseline creatinine 1.6. -Urinalysis, nitrite negative, leukocytes negative, greater than 300 protein, specific gravity 1.032. -CT abdomen and pelvis done on admission negative for hydronephrosis or obstruction renal calculi. -Renal function trending down improving with  hydration creatinine currently at 1.31. -Patient currently n.p.o. in anticipation for upper endoscopy to be done today. -Continue gentle hydration with IV fluids. -Supportive care.  2.  Nausea/vomiting/abdominal pain in the setting of malignancy/history of gastritis/Merkel cell carcinoma currently being treated with [facility] -Patient with some clinical improvement since admission on IV antiemetics, IV fluids, IV PPI. -Patient still with significant abdominal pain. -CT abdomen and pelvis done with mild symmetric rectal wall thickening, cannot exclude neoplastic process, right inguinal mass unchanged. -Oncology, Dr. Chryl Heck consulted and and formal consultation pending. -Patient seen in consultation by GI and concerned that etiology of patient's symptoms of abdominal pain nausea and vomiting may be secondary to [place], peptic ulcer disease versus gastropathy versus progression of disease versus gastroparesis. -Patient for EGD and flex sigmoidoscopy today for further evaluation. -Patient to be given a fleets enema prior to flexible sigmoidoscopy per GI. -Continue IV PPI, sucralfate, IV antiemetics as needed. -GI following and appreciate input and recommendations.  3.  Dehydration -IV fluids.  4.  Merkel cell carcinoma currently treated with Keytruda -CT abdomen and pelvis showing stable right pelvic and right inguinal lymphadenopathy with stable 7.3 cm x 7.5 cm necrotic right inguinal mass.  Mild symmetric anterior and right lateral rectal wall thickening associated underlying neoplastic process cannot be excluded. -Patient for flexible sigmoidoscopy today. -GI following, oncology following.  5.  Transaminitis -Likely side effect from [facility]. -Follow.  6.  Chronic systolic CHF/CAD -Patient noted to be hypervolemic on examination. -Last 2D echo from 04/09/2022 with EF of 30 to 35%, strict I's and O's, daily weights. -Continue gentle hydration for another 24 hours and monitor volume status  closely. -Continue home  regimen Coreg, isosorbide.  7.  Type 2 diabetes with hyperglycemia -Hemoglobin A1c 5.3 (05/12/2022) -CBG 97 this morning. -SSI.  8.  Generalized weakness -PT/ OT.    DVT prophylaxis: Lovenox Code Status: Full Family Communication: Updated patient and daughter at bedside. Disposition: TBD  Status is: Inpatient Remains inpatient appropriate because: Severity of illness   Consultants:  Gastroenterology: Dr. Hilarie Fredrickson 05/12/2022 Wound care RN, Phineas Douglas, RN 05/12/2022  Procedures:  CT abdomen and pelvis 05/11/2022   Antimicrobials:  None   Subjective: Patient laying in bed.  States some improvement with nausea and vomiting.  Some improvement with abdominal pain.  Daughter at bedside.  Objective: Vitals:   05/12/22 2100 05/13/22 0014 05/13/22 0412 05/13/22 0500  BP: (!) 158/98 (!) 146/82 (!) 150/84   Pulse: (!) 114 (!) 110 (!) 106   Resp: '18 20 20   '$ Temp: 99.7 F (37.6 C) 98.4 F (36.9 C) 97.9 F (36.6 C)   TempSrc: Oral Oral Oral   SpO2: 99% 99% 99%   Weight:    79.5 kg  Height:        Intake/Output Summary (Last 24 hours) at 05/13/2022 1144 Last data filed at 05/13/2022 0619 Gross per 24 hour  Intake 1194.57 ml  Output 250 ml  Net 944.57 ml   Filed Weights   05/11/22 1858 05/13/22 0500  Weight: 88.9 kg 79.5 kg    Examination:  General exam: Appears calm and comfortable  Respiratory system: Clear to auscultation. Respiratory effort normal. Cardiovascular system: Regular rate rhythm no murmurs rubs or gallops.  No JVD.  No lower extremity edema.  Gastrointestinal system: Abdomen is nondistended, soft and exquisitely tender to palpation in the epigastric region and right upper quadrant region.  Some diffuse abdominal pain.  Positive bowel sounds.  No rebound.  No guarding. Central nervous system: Alert and oriented. No focal neurological deficits. Extremities: Symmetric 5 x 5 power. Skin: No rashes, lesions or  ulcers Psychiatry: Judgement and insight appear normal. Mood & affect appropriate.     Data Reviewed: I have personally reviewed following labs and imaging studies  CBC: Recent Labs  Lab 05/11/22 1922 05/12/22 0459 05/13/22 0345  WBC 6.6 7.6 4.5  NEUTROABS 5.0 5.3  --   HGB 11.9* 9.9* 9.7*  HCT 37.3* 30.4* 29.8*  MCV 86.7 85.6 84.9  PLT 298 245 132    Basic Metabolic Panel: Recent Labs  Lab 05/11/22 1922 05/12/22 0459 05/13/22 0345  NA 137 140 139  K 4.3 3.8 3.9  CL 99 103 104  CO2 '23 28 26  '$ GLUCOSE 168* 128* 101*  BUN 35* 30* 23  CREATININE 2.66* 2.06* 1.31*  CALCIUM 9.4 8.7* 8.6*  MG  --  2.0  --   PHOS  --  4.6  --     GFR: Estimated Creatinine Clearance: 59 mL/min (A) (by C-G formula based on SCr of 1.31 mg/dL (H)).  Liver Function Tests: Recent Labs  Lab 05/11/22 1922 05/12/22 0459 05/13/22 0345  AST 54* 34 26  ALT 68* 50* 35  ALKPHOS 89 71 63  BILITOT 1.3* 1.0 1.0  PROT 8.4* 6.9 6.3*  ALBUMIN 3.9 3.2* 2.9*    CBG: Recent Labs  Lab 05/12/22 0807 05/12/22 1120 05/12/22 1820 05/12/22 2140 05/13/22 0746  GLUCAP 120* 166* 149* 110* 97     No results found for this or any previous visit (from the past 240 hour(s)).       Radiology Studies: CT ABDOMEN PELVIS WO CONTRAST  Result Date: 05/11/2022  CLINICAL DATA:  Abdominal pain with nausea and vomiting. EXAM: CT ABDOMEN AND PELVIS WITHOUT CONTRAST TECHNIQUE: Multidetector CT imaging of the abdomen and pelvis was performed following the standard protocol without IV contrast. RADIATION DOSE REDUCTION: This exam was performed according to the departmental dose-optimization program which includes automated exposure control, adjustment of the mA and/or kV according to patient size and/or use of iterative reconstruction technique. COMPARISON:  May 04, 2022 FINDINGS: Lower chest: Mild atelectatic changes are seen within the right middle lobe and bilateral lung bases. Hepatobiliary: No focal liver  abnormality is seen. No gallstones, gallbladder wall thickening, or biliary dilatation. Pancreas: Numerous tiny parenchymal calcifications are seen scattered throughout the pancreas. There is no evidence of pancreatic ductal dilatation or surrounding inflammatory fat stranding. Spleen: Normal in size without focal abnormality. Adrenals/Urinary Tract: Adrenal glands are unremarkable. Kidneys are normal in size, without obstructing renal calculi or hydronephrosis. 2 mm nonobstructing renal calculi are seen within both kidneys. Mild focal cortical scarring is seen along the posterolateral aspect of the mid to lower left kidney. Bladder is unremarkable. Stomach/Bowel: There is a small, stable hiatal hernia. Appendix appears normal. Stool is seen throughout the large bowel. No evidence of bowel dilatation. Mild, asymmetric anterior and right lateral rectal wall thickening is seen (axial CT images 86 through 88, CT series 2). Vascular/Lymphatic: Aortic atherosclerosis. Multiple stable enlarged right pelvic and right inguinal lymph nodes are seen (the largest measuring approximately 3.3 cm x 2.8 cm), with a stable 7.3 cm x 7.5 cm necrotic right inguinal mass. Reproductive: The prostate gland is mildly enlarged and unchanged in size. Other: A stable 16 mm x 11 mm fat containing umbilical hernia is seen. No abdominopelvic ascites. Musculoskeletal: No acute or significant osseous findings. IMPRESSION: 1. Mild, asymmetric anterior and right lateral rectal wall thickening, as described above. While this may be, in part, secondary to underdistention, sequelae associated with an underlying neoplastic process cannot be excluded. Correlation with colonoscopy is recommended. 2. Stable right pelvic and right inguinal lymphadenopathy with a stable 7.3 cm x 7.5 cm necrotic right inguinal mass. 3. Small, stable hiatal hernia. 4. Bilateral 2 mm nonobstructing renal calculi. 5. Aortic atherosclerosis. Aortic Atherosclerosis (ICD10-I70.0).  Electronically Signed   By: Virgina Norfolk M.D.   On: 05/11/2022 21:16        Scheduled Meds:  carvedilol  12.5 mg Oral BID WC   cycloSPORINE  1 drop Both Eyes BID   enoxaparin (LOVENOX) injection  40 mg Subcutaneous Q24H   gabapentin  400 mg Oral BID   insulin aspart  0-5 Units Subcutaneous QHS   insulin aspart  0-9 Units Subcutaneous TID WC   isosorbide mononitrate  10 mg Oral Daily   pantoprazole (PROTONIX) IV  40 mg Intravenous Q24H   sucralfate  1 g Oral TID WC & HS   Continuous Infusions:  lactated ringers 75 mL/hr at 05/13/22 0943     LOS: 2 days    Time spent: 40 mins    Irine Seal, MD [facility]   To contact the attending provider between 7A-7P or the covering provider during after hours 7P-7A, please log into the web site [email or web address] and access using universal [place] password for that web site. If you do not have the password, please call the hospital operator.  05/13/2022, 11:44 AM

## 2022-05-13 NOTE — Op Note (Signed)
[facility] Patient Name: Antonio Guerra Procedure Date: 05/13/2022 MRN: [id number] Attending MD: Jerene Bears , MD, [id number] Date of Birth: 11/10/1950 CSN: [id number] Age: 71 Admit Type: Inpatient Procedure:                Upper GI endoscopy Indications:              Generalized abdominal pain, Nausea with vomiting Providers:                Lajuan Lines. Hilarie Fredrickson, MD, Velva Harman, RN, Benetta Spar, Technician Referring MD:             [facility] Medicines:                Monitored Anesthesia Care Complications:            No immediate complications. Estimated Blood Loss:     Estimated blood loss was minimal. Procedure:                Pre-Anesthesia Assessment:                           - Prior to the procedure, a History and Physical                            was performed, and patient medications and                            allergies were reviewed. The patient's tolerance of                            previous anesthesia was also reviewed. The risks                            and benefits of the procedure and the sedation                            options and risks were discussed with the patient.                            All questions were answered, and informed consent                            was obtained. Prior Anticoagulants: The patient has                            taken no anticoagulant or antiplatelet agents. ASA                            Grade Assessment: III - A patient with severe                            systemic disease. After reviewing the risks and  benefits, the patient was deemed in satisfactory                            condition to undergo the procedure.                           After obtaining informed consent, the endoscope was                            passed under direct vision. Throughout the                            procedure, the patient's blood pressure,  pulse, and                            oxygen saturations were monitored continuously. The                            [id number]-[id number] ([id number]) Olympus endoscope was introduced                            through the mouth, and advanced to the second part                            of duodenum. The upper GI endoscopy was                            accomplished without difficulty. The patient                            tolerated the procedure well. Scope In: Scope Out: Findings:      LA Grade D (one or more mucosal breaks involving at least 75% of       esophageal circumference) esophagitis with no bleeding was found 30 to       38 cm from the incisors. Biopsies were taken with a cold forceps for       histology to exclude viral involvement given active chemotherapy use.      A 3 cm hiatal hernia was present.      Severe, localized, mucosal changes characterized by edema, erythema,       granularity, hemorrhagic appearance and nodularity were found in the       gastric fundus. Biopsies were taken with a cold forceps for histology.       Initially this was concerning for a mass lesion but on further       inspection this may be severe retch gastritis.      The exam of the stomach was otherwise normal.      The examined duodenum was normal. Impression:               - LA Grade D erosive esophagitis with no bleeding.                            Biopsied.                           -  3 cm hiatal hernia.                           - Severely edematous, erythematous, granular,                            hemorrhagic appearing and nodular mucosa in the                            gastric fundus. Biopsied. See above.                           - Normal examined duodenum. Moderate Sedation:      N/A Recommendation:           - Return patient to hospital ward for ongoing care.                           - Advance diet as tolerated.                           - Continue present medications. BID PPI x 8 weeks                             then daily. Liquid sucralfate 1 g TIDAC and HS x 1                            week (longer if needed).                           - Await pathology results. Procedure Code(s):        --- Professional ---                           [phone], Esophagogastroduodenoscopy, flexible,                            transoral; with biopsy, single or multiple Diagnosis Code(s):        --- Professional ---                           K20.80, Other esophagitis without bleeding                           K44.9, Diaphragmatic hernia without obstruction or                            gangrene                           K92.2, Gastrointestinal hemorrhage, unspecified                           K31.89, Other diseases of stomach and duodenum                           R10.84, Generalized abdominal pain  R11.2, Nausea with vomiting, unspecified CPT copyright 2022 American Medical Association. All rights reserved. The codes documented in this report are preliminary and upon coder review may  be revised to meet current compliance requirements. Jerene Bears, MD 05/13/2022 2:16:27 PM This report has been signed electronically. Number of Addenda: 0

## 2022-05-13 NOTE — Anesthesia Preprocedure Evaluation (Signed)
Anesthesia Evaluation  Patient identified by MRN, date of birth, ID band Patient awake    Reviewed: Allergy & Precautions, NPO status , Patient's Chart, lab work & pertinent test results  History of Anesthesia Complications Negative for: history of anesthetic complications  Airway Mallampati: III  TM Distance: >3 FB Neck ROM: Full    Dental  (+) Dental Advisory Given   Pulmonary neg pulmonary ROS, former smoker,    breath sounds clear to auscultation       Cardiovascular hypertension, Pt. on medications + CAD, + Past MI, + Cardiac Stents and +CHF   Rhythm:Regular  1. Left ventricular ejection fraction, by estimation, is 30 to 35%. The  left ventricle has moderately decreased function. The left ventricle  demonstrates regional wall motion abnormalities (see scoring  diagram/findings for description). There is mild  left ventricular hypertrophy. Left ventricular diastolic parameters are  indeterminate.  2. Right ventricular systolic function is mildly reduced. The right  ventricular size is normal. Tricuspid regurgitation signal is inadequate  for assessing PA pressure.  3. A small pericardial effusion is present.  4. The mitral valve is grossly normal. Trivial mitral valve  regurgitation. No evidence of mitral stenosis.  5. The aortic valve was not well visualized. There is mild calcification  of the aortic valve. Aortic valve regurgitation is not visualized. No  aortic stenosis is present.  6. The inferior vena cava is normal in size with greater than 50%  respiratory variability, suggesting right atrial pressure of 3 mmHg.    Neuro/Psych PSYCHIATRIC DISORDERS Anxiety  Neuromuscular disease CVA    GI/Hepatic Neg liver ROS, nausea, vomiting, abdominal pain, abnormal CT of rectum   Endo/Other  diabetes  Renal/GU CRFRenal diseaseLab Results      Component                Value               Date                       CREATININE               1.31 (H)            05/13/2022                Musculoskeletal  (+) Arthritis , Fibromyalgia -  Abdominal   Peds  Hematology  (+) Blood dyscrasia, anemia , Lab Results      Component                Value               Date                      WBC                      4.5                 05/13/2022                HGB                      9.7 (L)             05/13/2022                HCT  29.8 (L)            05/13/2022                MCV                      84.9                05/13/2022                PLT                      248                 05/13/2022              Anesthesia Other Findings a. 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 04/2014 Cath/PCI: LM 10-20, LAD 40p, 43m 825mSR(3.5x38 Xience DES), 50apical, LCX 20 diffuse, RI 50p, 2065mR, RCA 40-74m32md.  Reproductive/Obstetrics                             Anesthesia Physical Anesthesia Plan  ASA: 3  Anesthesia Plan: MAC   Post-op Pain Management: Minimal or no pain anticipated   Induction: Intravenous  PONV Risk Score and Plan: 1 and Propofol infusion  Airway Management Planned: Nasal Cannula  Additional Equipment: None  Intra-op Plan:   Post-operative Plan:   Informed Consent: I have reviewed the patients History and Physical, chart, labs and discussed the procedure including the risks, benefits and alternatives for the proposed anesthesia with the patient or authorized representative who has indicated his/her understanding and acceptance.     Dental advisory given  Plan Discussed with: CRNA  Anesthesia Plan Comments:         Anesthesia Quick Evaluation

## 2022-05-13 NOTE — Op Note (Signed)
[facility] Patient Name: Antonio Guerra Procedure Date: 05/13/2022 MRN: [id number] Attending MD: Jerene Bears , MD, [id number] Date of Birth: 1951-06-30 CSN: [id number] Age: 71 Admit Type: Inpatient Procedure:                Flexible Sigmoidoscopy Indications:              Abnormal CT of the abdomen/pelvis suggesting rectal                            wall thickness (of note this was not of concern 4                            days prior on CT PET scan); no rectal symptoms Providers:                Lajuan Lines. Hilarie Fredrickson, MD, Velva Harman, RN, Benetta Spar, Technician Referring MD:             [facility] Medicines:                Monitored Anesthesia Care Complications:            No immediate complications. Estimated Blood Loss:     Estimated blood loss: none. Procedure:                Pre-Anesthesia Assessment:                           - Prior to the procedure, a History and Physical                            was performed, and patient medications and                            allergies were reviewed. The patient's tolerance of                            previous anesthesia was also reviewed. The risks                            and benefits of the procedure and the sedation                            options and risks were discussed with the patient.                            All questions were answered, and informed consent                            was obtained. Prior Anticoagulants: The patient has                            taken no anticoagulant or antiplatelet agents. ASA  Grade Assessment: III - A patient with severe                            systemic disease. After reviewing the risks and                            benefits, the patient was deemed in satisfactory                            condition to undergo the procedure.                           After obtaining informed consent, the  scope was                            passed under direct vision. The flexible                            sigmoidoscopy was accomplished without difficulty.                            The patient tolerated the procedure well. The                            quality of the bowel preparation was poor. The                            [id number] ([id number]) Olympus colonoscope was                            introduced through the anus and advanced to the                            distal sigmoid colon. Scope In: Scope Out: Findings:      The digital rectal exam was normal.      Solid stool was found in the rectum, making visualization difficult.       Stool non-bloody.      The rectum was able to be inspected around the solid stool with views       adequate enough to exclude mass lesion. There was also no evidence of       proctitis in the visualized rectal mucosa.      Retroflexion in the rectum was not performed due to anatomy. Impression:               - Stool in the rectum.                           - The exmained rectum is normal as above. No mass                            lesions seen.                           - No specimens collected. Moderate Sedation:      N/A Recommendation:           -  Return patient to hospital ward for ongoing care.                           - Advance diet as tolerated.                           - See EGD report for additional recommendations. Procedure Code(s):        --- Professional ---                           [phone], Sigmoidoscopy, flexible; diagnostic,                            including collection of specimen(s) by brushing or                            washing, when performed (separate procedure) Diagnosis Code(s):        --- Professional ---                           R93.3, Abnormal findings on diagnostic imaging of                            other parts of digestive tract CPT copyright 2022 American Medical Association. All rights reserved. The  codes documented in this report are preliminary and upon coder review may  be revised to meet current compliance requirements. Jerene Bears, MD 05/13/2022 2:25:26 PM This report has been signed electronically. Number of Addenda: 0

## 2022-05-13 NOTE — TOC Initial Note (Signed)
Transition of Care [facility]) - Initial/Assessment Note    Patient Details  Name: Antonio Guerra MRN: [id number] Date of Birth: January 28, 1951  Transition of Care [facility]) CM/SW Contact:    Leeroy Cha, RN Phone Number: 05/13/2022, 8:35 AM  Clinical Narrative:                 Pt is from the [facility] program.  Transition of Care [facility]) Screening Note   Patient Details  Name: Antonio Guerra Date of Birth: 1951/06/02   Transition of Care [facility]) CM/SW Contact:    Leeroy Cha, RN Phone Number: 05/13/2022, 8:35 AM    Transition of Care Department [facility]) has reviewed patient and no TOC needs have been identified at this time. We will continue to monitor patient advancement through interdisciplinary progression rounds. If new patient transition needs arise, please place a TOC consult.    Expected Discharge Plan: Home/Self Care Barriers to Discharge: Continued Medical Work up   Patient Goals and CMS Choice Patient states their goals for this hospitalization and ongoing recovery are:: to go back to my life CMS Medicare.gov Compare Post Acute Care list provided to:: Patient Choice offered to / list presented to : Spouse  Expected Discharge Plan and Services Expected Discharge Plan: Home/Self Care   Discharge Planning Services: CM Consult   Living arrangements for the past 2 months: Apartment                                      Prior Living Arrangements/Services Living arrangements for the past 2 months: Apartment Lives with:: Spouse Patient language and need for interpreter reviewed:: Yes Do you feel safe going back to the place where you live?: Yes            Criminal Activity/Legal Involvement Pertinent to Current Situation/Hospitalization: No - Comment as needed  Activities of Daily Living Home Assistive Devices/Equipment: None ADL Screening (condition at time of admission) Patient's cognitive ability adequate to safely complete daily  activities?: Yes Is the patient deaf or have difficulty hearing?: No Does the patient have difficulty seeing, even when wearing glasses/contacts?: No Does the patient have difficulty concentrating, remembering, or making decisions?: No Patient able to express need for assistance with ADLs?: Yes Does the patient have difficulty dressing or bathing?: Yes Independently performs ADLs?: No Communication: Independent Dressing (OT): Needs assistance Grooming: Needs assistance Feeding: Needs assistance Bathing: Needs assistance In/Out Bed: Needs assistance Walks in Home: Needs assistance Does the patient have difficulty walking or climbing stairs?: Yes Weakness of Legs: Both Weakness of Arms/Hands: None  Permission Sought/Granted                  Emotional Assessment Appearance:: Appears stated age Attitude/Demeanor/Rapport: Engaged Affect (typically observed): Calm Orientation: : Oriented to Self, Oriented to Place, Oriented to  Time, Oriented to Situation Alcohol / Substance Use: Tobacco Use, Alcohol Use (history of tobacco and etoh use not currently) Psych Involvement: No (comment)  Admission diagnosis:  AKI (acute kidney injury) ([place]) [N17.9] Acute kidney injury superimposed on chronic kidney disease ([place]) [N17.9, N18.9] Patient Active Problem List   Diagnosis Date Noted   AKI (acute kidney injury) ([place]) 05/11/2022   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   Merkel cell carcinoma nodal presentation ([place]) 02/16/2022   Inguinal lymphadenopathy 01/26/2022   Stage 3a chronic kidney disease ([place]) 11/08/2019   History of angina 11/08/2019   Intermittent diarrhea  11/08/2019   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   Alcohol intoxication ([place]) 12/19/2015   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

## 2022-05-13 NOTE — Transfer of Care (Signed)
Immediate Anesthesia Transfer of Care Note  Patient: Chantry Headen  Procedure(s) Performed: ESOPHAGOGASTRODUODENOSCOPY (EGD) WITH PROPOFOL FLEXIBLE SIGMOIDOSCOPY BIOPSY  Patient Location: PACU  Anesthesia Type:MAC  Level of Consciousness: sedated  Airway & Oxygen Therapy: Patient Spontanous Breathing and Patient connected to face mask oxygen  Post-op Assessment: Report given to RN and Post -op Vital signs reviewed and stable  Post vital signs: Reviewed and stable  Last Vitals:  Vitals Value Taken Time  BP 95/46 05/13/22 1411  Temp 36.1 C 05/13/22 1411  Pulse 114 05/13/22 1413  Resp 24 05/13/22 1413  SpO2 100 % 05/13/22 1413  Vitals shown include unvalidated device data.  Last Pain:  Vitals:   05/13/22 1411  TempSrc: Tympanic  PainSc: Asleep      Patients Stated Pain Goal: 3 (16/10/96 0454)  Complications: No notable events documented.

## 2022-05-13 NOTE — Anesthesia Postprocedure Evaluation (Signed)
Anesthesia Post Note  Patient: Antonio Guerra  Procedure(s) Performed: ESOPHAGOGASTRODUODENOSCOPY (EGD) WITH PROPOFOL FLEXIBLE SIGMOIDOSCOPY BIOPSY     Patient location during evaluation: Endoscopy Anesthesia Type: MAC Level of consciousness: awake and alert Pain management: pain level controlled Vital Signs Assessment: post-procedure vital signs reviewed and stable Respiratory status: spontaneous breathing, nonlabored ventilation and respiratory function stable Cardiovascular status: stable and blood pressure returned to baseline Postop Assessment: no apparent nausea or vomiting Anesthetic complications: no   No notable events documented.  Last Vitals:  Vitals:   05/13/22 1441 05/13/22 1451  BP: (!) 148/69 (!) 148/59  Pulse: (!) 112 (!) 107  Resp: 20 11  Temp:    SpO2: 99% 97%    Last Pain:  Vitals:   05/13/22 1451  TempSrc:   PainSc: 4                  [name]

## 2022-05-13 NOTE — Progress Notes (Signed)
Patient ID: Antonio Guerra, male   DOB: 02-26-1951, 72 y.o.   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 undergoing treatment with Keytruda  Labs today-WBC 4.5/hemoglobin 9.7/hematocrit 29.8 BUN 23/creatinine 1.3 improved Albumin 2.9  Pt says he feels better - less abd pain, no vomiting   Objective   Vital signs in last 24 hours: Temp:  [97.9 F (36.6 C)-99.7 F (37.6 C)] 97.9 F (36.6 C) (10/25 0412) Pulse Rate:  [102-114] 106 (10/25 0412) Resp:  [14-20] 20 (10/25 0412) BP: (138-169)/(82-98) 150/84 (10/25 0412) SpO2:  [95 %-100 %] 99 % (10/25 0412) Weight:  [79.5 kg] 79.5 kg (10/25 0500) Last BM Date :  (unknown) General:   Elderly African-American male in NAD Heart:  Regular rate and rhythm; no murmurs Lungs: Respirations even and unlabored, lungs CTA bilaterally Abdomen:  Soft,  mild rather generalized tenderness ,snondistended. Normal bowel sounds. Extremities:  Without edema. Neurologic:  Alert and oriented,  grossly normal neurologically. Psych:  Cooperative. Normal mood and affect.  Intake/Output from previous day: 10/24 0701 - 10/25 0700 In: 1194.6 [I.V.:1194.6] Out: 250 [Urine:250] Intake/Output this shift: No intake/output data recorded.  Lab Results: Recent Labs    05/11/22 1922 05/12/22 0459 05/13/22 0345  WBC 6.6 7.6 4.5  HGB 11.9* 9.9* 9.7*  HCT 37.3* 30.4* 29.8*  PLT 298 245 248   BMET Recent Labs    05/11/22 1922 05/12/22 0459 05/13/22 0345  NA 137 140 139  K 4.3 3.8 3.9  CL 99 103 104  CO2 '23 28 26  '$ GLUCOSE 168* 128* 101*  BUN 35* 30* 23  CREATININE 2.66* 2.06* 1.31*  CALCIUM 9.4 8.7* 8.6*   LFT Recent Labs    05/13/22 0345  PROT 6.3*  ALBUMIN 2.9*  AST 26  ALT 35  ALKPHOS 63  BILITOT 1.0   PT/INR No results for input(s): "LABPROT", "INR" in the last 72 hours.  Studies/Results: CT ABDOMEN PELVIS WO  CONTRAST  Result Date: 05/11/2022 CLINICAL DATA:  Abdominal pain with nausea and vomiting. EXAM: CT ABDOMEN AND PELVIS WITHOUT CONTRAST TECHNIQUE: Multidetector CT imaging of the abdomen and pelvis was performed following the standard protocol without IV contrast. RADIATION DOSE REDUCTION: This exam was performed according to the departmental dose-optimization program which includes automated exposure control, adjustment of the mA and/or kV according to patient size and/or use of iterative reconstruction technique. COMPARISON:  May 04, 2022 FINDINGS: Lower chest: Mild atelectatic changes are seen within the right middle lobe and bilateral lung bases. Hepatobiliary: No focal liver abnormality is seen. No gallstones, gallbladder wall thickening, or biliary dilatation. Pancreas: Numerous tiny parenchymal calcifications are seen scattered throughout the pancreas. There is no evidence of pancreatic ductal dilatation or surrounding inflammatory fat stranding. Spleen: Normal in size without focal abnormality. Adrenals/Urinary Tract: Adrenal glands are unremarkable. Kidneys are normal in size, without obstructing renal calculi or hydronephrosis. 2 mm nonobstructing renal calculi are seen within both kidneys. Mild focal cortical scarring is seen along the posterolateral aspect of the mid to lower left kidney. Bladder is unremarkable. Stomach/Bowel: There is a small, stable hiatal hernia. Appendix appears normal. Stool is seen throughout the large bowel. No evidence of bowel dilatation. Mild, asymmetric anterior and right lateral rectal wall thickening is seen (axial CT images 86 through 88, CT series 2). Vascular/Lymphatic: Aortic atherosclerosis. Multiple stable enlarged right pelvic and right inguinal lymph nodes are  seen (the largest measuring approximately 3.3 cm x 2.8 cm), with a stable 7.3 cm x 7.5 cm necrotic right inguinal mass. Reproductive: The prostate gland is mildly enlarged and unchanged in size. Other:  A stable 16 mm x 11 mm fat containing umbilical hernia is seen. No abdominopelvic ascites. Musculoskeletal: No acute or significant osseous findings. IMPRESSION: 1. Mild, asymmetric anterior and right lateral rectal wall thickening, as described above. While this may be, in part, secondary to underdistention, sequelae associated with an underlying neoplastic process cannot be excluded. Correlation with colonoscopy is recommended. 2. Stable right pelvic and right inguinal lymphadenopathy with a stable 7.3 cm x 7.5 cm necrotic right inguinal mass. 3. Small, stable hiatal hernia. 4. Bilateral 2 mm nonobstructing renal calculi. 5. Aortic atherosclerosis. Aortic Atherosclerosis (ICD10-I70.0). Electronically Signed   By: Virgina Norfolk M.D.   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  Very recent PET scan last week showed increased right pelvic adenopathy and new right pelvic sidewall adenopathy, with some interval decrease in FDG uptake of the dominant mass.  Contrasted CT on admission read as stable right pelvic and right inguinal adenopathy, and there was mention of right lateral rectal wall thickening unclear whether this was due to under distention or sequelae of neoplastic process  Etiology of the abdominal pain nausea and vomiting are not clear though suspected to be secondary to [facility]. Rule out peptic ulcer disease, gastropathy, vs symptoms secondary to progression of disease.,  Consider gastroparesis  #2 acute kidney injury superimposed on chronic kidney disease stage III #3 prior CVA #4 congestive heart failure with EF 30 to 35% #5.  Adult onset diabetes mellitus #6 CAD  status post prior stents   Plan; keep n.p.o. Politte can proceed with EGD  and flexible sigmoidoscopy early this afternoon with Dr. Hilarie Fredrickson 2 fleets enemas prior to flex Continue antiemetics IV as needed, daily PPI. 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

## 2022-05-14 ENCOUNTER — Ambulatory Visit: Payer: Medicare (Managed Care)

## 2022-05-14 DIAGNOSIS — K208 Other esophagitis without bleeding: Secondary | ICD-10-CM

## 2022-05-14 DIAGNOSIS — E782 Mixed hyperlipidemia: Secondary | ICD-10-CM

## 2022-05-14 DIAGNOSIS — C4A9 Merkel cell carcinoma, unspecified: Secondary | ICD-10-CM | POA: Diagnosis not present

## 2022-05-14 DIAGNOSIS — N179 Acute kidney failure, unspecified: Secondary | ICD-10-CM | POA: Diagnosis not present

## 2022-05-14 DIAGNOSIS — N1831 Chronic kidney disease, stage 3a: Secondary | ICD-10-CM | POA: Diagnosis not present

## 2022-05-14 DIAGNOSIS — I5022 Chronic systolic (congestive) heart failure: Secondary | ICD-10-CM | POA: Diagnosis not present

## 2022-05-14 LAB — GLUCOSE, CAPILLARY: Glucose-Capillary: 108 mg/dL — ABNORMAL HIGH (ref 70–99)

## 2022-05-14 LAB — MAGNESIUM: Magnesium: 1.7 mg/dL (ref 1.7–2.4)

## 2022-05-14 LAB — CBC WITH DIFFERENTIAL/PLATELET
Abs Immature Granulocytes: 0.01 10*3/uL (ref 0.00–0.07)
Basophils Absolute: 0 10*3/uL (ref 0.0–0.1)
Basophils Relative: 0 %
Eosinophils Absolute: 0.1 10*3/uL (ref 0.0–0.5)
Eosinophils Relative: 2 %
HCT: 27.5 % — ABNORMAL LOW (ref 39.0–52.0)
Hemoglobin: 9.3 g/dL — ABNORMAL LOW (ref 13.0–17.0)
Immature Granulocytes: 0 %
Lymphocytes Relative: 25 %
Lymphs Abs: 1 10*3/uL (ref 0.7–4.0)
MCH: 28.3 pg (ref 26.0–34.0)
MCHC: 33.8 g/dL (ref 30.0–36.0)
MCV: 83.6 fL (ref 80.0–100.0)
Monocytes Absolute: 0.6 10*3/uL (ref 0.1–1.0)
Monocytes Relative: 14 %
Neutro Abs: 2.4 10*3/uL (ref 1.7–7.7)
Neutrophils Relative %: 59 %
Platelets: 232 10*3/uL (ref 150–400)
RBC: 3.29 MIL/uL — ABNORMAL LOW (ref 4.22–5.81)
RDW: 15.5 % (ref 11.5–15.5)
WBC: 4.1 10*3/uL (ref 4.0–10.5)
nRBC: 0 % (ref 0.0–0.2)

## 2022-05-14 LAB — COMPREHENSIVE METABOLIC PANEL WITH GFR
ALT: 28 U/L (ref 0–44)
AST: 23 U/L (ref 15–41)
Albumin: 3 g/dL — ABNORMAL LOW (ref 3.5–5.0)
Alkaline Phosphatase: 70 U/L (ref 38–126)
Anion gap: 6 (ref 5–15)
BUN: 17 mg/dL (ref 8–23)
CO2: 25 mmol/L (ref 22–32)
Calcium: 8.6 mg/dL — ABNORMAL LOW (ref 8.9–10.3)
Chloride: 105 mmol/L (ref 98–111)
Creatinine, Ser: 1.12 mg/dL (ref 0.61–1.24)
GFR, Estimated: >60 mL/min
Glucose, Bld: 109 mg/dL — ABNORMAL HIGH (ref 70–99)
Potassium: 3.7 mmol/L (ref 3.5–5.1)
Sodium: 136 mmol/L (ref 135–145)
Total Bilirubin: 0.8 mg/dL (ref 0.3–1.2)
Total Protein: 6.3 g/dL — ABNORMAL LOW (ref 6.5–8.1)

## 2022-05-14 MED ORDER — MAGNESIUM SULFATE 2 GM/50ML IV SOLN
2.0000 g | Freq: Once | INTRAVENOUS | Status: AC
  Administered 2022-05-14: 2 g via INTRAVENOUS
  Filled 2022-05-14: qty 50

## 2022-05-14 NOTE — Care Management Important Message (Signed)
Important Message  Patient Details IM Letter placed in Patients room. Name: Antonio Guerra MRN: [id number] Date of Birth: 05/21/1951   Medicare Important Message Given:  Yes     Kerin Salen 05/14/2022, 11:04 AM

## 2022-05-14 NOTE — Progress Notes (Signed)
DISCONTINUE OFF PATHWAY REGIMEN - Other   [id number]:Pembrolizumab 200 mg IV D1 q21 Days:   A cycle is every 21 days:     Pembrolizumab   **Always confirm dose/schedule in your pharmacy ordering system**  REASON: Disease Progression PRIOR TREATMENT: Pembrolizumab 200 mg IV D1 q21 Days TREATMENT RESPONSE: Progressive Disease (PD)  START OFF PATHWAY REGIMEN - Other   [id number]:Carboplatin AUC=5 IV D1 + Etoposide 100 mg/m2 IV D1-3 q21 Days:   A cycle is every 21 days:     Carboplatin      Etoposide   **Always confirm dose/schedule in your pharmacy ordering system**  Patient Characteristics: Intent of Therapy: Non-Curative / Palliative Intent, Discussed with Patient 

## 2022-05-14 NOTE — Progress Notes (Signed)
PROGRESS NOTE    Antonio Guerra  RJJ:[id number] DOB: February 17, 1951 DOA: 05/11/2022 PCP: [name], MD    Chief Complaint  Antonio Guerra presents with   Abdominal Pain    Brief Narrative:  Antonio Guerra 71 year old malewith Merkel cell carcinoma, currently being treated with Keytruda (followed by Dr. Chryl Heck), CAD, HTN, HLP, diabetes mellitus, prior CVA presented to ED with diffuse abdominal pain for 1 week, associated with nausea and vomiting x3 weeks.  No fevers. In ED, CT abdomen and pelvis showed right pelvic and right inguinal lymphadenopathy with a stable 7.3x 7.5 cm necrotic right inguinal mass.  Mild, symmetric anterior and right lateral rectal wall thickening associated with an underlying neoplastic process cannot be excluded.  Recommended colonoscopy. Creatinine 2.66, baseline 1.6.  AST 54, ALT 68, total bilirubin 1.3.  Hemoglobin 11.9 -Antonio Guerra seen in consultation by GI and Antonio Guerra for upper endoscopy and flex sigmoidoscopy 05/13/2022 for further evaluation.     Assessment & Plan:   Principal Problem:   AKI (acute kidney injury) ([place]) Active Problems:   Esophagitis, [place] grade D   Hyperlipidemia   Coronary artery disease   Chronic systolic congestive heart failure (HCC)   Type 2 diabetes mellitus (HCC)   Epigastric pain   Stage 3a chronic kidney disease (HCC)   Merkel cell carcinoma (HCC)   Acute kidney injury superimposed on chronic kidney disease (HCC)   Nausea and vomiting   Dehydration   Acute esophagitis   Gastritis without bleeding   Abnormal CT scan, colon   #1 acute kidney injury superimposed on CKD stage IIIb -Likely secondary to prerenal azotemia in the setting of dehydration from nausea and vomiting, poor oral intake over the past 3 weeks per daughter. -Creatinine on admission noted at 2.6.  Baseline creatinine 1.6. -Urinalysis, nitrite negative, leukocytes negative, greater than 300 protein, specific gravity 1.032. -CT abdomen and pelvis done on  admission negative for hydronephrosis or obstruction renal calculi. -Renal function trending down improving with hydration creatinine currently at 1.12. -Antonio Guerra started on clear liquids and has been advanced to a full liquid diet. -Decrease IV fluid rate. -Supportive care.  2.  Nausea/vomiting/abdominal pain in the setting of malignancy/history of gastritis/Merkel cell carcinoma currently being treated with Keytruda likely secondary to severe erosive esophagitis/LA grade D esophagitis, severe gastritis -Antonio Guerra improving clinically since admission with IV fluids, IV antiemetics, IV PPI.   -Antonio Guerra today with improvement with abdominal pain.  -CT abdomen and pelvis done with mild symmetric rectal wall thickening, cannot exclude neoplastic process, right inguinal mass unchanged. -Oncology, Dr. Chryl Heck consulted and and formal consultation pending. -Antonio Guerra seen in consultation by GI and concerned that etiology of Antonio Guerra's symptoms of abdominal pain nausea and vomiting may be secondary to [place], peptic ulcer disease versus gastropathy versus progression of disease versus gastroparesis. -Antonio Guerra status post EGD and flex sigmoidoscopy on 05/13/2022 with LA grade D severe erosive esophagitis noted, severe gastritis noted.  Flexible sigmoidoscopy with no proctitis, no mass seen in the examined portion of the rectum as difficult visualization due to stool in the rectum.  Biopsies were obtained and pending. -GI recommending PPI twice daily x8 weeks then daily in addition to Carafate suspension 3 times daily before meals and at bedtime x1 week.  -Antonio Guerra currently on a full liquid diet which she seems to be tolerating and asking for more solid diet. -If continued clinical improvement will advance to a soft diet for breakfast. -GI following and appreciate input and recommendations.  3.  Dehydration -IV fluids.  4.  Merkel cell carcinoma currently treated with Keytruda -CT abdomen and pelvis showing  stable right pelvic and right inguinal lymphadenopathy with stable 7.3 cm x 7.5 cm necrotic right inguinal mass.  Mild symmetric anterior and right lateral rectal wall thickening associated underlying neoplastic process cannot be excluded. -Antonio Guerra for flexible sigmoidoscopy difficult to visualize with stool in the rectum but examined portion of rectum was normal, no proctitis or mass seen.  -GI following, oncology following.  5.  Transaminitis -Likely side effect from [facility] -LFTs trending down. -Follow.  6.  Chronic systolic CHF/CAD -Antonio Guerra noted to be hypervolemic on examination. -Last 2D echo from 04/09/2022 with EF of 30 to 35%, strict I's and O's, daily weights. -Saline lock IV fluids.   -Continue home regimen Coreg, isosorbide.    7.  Type 2 diabetes with hyperglycemia -Hemoglobin A1c 5.3 (05/12/2022) -CBG 108 this morning. -SSI.  8.  Generalized weakness -PT/ OT.    DVT prophylaxis: Lovenox Code Status: Full Family Communication: Updated Antonio Guerra.  No family at bedside.   Disposition: TBD  Status is: Inpatient Remains inpatient appropriate because: Severity of illness   Consultants:  Gastroenterology: Dr. Hilarie Fredrickson 05/12/2022 Wound care RN, Phineas Douglas, RN 05/12/2022  Procedures:  CT abdomen and pelvis 05/11/2022 Upper endoscopy 05/13/2022: Per Dr. Hilarie Fredrickson Flexible sigmoidoscopy 05/13/2022 per Dr. Hilarie Fredrickson  Antimicrobials:  None   Subjective: Antonio Guerra sitting up in bed.  Overall feeling better.  Denies any further nausea or vomiting.  States abdominal pain improved.  Tolerating current full liquid diet.  Asking for a burger.  Objective: Vitals:   05/14/22 0900 05/14/22 1000 05/14/22 1100 05/14/22 1118  BP:    128/82  Pulse:    100  Resp: '20 14 14 16  '$ Temp:    98.3 F (36.8 C)  TempSrc:    Oral  SpO2:    100%  Weight:      Height:        Intake/Output Summary (Last 24 hours) at 05/14/2022 1241 Last data filed at 05/14/2022 1142 Gross per 24 hour   Intake 1050 ml  Output 1050 ml  Net 0 ml    Filed Weights   05/11/22 1858 05/13/22 0500 05/14/22 0500  Weight: 88.9 kg 79.5 kg 77.9 kg    Examination:  General exam: NAD Respiratory system: Lungs clear to auscultation bilaterally anterior lung fields with, no wheezes, no crackles, no rhonchi.  Fair air movement.  Speaking in full sentences.   Cardiovascular system: RRR no murmurs rubs or gallops.  No JVD.  No lower extremity edema.  Gastrointestinal system: Abdomen is soft, nontender, nondistended, positive bowel sounds.  No rebound.  No guarding.   Central nervous system: Alert and oriented. No focal neurological deficits. Extremities: Symmetric 5 x 5 power. Skin: No rashes, lesions or ulcers Psychiatry: Judgement and insight appear normal. Mood & affect appropriate.     Data Reviewed: I have personally reviewed following labs and imaging studies  CBC: Recent Labs  Lab 05/11/22 1922 05/12/22 0459 05/13/22 0345 05/14/22 0346  WBC 6.6 7.6 4.5 4.1  NEUTROABS 5.0 5.3  --  2.4  HGB 11.9* 9.9* 9.7* 9.3*  HCT 37.3* 30.4* 29.8* 27.5*  MCV 86.7 85.6 84.9 83.6  PLT 298 245 248 232     Basic Metabolic Panel: Recent Labs  Lab 05/11/22 1922 05/12/22 0459 05/13/22 0345 05/14/22 0346  NA 137 140 139 136  K 4.3 3.8 3.9 3.7  CL 99 103 104 105  CO2 '23 28 26 25  '$ GLUCOSE 168* 128*  101* 109*  BUN 35* 30* 23 17  CREATININE 2.66* 2.06* 1.31* 1.12  CALCIUM 9.4 8.7* 8.6* 8.6*  MG  --  2.0  --  1.7  PHOS  --  4.6  --   --      GFR: Estimated Creatinine Clearance: 67.6 mL/min (by C-G formula based on SCr of 1.12 mg/dL).  Liver Function Tests: Recent Labs  Lab 05/11/22 1922 05/12/22 0459 05/13/22 0345 05/14/22 0346  AST 54* 34 26 23  ALT 68* 50* 35 28  ALKPHOS 89 71 63 70  BILITOT 1.3* 1.0 1.0 0.8  PROT 8.4* 6.9 6.3* 6.3*  ALBUMIN 3.9 3.2* 2.9* 3.0*     CBG: Recent Labs  Lab 05/13/22 0746 05/13/22 1145 05/13/22 1643 05/13/22 2210 05/14/22 0722  GLUCAP 97  106* 85 125* 108*      No results found for this or any previous visit (from the past 240 hour(s)).       Radiology Studies: No results found.      Scheduled Meds:  carvedilol  12.5 mg Oral BID WC   cycloSPORINE  1 drop Both Eyes BID   enoxaparin (LOVENOX) injection  40 mg Subcutaneous Q24H   gabapentin  400 mg Oral BID   insulin aspart  0-5 Units Subcutaneous QHS   insulin aspart  0-9 Units Subcutaneous TID WC   isosorbide mononitrate  10 mg Oral Daily   pantoprazole (PROTONIX) IV  40 mg Intravenous Q12H   sucralfate  1 g Oral Q6H   Continuous Infusions:     LOS: 3 days    Time spent: 40 mins    Irine Seal, MD [facility]   To contact the attending provider between 7A-7P or the covering provider during after hours 7P-7A, please log into the web site [email or web address] and access using universal [place] password for that web site. If you do not have the password, please call the hospital operator.  05/14/2022, 12:41 PM

## 2022-05-14 NOTE — Progress Notes (Signed)
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 Admitted with N/V/ abdominal pain and AKI on CKD. Seen in consultation by [place] on  10/24  Assessment / Plan    # 71 yo male with recently diagnosed Merkel cell carcinoma presenting as right inguinal mass. On Keytruda  # Abnormal imaging of rectum. CT scan suggested right lateral rectal wall thickening, possibly due to underdistention vrs neoplasm. Flexible sigmoidoscopy yesterday >> difficult visualization with stool in rectum but examined portion of rectum was normal. No proctitis or mass seen.   # Severe erosive esophagitis severe gastritis, possibly cause for the abdominal pain and vomiting.  BID PPI x 8 weeks then daily Carafate suspension TID ac and HS x 1 week Await biopsies    Subjective  Tolerating clear liquids. No N/V or abdominal pain this am    Objective   Endoscopic studies:   Flexible sigmoidoscopy  -The rectum was able to be inspected around the solid stool with views adequate enough to exclude mass lesion. There was also no evidence of proctitis in the visualized rectal mucosa.  EGD -Grade D erosive esophagitis -Severely edematous, erythematous, granular, hemorrhagic appearing and nodular mucosa in the gastric fundus. Biopsied. See above. - Normal examined duodenum.    Imaging:  CT ABDOMEN PELVIS WO CONTRAST  Result Date: 05/11/2022 CLINICAL DATA:  Abdominal pain with nausea and vomiting. EXAM: CT ABDOMEN AND PELVIS WITHOUT CONTRAST TECHNIQUE: Multidetector CT imaging of the abdomen and pelvis was performed following the standard protocol without IV contrast. RADIATION DOSE REDUCTION: This exam was performed according to the departmental dose-optimization program which includes automated exposure control, adjustment of the mA  and/or kV according to patient size and/or use of iterative reconstruction technique. COMPARISON:  May 04, 2022 FINDINGS: Lower chest: Mild atelectatic changes are seen within the right middle lobe and bilateral lung bases. Hepatobiliary: No focal liver abnormality is seen. No gallstones, gallbladder wall thickening, or biliary dilatation. Pancreas: Numerous tiny parenchymal calcifications are seen scattered throughout the pancreas. There is no evidence of pancreatic ductal dilatation or surrounding inflammatory fat stranding. Spleen: Normal in size without focal abnormality. Adrenals/Urinary Tract: Adrenal glands are unremarkable. Kidneys are normal in size, without obstructing renal calculi or hydronephrosis. 2 mm nonobstructing renal calculi are seen within both kidneys. Mild focal cortical scarring is seen along the posterolateral aspect of the mid to lower left kidney. Bladder is unremarkable. Stomach/Bowel: There is a small, stable hiatal hernia. Appendix appears normal. Stool is seen throughout the large bowel. No evidence of bowel dilatation. Mild, asymmetric anterior and right lateral rectal wall thickening is seen (axial CT images 86 through 88, CT series 2). Vascular/Lymphatic: Aortic atherosclerosis. Multiple stable enlarged right pelvic and right inguinal lymph nodes are seen (the largest measuring approximately 3.3 cm x 2.8 cm), with a stable 7.3 cm x 7.5 cm necrotic right inguinal mass. Reproductive: The prostate gland is mildly enlarged and unchanged in size. Other: A stable 16 mm x 11 mm fat containing umbilical hernia is seen. No abdominopelvic ascites. Musculoskeletal: No acute or significant osseous findings. IMPRESSION: 1. Mild, asymmetric anterior and right lateral rectal wall thickening, as described above. While this may be, in part, secondary to underdistention, sequelae associated with an underlying neoplastic process cannot be excluded. Correlation with colonoscopy is recommended. 2.  Stable  right pelvic and right inguinal lymphadenopathy with a stable 7.3 cm x 7.5 cm necrotic right inguinal mass. 3. Small, stable hiatal hernia. 4. Bilateral 2 mm nonobstructing renal calculi. 5. Aortic atherosclerosis. Aortic Atherosclerosis (ICD10-I70.0). Electronically Signed   By: Virgina Norfolk M.D.   On: 05/11/2022 21:16    Lab Results: Recent Labs    05/12/22 0459 05/13/22 0345 05/14/22 0346  WBC 7.6 4.5 4.1  HGB 9.9* 9.7* 9.3*  HCT 30.4* 29.8* 27.5*  PLT 245 248 232   BMET Recent Labs    05/12/22 0459 05/13/22 0345 05/14/22 0346  NA 140 139 136  K 3.8 3.9 3.7  CL 103 104 105  CO2 '28 26 25  '$ GLUCOSE 128* 101* 109*  BUN 30* 23 17  CREATININE 2.06* 1.31* 1.12  CALCIUM 8.7* 8.6* 8.6*   LFT Recent Labs    05/14/22 0346  PROT 6.3*  ALBUMIN 3.0*  AST 23  ALT 28  ALKPHOS 70  BILITOT 0.8   PT/INR No results for input(s): "LABPROT", "INR" in the last 72 hours.   Scheduled inpatient medications:   carvedilol  12.5 mg Oral BID WC   cycloSPORINE  1 drop Both Eyes BID   enoxaparin (LOVENOX) injection  40 mg Subcutaneous Q24H   gabapentin  400 mg Oral BID   insulin aspart  0-5 Units Subcutaneous QHS   insulin aspart  0-9 Units Subcutaneous TID WC   isosorbide mononitrate  10 mg Oral Daily   pantoprazole (PROTONIX) IV  40 mg Intravenous Q12H   sucralfate  1 g Oral Q6H   Continuous inpatient infusions:   lactated ringers 75 mL/hr at 05/14/22 0711   PRN inpatient medications: acetaminophen, HYDROmorphone (DILAUDID) injection, labetalol, melatonin, ondansetron (ZOFRAN) IV, polyethylene glycol, prochlorperazine  Vital signs in last 24 hours: Temp:  [96.9 F (36.1 C)-99.2 F (37.3 C)] 99.2 F (37.3 C) (10/26 0726) Pulse Rate:  [103-116] 115 (10/26 0726) Resp:  [11-21] 20 (10/26 0900) BP: (95-159)/(46-100) 119/79 (10/26 0726) SpO2:  [97 %-100 %] 99 % (10/26 0726) Weight:  [77.9 kg] 77.9 kg (10/26 0500) Last BM Date : 05/11/22  Intake/Output Summary (Last  24 hours) at 05/14/2022 1009 Last data filed at 05/13/2022 1655 Gross per 24 hour  Intake --  Output 300 ml  Net -300 ml    Intake/Output from previous day: 10/25 0701 - 10/26 0700 In: -  Out: 300 [Urine:300] Intake/Output this shift: No intake/output data recorded.   Physical Exam:  General: Alert male in NAD Heart:  Regular rate and rhythm. No lower extremity edema Pulmonary: Normal respiratory effort Abdomen: Soft, nondistended, mild generalized epigastric tenderness. Normal bowel sounds.  Neurologic: Alert and oriented Psych: Pleasant. Cooperative.    Principal Problem:   AKI (acute kidney injury) ([place]) Active Problems:   Hyperlipidemia   Coronary artery disease   Chronic systolic congestive heart failure ([place])   Type 2 diabetes mellitus ([facility])   Epigastric pain   Stage 3a chronic kidney disease (HCC)   Merkel cell carcinoma (HCC)   Acute kidney injury superimposed on chronic kidney disease (H[facility])   Nausea and vomiting   Dehydration   Acute esophagitis   Gastritis without bleeding   Abnormal CT scan, colon     LOS: 3 days   Tye Savoy ,NP 05/14/2022, 10:09 AM

## 2022-05-15 ENCOUNTER — Encounter (HOSPITAL_COMMUNITY): Payer: Self-pay | Admitting: Internal Medicine

## 2022-05-15 ENCOUNTER — Other Ambulatory Visit (HOSPITAL_COMMUNITY): Payer: Self-pay

## 2022-05-15 ENCOUNTER — Ambulatory Visit: Payer: Medicare (Managed Care)

## 2022-05-15 ENCOUNTER — Encounter: Payer: Self-pay | Admitting: Hematology and Oncology

## 2022-05-15 DIAGNOSIS — K209 Esophagitis, unspecified without bleeding: Secondary | ICD-10-CM

## 2022-05-15 DIAGNOSIS — E860 Dehydration: Secondary | ICD-10-CM | POA: Diagnosis not present

## 2022-05-15 DIAGNOSIS — I5022 Chronic systolic (congestive) heart failure: Secondary | ICD-10-CM | POA: Diagnosis not present

## 2022-05-15 DIAGNOSIS — K2970 Gastritis, unspecified, without bleeding: Secondary | ICD-10-CM

## 2022-05-15 DIAGNOSIS — N179 Acute kidney failure, unspecified: Secondary | ICD-10-CM | POA: Diagnosis not present

## 2022-05-15 LAB — CBC WITH DIFFERENTIAL/PLATELET
Abs Immature Granulocytes: 0.01 10*3/uL (ref 0.00–0.07)
Basophils Absolute: 0 10*3/uL (ref 0.0–0.1)
Basophils Relative: 0 %
Eosinophils Absolute: 0.1 10*3/uL (ref 0.0–0.5)
Eosinophils Relative: 2 %
HCT: 28.6 % — ABNORMAL LOW (ref 39.0–52.0)
Hemoglobin: 9.4 g/dL — ABNORMAL LOW (ref 13.0–17.0)
Immature Granulocytes: 0 %
Lymphocytes Relative: 26 %
Lymphs Abs: 1 10*3/uL (ref 0.7–4.0)
MCH: 27.7 pg (ref 26.0–34.0)
MCHC: 32.9 g/dL (ref 30.0–36.0)
MCV: 84.4 fL (ref 80.0–100.0)
Monocytes Absolute: 0.7 10*3/uL (ref 0.1–1.0)
Monocytes Relative: 18 %
Neutro Abs: 2 10*3/uL (ref 1.7–7.7)
Neutrophils Relative %: 54 %
Platelets: 233 10*3/uL (ref 150–400)
RBC: 3.39 MIL/uL — ABNORMAL LOW (ref 4.22–5.81)
RDW: 15.3 % (ref 11.5–15.5)
WBC: 3.7 10*3/uL — ABNORMAL LOW (ref 4.0–10.5)
nRBC: 0 % (ref 0.0–0.2)

## 2022-05-15 LAB — BASIC METABOLIC PANEL
Anion gap: 5 (ref 5–15)
BUN: 11 mg/dL (ref 8–23)
CO2: 25 mmol/L (ref 22–32)
Calcium: 8.8 mg/dL — ABNORMAL LOW (ref 8.9–10.3)
Chloride: 106 mmol/L (ref 98–111)
Creatinine, Ser: 0.99 mg/dL (ref 0.61–1.24)
GFR, Estimated: >60 mL/min (ref >60)
Glucose, Bld: 106 mg/dL — ABNORMAL HIGH (ref 70–99)
Potassium: 3.9 mmol/L (ref 3.5–5.1)
Sodium: 136 mmol/L (ref 135–145)

## 2022-05-15 LAB — MAGNESIUM: Magnesium: 2 mg/dL (ref 1.7–2.4)

## 2022-05-15 LAB — GLUCOSE, CAPILLARY
Glucose-Capillary: 139 mg/dL — ABNORMAL HIGH (ref 70–99)
Glucose-Capillary: 134 mg/dL — ABNORMAL HIGH (ref 70–99)
Glucose-Capillary: 112 mg/dL — ABNORMAL HIGH (ref 70–99)
Glucose-Capillary: 195 mg/dL — ABNORMAL HIGH (ref 70–99)
Glucose-Capillary: 148 mg/dL — ABNORMAL HIGH (ref 70–99)
Glucose-Capillary: 129 mg/dL — ABNORMAL HIGH (ref 70–99)
Glucose-Capillary: 148 mg/dL — ABNORMAL HIGH (ref 70–99)

## 2022-05-15 MED ORDER — SUCRALFATE 1 G PO TABS: 1.0000 g | ORAL_TABLET | Freq: Three times a day (TID) | ORAL | 0 refills | Status: DC

## 2022-05-15 MED ORDER — PANTOPRAZOLE SODIUM 40 MG PO TBEC
DELAYED_RELEASE_TABLET | ORAL | 0 refills | Status: DC
  Filled 2022-05-15: qty 60, 30d supply, fill #0

## 2022-05-15 MED ORDER — PROCHLORPERAZINE MALEATE 10 MG PO TABS: 10.0000 mg | ORAL_TABLET | Freq: Four times a day (QID) | ORAL | Status: DC | PRN

## 2022-05-15 MED ORDER — CLOPIDOGREL BISULFATE 75 MG PO TABS
75.0000 mg | ORAL_TABLET | Freq: Every day | ORAL | Status: DC
  Administered 2022-05-15: 75 mg via ORAL
  Filled 2022-05-15: qty 1

## 2022-05-15 MED ORDER — ALUM & MAG HYDROXIDE-SIMETH 200-200-20 MG/5ML PO SUSP: 15.0000 mL | ORAL | Status: DC | PRN

## 2022-05-15 MED ORDER — GABAPENTIN 400 MG PO CAPS: 800.0000 mg | ORAL_CAPSULE | Freq: Every evening | ORAL | Status: DC | PRN

## 2022-05-15 NOTE — TOC Progression Note (Signed)
Transition of Care [facility]) - Progression Note    Patient Details  Name: Antonio Guerra MRN: [id number] Date of Birth: March 18, 1951  [facility]) CM/SW Contact  Purcell Mouton, RN Phone Number: 05/15/2022, 2:19 PM  Clinical Narrative:     Albin Felling, CSW with [facility] asked that discharge summary be faxed to [facility] [phone]. This is completed.    Expected Discharge Plan: Home/Self Care Barriers to Discharge: Continued Medical Work up  Expected Discharge Plan and Services Expected Discharge Plan: Home/Self Care   Discharge Planning Services: CM Consult   Living arrangements for the past 2 months: Apartment Expected Discharge Date: 05/15/22                                     Social Determinants of Health (SDOH) Interventions    Readmission Risk Interventions     No data to display

## 2022-05-15 NOTE — Discharge Summary (Signed)
Physician Discharge Summary  Antonio Guerra NOB:[id number] DOB: [birth year] DOA: 05/11/2022  PCP: [name], MD  Admit date: 05/11/2022 Discharge date: 05/15/2022  Time spent: 60 minutes  Recommendations for Outpatient Follow-up:  Follow-up with Dr. Tarri Glenn gastroenterology in 2 to 3 weeks.  On follow-up biopsies done on EGD will need to be followed up upon as well as LA grade D esophagitis. Follow-up with [name], MD in 2 weeks.  On follow-up patient need a basic metabolic profile done to follow-up on electrolytes and renal function.   Discharge Diagnoses:  Principal Problem:   AKI (acute kidney injury) ([place]) Active Problems:   Esophagitis, [place] grade D   Hyperlipidemia   Coronary artery disease   Chronic systolic congestive heart failure (HCC)   Type 2 diabetes mellitus (HCC)   Epigastric pain   Stage 3a chronic kidney disease (HCC)   Merkel cell carcinoma (HCC)   Acute kidney injury superimposed on chronic kidney disease (HCC)   Nausea and vomiting   Dehydration   Acute esophagitis   Gastritis without bleeding   Abnormal CT scan, colon   Discharge Condition: Stable and improved.  Diet recommendation: Soft diet and advance as tolerated to a regular diet.  Filed Weights   05/13/22 0500 05/14/22 0500 05/15/22 0500  Weight: 79.5 kg 77.9 kg 79.4 kg    History of present illness:  HPI per Dr Wyn Forster is a 71 y.o. 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  Associated with nausea and vomiting.  No reported subjective fevers.  Nausea and vomiting not improved with home Zofran.  Work-up in the ED revealed no evidence of obstruction on CT scan.  CT scan showed stable right pelvic and right  inguinal lymphadenopathy with a stable 7.3 cm x 7.5 cm necrotic right inguinal mass.  Mild, symmetric anterior and right lateral rectal wall thickening associated with an underlying neoplastic process cannot be excluded.  Correlation with colonoscopy is recommended.  Lab studies were remarkable for worsening renal insufficiency.  The patient received normal saline IV fluid bolus 500 cc x 1 and was started on maintenance IV fluid LR at 75 cc/h.  TRH, hospitalist service was asked to admit for AKI.   ED Course: Tmax 99.1.  BP 132/72, pulse 107, respiratory 17, O2 saturation 96% on room air.  Lab studies remarkable for serum glucose 168, BUN 35, creatinine 2.66 from 1.6 at baseline, AST 54, ALT 68, T. bili 1.3.  GFR 25.  Hemoglobin 11.9.  Hospital Course:  #1 acute kidney injury superimposed on CKD stage IIIb -Likely secondary to prerenal azotemia in the setting of dehydration from nausea and vomiting, poor oral intake over the past 3 weeks per daughter. -Creatinine on admission noted at 2.6.  Baseline creatinine 1.6. -Urinalysis, nitrite negative, leukocytes negative, greater than 300 protein, specific gravity 1.032. -CT abdomen and pelvis done on admission negative for hydronephrosis or obstruction renal calculi. -Renal function improved with gentle hydration and acute kidney injury had resolved by day of discharge such that by day of discharge creatinine was down to 0.99.   -Outpatient follow-up with PCP.   2.  Nausea/vomiting/abdominal pain in the setting of malignancy/history of gastritis/Merkel cell carcinoma currently being treated with Keytruda likely secondary to severe erosive esophagitis/LA grade D esophagitis,  severe gastritis -Patient improved clinically since admission with IV fluids, IV antiemetics, IV PPI.   -CT abdomen and pelvis done with mild symmetric rectal wall thickening, cannot exclude neoplastic process, right inguinal mass unchanged. -Oncology, Dr. Chryl Heck consulted and and  formal consultation pending. -Patient seen in consultation by GI and concerned that etiology of patient's symptoms of abdominal pain nausea and vomiting may be secondary to [place], peptic ulcer disease versus gastropathy versus progression of disease versus gastroparesis. -Patient status post EGD and flex sigmoidoscopy on 05/13/2022 with LA grade D severe erosive esophagitis noted, severe gastritis noted.  Flexible sigmoidoscopy with no proctitis, no mass seen in the examined portion of the rectum as difficult visualization due to stool in the rectum.  Biopsies were obtained and pending at time of discharge. -GI recommended PPI twice daily x8 weeks then daily in addition to Carafate suspension 3 times daily before meals and at bedtime x1 week.  -Patient initially started on clear liquid diet, diet advanced to full liquid diet which he tolerated and subsequently has soft diet on the morning of discharge which he tolerated.   -Patient was followed by GI who subsequently signed off.   -Patient was discharged home in stable and improved condition with outpatient follow-up with primary gastroenterologist, Dr. Tarri Glenn.    3.  Dehydration -Hydrated with IV fluids and was euvolemic by day of discharge.     4.  Merkel cell carcinoma currently treated with Keytruda -CT abdomen and pelvis showing stable right pelvic and right inguinal lymphadenopathy with stable 7.3 cm x 7.5 cm necrotic right inguinal mass.  Mild symmetric anterior and right lateral rectal wall thickening associated underlying neoplastic process cannot be excluded. -Patient for flexible sigmoidoscopy difficult to visualize with stool in the rectum but examined portion of rectum was normal, no proctitis or mass seen.  -Outpatient follow-up with primary oncologist as previously scheduled.   5.  Transaminitis -Likely side effect from [facility]. -LFTs trended down. -Outpatient follow-up.   6.  Chronic systolic CHF/CAD -Patient noted to be  hypovolemic on examination on presentation.. -Last 2D echo from 04/09/2022 with EF of 30 to 35%, strict I's and O's, daily weights. -Patient gently hydrated IV fluids and euvolemic by day of discharge. -Patient maintained on home regimen Coreg and isosorbide during the hospitalization. -Outpatient follow-up.   7.  Type 2 diabetes with hyperglycemia -Hemoglobin A1c 5.3 (05/12/2022) -Patient maintained on sliding scale insulin during the hospitalization.   -Home regimen Trulicity was held and will be resumed on discharge.     8.  Generalized weakness -PT/ OT assessed the patient and recommended SNF however family refused SNF and patient be discharged home.  [facility] follows with [facility].    Procedures: CT abdomen and pelvis 05/11/2022 Upper endoscopy 05/13/2022: Per Dr. Hilarie Fredrickson Flexible sigmoidoscopy 05/13/2022 per Dr. Hilarie Fredrickson    Consultations: Gastroenterology: Dr. Hilarie Fredrickson 05/12/2022 Wound care RN, Phineas Douglas, RN 05/12/2022  Discharge Exam: Vitals:   05/15/22 0906 05/15/22 1206  BP: (!) 176/90 97/70  Pulse: 93 (!) 107  Resp:    Temp:  98.4 F (36.9 C)  SpO2:  100%    General: NAD Cardiovascular: RRR no m/r/g Respiratory: CTAB. No wheezes, no crackle, no rhonchi  Discharge Instructions   Discharge Instructions     Diet general   Complete by: As directed    Soft diet, advance as tolerated to a regular diet   Discharge wound care:   Complete by: As directed    Wound care to stage 1 pressure injury  to left buttock, POA. Cleanse with NS, pat dry. Cover with silicone foam dressing. Lift to assess skin each shift. Change silicone foam every 3 days and PRN for soiling.   Increase activity slowly   Complete by: As directed       Allergies as of 05/15/2022       Reactions   Lisinopril Other (See Comments)   Caused pt to have chest pains.    Tape Itching, Rash        Medication List     STOP taking these medications    Lantus SoloStar 100 UNIT/ML Solostar  Pen Generic drug: insulin glargine   traMADol 50 MG tablet Commonly known as: ULTRAM       TAKE these medications    acetaminophen 650 MG CR tablet Commonly known as: TYLENOL Take 650 mg by mouth every 8 (eight) hours as needed for pain.   alum & mag hydroxide-simeth 200-200-20 MG/5ML suspension Commonly known as: MAALOX/MYLANTA Take 15 mLs by mouth every 4 (four) hours as needed for indigestion or heartburn.   atorvastatin 80 MG tablet Commonly known as: LIPITOR TAKE 1 TABLET(80 MG) BY MOUTH AT BEDTIME What changed: See the new instructions.   BD Pen Needle Nano U/F 32G X 4 MM Misc Generic drug: Insulin Pen Needle AS DIRECTED TWICE DAILY   carvedilol 12.5 MG tablet Commonly known as: COREG Take 12.5 mg by mouth 2 (two) times daily with a meal.   clopidogrel 75 MG tablet Commonly known as: PLAVIX TAKE 1 TABLET(75 MG) BY MOUTH DAILY What changed: See the new instructions.   cycloSPORINE 0.05 % ophthalmic emulsion Commonly known as: RESTASIS Place 1 drop into both eyes 2 (two) times daily.   Depend Adjustable Underwear Lg Misc 1 application by Does not apply route as needed.   dexamethasone 4 MG tablet Commonly known as: DECADRON Take 2 tablets daily for 2 days, on days 4 and 5.Take with food. Every 21 days.   FreeStyle Libre 14 Day Reader Devi 1 Device by Does not apply route every morning.   gabapentin 400 MG capsule Commonly known as: NEURONTIN TAKE 1 CAPSULE BY MOUTH TWICE DAILY What changed: Another medication with the same name was removed. Continue taking this medication, and follow the directions you see here.   isosorbide mononitrate 10 MG tablet Commonly known as: ISMO Take 10 mg by mouth daily.   metoCLOPramide 10 MG tablet Commonly known as: REGLAN Take 1 tablet (10 mg total) by mouth every 6 (six) hours as needed for up to 20 days for nausea.   nitroGLYCERIN 0.4 MG SL tablet Commonly known as: NITROSTAT PLACE 1 TABLET UNDER THE TONGUE EVERY  5 MINS AS NEEDED FOR CHEST PAIN What changed: See the new instructions.   ondansetron 4 MG tablet Commonly known as: ZOFRAN Take 4 mg by mouth every 4 (four) hours as needed for nausea or vomiting. What changed: Another medication with the same name was added. Make sure you understand how and when to take each.   ondansetron 8 MG tablet Commonly known as: Zofran Take 1 tablet (8 mg total) by mouth every 8 (eight) hours as needed for nausea or vomiting. Start on third day after chemotherapy. What changed: You were already taking a medication with the same name, and this prescription was added. Make sure you understand how and when to take each.   onetouch ultrasoft lancets USE AS DIRECTED   OneTouch Verio test strip Generic drug: glucose blood USE TO TEST BLOOD SUGAR 2  TO 3 TIMES A DAY   OneTouch Verio w/Device Kit 30 Units by Does not apply route daily.   pantoprazole 40 MG tablet Commonly known as: Protonix Take 1 tablet (40 mg total) by mouth 2 (two) times daily for 56 days, THEN 1 tablet (40 mg total) daily. Start taking on: May 15, 2022 What changed:  medication strength See the new instructions.   prochlorperazine 10 MG tablet Commonly known as: COMPAZINE Take 1 tablet (10 mg total) by mouth every 6 (six) hours as needed for nausea or vomiting (Nausea or vomiting).   Salonpas Pain Relieving 4 % Generic drug: lidocaine Place 1 patch onto the skin daily as needed (pain).   sucralfate 1 g tablet Commonly known as: Carafate Take 1 tablet (1 g total) by mouth 4 (four) times daily -  with meals and at bedtime for 7 days. May use longer if needed. What changed: additional instructions   Trulicity 3.23 FT/7.3UK Sopn Generic drug: Dulaglutide Inject 0.75 mg into the skin once a week. Friday or saturday               Discharge Care Instructions  (From admission, onward)           Start     Ordered   05/15/22 0000  Discharge wound care:       Comments:  Wound care to stage 1 pressure injury to left buttock, POA. Cleanse with NS, pat dry. Cover with silicone foam dressing. Lift to assess skin each shift. Change silicone foam every 3 days and PRN for soiling.   05/15/22 1301           Allergies  Allergen Reactions   Lisinopril Other (See Comments)    Caused pt to have chest pains.    Tape Itching and Rash    Follow-up Information     [name], MD. Schedule an appointment as soon as possible for a visit in 2 week(s).   Specialty: Family Medicine Contact information: [place] [address] [phone]         Thornton Park, MD. Schedule an appointment as soon as possible for a visit in 2 week(s).   Specialty: Gastroenterology Why: Follow-up in 2 to 3 weeks. Contact information: [address] [place] [address] [phone]                  The results of significant diagnostics from this hospitalization (including imaging, microbiology, ancillary and laboratory) are listed below for reference.    Significant Diagnostic Studies: CT ABDOMEN PELVIS WO CONTRAST  Result Date: 05/11/2022 CLINICAL DATA:  Abdominal pain with nausea and vomiting. EXAM: CT ABDOMEN AND PELVIS WITHOUT CONTRAST TECHNIQUE: Multidetector CT imaging of the abdomen and pelvis was performed following the standard protocol without IV contrast. RADIATION DOSE REDUCTION: This exam was performed according to the departmental dose-optimization program which includes automated exposure control, adjustment of the mA and/or kV according to patient size and/or use of iterative reconstruction technique. COMPARISON:  May 04, 2022 FINDINGS: Lower chest: Mild atelectatic changes are seen within the right middle lobe and bilateral lung bases. Hepatobiliary: No focal liver abnormality is seen. No gallstones, gallbladder wall thickening, or biliary dilatation. Pancreas: Numerous tiny parenchymal calcifications are seen scattered  throughout the pancreas. There is no evidence of pancreatic ductal dilatation or surrounding inflammatory fat stranding. Spleen: Normal in size without focal abnormality. Adrenals/Urinary Tract: Adrenal glands are unremarkable. Kidneys are normal in size, without obstructing renal calculi or hydronephrosis.  2 mm nonobstructing renal calculi are seen within both kidneys. Mild focal cortical scarring is seen along the posterolateral aspect of the mid to lower left kidney. Bladder is unremarkable. Stomach/Bowel: There is a small, stable hiatal hernia. Appendix appears normal. Stool is seen throughout the large bowel. No evidence of bowel dilatation. Mild, asymmetric anterior and right lateral rectal wall thickening is seen (axial CT images 86 through 88, CT series 2). Vascular/Lymphatic: Aortic atherosclerosis. Multiple stable enlarged right pelvic and right inguinal lymph nodes are seen (the largest measuring approximately 3.3 cm x 2.8 cm), with a stable 7.3 cm x 7.5 cm necrotic right inguinal mass. Reproductive: The prostate gland is mildly enlarged and unchanged in size. Other: A stable 16 mm x 11 mm fat containing umbilical hernia is seen. No abdominopelvic ascites. Musculoskeletal: No acute or significant osseous findings. IMPRESSION: 1. Mild, asymmetric anterior and right lateral rectal wall thickening, as described above. While this may be, in part, secondary to underdistention, sequelae associated with an underlying neoplastic process cannot be excluded. Correlation with colonoscopy is recommended. 2. Stable right pelvic and right inguinal lymphadenopathy with a stable 7.3 cm x 7.5 cm necrotic right inguinal mass. 3. Small, stable hiatal hernia. 4. Bilateral 2 mm nonobstructing renal calculi. 5. Aortic atherosclerosis. Aortic Atherosclerosis (ICD10-I70.0). Electronically Signed   By: Virgina Norfolk M.D.   On: 05/11/2022 21:16   NM PET Image Restag (PS) Skull Base To Thigh  Result Date:  05/10/2022 CLINICAL DATA:  Subsequent treatment strategy for history of Merkel cell, assess response to therapy. EXAM: NUCLEAR MEDICINE PET SKULL BASE TO THIGH TECHNIQUE: 9.2 mCi F-18 FDG was injected intravenously. Full-ring PET imaging was performed from the skull base to thigh after the radiotracer. CT data was obtained and used for attenuation correction and anatomic localization. Fasting blood glucose: 123 mg/dl COMPARISON:  Previous PET imaging from February 20, 2022. FINDINGS: Mediastinal blood pool activity: SUV max 2.86 Liver activity: SUV max NA NECK: No hypermetabolic lymph nodes in the neck. Incidental CT findings: None. CHEST: No hypermetabolic mediastinal or hilar nodes. No suspicious pulmonary nodules on the CT scan. Incidental CT findings: Extensive coronary artery disease most notably on the LEFT. No pericardial effusion. Aortic atherosclerosis. No adenopathy by size criteria in the chest. Basilar atelectasis. Airways are patent. ABDOMEN/PELVIS: Increasing size of RIGHT pelvic adenopathy and RIGHT groin mass. New RIGHT pelvic sidewall adenopathy. Mass in the RIGHT groin measuring 7.6 x 7.7 cm previously 7.6 x 6.8 cm now within maximum SUV of 14.65 previously 22.08. Some signs of central necrosis. Adjacent lymph nodes also with increase in size and comparison references are made to the August 4 PET examination. Nodes are stable compared to recent imaging from October of 2023. Similarly hypermetabolic lymph nodes which are newly enlarged (image 184/4) 14 mm as compared to 10 mm size. New RIGHT pelvic sidewall adenopathy (image 180/4) 22 mm short axis showing a maximum SUV of 14.50. External iliac adenopathy enlarged since previous imaging 23 mm (image 183/4) previously 12 mm maximum SUV on today's study of 7.45 as compared to 6.8. Large RIGHT external iliac lymph node (image 177/4) 2.7 as compared to 2.2 cm greatest axial dimension similar FDG uptake as seen similarly FDG avid to the adjacent RIGHT  external iliac lymph node. Previously at 10.09. Enlarging RIGHT common iliac lymph node (image 155/4) 16 mm short axis previously 5 mm showing a maximum SUV of 10.2 as compared to 4.96. Similar enlargement and increase in metabolic activity associated with an adjacent RIGHT internal-external  iliac bifurcation node 163/4. No lower abdominal retroperitoneal adenopathy or solid organ disease. Incidental CT findings: Aortic atherosclerosis. No acute findings in the abdomen. Decreased distension of small bowel since previous imaging. SKELETON: No focal hypermetabolic activity to suggest skeletal metastasis. Small focus of soft tissue density in the LEFT lower quadrant in the inguinal region associated with the dermis measures 14 mm previously approximately 14 mm with minimal FDG uptake which is not substantially changed. Incidental CT findings: None. IMPRESSION: 1. Increasing size and increase in size of RIGHT pelvic adenopathy and RIGHT groin mass. Dominant mass does show measurable interval decrease in FDG uptake and some central necrosis. 2. New RIGHT pelvic sidewall adenopathy with increasing size of and increasing metabolic activity associated with RIGHT common and upper external iliac adenopathy at the internal-external bifurcation. 3. Increasing size of an hypermetabolic changes related to lymph nodes adjacent to the dominant RIGHT groin mass. 4. No hypermetabolic abdominal retroperitoneal adenopathy or other evidence of distant metastatic disease. 5. Skin lesion in the LEFT lower quadrant of uncertain significance is unchanged. 6. Decreased distension of small bowel since previous imaging. 7. Extensive coronary artery disease most notably on the LEFT. 8. Aortic atherosclerosis. Aortic Atherosclerosis (ICD10-I70.0). Electronically Signed   By: Zetta Bills M.D.   On: 05/10/2022 19:50   CT ABDOMEN PELVIS W CONTRAST  Result Date: 05/04/2022 CLINICAL DATA:  NAUSEA, VOMITING. ABDOMINAL PAIN SINCE LAST  CHEMOTHERAPY 2 WEEKS AGO. EXAM: CT ABDOMEN AND PELVIS WITH CONTRAST TECHNIQUE: Multidetector CT imaging of the abdomen and pelvis was performed using the standard protocol following bolus administration of intravenous contrast. RADIATION DOSE REDUCTION: This exam was performed according to the departmental dose-optimization program which includes automated exposure control, adjustment of the mA and/or kV according to patient size and/or use of iterative reconstruction technique. CONTRAST:  53m OMNIPAQUE IOHEXOL 300 MG/ML  SOLN COMPARISON:  None Available. FINDINGS: Lower chest: No acute abnormality.  Bibasilar dependent atelectasis. Hepatobiliary: No focal liver abnormality is seen. No gallstones, gallbladder wall thickening, or biliary dilatation. Pancreas: Mild pancreatic atrophy. No pancreatic ductal dilatation or surrounding inflammatory changes. Spleen: Normal in size without focal abnormality. Adrenals/Urinary Tract: Adrenal glands are unremarkable. Kidneys are normal, without renal calculi, focal lesion, or hydronephrosis. Small hypodense cysts in the lower pole of the left kidney. Bladder is unremarkable. Stomach/Bowel: Small hiatal hernia. Stomach is within normal limits. Appendix appears normal. There are multiple fluid-filled distended small bowel loops without evidence of obstruction, similar to prior examination. Vascular/Lymphatic: Aortic atherosclerosis. Right inguinal region necrotic mass measuring approximately 7.5 x 7.0 cm, not significantly changed. Another right inguinal lymph node along the right external iliac vessels measuring approximately 3.0 x 2.5 cm, also not significantly changed. Reproductive: Mildly enlarged prostate. Other: No abdominal wall hernia or abnormality. No abdominopelvic ascites. Musculoskeletal: No acute or significant osseous findings. IMPRESSION: 1. Multiple fluid-filled distended small bowel loops without evidence of obstruction, similar to prior examination, likely  suggesting ileus. 2. Right inguinal region necrotic mass measuring approximately 7.5 x 7.0 cm, not significantly changed. Another right inguinal lymph node along the right external iliac vessels measuring approximately 3.0 x 2.5 cm, also not significantly changed. 3. Small hiatal hernia. 4. Aortic atherosclerosis. 5. Mildly enlarged prostate. Electronically Signed   By: IKeane PoliceD.O.   On: 05/04/2022 12:37   CT ABDOMEN PELVIS W CONTRAST  Result Date: 04/26/2022 CLINICAL DATA:  Epigastric pain. Pt is being treated for cancer, abd pain .vomiting 3 days. Merkel cell carcinoma. EXAM: CT ABDOMEN AND PELVIS WITH CONTRAST TECHNIQUE:  Multidetector CT imaging of the abdomen and pelvis was performed using the standard protocol following bolus administration of intravenous contrast. RADIATION DOSE REDUCTION: This exam was performed according to the departmental dose-optimization program which includes automated exposure control, adjustment of the mA and/or kV according to patient size and/or use of iterative reconstruction technique. CONTRAST:  <See Chart> OMNIPAQUE IOHEXOL 300 MG/ML SOLN, 34m OMNIPAQUE IOHEXOL 300 MG/ML SOLN COMPARISON:  Head CT 02/20/2022 FINDINGS: Lower chest: Limited evaluation due to respiratory motion artifact. Question peribronchovascular ground-glass airspace opacities that are poorly visualized. Patulous distal esophagus with distal circumferential esophageal wall thickening. Hepatobiliary: No focal liver abnormality. No gallstones, gallbladder wall thickening, or pericholecystic fluid. No biliary dilatation. Pancreas: No focal lesion. Normal pancreatic contour. No surrounding inflammatory changes. No main pancreatic ductal dilatation. Spleen: Normal in size without focal abnormality. Adrenals/Urinary Tract: No adrenal nodule bilaterally. Bilateral kidneys enhance symmetrically. There is a 1.2 cm left inferior renal pole complex cystic lesion. No hydronephrosis. No hydroureter. The urinary  bladder is unremarkable. On delayed imaging, there is no urothelial wall thickening and there are no filling defects in the opacified portions of the bilateral collecting systems or ureters. Stomach/Bowel: There is gastric wall thickening along the gastroesophageal junction. Several loops of small bowel are distended with fluid. No pneumatosis. No evidence of bowel wall thickening or dilatation. Appendix appears normal. Vascular/Lymphatic: No abdominal aorta or iliac aneurysm. Severe atherosclerotic plaque of the aorta and its branches. Interval increase in size of a 7.6 x 7.7 cm centrally necrotic right inguinal mass (2:93). Interval increase in size of a right [place] lymph node measuring 3.7 x 3.1 cm (from 2.2 x 3.1 cm). Interval increase in size of right iliac lymph nodes with as an example a 1.8 cm (from 0.7 cm) lymph node. Reproductive: The prostate is enlarged measuring up to 5 cm. Other: No intraperitoneal free fluid. No intraperitoneal free gas. No organized fluid collection. Musculoskeletal: No abdominal wall hernia or abnormality. No suspicious lytic or blastic osseous lesions. No acute displaced fracture. Multilevel degenerative changes of the spine. IMPRESSION: 1. Question peribronchovascular ground-glass airspace opacities that are poorly visualized due to respiratory motion artifact. 2. Distal circumferential esophageal wall thickening as well as gastroesophageal junction wall thickening. Recommend direct visualization/biopsy for further evaluation. 3. Interval increase in size of right pelvic and inguinal lymphadenopathy in a patient with known malignancy. 4. A complex 1.2 cm left inferior renal pole lesion is incompletely evaluated on this study. When the patient is clinically stable and able to follow directions and hold their breath (preferably as an outpatient) further evaluation with dedicated nonemergent abdominal MRI renal protocol should be considered. 5.  Aortic Atherosclerosis  (ICD10-I70.0). Electronically Signed   By: MIven FinnM.D.   On: 04/26/2022 21:17   DG Chest 2 View  Result Date: 04/26/2022 CLINICAL DATA:  Vomiting. Chemotherapy reaction with vomiting for 3 days. History of skin cancer. EXAM: CHEST - 2 VIEW COMPARISON:  03/05/2020 FINDINGS: The heart size and mediastinal contours are within normal limits. Both lungs are clear. The visualized skeletal structures are unremarkable. IMPRESSION: No active cardiopulmonary disease. Electronically Signed   By: WLucienne CapersM.D.   On: 04/26/2022 19:09   MYOCARDIAL PERFUSION IMAGING  Result Date: 04/21/2022   Findings are consistent with no prior ischemia and no prior myocardial infarction. The study is high risk. Based on estimated EF   No ST deviation was noted.   There is no evidence of ischemia. There is no evidence of infarction.   Left ventricular  function is abnormal. Global function is severely reduced. Nuclear stress EF: 34 %. The left ventricular ejection fraction is moderately decreased (30-44%). End diastolic cavity size is normal. End systolic cavity size is mildly enlarged.   Prior study not available for comparison. Normal perfusion Global hypokinesis abnormal septal motion estimated EF 34% Suggest echo/MRI correlation    Microbiology: No results found for this or any previous visit (from the past 240 hour(s)).   Labs: Basic Metabolic Panel: Recent Labs  Lab 05/11/22 1922 05/12/22 0459 05/13/22 0345 05/14/22 0346 05/15/22 0351  NA 137 140 139 136 136  K 4.3 3.8 3.9 3.7 3.9  CL 99 103 104 105 106  CO2 _0 GLUCOSE 168* 128* 101* 109* 106*  BUN 35* 30* _1 CREATININE 2.66* 2.06* 1.31* 1.12 0.99  CALCIUM 9.4 8.7* 8.6* 8.6* 8.8*  MG  --  2.0  --  1.7 2.0  PHOS  --  4.6  --   --   --    Liver Function Tests: Recent Labs  Lab 05/11/22 1922 05/12/22 0459 05/13/22 0345 05/14/22 0346  AST 54* 34 26 23  ALT 68* 50* 35 28  ALKPHOS 89 71 63 70  BILITOT 1.3* 1.0 1.0 0.8   PROT 8.4* 6.9 6.3* 6.3*  ALBUMIN 3.9 3.2* 2.9* 3.0*   Recent Labs  Lab 05/11/22 1922  LIPASE 45   No results for input(s): "AMMONIA" in the last 168 hours. CBC: Recent Labs  Lab 05/11/22 1922 05/12/22 0459 05/13/22 0345 05/14/22 0346 05/15/22 0351  WBC 6.6 7.6 4.5 4.1 3.7*  NEUTROABS 5.0 5.3  --  2.4 2.0  HGB 11.9* 9.9* 9.7* 9.3* 9.4*  HCT 37.3* 30.4* 29.8* 27.5* 28.6*  MCV 86.7 85.6 84.9 83.6 84.4  PLT 298 245 248 232 233   Cardiac Enzymes: No results for input(s): "CKTOTAL", "CKMB", "CKMBINDEX", "TROPONINI" in the last 168 hours. BNP: BNP (last 3 results) Recent Labs    04/26/22 1900  BNP 42.9    ProBNP (last 3 results) No results for input(s): "PROBNP" in the last 8760 hours.  CBG: Recent Labs  Lab 05/14/22 1634 05/14/22 1949 05/14/22 2147 05/15/22 0746 05/15/22 1202  GLUCAP 112* 195* 148* 129* 148*       Signed:  Irine Seal MD.  [facility] 05/15/2022, 2:14 PM

## 2022-05-15 NOTE — TOC Progression Note (Addendum)
Transition of Care [facility]) - Progression Note    Patient Details  Name: Posey Petrik MRN: [id number] Date of Birth: [birth year]  Transition of [facility] CM/SW Contact  Purcell Mouton, RN Phone Number: 05/15/2022, 10:37 AM  Clinical Narrative:     Pt's daughter Laney Pastor calling to see if pt will discharge today. Spoke with pt concerning SNF. Pt decline going to SNF and ask to go home. Pt is active with [facility].    Expected Discharge Plan: Home/Self Care Barriers to Discharge: Continued Medical Work up  Expected Discharge Plan and Services Expected Discharge Plan: Home/Self Care   Discharge Planning Services: CM Consult   Living arrangements for the past 2 months: Apartment                                       Social Determinants of Health (SDOH) Interventions    Readmission Risk Interventions     No data to display

## 2022-05-15 NOTE — Plan of Care (Signed)
  Problem: Education: Goal: Knowledge of General Education information will improve Description Including pain rating scale, medication(s)/side effects and non-pharmacologic comfort measures Outcome: Progressing   Problem: Health Behavior/Discharge Planning: Goal: Ability to manage health-related needs will improve Outcome: Progressing   

## 2022-05-15 NOTE — Progress Notes (Signed)
Physical Therapy Treatment Patient Details Name: Antonio Guerra MRN: [id number] DOB: 1951/06/03 Today's Date: 05/15/2022   History of Present Illness Patient is 71 y.o. male with a history of merkel cell carcinoma currently being treated with [place] presents with abdominal pain and vomiting. He is also having a lot of pain in his right groin where he has the very enlarged lymph node.    PT Comments    Pt motivated, able to come to EOB and transfer with mod A+1, increased time and cues for sequencing and muscle activation. Pt needing mod A to power to stand from elevated bed, hip extension noted last, cues for upright posture once standing, able to complete x3 reps. Pt able to take a few steps over to recliner, minimally clearing feet, increased time and cues with RW. Pt complete STS reps from recliner, cues for hand placement, mod A to power up to standing and shift weight anterior. Pt tolerates LE strengthening exercises with cues for motor control. Pt c/o dizziness after 3 STS reps from bed and 2 STS reps from recliner, BP WNL- RN notified.   Recommendations for follow up therapy are one component of a multi-disciplinary discharge planning process, led by the attending physician.  Recommendations may be updated based on patient status, additional functional criteria and insurance authorization.  Follow Up Recommendations  Skilled nursing-short term rehab (<3 hours/day) Can patient physically be transported by private vehicle: Yes   Assistance Recommended at Discharge Frequent or constant Supervision/Assistance  Patient can return home with the following A lot of help with walking and/or transfers;A lot of help with bathing/dressing/bathroom;Assistance with cooking/housework;Assist for transportation;Help with stairs or ramp for entrance   Equipment Recommendations  Other (comment) (TBA)    Recommendations for Other Services       Precautions / Restrictions Precautions Precautions:  Fall Precaution Comments: 2 falls in last 6 months Restrictions Weight Bearing Restrictions: No     Mobility  Bed Mobility Overal bed mobility: Needs Assistance Bed Mobility: Supine to Sit  Supine to sit: Mod assist, HOB elevated  General bed mobility comments: pt inches BLE over to EOB, using bedrails to assist trunk, ultimately needing mod A to come to sitting EOB and scoot forward with use of bedpad    Transfers Overall transfer level: Needs assistance Equipment used: Rolling walker (2 wheels) Transfers: Sit to/from Stand Sit to Stand: Mod assist  General transfer comment: cues for hand placement and quad/glute activation to power up to standing, mod A from elevated bed and from low seated recliner, hip extension last    Ambulation/Gait Ambulation/Gait assistance: Min assist  Assistive device: Rolling walker (2 wheels) Gait Pattern/deviations: Step-to pattern, Decreased stride length Gait velocity: decreased  General Gait Details: slow steps at EOB, clearing feet minimally, reliant on RW for steadying and min A, limited by weakness and dizziness complaints   Stairs             Wheelchair Mobility    Modified Rankin (Stroke Patients Only)       Balance Overall balance assessment: Needs assistance Sitting-balance support: Feet supported Sitting balance-Leahy Scale: Fair  Standing balance support: During functional activity, Reliant on assistive device for balance, Bilateral upper extremity supported Standing balance-Leahy Scale: Poor     Cognition Arousal/Alertness: Awake/alert Behavior During Therapy: Flat affect Overall Cognitive Status: No family/caregiver present to determine baseline cognitive functioning  General Comments: Pt pleasant, able to follow commands, minimally conversational        Exercises General Exercises - Lower Extremity  Long Arc Quad: Seated, AROM, Strengthening, Both, 10 reps Hip Flexion/Marching: Seated, AROM, Strengthening,  Both, 10 reps Other Exercises Other Exercises: standing marching 20 reps, 2 sets    General Comments        Pertinent Vitals/Pain Pain Assessment Pain Assessment: Faces Faces Pain Scale: Hurts little more Pain Location: RLE and back Pain Descriptors / Indicators: Grimacing, Guarding, Discomfort Pain Intervention(s): Limited activity within patient's tolerance, Monitored during session, Repositioned    Home Living                          Prior Function            PT Goals (current goals can now be found in the care plan section) Acute Rehab PT Goals Patient Stated Goal: stop hurting in Rt leg PT Goal Formulation: With patient Time For Goal Achievement: 05/26/22 Potential to Achieve Goals: Fair Progress towards PT goals: Progressing toward goals    Frequency    Min 2X/week      PT Plan      Co-evaluation              AM-PAC PT "6 Clicks" Mobility   Outcome Measure  Help needed turning from your back to your side while in a flat bed without using bedrails?: A Lot Help needed moving from lying on your back to sitting on the side of a flat bed without using bedrails?: A Lot Help needed moving to and from a bed to a chair (including a wheelchair)?: A Lot Help needed standing up from a chair using your arms (e.g., wheelchair or bedside chair)?: A Lot Help needed to walk in hospital room?: A Lot Help needed climbing 3-5 steps with a railing? : Total 6 Click Score: 11    End of Session Equipment Utilized During Treatment: Gait belt Activity Tolerance: Patient tolerated treatment well;Other (comment) (dizziness complaints) Patient left: in chair;with call bell/phone within reach (chair alarm in chair, box won't turn on- RN aware) Nurse Communication: Mobility status PT Visit Diagnosis: Other abnormalities of gait and mobility (R26.89);Muscle weakness (generalized) (M62.81);Difficulty in walking, not elsewhere classified (R26.2);Pain Pain -  Right/Left: Right Pain - part of body: Leg     Time: 5573-2202 PT Time Calculation (min) (ACUTE ONLY): 37 min  Charges:  $Therapeutic Exercise: 8-22 mins $Therapeutic Activity: 8-22 mins                      Tori Arisbel Maione PT, DPT 05/15/22, 1:08 PM

## 2022-05-16 ENCOUNTER — Encounter: Payer: Self-pay | Admitting: Hematology and Oncology

## 2022-05-16 NOTE — Progress Notes (Signed)
Late entry for 10/25.  I saw the patient and discussed the imaging results. Reviewed PET results with daughter as well in detail. Patient agreed to try low dose chemotherapy, hence ordered carbo etoposide. Discussed this is chemo chemo, not like the [place] he was getting. He understands the adverse effects can be life threatening. If he happens to tolerate it poorly, then we discussed about palliative care and keeping him comfortable. They are all agreeable to this plan. He is scheduled to see me in a couple days.

## 2022-05-18 ENCOUNTER — Ambulatory Visit: Payer: Medicare (Managed Care)

## 2022-05-18 MED FILL — Fosaprepitant Dimeglumine For IV Infusion 150 MG (Base Eq): INTRAVENOUS | Qty: 5 | Status: AC

## 2022-05-18 MED FILL — Dexamethasone Sodium Phosphate Inj 100 MG/10ML: INTRAMUSCULAR | Qty: 1 | Status: AC

## 2022-05-19 ENCOUNTER — Inpatient Hospital Stay: Payer: Medicare (Managed Care)

## 2022-05-19 ENCOUNTER — Inpatient Hospital Stay (HOSPITAL_COMMUNITY): Payer: Medicare (Managed Care)

## 2022-05-19 ENCOUNTER — Other Ambulatory Visit (HOSPITAL_COMMUNITY): Payer: Self-pay

## 2022-05-19 ENCOUNTER — Inpatient Hospital Stay (HOSPITAL_BASED_OUTPATIENT_CLINIC_OR_DEPARTMENT_OTHER): Payer: Medicare (Managed Care) | Admitting: Hematology and Oncology

## 2022-05-19 ENCOUNTER — Encounter: Payer: Self-pay | Admitting: Hematology and Oncology

## 2022-05-19 ENCOUNTER — Other Ambulatory Visit: Payer: Self-pay

## 2022-05-19 ENCOUNTER — Other Ambulatory Visit: Payer: Self-pay | Admitting: *Deleted

## 2022-05-19 ENCOUNTER — Emergency Department (HOSPITAL_COMMUNITY): Payer: Medicare (Managed Care)

## 2022-05-19 ENCOUNTER — Inpatient Hospital Stay (HOSPITAL_COMMUNITY)
Admission: EM | Admit: 2022-05-19 | Discharge: 2022-06-05 | DRG: 391 | Disposition: A | Discharge status: Skilled Nursing Facility | Payer: Medicare (Managed Care) | Attending: Internal Medicine | Admitting: Internal Medicine

## 2022-05-19 ENCOUNTER — Telehealth: Payer: Self-pay | Admitting: *Deleted

## 2022-05-19 ENCOUNTER — Ambulatory Visit: Payer: Medicare (Managed Care)

## 2022-05-19 ENCOUNTER — Encounter (HOSPITAL_COMMUNITY): Payer: Self-pay

## 2022-05-19 ENCOUNTER — Inpatient Hospital Stay (HOSPITAL_BASED_OUTPATIENT_CLINIC_OR_DEPARTMENT_OTHER): Payer: Medicare (Managed Care) | Admitting: Physician Assistant

## 2022-05-19 VITALS — BP 98/64 | HR 90 | Temp 97.8°F | Resp 16

## 2022-05-19 DIAGNOSIS — E1122 Type 2 diabetes mellitus with diabetic chronic kidney disease: Secondary | ICD-10-CM | POA: Diagnosis present

## 2022-05-19 DIAGNOSIS — L8990 Pressure ulcer of unspecified site, unspecified stage: Secondary | ICD-10-CM | POA: Diagnosis present

## 2022-05-19 DIAGNOSIS — K2970 Gastritis, unspecified, without bleeding: Secondary | ICD-10-CM | POA: Diagnosis present

## 2022-05-19 DIAGNOSIS — C7B1 Secondary Merkel cell carcinoma: Secondary | ICD-10-CM

## 2022-05-19 DIAGNOSIS — E876 Hypokalemia: Secondary | ICD-10-CM | POA: Diagnosis present

## 2022-05-19 DIAGNOSIS — N1831 Chronic kidney disease, stage 3a: Secondary | ICD-10-CM | POA: Diagnosis not present

## 2022-05-19 DIAGNOSIS — C4A9 Merkel cell carcinoma, unspecified: Secondary | ICD-10-CM

## 2022-05-19 DIAGNOSIS — E860 Dehydration: Secondary | ICD-10-CM | POA: Diagnosis present

## 2022-05-19 DIAGNOSIS — R140 Abdominal distension (gaseous): Secondary | ICD-10-CM | POA: Diagnosis not present

## 2022-05-19 DIAGNOSIS — E785 Hyperlipidemia, unspecified: Secondary | ICD-10-CM | POA: Diagnosis present

## 2022-05-19 DIAGNOSIS — K2210 Ulcer of esophagus without bleeding: Secondary | ICD-10-CM | POA: Diagnosis present

## 2022-05-19 DIAGNOSIS — Z8673 Personal history of transient ischemic attack (TIA), and cerebral infarction without residual deficits: Secondary | ICD-10-CM

## 2022-05-19 DIAGNOSIS — K449 Diaphragmatic hernia without obstruction or gangrene: Secondary | ICD-10-CM | POA: Diagnosis present

## 2022-05-19 DIAGNOSIS — R933 Abnormal findings on diagnostic imaging of other parts of digestive tract: Secondary | ICD-10-CM | POA: Diagnosis not present

## 2022-05-19 DIAGNOSIS — Z79899 Other long term (current) drug therapy: Secondary | ICD-10-CM | POA: Diagnosis not present

## 2022-05-19 DIAGNOSIS — Z955 Presence of coronary angioplasty implant and graft: Secondary | ICD-10-CM | POA: Diagnosis not present

## 2022-05-19 DIAGNOSIS — G8929 Other chronic pain: Secondary | ICD-10-CM | POA: Diagnosis present

## 2022-05-19 DIAGNOSIS — K208 Other esophagitis without bleeding: Secondary | ICD-10-CM | POA: Diagnosis not present

## 2022-05-19 DIAGNOSIS — D509 Iron deficiency anemia, unspecified: Secondary | ICD-10-CM | POA: Diagnosis present

## 2022-05-19 DIAGNOSIS — N189 Chronic kidney disease, unspecified: Secondary | ICD-10-CM | POA: Diagnosis not present

## 2022-05-19 DIAGNOSIS — Z7189 Other specified counseling: Secondary | ICD-10-CM | POA: Diagnosis not present

## 2022-05-19 DIAGNOSIS — I959 Hypotension, unspecified: Secondary | ICD-10-CM | POA: Diagnosis not present

## 2022-05-19 DIAGNOSIS — I130 Hypertensive heart and chronic kidney disease with heart failure and stage 1 through stage 4 chronic kidney disease, or unspecified chronic kidney disease: Secondary | ICD-10-CM | POA: Diagnosis present

## 2022-05-19 DIAGNOSIS — Z794 Long term (current) use of insulin: Secondary | ICD-10-CM | POA: Diagnosis not present

## 2022-05-19 DIAGNOSIS — K2100 Gastro-esophageal reflux disease with esophagitis, without bleeding: Secondary | ICD-10-CM | POA: Diagnosis present

## 2022-05-19 DIAGNOSIS — Z7952 Long term (current) use of systemic steroids: Secondary | ICD-10-CM

## 2022-05-19 DIAGNOSIS — N1832 Chronic kidney disease, stage 3b: Secondary | ICD-10-CM | POA: Diagnosis present

## 2022-05-19 DIAGNOSIS — K9189 Other postprocedural complications and disorders of digestive system: Secondary | ICD-10-CM | POA: Diagnosis not present

## 2022-05-19 DIAGNOSIS — E43 Unspecified severe protein-calorie malnutrition: Secondary | ICD-10-CM | POA: Insufficient documentation

## 2022-05-19 DIAGNOSIS — Z87891 Personal history of nicotine dependence: Secondary | ICD-10-CM | POA: Diagnosis not present

## 2022-05-19 DIAGNOSIS — R112 Nausea with vomiting, unspecified: Secondary | ICD-10-CM

## 2022-05-19 DIAGNOSIS — M797 Fibromyalgia: Secondary | ICD-10-CM | POA: Diagnosis present

## 2022-05-19 DIAGNOSIS — Z7902 Long term (current) use of antithrombotics/antiplatelets: Secondary | ICD-10-CM

## 2022-05-19 DIAGNOSIS — E782 Mixed hyperlipidemia: Secondary | ICD-10-CM | POA: Diagnosis not present

## 2022-05-19 DIAGNOSIS — N179 Acute kidney failure, unspecified: Secondary | ICD-10-CM | POA: Diagnosis present

## 2022-05-19 DIAGNOSIS — Z888 Allergy status to other drugs, medicaments and biological substances status: Secondary | ICD-10-CM

## 2022-05-19 DIAGNOSIS — I252 Old myocardial infarction: Secondary | ICD-10-CM | POA: Diagnosis not present

## 2022-05-19 DIAGNOSIS — D649 Anemia, unspecified: Secondary | ICD-10-CM

## 2022-05-19 DIAGNOSIS — I5022 Chronic systolic (congestive) heart failure: Secondary | ICD-10-CM | POA: Diagnosis present

## 2022-05-19 DIAGNOSIS — K5981 Ogilvie syndrome: Principal | ICD-10-CM | POA: Diagnosis present

## 2022-05-19 DIAGNOSIS — Z515 Encounter for palliative care: Secondary | ICD-10-CM

## 2022-05-19 DIAGNOSIS — Z8719 Personal history of other diseases of the digestive system: Secondary | ICD-10-CM

## 2022-05-19 DIAGNOSIS — R066 Hiccough: Secondary | ICD-10-CM | POA: Diagnosis not present

## 2022-05-19 DIAGNOSIS — Z9109 Other allergy status, other than to drugs and biological substances: Secondary | ICD-10-CM

## 2022-05-19 DIAGNOSIS — Z7985 Long-term (current) use of injectable non-insulin antidiabetic drugs: Secondary | ICD-10-CM

## 2022-05-19 DIAGNOSIS — E1100 Type 2 diabetes mellitus with hyperosmolarity without nonketotic hyperglycemic-hyperosmolar coma (NKHHC): Secondary | ICD-10-CM | POA: Diagnosis not present

## 2022-05-19 DIAGNOSIS — Z833 Family history of diabetes mellitus: Secondary | ICD-10-CM

## 2022-05-19 DIAGNOSIS — Z6821 Body mass index (BMI) 21.0-21.9, adult: Secondary | ICD-10-CM

## 2022-05-19 DIAGNOSIS — K567 Ileus, unspecified: Secondary | ICD-10-CM | POA: Diagnosis present

## 2022-05-19 DIAGNOSIS — R54 Age-related physical debility: Secondary | ICD-10-CM | POA: Diagnosis present

## 2022-05-19 DIAGNOSIS — D631 Anemia in chronic kidney disease: Secondary | ICD-10-CM | POA: Diagnosis present

## 2022-05-19 DIAGNOSIS — M545 Low back pain, unspecified: Secondary | ICD-10-CM | POA: Diagnosis present

## 2022-05-19 DIAGNOSIS — I2510 Atherosclerotic heart disease of native coronary artery without angina pectoris: Secondary | ICD-10-CM | POA: Diagnosis present

## 2022-05-19 DIAGNOSIS — E119 Type 2 diabetes mellitus without complications: Secondary | ICD-10-CM

## 2022-05-19 DIAGNOSIS — M159 Polyosteoarthritis, unspecified: Secondary | ICD-10-CM | POA: Diagnosis present

## 2022-05-19 LAB — CMP (CANCER CENTER ONLY)
ALT: 47 U/L — ABNORMAL HIGH (ref 0–44)
AST: 32 U/L (ref 15–41)
Albumin: 3.7 g/dL (ref 3.5–5.0)
Alkaline Phosphatase: 90 U/L (ref 38–126)
Anion gap: 8 (ref 5–15)
BUN: 25 mg/dL — ABNORMAL HIGH (ref 8–23)
CO2: 26 mmol/L (ref 22–32)
Calcium: 9.3 mg/dL (ref 8.9–10.3)
Chloride: 105 mmol/L (ref 98–111)
Creatinine: 1.75 mg/dL — ABNORMAL HIGH (ref 0.61–1.24)
GFR, Estimated: 41 mL/min — ABNORMAL LOW (ref >60)
Glucose, Bld: 220 mg/dL — ABNORMAL HIGH (ref 70–99)
Potassium: 3.8 mmol/L (ref 3.5–5.1)
Sodium: 139 mmol/L (ref 135–145)
Total Bilirubin: 0.9 mg/dL (ref 0.3–1.2)
Total Protein: 7.9 g/dL (ref 6.5–8.1)

## 2022-05-19 LAB — CBC WITH DIFFERENTIAL (CANCER CENTER ONLY)
Abs Immature Granulocytes: 0.03 10*3/uL (ref 0.00–0.07)
Basophils Absolute: 0 10*3/uL (ref 0.0–0.1)
Basophils Relative: 1 %
Eosinophils Absolute: 0.1 10*3/uL (ref 0.0–0.5)
Eosinophils Relative: 2 %
HCT: 33.3 % — ABNORMAL LOW (ref 39.0–52.0)
Hemoglobin: 11.2 g/dL — ABNORMAL LOW (ref 13.0–17.0)
Immature Granulocytes: 1 %
Lymphocytes Relative: 24 %
Lymphs Abs: 1.3 10*3/uL (ref 0.7–4.0)
MCH: 27.9 pg (ref 26.0–34.0)
MCHC: 33.6 g/dL (ref 30.0–36.0)
MCV: 82.8 fL (ref 80.0–100.0)
Monocytes Absolute: 0.6 10*3/uL (ref 0.1–1.0)
Monocytes Relative: 11 %
Neutro Abs: 3.4 10*3/uL (ref 1.7–7.7)
Neutrophils Relative %: 61 %
Platelet Count: 354 10*3/uL (ref 150–400)
RBC: 4.02 MIL/uL — ABNORMAL LOW (ref 4.22–5.81)
RDW: 15.7 % — ABNORMAL HIGH (ref 11.5–15.5)
WBC Count: 5.5 10*3/uL (ref 4.0–10.5)
nRBC: 0 % (ref 0.0–0.2)

## 2022-05-19 LAB — CBC WITH DIFFERENTIAL/PLATELET
Abs Immature Granulocytes: 0.03 10*3/uL (ref 0.00–0.07)
Basophils Absolute: 0 10*3/uL (ref 0.0–0.1)
Basophils Relative: 0 %
Eosinophils Absolute: 0 10*3/uL (ref 0.0–0.5)
Eosinophils Relative: 0 %
HCT: 36.1 % — ABNORMAL LOW (ref 39.0–52.0)
Hemoglobin: 11.5 g/dL — ABNORMAL LOW (ref 13.0–17.0)
Immature Granulocytes: 1 %
Lymphocytes Relative: 23 %
Lymphs Abs: 1.2 10*3/uL (ref 0.7–4.0)
MCH: 27.7 pg (ref 26.0–34.0)
MCHC: 31.9 g/dL (ref 30.0–36.0)
MCV: 87 fL (ref 80.0–100.0)
Monocytes Absolute: 0.4 10*3/uL (ref 0.1–1.0)
Monocytes Relative: 7 %
Neutro Abs: 3.4 10*3/uL (ref 1.7–7.7)
Neutrophils Relative %: 69 %
Platelets: 320 10*3/uL (ref 150–400)
RBC: 4.15 MIL/uL — ABNORMAL LOW (ref 4.22–5.81)
RDW: 15.9 % — ABNORMAL HIGH (ref 11.5–15.5)
WBC: 5 10*3/uL (ref 4.0–10.5)
nRBC: 0 % (ref 0.0–0.2)

## 2022-05-19 LAB — COMPREHENSIVE METABOLIC PANEL
ALT: 51 U/L — ABNORMAL HIGH (ref 0–44)
AST: 39 U/L (ref 15–41)
Albumin: 3.4 g/dL — ABNORMAL LOW (ref 3.5–5.0)
Alkaline Phosphatase: 86 U/L (ref 38–126)
Anion gap: 10 (ref 5–15)
BUN: 27 mg/dL — ABNORMAL HIGH (ref 8–23)
CO2: 24 mmol/L (ref 22–32)
Calcium: 9.1 mg/dL (ref 8.9–10.3)
Chloride: 105 mmol/L (ref 98–111)
Creatinine, Ser: 2.03 mg/dL — ABNORMAL HIGH (ref 0.61–1.24)
GFR, Estimated: 35 mL/min — ABNORMAL LOW (ref >60)
Glucose, Bld: 203 mg/dL — ABNORMAL HIGH (ref 70–99)
Potassium: 4.2 mmol/L (ref 3.5–5.1)
Sodium: 139 mmol/L (ref 135–145)
Total Bilirubin: 1 mg/dL (ref 0.3–1.2)
Total Protein: 7.8 g/dL (ref 6.5–8.1)

## 2022-05-19 LAB — MAGNESIUM: Magnesium: 1.9 mg/dL (ref 1.7–2.4)

## 2022-05-19 LAB — LACTIC ACID, PLASMA
Lactic Acid, Venous: 2.8 mmol/L (ref 0.5–1.9)
Lactic Acid, Venous: 1.8 mmol/L (ref 0.5–1.9)

## 2022-05-19 LAB — SURGICAL PATHOLOGY

## 2022-05-19 LAB — GLUCOSE, CAPILLARY
Glucose-Capillary: 127 mg/dL — ABNORMAL HIGH (ref 70–99)
Glucose-Capillary: 146 mg/dL — ABNORMAL HIGH (ref 70–99)

## 2022-05-19 LAB — BRAIN NATRIURETIC PEPTIDE: B Natriuretic Peptide: 25.9 pg/mL (ref 0.0–100.0)

## 2022-05-19 LAB — LIPASE, BLOOD: Lipase: 62 U/L — ABNORMAL HIGH (ref 11–51)

## 2022-05-19 MED ORDER — LIDOCAINE-PRILOCAINE 2.5-2.5 % EX CREA
TOPICAL_CREAM | CUTANEOUS | 3 refills | Status: AC
  Filled 2022-05-19: qty 30, 20d supply, fill #0

## 2022-05-19 MED ORDER — ONDANSETRON 4 MG PO TBDP
8.0000 mg | ORAL_TABLET | Freq: Once | ORAL | Status: AC
  Filled 2022-05-19: qty 2

## 2022-05-19 MED ORDER — PANTOPRAZOLE SODIUM 40 MG IV SOLR
40.0000 mg | Freq: Once | INTRAVENOUS | Status: AC
  Administered 2022-05-19: 40 mg via INTRAVENOUS
  Filled 2022-05-19: qty 10

## 2022-05-19 MED ORDER — TRAMADOL HCL 50 MG PO TABS
50.0000 mg | ORAL_TABLET | Freq: Once | ORAL | Status: AC
  Administered 2022-05-19: 50 mg via ORAL
  Filled 2022-05-19: qty 1

## 2022-05-19 MED ORDER — HYDROMORPHONE HCL 1 MG/ML IJ SOLN
1.0000 mg | Freq: Once | INTRAMUSCULAR | Status: AC
  Administered 2022-05-19: 1 mg via INTRAVENOUS
  Filled 2022-05-19: qty 1

## 2022-05-19 MED ORDER — SODIUM CHLORIDE 0.9 % IV SOLN: Freq: Once | INTRAVENOUS | Status: AC

## 2022-05-19 MED ORDER — LIDOCAINE VISCOUS HCL 2 % MT SOLN
15.0000 mL | Freq: Once | OROMUCOSAL | Status: AC
  Administered 2022-05-19: 15 mL via ORAL
  Filled 2022-05-19: qty 15

## 2022-05-19 MED ORDER — INSULIN ASPART 100 UNIT/ML IJ SOLN
0.0000 [IU] | Freq: Every day | INTRAMUSCULAR | Status: DC
  Administered 2022-06-02: 2 [IU] via SUBCUTANEOUS

## 2022-05-19 MED ORDER — ONDANSETRON 8 MG PO TBDP: 8.0000 mg | ORAL_TABLET | Freq: Once | ORAL | Status: DC

## 2022-05-19 MED ORDER — ONDANSETRON HCL 4 MG/2ML IJ SOLN
4.0000 mg | Freq: Once | INTRAMUSCULAR | Status: AC
  Administered 2022-05-19: 4 mg via INTRAVENOUS
  Filled 2022-05-19: qty 2

## 2022-05-19 MED ORDER — TRAMADOL HCL 50 MG PO TABS: 50.0000 mg | ORAL_TABLET | Freq: Once | ORAL | Status: DC

## 2022-05-19 MED ORDER — INSULIN ASPART 100 UNIT/ML IJ SOLN
0.0000 [IU] | Freq: Three times a day (TID) | INTRAMUSCULAR | Status: DC
  Administered 2022-05-22 – 2022-06-03 (×15): 1 [IU] via SUBCUTANEOUS
  Administered 2022-06-04 (×2): 2 [IU] via SUBCUTANEOUS
  Administered 2022-06-04 – 2022-06-05 (×2): 1 [IU] via SUBCUTANEOUS

## 2022-05-19 MED ORDER — ONDANSETRON 8 MG PO TBDP
8.0000 mg | ORAL_TABLET | ORAL | Status: AC
  Administered 2022-05-19: 8 mg via ORAL
  Filled 2022-05-19: qty 1

## 2022-05-19 MED ORDER — PROCHLORPERAZINE EDISYLATE 10 MG/2ML IJ SOLN
10.0000 mg | Freq: Four times a day (QID) | INTRAMUSCULAR | Status: DC | PRN
  Administered 2022-05-23 – 2022-06-03 (×2): 10 mg via INTRAVENOUS
  Filled 2022-05-19 (×2): qty 2

## 2022-05-19 MED ORDER — SODIUM CHLORIDE 0.9 % IV BOLUS
1000.0000 mL | Freq: Once | INTRAVENOUS | Status: AC
  Administered 2022-05-19: 1000 mL via INTRAVENOUS

## 2022-05-19 MED ORDER — LORAZEPAM 2 MG/ML IJ SOLN
1.0000 mg | Freq: Four times a day (QID) | INTRAMUSCULAR | Status: DC | PRN
  Administered 2022-05-20 – 2022-06-04 (×5): 1 mg via INTRAVENOUS
  Filled 2022-05-19 (×5): qty 1

## 2022-05-19 MED ORDER — PANTOPRAZOLE SODIUM 40 MG IV SOLR
40.0000 mg | Freq: Two times a day (BID) | INTRAVENOUS | Status: DC
  Administered 2022-05-19 – 2022-05-25 (×12): 40 mg via INTRAVENOUS
  Filled 2022-05-19 (×12): qty 10

## 2022-05-19 MED ORDER — FENTANYL CITRATE PF 50 MCG/ML IJ SOSY
50.0000 ug | PREFILLED_SYRINGE | INTRAMUSCULAR | Status: DC | PRN
  Administered 2022-05-19 – 2022-05-20 (×4): 50 ug via INTRAVENOUS
  Filled 2022-05-19 (×4): qty 1

## 2022-05-19 MED ORDER — SODIUM CHLORIDE 0.9 % IV SOLN: INTRAVENOUS | Status: DC

## 2022-05-19 MED ORDER — ALUM & MAG HYDROXIDE-SIMETH 200-200-20 MG/5ML PO SUSP
30.0000 mL | Freq: Once | ORAL | Status: AC
  Administered 2022-05-19: 30 mL via ORAL
  Filled 2022-05-19: qty 30

## 2022-05-19 MED FILL — Dexamethasone Sodium Phosphate Inj 100 MG/10ML: INTRAMUSCULAR | Qty: 1 | Status: AC

## 2022-05-19 NOTE — ED Notes (Signed)
Date and time results received: 05/19/22 1419 (use smartphrase ".now" to insert current time)  Test: [place] Critical Value: 2.8  Name of Provider Notified: [facility]

## 2022-05-19 NOTE — Consult Note (Signed)
Palliative Care Consult Note                                  Date: 05/19/2022   Patient Name: Antonio Guerra  DOB: 02-Dec-1950  MRN: [id number]  Age / Sex: 71 y.o., male  PCP: [name], MD Referring Physician: Jonnie Finner, DO  Reason for Consultation: Establishing goals of care  HPI/Patient Profile: Palliative Care consult requested for goals of care discussion in this 71 y.o. male  with past medical history of merkel cell carcinoma s/p 3 cycles of Keytruda with progression, diabetes, hypertension, CAD, CHF, MI, fibromylagia, and CVA. He was admitted on 05/19/2022 after being seen at [facility] with nausea, vomiting, and dehydration.   Past Medical History:  Diagnosis Date   Altered mental status    Arthritis    "all over" (12/20/2015)   Chronic lower back pain    Chronic systolic congestive heart failure ([place])    a. 04/2014 Echo: EF 20-25%.   Coronary artery disease    a. 2011 MI x 2 with PCI: stent x 2 (LAD and RI) @ [facility] in [place], [place];  b. 04/2014 Cath/PCI: LM 10-20, LAD 40p, 72m 837mSR(3.5x38 Xience DES), 50apical, LCX 20 diffuse, RI 50p, 2084mR, RCA 40-7m78md.   Fibromyalgia    Hyperlipidemia    Hypertension    Hypertensive urgency 09/27/2015   [facility]   [facility]   "right hand weaker since" (12/20/2015)   Type II diabetes mellitus (HCC)[place]   Subjective:   This NP NikkOsborne Omaniewed medical records, received report from team, assessed the patient and then met at the patient's bedside with [name] diagnosis, prognosis, GOC, EOL wishes disposition and options. I also spoke at length with his daughter, POA,[name] phone as requested by patient.    Concept of Palliative Care was introduced as specialized medical care for people and their families living with serious illness.  It focuses on providing relief from the symptoms and stress of a  serious illness.  The goal is to improve quality of life for both the patient and the family. Values and goals of care important to patient and family were attempted to be elicited.  Antonio Guerra that he lives in the home with his daughter, [name] his grandchildren.  His wife is deceased and was under the care of hospice at the time of her death.  He had 3 children, unfortunately only one is living.  8 grandchildren.  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  Appetite has been minimal due to ongoing nausea and vomiting.  I created space and opportunity for patient and family to explore state of health prior to admission, thoughts, and feelings.   We discussed His current illness and what it means in the larger context of His on-going co-morbidities. Natural disease trajectory and expectations were discussed.  Patient and his daughter verbalized understanding of current illness and co-morbidities.  I had an open and honest conversation with them both expressing concerns of his poor performance and inability to tolerate any future chemotherapies as also discussed by Dr.Iruku.  Can he verbalizes understanding expressing "it is going to be what is going be".  He states that he wants to live  however also knows that his health is not improving.  Patient's daughter, [name] is adamant that patient will improve to the point he will be able to tolerate further treatment.  She states on multiple occasions they are not giving up and is not interested in discussing hospice at all.  I acknowledged her statements but also attempted to have discussions with her realistically while supporting her thoughts and feelings.  Daughter shares her frustration being unable to determine why patient continues to be nauseated and unable to eat.  I discussed at length his disease trajectory and previous treatments could be a  factor in his symptoms given he has had extensive work-up with no significant findings.  She verbalized understanding however begins to state "if we cannot take care of him here she would then take him to [facility] or [facility]".  I again empathetically approach discussions regarding patient's wishes keeping him at the center of all conversations and decision-making.  [name] shares her previous experience with hospice and awareness of their care referring to their involvement during her mother's end-of-life care.  She did not have a bad experience however she is just not ready to "give up" on her father.  She states she does not want him to go to hospice.  I attempted to clarify that patient would not have to go to the facility if that is not what they wanted but could indeed have the support in the home which could be collaborated with [facility].   I approach discussions regarding his full CODE STATUS with consideration of his current illness and comorbidities.  Patient states he wants to live but also expresses in the same sentence he would not wish to be kept alive artificially.  At this time he and his daughter are requesting for him to remain a full code with understanding his chances of survival during a cardiopulmonary event would be minimal as well as for quality of life.  [name] is clear in his expressed wishes to support what ever decisions his daughter made as he knows she has his best interest at heart.  He confirms wishes for all decisions to be made by her as she is his documented healthcare power of attorney.  Patient's daughter is clear in her expressed wishes that she would want full aggressive care and interventions.  She states repetitively that she would want him to have a feeding tube placed so he can get some nutrition and build up his strength.  I discussed with her that placement of a feeding tube is not going to result in a meaningful recovery and also could potentially cause more  harm/symptoms given his ongoing nausea and vomiting.  She verbalized understanding.  Daughter shares patient was just recently approved [facility] and was in the process of being admitted there for extensive rehab to facilitate some improvement.  She states this is still there goal and she is not interested in talking about anything outside of this.  I discussed the importance of continued conversation with family and their medical providers regarding overall plan of care and treatment options, ensuring decisions are within the context of the patients values and GOCs.  Questions and concerns were addressed. The family was encouraged to call with questions or concerns.  PMT will continue to support holistically as needed.   Objective:   Primary Diagnoses: Present on Admission: **None**   PRN Meds: fentaNYL (SUBLIMAZE) injection, prochlorperazine  Allergies  Allergen  Reactions   Lisinopril Other (See Comments)    Caused pt to have chest pains.    Tape Itching and Rash    Review of Systems  Constitutional:  Positive for appetite change.  HENT:  Positive for voice change.   Musculoskeletal:  Positive for arthralgias.  Neurological:  Positive for weakness.  Unless otherwise noted, a complete review of systems is negative.  Physical Exam General: frail chronically-ill appearing Cardiovascular: Tachycardic Abdomen: soft, tender, + bowel sounds Extremities: no edema, no joint deformities Skin: no rashes, warm and dry Neurological: AAOx3, mood appropriate, low pressured speech  Vital Signs:  BP 139/81   Pulse (!) 113   Temp 97.8 F (36.6 C) (Oral)   Resp 20   SpO2 94%  Pain Scale: 0-10   Pain Score: 10-Worst pain ever  SpO2: SpO2: 94 % O2 Device:SpO2: 94 % O2 Flow Rate: .   IO: Intake/output summary: No intake or output data in the 24 hours ending 05/19/22 1531  LBM:   Baseline Weight:   Most recent weight:         Palliative Assessment/Data:    Advanced Care Planning:   Primary Decision Maker: PATIENT and daughter, [name] Education officer, community)  Code Status/Advance Care Planning: Full code  Assessment & Plan:   SUMMARY OF RECOMMENDATIONS   Full Code-as requested and confirmed by patient and daughter Continue with current plan of care  Daughter is clear and expressed wishes to continue to treat the treatable aggressively.  She states that they would want to perform all necessary interventions to allow patient every opportunity to continue to live.  Extensive discussion regarding CODE STATUS, poor overall functional state, and inability to tolerate further chemo as discussed with his oncologist.  Both patient and daughter expressed understanding however remains hopeful for improvement by any means. Ongoing goals of care discussions and support PMT will continue to support and follow as needed. Please call team line with urgent needs.  Symptom Management:  Per Attending  Palliative Prophylaxis:  Aspiration, Bowel Regimen, Frequent Pain Assessment, Oral Care, and Nausea  Additional Recommendations (Limitations, Scope, Preferences): Full Scope Treatment  Psycho-social/Spiritual:  Desire for further Chaplaincy support: no Additional Recommendations:  Ongoing goals of care discussions  Prognosis:  Guarded-Poor  Discharge Planning:  To Be Determined    Patient and daughter expressed understanding and was in agreement with this plan.   Time Total: 55 min   Visit consisted of counseling and education dealing with the complex and emotionally intense issues of symptom management and palliative care in the setting of serious and potentially life-threatening illness.Greater than 50%  of this time was spent counseling and coordinating care related to the above assessment and plan.  Signed by:  Alda Lea, AGPCNP-BC Palliative Medicine Team  Phone: [phone] Pager:  [phone] Amion: Bjorn Pippin   Thank you for allowing the Palliative Medicine Team to assist in the care of this patient. Please utilize secure chat with additional questions, if there is no response within 30 minutes please call the above phone number. Palliative Medicine Team providers are available by phone from 7am to 5pm daily and can be reached through the team cell phone.  Should this patient require assistance outside of these hours, please call the patient's attending physician.

## 2022-05-19 NOTE — H&P (Signed)
History and Physical    Patient: Antonio Guerra YPP:[id number] DOB: Sep 08, 1950 DOA: 05/19/2022 DOS: the patient was seen and examined on 05/19/2022 PCP: [name], MD  Patient coming from: Home  Chief Complaint:  Chief Complaint  Patient presents with   Emesis   HPI: Antonio Guerra is a 71 y.o. male with medical history significant of S4 Merkel Cell carcinoma, erosive esophagitis, CKD3b, DM2, chronic systolic HF, CAD. Presenting with intractable N/V. He was recently admitted to the hospital for the same thing a week ago. W/u at that point, including an EGD, showed severe esophagitis. He was placed on carafate and protonix. His N/V improved and he was able to go home. He reported to his [facility] infusion appointment today. When getting ready for his infusion, he had an episode of N/V. There was about 400 cc of dark colored emesis. The clinic staff became concerned and sent him to the ED for evaluation.   Review of Systems: As mentioned in the history of present illness. All other systems reviewed and are negative. Past Medical History:  Diagnosis Date   Altered mental status    Arthritis    "all over" (12/20/2015)   Chronic lower back pain    Chronic systolic congestive heart failure ([place])    a. 04/2014 Echo: EF 20-25%.   Coronary artery disease    a. 2011 MI x 2 with PCI: stent x 2 (LAD and RI) @ [facility] in [place], [place];  b. 04/2014 Cath/PCI: LM 10-20, LAD 40p, 58m 813mSR(3.5x38 Xience DES), 50apical, LCX 20 diffuse, RI 50p, 2041mR, RCA 40-33m2md.   Fibromyalgia    Hyperlipidemia    Hypertension    Hypertensive urgency 09/27/2015   [facility]   [facility]   "right hand weaker since" (12/20/2015)   [facility] Past Surgical History:  Procedure Laterality Date   ANKLE FRACTURE SURGERY Right    BIOPSY  05/13/2022   Procedure: BIOPSY;  Surgeon: PyrtJerene Bears;  [facility];  Service: Gastroenterology;;   egd with biopsies   CARDIAC CATHETERIZATION N/A 09/30/2015   Procedure: Left Heart Cath and Coronary Angiography;  Surgeon: ChriBurnell Blanks;  [facility];  Service: Cardiovascular;  Laterality: N/A;   CORONARY ANGIOPLASTY WITH STENT PLACEMENT     "total of 3 stents" (12/20/2015)   ESOPHAGOGASTRODUODENOSCOPY (EGD) WITH PROPOFOL N/A 05/13/2022   Procedure: ESOPHAGOGASTRODUODENOSCOPY (EGD) WITH PROPOFOL;  Surgeon: PyrtJerene Bears;  [facility]: [facility];  Service: Gastroenterology;  Laterality: N/A;   FLEXIBLE SIGMOIDOSCOPY N/A 05/13/2022   Procedure: FLEXIBLE SIGMOIDOSCOPY;  Surgeon: PyrtJerene Bears;  [facility]: [facility];  Service: Gastroenterology;  Laterality: N/A;   FRACTIONAL FLOW RESERVE WIRE  05/11/2014   Procedure: FRACTIONAL FLOW RESERVE WIRE;  Surgeon: ChriBurnell Blanks;  [facility];  Service: Cardiovascular;;   FRACTURE SURGERY     LEFT HEART CATHETERIZATION WITH CORONARY/GRAFT ANGIOGRAM N/A 05/11/2014   Procedure: LEFT HEART CATHETERIZATION WITH COROBeatrix Fettersurgeon: ChriBurnell Blanks;  [facility];  Service: Cardiovascular;  Laterality: N/A;   PERCUTANEOUS CORONARY STENT INTERVENTION (PCI-S)  05/11/2014   Procedure: PERCUTANEOUS CORONARY STENT INTERVENTION (PCI-S);  Surgeon: ChriBurnell Blanks;  [facility];  Service: Cardiovascular;;   Social History:  reports that he has quit smoking. He has never used smokeless tobacco. He reports that he does not currently use alcohol after a past  usage of about 5.0 standard drinks of alcohol per week. He reports that he does not use drugs.  Allergies  Allergen Reactions   Lisinopril Other (See Comments)    Caused pt to have chest pains.    Tape Itching and Rash    Family History  Problem Relation Age of Onset   Diabetes Mother    CAD Neg Hx    Stomach cancer Neg Hx    Colon cancer Neg Hx    Esophageal cancer Neg Hx    Pancreatic  cancer Neg Hx     Prior to Admission medications   Medication Sig Start Date End Date Taking? Authorizing Provider  acetaminophen (TYLENOL) 650 MG CR tablet Take 650 mg by mouth every 8 (eight) hours as needed for pain.    [provider]  alum & mag hydroxide-simeth (MAALOX/MYLANTA) 200-200-20 MG/5ML suspension Take 15 mLs by mouth every 4 (four) hours as needed for indigestion or heartburn.    [provider]  atorvastatin (LIPITOR) 80 MG tablet TAKE 1 TABLET(80 MG) BY MOUTH AT BEDTIME Patient taking differently: Take 80 mg by mouth at bedtime. 05/19/21   [name], MD  Blood Glucose Monitoring Suppl (ONETOUCH VERIO) w/Device KIT 30 Units by Does not apply route daily. 11/02/17   Diallo, Earna Coder, MD  carvedilol (COREG) 12.5 MG tablet Take 12.5 mg by mouth 2 (two) times daily with a meal.    [provider]  clopidogrel (PLAVIX) 75 MG tablet TAKE 1 TABLET(75 MG) BY MOUTH DAILY Patient taking differently: Take 75 mg by mouth daily. 01/29/21   [name], MD  Continuous Blood Gluc Receiver (FREESTYLE LIBRE 14 DAY READER) DEVI 1 Device by Does not apply route every morning. 11/08/19   Bonnita Hollow, MD  cycloSPORINE (RESTASIS) 0.05 % ophthalmic emulsion Place 1 drop into both eyes 2 (two) times daily.    [provider]  dexamethasone (DECADRON) 4 MG tablet Take 2 tablets daily for 2 days, on days 4 and 5.Take with food. Every 21 days. 05/13/22   Benay Pike, MD  Dulaglutide (TRULICITY) 5.32 YE/3.3ID SOPN Inject 0.75 mg into the skin once a week. Friday or saturday    [provider]  gabapentin (NEURONTIN) 400 MG capsule TAKE 1 CAPSULE BY MOUTH TWICE DAILY Patient taking differently: Take 400 mg by mouth 2 (two) times daily. 02/20/21   [name], MD  Incontinence Supply Disposable (DEPEND ADJUSTABLE UNDERWEAR LG) MISC 1 application by Does not apply route as needed. 08/09/20   [name], MD  Insulin Pen Needle (BD PEN NEEDLE NANO U/F) 32G  X 4 MM MISC AS DIRECTED TWICE DAILY 02/24/21   [name], MD  isosorbide mononitrate (ISMO) 10 MG tablet Take 10 mg by mouth daily.    [provider]  Lancets [facility] ULTRASOFT) lancets USE AS DIRECTED 08/09/20   [name], MD  lidocaine (SALONPAS PAIN RELIEVING) 4 % Place 1 patch onto the skin daily as needed (pain).    [provider]  lidocaine-prilocaine (EMLA) cream Apply to port site at least 1 hour prior to use 05/19/22   Iruku, Arletha Pili, MD  metoCLOPramide (REGLAN) 10 MG tablet Take 1 tablet (10 mg total) by mouth every 6 (six) hours as needed for up to 20 days for nausea. 05/04/22 05/24/22  Fransico Meadow, MD  nitroGLYCERIN (NITROSTAT) 0.4 MG SL tablet PLACE 1 TABLET UNDER THE TONGUE EVERY 5 MINS AS NEEDED FOR CHEST PAIN Patient taking differently: Place 0.4 mg under the tongue every  5 (five) minutes as needed for chest pain. 04/08/21   [name], MD  ondansetron (ZOFRAN) 4 MG tablet Take 4 mg by mouth every 4 (four) hours as needed for nausea or vomiting.    [provider]  ondansetron (ZOFRAN) 8 MG tablet Take 1 tablet (8 mg total) by mouth every 8 (eight) hours as needed for nausea or vomiting. Start on third day after chemotherapy. 05/13/22   Benay Pike, MD  ONETOUCH VERIO test strip USE TO TEST BLOOD SUGAR 2 TO 3 TIMES A DAY 04/09/21   [name], MD  prochlorperazine (COMPAZINE) 10 MG tablet Take 1 tablet (10 mg total) by mouth every 6 (six) hours as needed for nausea or vomiting (Nausea or vomiting). 05/13/22   Benay Pike, MD  sucralfate (CARAFATE) 1 g tablet Take 1 tablet (1 g total) by mouth 4 (four) times daily -  with meals and at bedtime for 7 days. May use longer if needed. 05/15/22 05/22/22  Eugenie Filler, MD  pantoprazole (PROTONIX) 40 MG tablet Take 1 tablet (40 mg total) by mouth 2 (two) times daily for 56 days, THEN 1 tablet (40 mg total) daily. 05/15/22 05/15/22  Eugenie Filler, MD    Physical Exam: Vitals:    05/19/22 1207 05/19/22 1211 05/19/22 1424  BP:  104/77 (!) 144/84  Pulse:  91 (!) 111  Resp:  17 18  Temp: 98.1 F (36.7 C) 98.1 F (36.7 C) 97.8 F (36.6 C)  TempSrc: Oral Oral Oral  SpO2:  98% 97%   General: 71 y.o. male resting in bed in NAD Eyes: PERRL, normal sclera ENMT: Nares patent w/o discharge, orophaynx clear, dentition normal, ears w/o discharge/lesions/ulcers Neck: Supple, trachea midline Cardiovascular: RRR, +S1, S2, no m/g/r, equal pulses throughout Respiratory: CTABL, no w/r/r, normal WOB GI: BS hypoactive, distended but soft, global TTP MSK: No e/c/c Neuro: A&O x 3, no focal deficits Psyc: anxious but cooperative  Data Reviewed:  Results for orders placed or performed during the hospital encounter of 05/19/22 (from the past 24 hour(s))  CBC with Differential     Status: Abnormal   Collection Time: 05/19/22  1:19 PM  Result Value Ref Range   WBC 5.0 4.0 - 10.5 K/uL   RBC 4.15 (L) 4.22 - 5.81 MIL/uL   Hemoglobin 11.5 (L) 13.0 - 17.0 g/dL   HCT 36.1 (L) 39.0 - 52.0 %   MCV 87.0 80.0 - 100.0 fL   MCH 27.7 26.0 - 34.0 pg   MCHC 31.9 30.0 - 36.0 g/dL   RDW 15.9 (H) 11.5 - 15.5 %   Platelets 320 150 - 400 K/uL   nRBC 0.0 0.0 - 0.2 %   Neutrophils Relative % 69 %   Neutro Abs 3.4 1.7 - 7.7 K/uL   Lymphocytes Relative 23 %   Lymphs Abs 1.2 0.7 - 4.0 K/uL   Monocytes Relative 7 %   Monocytes Absolute 0.4 0.1 - 1.0 K/uL   Eosinophils Relative 0 %   Eosinophils Absolute 0.0 0.0 - 0.5 K/uL   Basophils Relative 0 %   Basophils Absolute 0.0 0.0 - 0.1 K/uL   Immature Granulocytes 1 %   Abs Immature Granulocytes 0.03 0.00 - 0.07 K/uL  Comprehensive metabolic panel     Status: Abnormal   Collection Time: 05/19/22  1:19 PM  Result Value Ref Range   Sodium 139 135 - 145 mmol/L   Potassium 4.2 3.5 - 5.1 mmol/L   Chloride 105 98 - 111 mmol/L  CO2 24 22 - 32 mmol/L   Glucose, Bld 203 (H) 70 - 99 mg/dL   BUN 27 (H) 8 - 23 mg/dL   Creatinine, Ser 2.03 (H) 0.61 -  1.24 mg/dL   Calcium 9.1 8.9 - 10.3 mg/dL   Total Protein 7.8 6.5 - 8.1 g/dL   Albumin 3.4 (L) 3.5 - 5.0 g/dL   AST 39 15 - 41 U/L   ALT 51 (H) 0 - 44 U/L   Alkaline Phosphatase 86 38 - 126 U/L   Total Bilirubin 1.0 0.3 - 1.2 mg/dL   GFR, Estimated 35 (L) >60 mL/min   Anion gap 10 5 - 15  Lipase, blood     Status: Abnormal   Collection Time: 05/19/22  1:19 PM  Result Value Ref Range   Lipase 62 (H) 11 - 51 U/L  Magnesium     Status: None   Collection Time: 05/19/22  1:19 PM  Result Value Ref Range   Magnesium 1.9 1.7 - 2.4 mg/dL  Lactic acid, plasma     Status: Abnormal   Collection Time: 05/19/22  1:19 PM  Result Value Ref Range   Lactic Acid, Venous 2.8 (HH) 0.5 - 1.9 mmol/L   KUB: Dilated colon most consistent with ileus. Small bowel dilatation is noted. Moderate stool burden.  Assessment and Plan: Intractable N/V Ileus vs colonic obstruction     - admit to inpt, tele     - place NGT     - spoke with general surgery; they deferred to GI     - spoke with [facility]; they will see him     - compazine, protonix BID, fluids     - pain control  Esophagitis     - continue BID PPI  AKI on CKD3a     - secondary to N/V     - fluids, watch nephrotoxins     Merkel Cell Carcinoma     - onco aware he has been admitted     - palliative care consult  DM2     - NPO for now     - sensitive SSI, glucose checks  HLD     - hold home regimen for now  Normocytic anemia     - no evidence of bleed, follow  Chronic systolic HF     - I&O, daily weights     - getting gentle fluids     - hold home regimen for now  Advance Care Planning:   Code Status: FULL  Consults: [facility], Palliative care  Family Communication: w/ daughter at bedside  Severity of Illness: The appropriate patient status for this patient is INPATIENT. Inpatient status is judged to be reasonable and necessary in order to provide the required intensity of service to ensure the patient's safety. The patient's  presenting symptoms, physical exam findings, and initial radiographic and laboratory data in the context of their chronic comorbidities is felt to place them at high risk for further clinical deterioration. Furthermore, it is not anticipated that the patient will be medically stable for discharge from the hospital within 2 midnights of admission.   * I certify that at the point of admission it is my clinical judgment that the patient will require inpatient hospital care spanning beyond 2 midnights from the point of admission due to high intensity of service, high risk for further deterioration and high frequency of surveillance required.*  Author: Jonnie Finner, DO 05/19/2022 2:39 PM  For on call review [email or web address].

## 2022-05-19 NOTE — ED Provider Triage Note (Signed)
Emergency Medicine Provider Triage Evaluation Note  Antonio Guerra , a 71 y.o. male  was evaluated in triage.  Pt complains of nausea and vomiting.  He says that he is currently being treated for Merkel cell carcinoma and "cannot take chemo anymore."  He says that he cannot tolerate it.  Last chemotherapy last week.  Is having abdominal pain, nausea and vomiting ever since.  Per chart review patient was recently diagnosed with cancer.  He had a CT scan a week ago that showed right inguinal mass and potential colon metastases.  Patient was at the infusion center today and complained of severe stomach pain.  He had soiled clothing and was complaining of severe nausea.  He was transferred to the ED and chemotherapy was not done today.  Labs ordered, no imaging at this time  Review of Systems  Positive:  Negative:   Physical Exam  Temp 98.1 F (36.7 C) (Oral)  Gen:   Awake, no distress   Resp:  Normal effort  MSK:   Moves extremities without difficulty  Other:  Chronically ill-appearing.  Generalized tenderness  Medical Decision Making  Medically screening exam initiated at 12:10 PM.  Appropriate orders placed.  Ramell Wacha was informed that the remainder of the evaluation will be completed by another provider, this initial triage assessment does not replace that evaluation, and the importance of remaining in the ED until their evaluation is complete.     Rhae Hammock, PA-C 05/19/22 1215

## 2022-05-19 NOTE — Progress Notes (Signed)
Patient presented to infusion room c/o nausea, pain. Produced approx 400cc dark brown emesis. Patient vomited again after administration of Tramadol '50mg'$  and c/o severe stomach pain. Patient's clothes were soiled. Patient undressed, cleaned, and redressed with paper scrubs. Patient belongings, including cell phone placed in bag. Dr Chryl Heck contacted, and spoke with patient. Plan to transfer patient to ED. Hold chemo treatment today. Eloise Harman, PA-C performed chairside assessment and accompanied RN during transport of patient to ED via w/c. Patient belongings bag sent with patient.

## 2022-05-19 NOTE — Progress Notes (Signed)
Please see oncologist note from this morning for office visit with Dr. Chryl Heck. I was called to infusion to see patient for nausea with vomiting and uncontrolled pain. He was given tramadol 50 mg PO and immediately had 400 cc dark colored emesis per RN. Unable to obtain IV access to give IV medications, IV team consult was placed however they have not yet evaluated the patient. Per discussion with oncologist patient needs ED evaluation with likely admission for symptom control. He is not stable to have chemotherapy today with these symptoms. Could be candidate for hospice.  Patient was taken to ED by myself and RN.

## 2022-05-19 NOTE — Assessment & Plan Note (Signed)
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   He had PET/CT which showed enlarged FDG avid nodal metastasis identified in the right common iliac, external iliac and right inguinal node chains.  No signs to suggest distant nodal metastasis or solid organ metastasis.  Imaging after 3 cycles of Keytruda with progression hence we have discussed about doing carbo etoposide at 50% dose reduction given his poor performance status.  He adamantly wants to try some more treatment.  We have clearly discussed that chemotherapy has many toxicities including life-threatening and fatal toxicities.  He understands the common adverse effects of fatigue, nausea, vomiting, diarrhea, increased risk of infections etc.  Have dose reduced his carbidopa side to AUC of 3 and etoposide by 50%  Okay to proceed if labs are satisfactory today.  CBC reviewed and satisfactory.  I have called the daughter and discussed the plan with her.  He will be moving to the skilled nursing facility for a temporary time.   He should return to clinic in 1 week for follow-up.

## 2022-05-19 NOTE — ED Triage Notes (Addendum)
Patient stated he cannot stop vomiting for a month. Last chemo treatment was last week.

## 2022-05-19 NOTE — Addendum Note (Signed)
Addended by: Tora Kindred on: 05/19/2022 11:12 AM   Modules accepted: Orders

## 2022-05-19 NOTE — ED Provider Notes (Addendum)
[facility] DEPT Provider Note   CSN: [id number] Arrival date & time: 05/19/22  1157     History  Chief Complaint  Patient presents with   Emesis    Antonio Guerra is a 71 y.o. male complains of nausea, vomiting, abdominal pain.  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  He had an episode this week with similar abdominal pain, vomiting after chemotherapy.  His last episode was last week, he had just received tramadol prior to vomiting.  Recently diagnosed with Merkel cell carcinoma, last CT a week ago showed right inguinal mass, potential colon metastases.  He is having soiled clothing, severe nausea.  The note from the oncologist reported concern for possible transition to palliative care given the severity of his symptoms.   Emesis Associated symptoms: abdominal pain        Home Medications Prior to Admission medications   Medication Sig Start Date End Date Taking? Authorizing Provider  acetaminophen (TYLENOL) 650 MG CR tablet Take 650 mg by mouth every 8 (eight) hours as needed for pain.    [provider]  alum & mag hydroxide-simeth (MAALOX/MYLANTA) 200-200-20 MG/5ML suspension Take 15 mLs by mouth every 4 (four) hours as needed for indigestion or heartburn.    [provider]  atorvastatin (LIPITOR) 80 MG tablet TAKE 1 TABLET(80 MG) BY MOUTH AT BEDTIME Patient taking differently: Take 80 mg by mouth at bedtime. 05/19/21   [name], MD  Blood Glucose Monitoring Suppl (ONETOUCH VERIO) w/Device KIT 30 Units by Does not apply route daily. 11/02/17   Diallo, Earna Coder, MD  carvedilol (COREG) 12.5 MG tablet Take 12.5 mg by mouth 2 (two) times daily with a meal.    [provider]  clopidogrel (PLAVIX) 75 MG tablet TAKE 1 TABLET(75 MG) BY MOUTH DAILY Patient taking differently: Take 75 mg by mouth daily. 01/29/21   [name], MD  Continuous Blood Gluc Receiver  (FREESTYLE LIBRE 14 DAY READER) DEVI 1 Device by Does not apply route every morning. 11/08/19   Bonnita Hollow, MD  cycloSPORINE (RESTASIS) 0.05 % ophthalmic emulsion Place 1 drop into both eyes 2 (two) times daily.    [provider]  dexamethasone (DECADRON) 4 MG tablet Take 2 tablets daily for 2 days, on days 4 and 5.Take with food. Every 21 days. 05/13/22   Benay Pike, MD  Dulaglutide (TRULICITY) 3.90 ZE/0.9QZ SOPN Inject 0.75 mg into the skin once a week. Friday or saturday    [provider]  gabapentin (NEURONTIN) 400 MG capsule TAKE 1 CAPSULE BY MOUTH TWICE DAILY Patient taking differently: Take 400 mg by mouth 2 (two) times daily. 02/20/21   [name], MD  Incontinence Supply Disposable (DEPEND ADJUSTABLE UNDERWEAR LG) MISC 1 application by Does not apply route as needed. 08/09/20   [name], MD  Insulin Pen Needle (BD PEN NEEDLE NANO U/F) 32G X 4 MM MISC AS DIRECTED TWICE DAILY 02/24/21   [name], MD  isosorbide mononitrate (ISMO) 10 MG tablet Take 10 mg by mouth daily.    [provider]  Lancets [facility] ULTRASOFT) lancets USE AS DIRECTED 08/09/20   [name], MD  lidocaine (SALONPAS PAIN RELIEVING) 4 % Place 1 patch onto the skin daily as needed (pain).    [provider]  lidocaine-prilocaine (EMLA) cream Apply to port site at least 1 hour prior to use 05/19/22   Iruku, Arletha Pili, MD  metoCLOPramide (REGLAN) 10 MG tablet Take 1  tablet (10 mg total) by mouth every 6 (six) hours as needed for up to 20 days for nausea. 05/04/22 05/24/22  Fransico Meadow, MD  nitroGLYCERIN (NITROSTAT) 0.4 MG SL tablet PLACE 1 TABLET UNDER THE TONGUE EVERY 5 MINS AS NEEDED FOR CHEST PAIN Patient taking differently: Place 0.4 mg under the tongue every 5 (five) minutes as needed for chest pain. 04/08/21   [name], MD  ondansetron (ZOFRAN) 4 MG tablet Take 4 mg by mouth every 4 (four) hours as needed for nausea or vomiting.    [provider]   ondansetron (ZOFRAN) 8 MG tablet Take 1 tablet (8 mg total) by mouth every 8 (eight) hours as needed for nausea or vomiting. Start on third day after chemotherapy. 05/13/22   Benay Pike, MD  ONETOUCH VERIO test strip USE TO TEST BLOOD SUGAR 2 TO 3 TIMES A DAY 04/09/21   [name], MD  prochlorperazine (COMPAZINE) 10 MG tablet Take 1 tablet (10 mg total) by mouth every 6 (six) hours as needed for nausea or vomiting (Nausea or vomiting). 05/13/22   Benay Pike, MD  sucralfate (CARAFATE) 1 g tablet Take 1 tablet (1 g total) by mouth 4 (four) times daily -  with meals and at bedtime for 7 days. May use longer if needed. 05/15/22 05/22/22  Eugenie Filler, MD  pantoprazole (PROTONIX) 40 MG tablet Take 1 tablet (40 mg total) by mouth 2 (two) times daily for 56 days, THEN 1 tablet (40 mg total) daily. 05/15/22 05/15/22  Eugenie Filler, MD      Allergies    Lisinopril and Tape    Review of Systems   Review of Systems  Gastrointestinal:  Positive for abdominal pain, nausea and vomiting.  All other systems reviewed and are negative.   Physical Exam Updated Vital Signs BP (!) 144/84 (BP Location: Right Arm)   Pulse (!) 111   Temp 97.8 F (36.6 C) (Oral)   Resp 18   SpO2 97%  Physical Exam Vitals and nursing note reviewed.  Constitutional:      General: He is in acute distress.     Appearance: Normal appearance. He is ill-appearing.     Comments: Dishevelled, with soiled clothes, chronically ill-appearing  HENT:     Head: Normocephalic and atraumatic.  Eyes:     General:        Right eye: No discharge.        Left eye: No discharge.  Cardiovascular:     Rate and Rhythm: Regular rhythm. Tachycardia present.     Heart sounds: No murmur heard.    No friction rub. No gallop.     Comments: Intermittently tachycardic on my exam, with normal rhythm Pulmonary:     Effort: Pulmonary effort is normal.     Breath sounds: Normal breath sounds.  Abdominal:     General: Bowel  sounds are normal.     Palpations: Abdomen is soft.     Comments: TTP throughout, most focally in lower abdomen, no rebound, rigidity, guarding  Skin:    General: Skin is warm and dry.     Capillary Refill: Capillary refill takes less than 2 seconds.  Neurological:     Mental Status: He is alert and oriented to person, place, and time.  Psychiatric:        Mood and Affect: Mood normal.        Behavior: Behavior normal.     ED Results / Procedures / Treatments   Labs (all  labs ordered are listed, but only abnormal results are displayed) Labs Reviewed  CBC WITH DIFFERENTIAL/PLATELET - Abnormal; Notable for the following components:      Result Value   RBC 4.15 (*)    Hemoglobin 11.5 (*)    HCT 36.1 (*)    RDW 15.9 (*)    All other components within normal limits  COMPREHENSIVE METABOLIC PANEL - Abnormal; Notable for the following components:   Glucose, Bld 203 (*)    BUN 27 (*)    Creatinine, Ser 2.03 (*)    Albumin 3.4 (*)    ALT 51 (*)    GFR, Estimated 35 (*)    All other components within normal limits  LIPASE, BLOOD - Abnormal; Notable for the following components:   Lipase 62 (*)    All other components within normal limits  LACTIC ACID, PLASMA - Abnormal; Notable for the following components:   Lactic Acid, Venous 2.8 (*)    All other components within normal limits  MAGNESIUM  BRAIN NATRIURETIC PEPTIDE  LACTIC ACID, PLASMA  URINALYSIS, ROUTINE W REFLEX MICROSCOPIC    EKG None  Radiology No results found.  Procedures Procedures    Medications Ordered in ED Medications  0.9 %  sodium chloride infusion (has no administration in time range)  pantoprazole (PROTONIX) injection 40 mg (has no administration in time range)  sodium chloride 0.9 % bolus 1,000 mL (1,000 mLs Intravenous New Bag/Given 05/19/22 1314)  ondansetron (ZOFRAN) injection 4 mg (4 mg Intravenous Given 05/19/22 1314)  HYDROmorphone (DILAUDID) injection 1 mg (1 mg Intravenous Given 05/19/22  1316)  alum & mag hydroxide-simeth (MAALOX/MYLANTA) 200-200-20 MG/5ML suspension 30 mL (30 mLs Oral Given 05/19/22 1316)    And  lidocaine (XYLOCAINE) 2 % viscous mouth solution 15 mL (15 mLs Oral Given 05/19/22 1316)  HYDROmorphone (DILAUDID) injection 1 mg (1 mg Intravenous Given 05/19/22 1425)    ED Course/ Medical Decision Making/ A&P                           Medical Decision Making Amount and/or Complexity of Data Reviewed Labs: ordered. Radiology: ordered.  Risk OTC drugs. Prescription drug management.   This patient is a 71 y.o. male who presents to the ED for concern of nausea, vomiting, dehydration in context of metastatic merkel cell cancer, last chemo one week ago, this involves an extensive number of treatment options, and is a complaint that carries with it a high risk of complications and morbidity. The emergent differential diagnosis prior to evaluation includes, but is not limited to,  sepsis, acute dehydration, worsening esophagitis, worsening metastasis, new abdominal perforation, sepsis, vs chemo reaction vs other.   This is not an exhaustive differential.   Past Medical History / Co-morbidities / Social History: Previous stroke, heart failure, CAD, diabetes, CKD, merkel cell carcinoma with metastasis, esophagitis, grade D  Additional history: Chart reviewed. Pertinent results include: extensively reviewed outpatient oncology, gi notes, labwork, imaging, recent ED department labwork and imaging including CT scan from 1 week ago without acute finding to explain nausea, vomiting, but with evidence of concern for colonic metastasis  Physical Exam: Physical exam performed. The pertinent findings include: Chronically ill appearing patient who appears in some distress due to pain, intermittent nausea, vomiting, tender to palpation throughout the abdomen without any focal rebound, rigidity, guarding  Lab Tests: I ordered, and personally interpreted labs.  The pertinent  results include: CBC is overall unremarkable, patient with mild anemia, hemoglobin  11.5, no evidence of clinically significant leukocytosis.  Magnesium is unremarkable.  His lipase is minimally elevated at 62, CMP is notable for hyperglycemia, glucose 203, patient with evidence of AKI, creatinine 2.03, BUN 27, from baseline creatinine of around 1 in the last week, minimally elevated ALT.  We will obtain a BNP on this patient to stratify for heart failure given his current need for fluids given dehydration, lactic acid at 2.8, although patient with signs of clinical dehydration, no signs of heart failure exacerbation at this time. BNP normal today at 25.9.  Medications: I ordered medication including dilaudid, maalox/mylanta, viscous lidocaine, fluid bolus, protonix  for acute nausea, vomiting, esophagitis, dehydration. Patient will require repeat evaluation to assess his response to the above therapy and will need hospitalization for AKI, intractable nausea and vomiting.  Consultations Obtained: I requested consultation with the hospitalist, spoke with Dr. Marylyn Ishihara,  and discussed lab and imaging findings as well as pertinent plan - they recommend: admission   Disposition: After consideration of the diagnostic results and the patients response to treatment, I feel that patient would benefit from admission at this time .   I discussed this case with my attending physician Dr. Laverta Baltimore who cosigned this note including patient's presenting symptoms, physical exam, and planned diagnostics and interventions. Attending physician stated agreement with plan or made changes to plan which were implemented.    Final Clinical Impression(s) / ED Diagnoses Final diagnoses:  Dehydration  AKI (acute kidney injury) [facility])    Rx / DC Orders ED Discharge Orders     None         Anselmo Pickler, PA-C 05/19/22 1456    Anselmo Pickler, PA-C 05/19/22 1457    Margette Fast, MD 05/19/22 1505

## 2022-05-19 NOTE — Telephone Encounter (Signed)
Per MD review- may proceed with planned treatment with serum creatinine of 1.75

## 2022-05-19 NOTE — Progress Notes (Signed)
[place] NOTE  Patient Care Team: [name], MD as PCP - General (Family Medicine) Jerline Pain, MD as PCP - Cardiology (Cardiology) [facility] 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   He had PET/CT which showed enlarged FDG avid nodal metastasis identified in the right common iliac, external iliac and right inguinal node chains.  No signs to suggest distant nodal metastasis or solid organ metastasis.  Imaging after 3 cycles of Keytruda with progression hence we have discussed about doing carbo etoposide at 50% dose reduction given his poor performance status.  He adamantly wants to try some more treatment.  We have clearly discussed that chemotherapy has many toxicities including life-threatening and fatal toxicities.  He understands the common adverse effects of fatigue, nausea, vomiting, diarrhea, increased risk of infections etc.  Have dose reduced his carbidopa side to AUC of 3 and etoposide by 50%  Okay to proceed if labs are satisfactory today.  CBC reviewed and satisfactory.  I have called the daughter and discussed the plan with her.  He will be moving to the skilled nursing facility for a temporary time.   He should return to clinic in 1 week for follow-up.  No orders of the defined types were placed in this encounter. If he does not tolerate chemotherapy well, it is reasonable to consider hospice given his poor performance status.  HISTORY OF PRESENTING ILLNESS:  Antonio Guerra 71 y.o. male is here because of merkel cell carcinoma  This is a 71 year old male  patient who was evaluated for right groin pain, questionable boil or hernia.  CT pelvis demonstrated 4.7 x 5.2 x 6.6 cm nodular lobulated mass at the right inguinal region recommend a needle biopsy.  Ultrasound-guided core biopsy of the right inguinal mass demonstrated poorly differentiated carcinoma with neuroendocrine features suggestive of a Merkel cell carcinoma. PET CT 02/20/2022 Enlarged FDG avid nodal metastases identified within the right common iliac, right external iliac, and right inguinal nodal chains.There are no signs to suggest distant nodal metastasis or solid organ metastasis. Since he had locally advanced disease, we discussed about neoadjuvant immunotherapy based on some small group institutional experiences He is now post 2 cycles of immunotherapy Since his last visit he had reimaging which confirmed progression.  He was also recently in the hospital because of some nausea and vomiting, was thought to have severe gastritis after endoscopy.  He is very adamant that he wants to try more treatment.  In the hospital we have discussed about trying half dose of chemotherapy with carboplatin and etoposide followed by growth factor support.  Both daughter and patient wanted to proceed with that.  Today he reports pain in the leg and chronic pain in the back.  He tells me he still has some nausea and vomiting. He is now in a skilled nursing facility.  He denies any change in breathing.  No change in bowel habits or urinary habits. Rest of the pertinent 10 point ROS reviewed and negative  MEDICAL HISTORY:  Past Medical History:  Diagnosis Date   Altered mental status    Arthritis    "all over" (12/20/2015)   Chronic lower back  pain    Chronic systolic congestive heart failure ([place])    a. 04/2014 Echo: EF 20-25%.   Coronary artery disease    a. 2009/09/16 MI x 2 with PCI: stent x 2 (LAD and RI) @ [facility] in [place], [place];  b. 04/2014 Cath/PCI: LM 10-20, LAD 40p, 25m 870mSR(3.5x38 Xience  DES), 50apical, LCX 20 diffuse, RI 50p, 202mR, RCA 40-70m49md.   Fibromyalgia    Hyperlipidemia    Hypertension    Hypertensive urgency 09/27/2015   [facility] 2012right hand weaker since" (12/20/2015)   Type II diabetes mellitus (HCC)[place]  SURGICAL HISTORY: Past Surgical History:  Procedure Laterality Date   ANKLE FRACTURE SURGERY Right    BIOPSY  05/13/2022   Procedure: BIOPSY;  Surgeon: PyrtJerene Bears;  [facility];  Service: Gastroenterology;;  egd with biopsies   CARDIAC CATHETERIZATION N/A 09/30/2015   Procedure: Left Heart Cath and Coronary Angiography;  Surgeon: ChriBurnell Blanks;  [facility];  Service: Cardiovascular;  Laterality: N/A;   CORONARY ANGIOPLASTY WITH STENT PLACEMENT     "total of 3 stents" (12/20/2015)   ESOPHAGOGASTRODUODENOSCOPY (EGD) WITH PROPOFOL N/A 05/13/2022   Procedure: ESOPHAGOGASTRODUODENOSCOPY (EGD) WITH PROPOFOL;  Surgeon: PyrtJerene Bears;  [facility];  Service: Gastroenterology;  Laterality: N/A;   FLEXIBLE SIGMOIDOSCOPY N/A 05/13/2022   Procedure: FLEXIBLE SIGMOIDOSCOPY;  Surgeon: PyrtJerene Bears;  [facility];  Service: Gastroenterology;  Laterality: N/A;   FRACTIONAL FLOW RESERVE WIRE  05/11/2014   Procedure: FRACTIONAL FLOW RESERVE WIRE;  Surgeon: ChriBurnell Blanks;  [facility];  Service: Cardiovascular;;   FRACTURE SURGERY     LEFT HEART CATHETERIZATION WITH CORONARY/GRAFT ANGIOGRAM N/A 05/11/2014   Procedure: LEFT HEART CATHETERIZATION WITH COROBeatrix Fettersurgeon: ChriBurnell Blanks;  [facility];  Service: Cardiovascular;  Laterality: N/A;   PERCUTANEOUS CORONARY STENT INTERVENTION (PCI-S)  05/11/2014   Procedure: PERCUTANEOUS CORONARY STENT INTERVENTION (PCI-S);  Surgeon: ChriBurnell Blanks;  [facility];  Service: Cardiovascular;;    SOCIAL HISTORY: Social History    Socioeconomic History   Marital status: Married    Spouse name: Not on file   Number of children: Not on file   Years of education: Not on file   Highest education level: Not on file  Occupational History   Occupation: retired   Tobacco Use   Smoking status: Former   Smokeless tobacco: Never   Tobacco comments:    12/20/2015 "smoked cigarettes [id number]  Vaping Use   Vaping Use: Never used  Substance and Sexual Activity   Alcohol use: Not Currently    Alcohol/week: 5.0 standard drinks of alcohol    Types: 5 Cans of beer per week    Comment: stopped 4 yrs ago    Drug use: No   Sexual activity: Not Currently    Partners: Female  Other Topics Concern   Not on file  Social History Narrative   Current Social History        Who lives at home: lives with daughter MicheSharyn Lull2August 19, 2022ho would speak for you about health care matters: MicheSharyn Lull208/19/22ransportation: Has [employer] Transportation and daughter takes to appointments 8/4/208-19-2022rrent Stressors: wife and daughter passed away in 2022 2022/02/2822022-08-19rk / Education:  worked in the [employer]  02/20/2021   Other: limited mobility uses cane and walker and memory decline ( per daughter)  02/20/2021                                                                                                       Social Determinants of Health   Financial Resource Strain: Low Risk  (03/11/2021)   Overall Financial Resource Strain (CARDIA)    Difficulty of Paying Living Expenses: Not hard at all  Food Insecurity: No Food Insecurity (05/12/2022)   Hunger Vital Sign    Worried About Running Out of Food in the Last Year: Never true    Ran Out of Food in the Last Year: Never true  Transportation Needs: No Transportation Needs (05/12/2022)   PRAPARE - Hydrologist (Medical): No    Lack of Transportation (Non-Medical): No  Physical Activity: Inactive (03/11/2021)   Exercise Vital Sign    Days of  Exercise per Week: 0 days    Minutes of Exercise per Session: 0 min  Stress: Stress Concern Present (03/11/2021)   [place]    Feeling of Stress : Rather much  Social Connections: Socially Isolated (03/11/2021)   Social Connection and Isolation Panel [NHANES]    Frequency of Communication with Friends and Family: Once a week    Frequency of Social Gatherings with Friends and Family: Once a week    Attends Religious Services: Never    Marine scientist or Organizations: No    Attends Archivist Meetings: Never    Marital Status: Widowed  Intimate Partner Violence: Not At Risk (05/12/2022)   Humiliation, Afraid, Rape, and Kick questionnaire    Fear of Current or Ex-Partner: No    Emotionally Abused: No    Physically Abused: No    Sexually Abused: No    FAMILY HISTORY: Family History  Problem Relation Age of Onset   Diabetes Mother    CAD Neg Hx    Stomach cancer Neg Hx    Colon cancer Neg Hx    Esophageal cancer Neg Hx    Pancreatic cancer Neg Hx     ALLERGIES:  is allergic to lisinopril and tape.  MEDICATIONS:  Current Outpatient Medications  Medication Sig Dispense Refill   acetaminophen (TYLENOL) 650 MG CR tablet Take 650 mg by mouth every 8 (eight) hours as needed for pain.     alum & mag hydroxide-simeth (MAALOX/MYLANTA) 200-200-20 MG/5ML suspension Take 15 mLs by mouth every 4 (four) hours as needed for indigestion or heartburn.     atorvastatin (LIPITOR) 80 MG tablet TAKE 1 TABLET(80 MG) BY MOUTH AT BEDTIME (Patient taking differently: Take 80 mg by mouth at bedtime.) 90 tablet 3   Blood Glucose Monitoring Suppl (ONETOUCH VERIO) w/Device KIT 30 Units by Does not apply route daily. 1 kit 0   carvedilol (COREG) 12.5 MG tablet Take 12.5 mg by mouth 2 (two) times daily with a meal.     clopidogrel (PLAVIX) 75 MG tablet TAKE 1 TABLET(75 MG) BY MOUTH  DAILY (Patient taking differently: Take 75 mg  by mouth daily.) 90 tablet 2   Continuous Blood Gluc Receiver (FREESTYLE LIBRE 14 DAY READER) DEVI 1 Device by Does not apply route every morning. 1 each 3   cycloSPORINE (RESTASIS) 0.05 % ophthalmic emulsion Place 1 drop into both eyes 2 (two) times daily.     dexamethasone (DECADRON) 4 MG tablet Take 2 tablets daily for 2 days, on days 4 and 5.Take with food. Every 21 days. 30 tablet 1   Dulaglutide (TRULICITY) 1.19 ER/7.4YC SOPN Inject 0.75 mg into the skin once a week. Friday or saturday     gabapentin (NEURONTIN) 400 MG capsule TAKE 1 CAPSULE BY MOUTH TWICE DAILY (Patient taking differently: Take 400 mg by mouth 2 (two) times daily.) 60 capsule 3   Incontinence Supply Disposable (DEPEND ADJUSTABLE UNDERWEAR LG) MISC 1 application by Does not apply route as needed. 32 each 3   Insulin Pen Needle (BD PEN NEEDLE NANO U/F) 32G X 4 MM MISC AS DIRECTED TWICE DAILY 100 each 3   isosorbide mononitrate (ISMO) 10 MG tablet Take 10 mg by mouth daily.     Lancets (ONETOUCH ULTRASOFT) lancets USE AS DIRECTED 100 each 0   lidocaine (SALONPAS PAIN RELIEVING) 4 % Place 1 patch onto the skin daily as needed (pain).     lidocaine-prilocaine (EMLA) cream Apply to port site at least 1 hour prior to use 30 g 3   metoCLOPramide (REGLAN) 10 MG tablet Take 1 tablet (10 mg total) by mouth every 6 (six) hours as needed for up to 20 days for nausea. 20 tablet 0   nitroGLYCERIN (NITROSTAT) 0.4 MG SL tablet PLACE 1 TABLET UNDER THE TONGUE EVERY 5 MINS AS NEEDED FOR CHEST PAIN (Patient taking differently: Place 0.4 mg under the tongue every 5 (five) minutes as needed for chest pain.) 25 tablet 3   ondansetron (ZOFRAN) 4 MG tablet Take 4 mg by mouth every 4 (four) hours as needed for nausea or vomiting.     ondansetron (ZOFRAN) 8 MG tablet Take 1 tablet (8 mg total) by mouth every 8 (eight) hours as needed for nausea or vomiting. Start on third day after chemotherapy. 30 tablet 1   ONETOUCH VERIO test strip USE TO TEST BLOOD  SUGAR 2 TO 3 TIMES A DAY 100 strip 2   prochlorperazine (COMPAZINE) 10 MG tablet Take 1 tablet (10 mg total) by mouth every 6 (six) hours as needed for nausea or vomiting (Nausea or vomiting). 30 tablet 1   sucralfate (CARAFATE) 1 g tablet Take 1 tablet (1 g total) by mouth 4 (four) times daily -  with meals and at bedtime for 7 days. May use longer if needed. 28 tablet 0   No current facility-administered medications for this visit.     PHYSICAL EXAMINATION: ECOG PERFORMANCE STATUS: 0 - Asymptomatic  Vitals:   05/19/22 0921  BP: 98/64  Pulse: 90  Resp: 16  Temp: 97.8 F (36.6 C)  SpO2: 98%   Filed Weights    Physical Exam Constitutional:      General: He is not in acute distress.    Appearance: He is ill-appearing (He appears chronically ill, no acute distress).  HENT:     Mouth/Throat:     Comments: Halitosis Abdominal:     Comments: Large mass in the right groin but exam limited because patient cant move much on his own  Musculoskeletal:        General: No swelling.  Cervical back: Normal range of motion and neck supple. No rigidity.  Lymphadenopathy:     Cervical: No cervical adenopathy.  Neurological:     Mental Status: He is alert.     LABORATORY DATA:  I have reviewed the data as listed Lab Results  Component Value Date   WBC 5.5 05/19/2022   HGB 11.2 (L) 05/19/2022   HCT 33.3 (L) 05/19/2022   MCV 82.8 05/19/2022   PLT 354 05/19/2022     Chemistry      Component Value Date/Time   NA 136 05/15/2022 0351   NA 143 08/13/2020 1442   K 3.9 05/15/2022 0351   CL 106 05/15/2022 0351   CO2 25 05/15/2022 0351   BUN 11 05/15/2022 0351   BUN 19 08/13/2020 1442   CREATININE 0.99 05/15/2022 0351   CREATININE 1.37 (H) 04/21/2022 1406   CREATININE 1.35 (H) 11/25/2015 1701      Component Value Date/Time   CALCIUM 8.8 (L) 05/15/2022 0351   ALKPHOS 70 05/14/2022 0346   AST 23 05/14/2022 0346   AST 30 04/21/2022 1406   ALT 28 05/14/2022 0346   ALT 18  04/21/2022 1406   BILITOT 0.8 05/14/2022 0346   BILITOT 0.6 04/21/2022 1406       RADIOGRAPHIC STUDIES: I have personally reviewed the radiological images as listed and agreed with the findings in the report. CT ABDOMEN PELVIS WO CONTRAST  Result Date: 05/11/2022 CLINICAL DATA:  Abdominal pain with nausea and vomiting. EXAM: CT ABDOMEN AND PELVIS WITHOUT CONTRAST TECHNIQUE: Multidetector CT imaging of the abdomen and pelvis was performed following the standard protocol without IV contrast. RADIATION DOSE REDUCTION: This exam was performed according to the departmental dose-optimization program which includes automated exposure control, adjustment of the mA and/or kV according to patient size and/or use of iterative reconstruction technique. COMPARISON:  May 04, 2022 FINDINGS: Lower chest: Mild atelectatic changes are seen within the right middle lobe and bilateral lung bases. Hepatobiliary: No focal liver abnormality is seen. No gallstones, gallbladder wall thickening, or biliary dilatation. Pancreas: Numerous tiny parenchymal calcifications are seen scattered throughout the pancreas. There is no evidence of pancreatic ductal dilatation or surrounding inflammatory fat stranding. Spleen: Normal in size without focal abnormality. Adrenals/Urinary Tract: Adrenal glands are unremarkable. Kidneys are normal in size, without obstructing renal calculi or hydronephrosis. 2 mm nonobstructing renal calculi are seen within both kidneys. Mild focal cortical scarring is seen along the posterolateral aspect of the mid to lower left kidney. Bladder is unremarkable. Stomach/Bowel: There is a small, stable hiatal hernia. Appendix appears normal. Stool is seen throughout the large bowel. No evidence of bowel dilatation. Mild, asymmetric anterior and right lateral rectal wall thickening is seen (axial CT images 86 through 88, CT series 2). Vascular/Lymphatic: Aortic atherosclerosis. Multiple stable enlarged right  pelvic and right inguinal lymph nodes are seen (the largest measuring approximately 3.3 cm x 2.8 cm), with a stable 7.3 cm x 7.5 cm necrotic right inguinal mass. Reproductive: The prostate gland is mildly enlarged and unchanged in size. Other: A stable 16 mm x 11 mm fat containing umbilical hernia is seen. No abdominopelvic ascites. Musculoskeletal: No acute or significant osseous findings. IMPRESSION: 1. Mild, asymmetric anterior and right lateral rectal wall thickening, as described above. While this may be, in part, secondary to underdistention, sequelae associated with an underlying neoplastic process cannot be excluded. Correlation with colonoscopy is recommended. 2. Stable right pelvic and right inguinal lymphadenopathy with a stable 7.3 cm x 7.5 cm necrotic right  inguinal mass. 3. Small, stable hiatal hernia. 4. Bilateral 2 mm nonobstructing renal calculi. 5. Aortic atherosclerosis. Aortic Atherosclerosis (ICD10-I70.0). Electronically Signed   By: Virgina Norfolk M.D.   On: 05/11/2022 21:16   NM PET Image Restag (PS) Skull Base To Thigh  Result Date: 05/10/2022 CLINICAL DATA:  Subsequent treatment strategy for history of Merkel cell, assess response to therapy. EXAM: NUCLEAR MEDICINE PET SKULL BASE TO THIGH TECHNIQUE: 9.2 mCi F-18 FDG was injected intravenously. Full-ring PET imaging was performed from the skull base to thigh after the radiotracer. CT data was obtained and used for attenuation correction and anatomic localization. Fasting blood glucose: 123 mg/dl COMPARISON:  Previous PET imaging from February 20, 2022. FINDINGS: Mediastinal blood pool activity: SUV max 2.86 Liver activity: SUV max NA NECK: No hypermetabolic lymph nodes in the neck. Incidental CT findings: None. CHEST: No hypermetabolic mediastinal or hilar nodes. No suspicious pulmonary nodules on the CT scan. Incidental CT findings: Extensive coronary artery disease most notably on the LEFT. No pericardial effusion. Aortic  atherosclerosis. No adenopathy by size criteria in the chest. Basilar atelectasis. Airways are patent. ABDOMEN/PELVIS: Increasing size of RIGHT pelvic adenopathy and RIGHT groin mass. New RIGHT pelvic sidewall adenopathy. Mass in the RIGHT groin measuring 7.6 x 7.7 cm previously 7.6 x 6.8 cm now within maximum SUV of 14.65 previously 22.08. Some signs of central necrosis. Adjacent lymph nodes also with increase in size and comparison references are made to the August 4 PET examination. Nodes are stable compared to recent imaging from October of 2023. Similarly hypermetabolic lymph nodes which are newly enlarged (image 184/4) 14 mm as compared to 10 mm size. New RIGHT pelvic sidewall adenopathy (image 180/4) 22 mm short axis showing a maximum SUV of 14.50. External iliac adenopathy enlarged since previous imaging 23 mm (image 183/4) previously 12 mm maximum SUV on today's study of 7.45 as compared to 6.8. Large RIGHT external iliac lymph node (image 177/4) 2.7 as compared to 2.2 cm greatest axial dimension similar FDG uptake as seen similarly FDG avid to the adjacent RIGHT external iliac lymph node. Previously at 10.09. Enlarging RIGHT common iliac lymph node (image 155/4) 16 mm short axis previously 5 mm showing a maximum SUV of 10.2 as compared to 4.96. Similar enlargement and increase in metabolic activity associated with an adjacent RIGHT internal-external iliac bifurcation node 163/4. No lower abdominal retroperitoneal adenopathy or solid organ disease. Incidental CT findings: Aortic atherosclerosis. No acute findings in the abdomen. Decreased distension of small bowel since previous imaging. SKELETON: No focal hypermetabolic activity to suggest skeletal metastasis. Small focus of soft tissue density in the LEFT lower quadrant in the inguinal region associated with the dermis measures 14 mm previously approximately 14 mm with minimal FDG uptake which is not substantially changed. Incidental CT findings: None.  IMPRESSION: 1. Increasing size and increase in size of RIGHT pelvic adenopathy and RIGHT groin mass. Dominant mass does show measurable interval decrease in FDG uptake and some central necrosis. 2. New RIGHT pelvic sidewall adenopathy with increasing size of and increasing metabolic activity associated with RIGHT common and upper external iliac adenopathy at the internal-external bifurcation. 3. Increasing size of an hypermetabolic changes related to lymph nodes adjacent to the dominant RIGHT groin mass. 4. No hypermetabolic abdominal retroperitoneal adenopathy or other evidence of distant metastatic disease. 5. Skin lesion in the LEFT lower quadrant of uncertain significance is unchanged. 6. Decreased distension of small bowel since previous imaging. 7. Extensive coronary artery disease most notably on the LEFT.  8. Aortic atherosclerosis. Aortic Atherosclerosis (ICD10-I70.0). Electronically Signed   By: Zetta Bills M.D.   On: 05/10/2022 19:50   CT ABDOMEN PELVIS W CONTRAST  Result Date: 05/04/2022 CLINICAL DATA:  NAUSEA, VOMITING. ABDOMINAL PAIN SINCE LAST CHEMOTHERAPY 2 WEEKS AGO. EXAM: CT ABDOMEN AND PELVIS WITH CONTRAST TECHNIQUE: Multidetector CT imaging of the abdomen and pelvis was performed using the standard protocol following bolus administration of intravenous contrast. RADIATION DOSE REDUCTION: This exam was performed according to the departmental dose-optimization program which includes automated exposure control, adjustment of the mA and/or kV according to patient size and/or use of iterative reconstruction technique. CONTRAST:  14m OMNIPAQUE IOHEXOL 300 MG/ML  SOLN COMPARISON:  None Available. FINDINGS: Lower chest: No acute abnormality.  Bibasilar dependent atelectasis. Hepatobiliary: No focal liver abnormality is seen. No gallstones, gallbladder wall thickening, or biliary dilatation. Pancreas: Mild pancreatic atrophy. No pancreatic ductal dilatation or surrounding inflammatory changes.  Spleen: Normal in size without focal abnormality. Adrenals/Urinary Tract: Adrenal glands are unremarkable. Kidneys are normal, without renal calculi, focal lesion, or hydronephrosis. Small hypodense cysts in the lower pole of the left kidney. Bladder is unremarkable. Stomach/Bowel: Small hiatal hernia. Stomach is within normal limits. Appendix appears normal. There are multiple fluid-filled distended small bowel loops without evidence of obstruction, similar to prior examination. Vascular/Lymphatic: Aortic atherosclerosis. Right inguinal region necrotic mass measuring approximately 7.5 x 7.0 cm, not significantly changed. Another right inguinal lymph node along the right external iliac vessels measuring approximately 3.0 x 2.5 cm, also not significantly changed. Reproductive: Mildly enlarged prostate. Other: No abdominal wall hernia or abnormality. No abdominopelvic ascites. Musculoskeletal: No acute or significant osseous findings. IMPRESSION: 1. Multiple fluid-filled distended small bowel loops without evidence of obstruction, similar to prior examination, likely suggesting ileus. 2. Right inguinal region necrotic mass measuring approximately 7.5 x 7.0 cm, not significantly changed. Another right inguinal lymph node along the right external iliac vessels measuring approximately 3.0 x 2.5 cm, also not significantly changed. 3. Small hiatal hernia. 4. Aortic atherosclerosis. 5. Mildly enlarged prostate. Electronically Signed   By: IKeane PoliceD.O.   On: 05/04/2022 12:37   CT ABDOMEN PELVIS W CONTRAST  Result Date: 04/26/2022 CLINICAL DATA:  Epigastric pain. Pt is being treated for cancer, abd pain .vomiting 3 days. Merkel cell carcinoma. EXAM: CT ABDOMEN AND PELVIS WITH CONTRAST TECHNIQUE: Multidetector CT imaging of the abdomen and pelvis was performed using the standard protocol following bolus administration of intravenous contrast. RADIATION DOSE REDUCTION: This exam was performed according to the  departmental dose-optimization program which includes automated exposure control, adjustment of the mA and/or kV according to patient size and/or use of iterative reconstruction technique. CONTRAST:  <See Chart> OMNIPAQUE IOHEXOL 300 MG/ML SOLN, 853mOMNIPAQUE IOHEXOL 300 MG/ML SOLN COMPARISON:  Head CT 02/20/2022 FINDINGS: Lower chest: Limited evaluation due to respiratory motion artifact. Question peribronchovascular ground-glass airspace opacities that are poorly visualized. Patulous distal esophagus with distal circumferential esophageal wall thickening. Hepatobiliary: No focal liver abnormality. No gallstones, gallbladder wall thickening, or pericholecystic fluid. No biliary dilatation. Pancreas: No focal lesion. Normal pancreatic contour. No surrounding inflammatory changes. No main pancreatic ductal dilatation. Spleen: Normal in size without focal abnormality. Adrenals/Urinary Tract: No adrenal nodule bilaterally. Bilateral kidneys enhance symmetrically. There is a 1.2 cm left inferior renal pole complex cystic lesion. No hydronephrosis. No hydroureter. The urinary bladder is unremarkable. On delayed imaging, there is no urothelial wall thickening and there are no filling defects in the opacified portions of the bilateral collecting systems or ureters. Stomach/Bowel: There  is gastric wall thickening along the gastroesophageal junction. Several loops of small bowel are distended with fluid. No pneumatosis. No evidence of bowel wall thickening or dilatation. Appendix appears normal. Vascular/Lymphatic: No abdominal aorta or iliac aneurysm. Severe atherosclerotic plaque of the aorta and its branches. Interval increase in size of a 7.6 x 7.7 cm centrally necrotic right inguinal mass (2:93). Interval increase in size of a right [place] lymph node measuring 3.7 x 3.1 cm (from 2.2 x 3.1 cm). Interval increase in size of right iliac lymph nodes with as an example a 1.8 cm (from 0.7 cm) lymph node. Reproductive: The  prostate is enlarged measuring up to 5 cm. Other: No intraperitoneal free fluid. No intraperitoneal free gas. No organized fluid collection. Musculoskeletal: No abdominal wall hernia or abnormality. No suspicious lytic or blastic osseous lesions. No acute displaced fracture. Multilevel degenerative changes of the spine. IMPRESSION: 1. Question peribronchovascular ground-glass airspace opacities that are poorly visualized due to respiratory motion artifact. 2. Distal circumferential esophageal wall thickening as well as gastroesophageal junction wall thickening. Recommend direct visualization/biopsy for further evaluation. 3. Interval increase in size of right pelvic and inguinal lymphadenopathy in a patient with known malignancy. 4. A complex 1.2 cm left inferior renal pole lesion is incompletely evaluated on this study. When the patient is clinically stable and able to follow directions and hold their breath (preferably as an outpatient) further evaluation with dedicated nonemergent abdominal MRI renal protocol should be considered. 5.  Aortic Atherosclerosis (ICD10-I70.0). Electronically Signed   By: Iven Finn M.D.   On: 04/26/2022 21:17   DG Chest 2 View  Result Date: 04/26/2022 CLINICAL DATA:  Vomiting. Chemotherapy reaction with vomiting for 3 days. History of skin cancer. EXAM: CHEST - 2 VIEW COMPARISON:  03/05/2020 FINDINGS: The heart size and mediastinal contours are within normal limits. Both lungs are clear. The visualized skeletal structures are unremarkable. IMPRESSION: No active cardiopulmonary disease. Electronically Signed   By: Lucienne Capers M.D.   On: 04/26/2022 19:09   MYOCARDIAL PERFUSION IMAGING  Result Date: 04/21/2022   Findings are consistent with no prior ischemia and no prior myocardial infarction. The study is high risk. Based on estimated EF   No ST deviation was noted.   There is no evidence of ischemia. There is no evidence of infarction.   Left ventricular function is  abnormal. Global function is severely reduced. Nuclear stress EF: 34 %. The left ventricular ejection fraction is moderately decreased (30-44%). End diastolic cavity size is normal. End systolic cavity size is mildly enlarged.   Prior study not available for comparison. Normal perfusion Global hypokinesis abnormal septal motion estimated EF 34% Suggest echo/MRI correlation    All questions were answered. The patient knows to call the clinic with any problems, questions or concerns. I spent 30 minutes in the care of this patient including H and P, review of records, counseling and coordination of care.  I called his daughter and discussed the plan again today as well.  They agree that they want to try at least 1 cycle of treatment if he does not do well, will consider hospice.    Benay Pike, MD 05/19/2022 9:45 AM

## 2022-05-19 NOTE — ED Notes (Signed)
[facility] called to give report on pt arriving from [place].

## 2022-05-19 NOTE — ED Notes (Signed)
ED TO INPATIENT HANDOFF REPORT  ED Nurse Name and Phone #:   S Name/Age/Gender Antonio Guerra 71 y.o. male Room/Bed: WA14/WA14  Code Status   Code Status: Prior  Home/SNF/Other Home Patient oriented to: self, place, time, and situation Is this baseline? Yes   Triage Complete: Triage complete  Chief Complaint Intractable nausea and vomiting [R11.2] Ileus ([place]) [K56.7]  Triage Note Patient stated he cannot stop vomiting for a month. Last chemo treatment was last week.   Allergies Allergies  Allergen Reactions   Lisinopril Other (See Comments)    Caused pt to have chest pains.    Tape Itching and Rash    Level of Care/Admitting Diagnosis ED Disposition     ED Disposition  Admit   Condition  --   Comment  Hospital Area: [facility] [[id number]]  Level of Care: Telemetry [5]  Admit to tele based on following criteria: Monitor for Ischemic changes  May admit patient to [facility] or [facility] if equivalent level of care is available:: No  Covid Evaluation: Asymptomatic - no recent exposure (last 10 days) testing not required  Diagnosis: Ileus [name]) [[id number]]  Admitting Physician: Jonnie Finner [[id number]]  Attending Physician: Jonnie Finner [[id number]]  Certification:: I certify this patient will need inpatient services for at least 2 midnights  Estimated Length of Stay: 2          B Medical/Surgery History Past Medical History:  Diagnosis Date   Altered mental status    Arthritis    "all over" (12/20/2015)   Chronic lower back pain    Chronic systolic congestive heart failure ([place])    a. 04/2014 Echo: EF 20-25%.   Coronary artery disease    a. 2011 MI x 2 with PCI: stent x 2 (LAD and RI) @ [facility] in [place], [place];  b. 04/2014 Cath/PCI: LM 10-20, LAD 40p, 39m 852mSR(3.5x38 Xience DES), 50apical, LCX 20 diffuse, RI 50p, 2063mR, RCA 40-10m49md.   Fibromyalgia    Hyperlipidemia    Hypertension    Hypertensive urgency 09/27/2015    [facility]   [facility]   "right hand weaker since" (12/20/2015)   [facility] Past Surgical History:  Procedure Laterality Date   ANKLE FRACTURE SURGERY Right    BIOPSY  05/13/2022   Procedure: BIOPSY;  Surgeon: PyrtJerene Bears;  [facility]: [facility];  Service: Gastroenterology;;  egd with biopsies   CARDIAC CATHETERIZATION N/A 09/30/2015   Procedure: Left Heart Cath and Coronary Angiography;  Surgeon: ChriBurnell Blanks;  [facility];  Service: Cardiovascular;  Laterality: N/A;   CORONARY ANGIOPLASTY WITH STENT PLACEMENT     "total of 3 stents" (12/20/2015)   ESOPHAGOGASTRODUODENOSCOPY (EGD) WITH PROPOFOL N/A 05/13/2022   Procedure: ESOPHAGOGASTRODUODENOSCOPY (EGD) WITH PROPOFOL;  Surgeon: PyrtJerene Bears;  [facility];  Service: Gastroenterology;  Laterality: N/A;   FLEXIBLE SIGMOIDOSCOPY N/A 05/13/2022   Procedure: FLEXIBLE SIGMOIDOSCOPY;  Surgeon: PyrtJerene Bears;  [facility];  Service: Gastroenterology;  Laterality: N/A;   FRACTIONAL FLOW RESERVE WIRE  05/11/2014   Procedure: FRACTIONAL FLOW RESERVE WIRE;  Surgeon: ChriBurnell Blanks;  [facility];  Service: Cardiovascular;;   FRACTURE SURGERY     LEFT HEART CATHETERIZATION WITH CORONARY/GRAFT ANGIOGRAM N/A 05/11/2014   Procedure: LEFT HEART CATHETERIZATION WITH COROBeatrix Fettersurgeon: ChriBurnell Blanks;  [facility];  Service:  Cardiovascular;  Laterality: N/A;   PERCUTANEOUS CORONARY STENT INTERVENTION (PCI-S)  05/11/2014   Procedure: PERCUTANEOUS CORONARY STENT INTERVENTION (PCI-S);  Surgeon: Burnell Blanks, MD;  [facility];  Service: Cardiovascular;;     A IV Location/Drains/Wounds Patient Lines/Drains/Airways Status     Active Line/Drains/Airways     Name Placement date Placement time Site Days   Peripheral IV 05/19/22 20 G Left Antecubital 05/19/22  1312   Antecubital  less than 1   Pressure Injury 05/12/22 Buttocks Left 05/12/22  2000  -- 7            Intake/Output Last 24 hours No intake or output data in the 24 hours ending 05/19/22 1648  Labs/Imaging Results for orders placed or performed during the hospital encounter of 05/19/22 (from the past 48 hour(s))  CBC with Differential     Status: Abnormal   Collection Time: 05/19/22  1:19 PM  Result Value Ref Range   WBC 5.0 4.0 - 10.5 K/uL   RBC 4.15 (L) 4.22 - 5.81 MIL/uL   Hemoglobin 11.5 (L) 13.0 - 17.0 g/dL   HCT 36.1 (L) 39.0 - 52.0 %   MCV 87.0 80.0 - 100.0 fL   MCH 27.7 26.0 - 34.0 pg   MCHC 31.9 30.0 - 36.0 g/dL   RDW 15.9 (H) 11.5 - 15.5 %   Platelets 320 150 - 400 K/uL   nRBC 0.0 0.0 - 0.2 %   Neutrophils Relative % 69 %   Neutro Abs 3.4 1.7 - 7.7 K/uL   Lymphocytes Relative 23 %   Lymphs Abs 1.2 0.7 - 4.0 K/uL   Monocytes Relative 7 %   Monocytes Absolute 0.4 0.1 - 1.0 K/uL   Eosinophils Relative 0 %   Eosinophils Absolute 0.0 0.0 - 0.5 K/uL   Basophils Relative 0 %   Basophils Absolute 0.0 0.0 - 0.1 K/uL   Immature Granulocytes 1 %   Abs Immature Granulocytes 0.03 0.00 - 0.07 K/uL    Comment: Performed at [facility], [place] [address], [place], [place] [address]  Comprehensive metabolic panel     Status: Abnormal   Collection Time: 05/19/22  1:19 PM  Result Value Ref Range   Sodium 139 135 - 145 mmol/L   Potassium 4.2 3.5 - 5.1 mmol/L   Chloride 105 98 - 111 mmol/L   CO2 24 22 - 32 mmol/L   Glucose, Bld 203 (H) 70 - 99 mg/dL    Comment: Glucose reference range applies only to samples taken after fasting for at least 8 hours.   BUN 27 (H) 8 - 23 mg/dL   Creatinine, Ser 2.03 (H) 0.61 - 1.24 mg/dL   Calcium 9.1 8.9 - 10.3 mg/dL   Total Protein 7.8 6.5 - 8.1 g/dL   Albumin 3.4 (L) 3.5 - 5.0 g/dL   AST 39 15 - 41 U/L   ALT 51 (H) 0 - 44 U/L   Alkaline Phosphatase 86 38 - 126 U/L   Total Bilirubin 1.0 0.3 - 1.2 mg/dL   GFR, Estimated 35  (L) >60 mL/min    Comment: (NOTE) Calculated using the CKD-EPI Creatinine Equation (2021)    Anion gap 10 5 - 15    Comment: Performed at [facility], [place] [address] [place], [place] [address]  Lipase, blood     Status: Abnormal   Collection Time: 05/19/22  1:19 PM  Result Value Ref Range   Lipase 62 (H) 11 - 51 U/L  Comment: Performed at [facility], [place] [address] [place], [place] [address]  Magnesium     Status: None   Collection Time: 05/19/22  1:19 PM  Result Value Ref Range   Magnesium 1.9 1.7 - 2.4 mg/dL    Comment: Performed at [facility], [place] [address] [place], [place] [address]  Lactic acid, plasma     Status: Abnormal   Collection Time: 05/19/22  1:19 PM  Result Value Ref Range   Lactic Acid, Venous 2.8 (HH) 0.5 - 1.9 mmol/L    Comment: CRITICAL RESULT CALLED TO, READ BACK BY AND VERIFIED WITH DEVOLT T. RN. '@1416'$  ON 05/19/22 BY KERLANDIA C. Performed at [facility], [place] [address] [place], [place] [address]   Brain natriuretic peptide     Status: None   Collection Time: 05/19/22  1:24 PM  Result Value Ref Range   B Natriuretic Peptide 25.9 0.0 - 100.0 pg/mL    Comment: Performed at [facility], [place] [address], [place],  [address]   DG Abd 1 View  Result Date: 05/19/2022 CLINICAL DATA:  Vomiting. EXAM: ABDOMEN - 1 VIEW COMPARISON:  None Available. FINDINGS: Dilated colon is noted with moderate amount of stool present. No small bowel dilatation is noted. Vascular calcifications are noted. IMPRESSION: Dilated colon most consistent with ileus. Small bowel dilatation is noted. Moderate stool burden. Electronically Signed   By: Marijo Conception M.D.   On: 05/19/2022 15:36   DG Chest 1 View  Result Date: 05/19/2022 CLINICAL DATA:  Shortness of breath.  Vomiting. EXAM: CHEST  1 VIEW COMPARISON:  04/26/2022 FINDINGS: The cardiomediastinal contours are normal. Minor  atelectasis in the lung bases. Pulmonary vasculature is normal. No consolidation, pleural effusion, or pneumothorax. No acute osseous abnormalities are seen. IMPRESSION: Minor bibasilar atelectasis. Electronically Signed   By: [place] Rake M.D.   On: 05/19/2022 15:05    Pending Labs Unresulted Labs (From admission, onward)     Start     Ordered   05/19/22 1245  Lactic acid, plasma  Now then every 2 hours,   R      05/19/22 1244   05/19/22 1245  Urinalysis, Routine w reflex microscopic  Once,   URGENT        05/19/22 1244            Vitals/Pain Today's Vitals   05/19/22 1445 05/19/22 1500 05/19/22 1515 05/19/22 1530  BP: 133/79 139/81 (!) 144/85 (!) 124/99  Pulse: (!) 109 (!) 113 (!) 113 (!) 119  Resp:  20  20  Temp:      TempSrc:      SpO2: 94% 94% 94% 94%  PainSc:        Isolation Precautions No active isolations  Medications Medications  prochlorperazine (COMPAZINE) injection 10 mg (has no administration in time range)  fentaNYL (SUBLIMAZE) injection 50 mcg (50 mcg Intravenous Given 05/19/22 1531)  0.9 %  sodium chloride infusion ( Intravenous Not Given 05/19/22 1627)  pantoprazole (PROTONIX) injection 40 mg (has no administration in time range)  sodium chloride 0.9 % bolus 1,000 mL (1,000 mLs Intravenous New Bag/Given 05/19/22 1314)  ondansetron (ZOFRAN) injection 4 mg (4 mg Intravenous Given 05/19/22 1314)  HYDROmorphone (DILAUDID) injection 1 mg (1 mg Intravenous Given 05/19/22 1316)  alum & mag hydroxide-simeth (MAALOX/MYLANTA) 200-200-20 MG/5ML suspension 30 mL (30 mLs Oral Given 05/19/22 1316)    And  lidocaine (XYLOCAINE) 2 % viscous mouth solution 15 mL (15 mLs  Oral Given 05/19/22 1316)  HYDROmorphone (DILAUDID) injection 1 mg (1 mg Intravenous Given 05/19/22 1425)  0.9 %  sodium chloride infusion ( Intravenous New Bag/Given 05/19/22 1525)  pantoprazole (PROTONIX) injection 40 mg (40 mg Intravenous Given 05/19/22 1531)    Mobility walks with  device High fall risk   Focused Assessments    R Recommendations: See Admitting Provider Note  Report given to:   Additional Notes:

## 2022-05-19 NOTE — Progress Notes (Signed)
zofran

## 2022-05-20 ENCOUNTER — Ambulatory Visit: Payer: Medicare (Managed Care)

## 2022-05-20 ENCOUNTER — Telehealth: Payer: Self-pay | Admitting: Physician Assistant

## 2022-05-20 ENCOUNTER — Inpatient Hospital Stay (HOSPITAL_COMMUNITY): Payer: Medicare (Managed Care)

## 2022-05-20 ENCOUNTER — Encounter: Payer: Self-pay | Admitting: Hematology and Oncology

## 2022-05-20 DIAGNOSIS — K208 Other esophagitis without bleeding: Secondary | ICD-10-CM | POA: Diagnosis not present

## 2022-05-20 DIAGNOSIS — K567 Ileus, unspecified: Secondary | ICD-10-CM

## 2022-05-20 DIAGNOSIS — R112 Nausea with vomiting, unspecified: Secondary | ICD-10-CM | POA: Diagnosis not present

## 2022-05-20 DIAGNOSIS — C4A9 Merkel cell carcinoma, unspecified: Secondary | ICD-10-CM | POA: Diagnosis not present

## 2022-05-20 LAB — URINALYSIS, ROUTINE W REFLEX MICROSCOPIC
Bacteria, UA: NONE SEEN
Bilirubin Urine: NEGATIVE
Glucose, UA: NEGATIVE mg/dL
Hgb urine dipstick: NEGATIVE
Ketones, ur: NEGATIVE mg/dL
Leukocytes,Ua: NEGATIVE
Nitrite: NEGATIVE
Protein, ur: 30 mg/dL — AB
Specific Gravity, Urine: 1.023 (ref 1.005–1.030)
pH: 5 (ref 5.0–8.0)

## 2022-05-20 LAB — COMPREHENSIVE METABOLIC PANEL
ALT: 34 U/L (ref 0–44)
AST: 22 U/L (ref 15–41)
Albumin: 3 g/dL — ABNORMAL LOW (ref 3.5–5.0)
Alkaline Phosphatase: 72 U/L (ref 38–126)
Anion gap: 6 (ref 5–15)
BUN: 27 mg/dL — ABNORMAL HIGH (ref 8–23)
CO2: 23 mmol/L (ref 22–32)
Calcium: 8.1 mg/dL — ABNORMAL LOW (ref 8.9–10.3)
Chloride: 110 mmol/L (ref 98–111)
Creatinine, Ser: 1.61 mg/dL — ABNORMAL HIGH (ref 0.61–1.24)
GFR, Estimated: 46 mL/min — ABNORMAL LOW (ref >60)
Glucose, Bld: 153 mg/dL — ABNORMAL HIGH (ref 70–99)
Potassium: 3.8 mmol/L (ref 3.5–5.1)
Sodium: 139 mmol/L (ref 135–145)
Total Bilirubin: 1.1 mg/dL (ref 0.3–1.2)
Total Protein: 6.4 g/dL — ABNORMAL LOW (ref 6.5–8.1)

## 2022-05-20 LAB — GLUCOSE, CAPILLARY
Glucose-Capillary: 140 mg/dL — ABNORMAL HIGH (ref 70–99)
Glucose-Capillary: 119 mg/dL — ABNORMAL HIGH (ref 70–99)
Glucose-Capillary: 100 mg/dL — ABNORMAL HIGH (ref 70–99)
Glucose-Capillary: 100 mg/dL — ABNORMAL HIGH (ref 70–99)

## 2022-05-20 LAB — CBC
HCT: 27.7 % — ABNORMAL LOW (ref 39.0–52.0)
Hemoglobin: 9.2 g/dL — ABNORMAL LOW (ref 13.0–17.0)
MCH: 28.1 pg (ref 26.0–34.0)
MCHC: 33.2 g/dL (ref 30.0–36.0)
MCV: 84.7 fL (ref 80.0–100.0)
Platelets: 234 10*3/uL (ref 150–400)
RBC: 3.27 MIL/uL — ABNORMAL LOW (ref 4.22–5.81)
RDW: 16.1 % — ABNORMAL HIGH (ref 11.5–15.5)
WBC: 7.4 10*3/uL (ref 4.0–10.5)
nRBC: 0 % (ref 0.0–0.2)

## 2022-05-20 MED ORDER — HYDROMORPHONE HCL 1 MG/ML IJ SOLN
1.0000 mg | INTRAMUSCULAR | Status: DC | PRN
  Administered 2022-05-20 – 2022-05-22 (×6): 1 mg via INTRAVENOUS
  Filled 2022-05-20 (×6): qty 1

## 2022-05-20 MED ORDER — POLYETHYLENE GLYCOL 3350 17 G PO PACK
17.0000 g | PACK | Freq: Two times a day (BID) | ORAL | Status: DC
  Administered 2022-05-20 (×2): 17 g via NASOGASTRIC
  Filled 2022-05-20 (×3): qty 1

## 2022-05-20 NOTE — Progress Notes (Signed)
PROGRESS NOTE  Antonio Guerra  RWE:[id number] DOB: 1950/11/27 DOA: 05/19/2022 PCP: [name], MD   Brief Narrative: Patient is a 71 year old male with history of Merkel Cell carcinoma, erosive esophagitis, CKD stage IIIb, diabetes type 2, chronic systolic heart failure, coronary artery disease who presented from home with complaint of diarrhea with nausea, vomiting.  He was admitted for the same a week ago.  Work-up was done with EGD at the time which showed severe esophagitis.  Patient was discharged on Carafate and Protonix.  His nausea and vomiting worsened after he was given chemotherapy infusion as an outpatient.  Patient was admitted for further management.  Assessment & Plan:  Principal Problem:   Intractable nausea and vomiting Active Problems:   Esophagitis, [place] grade D   Hyperlipidemia   Chronic systolic congestive heart failure (HCC)   Type 2 diabetes mellitus (HCC)   Stage 3a chronic kidney disease (HCC)   Merkel cell carcinoma (HCC)   Acute kidney injury superimposed on chronic kidney disease (HCC)   Ileus (HCC)   Normocytic anemia  Intractable nausea/vomiting: Was admitted for the same a week ago and was discharged on Carafate and Protonix after EGD showed severe esophagitis. Abdominal x-ray showed dilated small bowel, colon, stool burden.  GI consulted.  Currently n.p.o.NG tube inserted Continue antiemetics, IV fluids  Esophagitis: Recently diagnosed with EGD.  Continue PPI twice daily.On carafate at home  AKI on CKD stage IIIa: Baseline creatinine ranges from 1.3-1.8 .currently kidney function close to baseline   Merkel cell carcinoma: Follows with oncology, currently on chemotherapy.  Oncology aware.has a firm mass on the right groin  Diabetes type 2: Currently NPO.  Currently on sliding's insulin.  Monitor blood sugars  Hyperlipidemia: Home medication on hold  Normocytic anemia: Currently hemoglobin stable  Chronic systolic congestive heart  failure: Currently on gentle IV fluids.  Last echo on 9/23 showed EF of 30 to 35%      Pressure Injury 05/12/22 Buttocks Left (Active)  05/12/22 2000  Location: Buttocks  Location Orientation: Left  Staging:   Wound Description (Comments):   Present on Admission: Yes  Dressing Type Foam - Lift dressing to assess site every shift 05/19/22 2300    DVT prophylaxis:SCDs Start: 05/19/22 1730     Code Status: Full Code  Family Communication: daughter at bedside on 11/1  Patient status:Inpatient  Patient is from :Home  Anticipated discharge YP:PJKD  Estimated DC date:Not sure   Consultants: GI  Procedures:None  Antimicrobials:  Anti-infectives (From admission, onward)    None       Subjective: Patient seen and examined at the bedside today.  Hemodynamically stable.  Daughter at bedside.  Complains of pain on the right groin and also abdominal pain.  He pulled his NG tube out and was reinserted.  Daughter concerned about the pain he is having.  No report of nausea or vomiting.  No bowel movement after admission.  Objective: Vitals:   05/19/22 1652 05/19/22 1743 05/19/22 2219 05/20/22 0542  BP: 132/70 124/77 130/68 136/80  Pulse: (!) 116 (!) 121 (!) 120 (!) 110  Resp: 19 (!) '22 18 18  '$ Temp: 97.6 F (36.4 C) 99 F (37.2 C) 98.2 F (36.8 C) 98.4 F (36.9 C)  TempSrc: Oral Oral Oral Oral  SpO2: 97% 96% 95% 96%  Weight:  74.7 kg    Height:  '6\' 1"'$  (1.854 m)      Intake/Output Summary (Last 24 hours) at 05/20/2022 0946 Last data filed at 05/20/2022 [phone]  Gross per 24 hour  Intake 1950.79 ml  Output 220 ml  Net 1730.79 ml   Filed Weights   05/19/22 1743  Weight: 74.7 kg    Examination:  General exam: Very deconditioned, weak HEENT: PERRL, NG tube Respiratory system:  no wheezes or crackles  Cardiovascular system: S1 & S2 heard, RRR.  Gastrointestinal system: Abdomen is nondistended, soft , has mild generalized tenderness.  No bowel sounds heard Central  nervous system: Alert and oriented Extremities: No edema, no clubbing ,no cyanosis, firm mass on the right groin Skin: No rashes, no ulcers,no icterus     Data Reviewed: I have personally reviewed following labs and imaging studies  CBC: Recent Labs  Lab 05/14/22 0346 05/15/22 0351 05/19/22 0915 05/19/22 1319 05/20/22 0617  WBC 4.1 3.7* 5.5 5.0 7.4  NEUTROABS 2.4 2.0 3.4 3.4  --   HGB 9.3* 9.4* 11.2* 11.5* 9.2*  HCT 27.5* 28.6* 33.3* 36.1* 27.7*  MCV 83.6 84.4 82.8 87.0 84.7  PLT 232 233 354 320 160   Basic Metabolic Panel: Recent Labs  Lab 05/14/22 0346 05/15/22 0351 05/19/22 0915 05/19/22 1319 05/20/22 0617  NA 136 136 139 139 139  K 3.7 3.9 3.8 4.2 3.8  CL 105 106 105 105 110  CO2 '25 25 26 24 23  '$ GLUCOSE 109* 106* 220* 203* 153*  BUN 17 11 25* 27* 27*  CREATININE 1.12 0.99 1.75* 2.03* 1.61*  CALCIUM 8.6* 8.8* 9.3 9.1 8.1*  MG 1.7 2.0  --  1.9  --      No results found for this or any previous visit (from the past 240 hour(s)).   Radiology Studies: DG Abd 1 View  Result Date: 05/20/2022 CLINICAL DATA:  [id number].  Check NGT placement. EXAM: ABDOMEN - 1 VIEW COMPARISON:  Flat plate abdomen yesterday at 9:26 p.m. FINDINGS: 4:08 a.m. The tip of the patient's NGT is well inside the stomach either in the distal body or antrum of the stomach. Large volume fecal stasis again noted without visible dilated small bowel. There are no supine findings of free air. IMPRESSION: 1. The tip of the patient's NGT is well inside the stomach. 2. Large volume fecal stasis. Electronically Signed   By: Telford Nab M.D.   On: 05/20/2022 05:12   DG Abd 1 View  Result Date: 05/19/2022 CLINICAL DATA:  Nasogastric tube present. EXAM: ABDOMEN - 1 VIEW COMPARISON:  Radiograph earlier today. FINDINGS: Tip and side port of the enteric tube below the diaphragm in the stomach. Large volume of stool in the right colon. Gaseous distension of transverse and left colon. IMPRESSION: Tip and side port  of the enteric tube below the diaphragm in the stomach. Electronically Signed   By: [place] Rake M.D.   On: 05/19/2022 22:05   DG Abd 1 View  Result Date: 05/19/2022 CLINICAL DATA:  NG tube placement. EXAM: ABDOMEN - 1 VIEW COMPARISON:  Earlier film, same date. FINDINGS: The NG tube tip is just below the GE junction and the proximal port is in the distal esophagus. This should be advanced several cm. Stable dilated colon. IMPRESSION: NG tube tip is just below the GE junction and the proximal port is in the distal esophagus. This should be advanced several cm. Electronically Signed   By: Marijo Sanes M.D.   On: 05/19/2022 21:02   DG Abd 1 View  Result Date: 05/19/2022 CLINICAL DATA:  Vomiting. EXAM: ABDOMEN - 1 VIEW COMPARISON:  None Available. FINDINGS: Dilated colon is noted with moderate amount  of stool present. No small bowel dilatation is noted. Vascular calcifications are noted. IMPRESSION: Dilated colon most consistent with ileus. Small bowel dilatation is noted. Moderate stool burden. Electronically Signed   By: Marijo Conception M.D.   On: 05/19/2022 15:36   DG Chest 1 View  Result Date: 05/19/2022 CLINICAL DATA:  Shortness of breath.  Vomiting. EXAM: CHEST  1 VIEW COMPARISON:  04/26/2022 FINDINGS: The cardiomediastinal contours are normal. Minor atelectasis in the lung bases. Pulmonary vasculature is normal. No consolidation, pleural effusion, or pneumothorax. No acute osseous abnormalities are seen. IMPRESSION: Minor bibasilar atelectasis. Electronically Signed   By: [place] Rake M.D.   On: 05/19/2022 15:05    Scheduled Meds:  insulin aspart  0-5 Units Subcutaneous QHS   insulin aspart  0-6 Units Subcutaneous TID WC   pantoprazole (PROTONIX) IV  40 mg Intravenous Q12H   Continuous Infusions:  sodium chloride 75 mL/hr at 05/20/22 0619     LOS: 1 day   Shelly Coss, MD [facility] P11/07/2021, 9:46 AM

## 2022-05-20 NOTE — Telephone Encounter (Signed)
Contacted patient to scheduled appointments. Left message with appointment details and a call back number if patient had any questions or could not accommodate the time we provided.   

## 2022-05-20 NOTE — Consult Note (Addendum)
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 Progressive disease on imaging. Oncology was trying to add another agent but couldn't get chemo yesterday due to N/V/ abdominal pain.   # Readmission for N/V  and abdominal pain.  Review of weights in Epic show significant weight loss of ~ 20 pounds. Symptoms possibly related to chemotherapy as well as severe gastritis).Takes Trulicity which can cause these symptoms but seems unlikely since  he has been on it for over two years. Diabetic gastroparesis also seems unlikely. Hgb A1c was 5.2 last week. Currently, constipation could be contributing to symptoms. Large volume fecal stasis seen on today's KUB Will first treat for constipation. No evidence for small bowel dilation on KUB. Little NGT output. Will given MIralax through NGT to purge bowels. Then if  no N/V can probably remove NGT.  Further recommendations after we see response to bowel purge.  Continue anti-emetics as needed.  Continue BID IV PPI  Limit narcotics Palliative Medicine has evaluated. Daughter not interested in Hospice.   # See PMH for additional medical problems   HPI   Antonio Guerra is a 71 y.o. male with a past medical history significant for Merkel cell carcinoma, CVA, CAD, chronic systolic CHF, DM, CKD 3.  See PMH for any additional medical problems.  Antonio Guerra was just released from hospital a few days ago after being admitted for N/V , abdominal pain, AKI on CKD. GI symptoms possibly secondary to [facility].  CT scan showed right lateral rectal wall thickening. We followed him that admission. He underwent EGD and flexible sigmoidoscopy. EGD findings included severe erosive esophagitis,  severe gastritis.   Treated with BID PPI and Carafate. Flexible sigmoidoscopy >> difficult visualization with stool in rectum but examined portion of rectum was normal. No proctitis or mass seen. Symptoms improved, discharged home on 10/27 on prn reglan, prn zofran, prn compazine, BID PPI, QID carafate.   Saw Oncology yesterday. Imaging after 3 cycles of Keytruda with progression . Discussed about doing carbo etoposide at 50% dose reduction given his poor performance status.  He adamantly wanted to try some more treatment. Last dose of chemo was last week. He was unable to get chemo yesterday. [facility] sent him to ED for N/V, abdominal pain and dehydration. He is weak, doesn't offer much history but tells me that within a couple of days of hospital discharge he was having recurrent mid abdominal pain , N/V. He doesn't know for sure if the pain is worse with eating. He says he has had a few small BMs yesterday and only small BMs in the previous several days.   Significant studies this admission WBC normal. Hgb stable at 9.2 ( after IVF)  Glu 153, Cr 2.03 ( 1.61 today) Normal K+ KUB >. The tip of the patient's NGT is well inside the stomach. Large volume fecal stasis.   Previous GI Evaluation    05/13/22 EGD -LA Grade D with no bleeding was found 30 to 38 cm from the incisors. Biopsies were taken with a  cold forceps for histology to exclude viral involvement given active chemotherapy use.. A 3 cm hiatal hernia was present. Severe, localized, mucosal changes characterized by edema, erythema, granularity, hemorrhagic appearance and nodularity were found in the gastric fundus. Biopsies were taken with a cold forceps for histology. Initially this was concerning for a mass lesion but on further inspection this may be severe retch gastritis.  A. STOMACH, FUNDUS MASS, BIOPSY:  - Vascular lesion consistent with lymphangioma.  - Negative for malignancy.  - See comment.   B. ESOPHAGUS, SEVERE ULCERATIVE ESOPHAGITIS,  BIOPSY:  - Squamous mucosa with inflamed granulation tissue consistent with  ulcer.  - Differential includes severe reflux.  - Negative for intestinal metaplasia, dysplasia or malignancy.    Recent Labs and Imaging DG Abd 1 View  Result Date: 05/20/2022 CLINICAL DATA:  [id number].  Check NGT placement. EXAM: ABDOMEN - 1 VIEW COMPARISON:  Flat plate abdomen yesterday at 9:26 p.m. FINDINGS: 4:08 a.m. The tip of the patient's NGT is well inside the stomach either in the distal body or antrum of the stomach. Large volume fecal stasis again noted without visible dilated small bowel. There are no supine findings of free air. IMPRESSION: 1. The tip of the patient's NGT is well inside the stomach. 2. Large volume fecal stasis. Electronically Signed   By: Telford Nab M.D.   On: 05/20/2022 05:12   DG Abd 1 View  Result Date: 05/19/2022 CLINICAL DATA:  Nasogastric tube present. EXAM: ABDOMEN - 1 VIEW COMPARISON:  Radiograph earlier today. FINDINGS: Tip and side port of the enteric tube below the diaphragm in the stomach. Large volume of stool in the right colon. Gaseous distension of transverse and left colon. IMPRESSION: Tip and side port of the enteric tube below the diaphragm in the stomach. Electronically Signed   By: Keith Rake M.D.   On: 05/19/2022 22:05   DG Abd 1 View  Result Date: 05/19/2022 CLINICAL DATA:  NG tube placement. EXAM: ABDOMEN - 1 VIEW COMPARISON:  Earlier film, same date. FINDINGS: The NG tube tip is just below the GE junction and the proximal port is in the distal esophagus. This should be advanced several cm. Stable dilated colon. IMPRESSION: NG tube tip is just below the GE junction and the proximal port is in the distal esophagus. This should be advanced several cm. Electronically Signed   By: Marijo Sanes M.D.   On: 05/19/2022 21:02   DG Abd 1 View  Result Date: 05/19/2022 CLINICAL DATA:  Vomiting. EXAM: ABDOMEN - 1 VIEW COMPARISON:  None Available. FINDINGS: Dilated  colon is noted with moderate amount of stool present. No small bowel dilatation is noted. Vascular calcifications are noted. IMPRESSION: Dilated colon most consistent with ileus. Small bowel dilatation is noted. Moderate stool burden. Electronically Signed   By: Marijo Conception M.D.   On: 05/19/2022 15:36   DG Chest 1 View  Result Date: 05/19/2022 CLINICAL DATA:  Shortness of breath.  Vomiting. EXAM: CHEST  1 VIEW COMPARISON:  04/26/2022 FINDINGS: The cardiomediastinal contours are normal. Minor atelectasis in the lung bases. Pulmonary vasculature is normal. No consolidation, pleural effusion, or pneumothorax. No acute osseous abnormalities are seen. IMPRESSION: Minor bibasilar atelectasis. Electronically Signed   By: Keith Rake M.D.   On: 05/19/2022 15:05   CT ABDOMEN PELVIS WO CONTRAST  Result Date: 05/11/2022 CLINICAL DATA:  Abdominal pain with nausea and vomiting. EXAM: CT ABDOMEN AND PELVIS WITHOUT CONTRAST TECHNIQUE: Multidetector CT imaging of the abdomen and pelvis was performed  following the standard protocol without IV contrast. RADIATION DOSE REDUCTION: This exam was performed according to the departmental dose-optimization program which includes automated exposure control, adjustment of the mA and/or kV according to patient size and/or use of iterative reconstruction technique. COMPARISON:  May 04, 2022 FINDINGS: Lower chest: Mild atelectatic changes are seen within the right middle lobe and bilateral lung bases. Hepatobiliary: No focal liver abnormality is seen. No gallstones, gallbladder wall thickening, or biliary dilatation. Pancreas: Numerous tiny parenchymal calcifications are seen scattered throughout the pancreas. There is no evidence of pancreatic ductal dilatation or surrounding inflammatory fat stranding. Spleen: Normal in size without focal abnormality. Adrenals/Urinary Tract: Adrenal glands are unremarkable. Kidneys are normal in size, without obstructing renal calculi or  hydronephrosis. 2 mm nonobstructing renal calculi are seen within both kidneys. Mild focal cortical scarring is seen along the posterolateral aspect of the mid to lower left kidney. Bladder is unremarkable. Stomach/Bowel: There is a small, stable hiatal hernia. Appendix appears normal. Stool is seen throughout the large bowel. No evidence of bowel dilatation. Mild, asymmetric anterior and right lateral rectal wall thickening is seen (axial CT images 86 through 88, CT series 2). Vascular/Lymphatic: Aortic atherosclerosis. Multiple stable enlarged right pelvic and right inguinal lymph nodes are seen (the largest measuring approximately 3.3 cm x 2.8 cm), with a stable 7.3 cm x 7.5 cm necrotic right inguinal mass. Reproductive: The prostate gland is mildly enlarged and unchanged in size. Other: A stable 16 mm x 11 mm fat containing umbilical hernia is seen. No abdominopelvic ascites. Musculoskeletal: No acute or significant osseous findings. IMPRESSION: 1. Mild, asymmetric anterior and right lateral rectal wall thickening, as described above. While this may be, in part, secondary to underdistention, sequelae associated with an underlying neoplastic process cannot be excluded. Correlation with colonoscopy is recommended. 2. Stable right pelvic and right inguinal lymphadenopathy with a stable 7.3 cm x 7.5 cm necrotic right inguinal mass. 3. Small, stable hiatal hernia. 4. Bilateral 2 mm nonobstructing renal calculi. 5. Aortic atherosclerosis. Aortic Atherosclerosis (ICD10-I70.0). Electronically Signed   By: Virgina Norfolk M.D.   On: 05/11/2022 21:16   NM PET Image Restag (PS) Skull Base To Thigh  Result Date: 05/10/2022 CLINICAL DATA:  Subsequent treatment strategy for history of Merkel cell, assess response to therapy. EXAM: NUCLEAR MEDICINE PET SKULL BASE TO THIGH TECHNIQUE: 9.2 mCi F-18 FDG was injected intravenously. Full-ring PET imaging was performed from the skull base to thigh after the radiotracer. CT  data was obtained and used for attenuation correction and anatomic localization. Fasting blood glucose: 123 mg/dl COMPARISON:  Previous PET imaging from February 20, 2022. FINDINGS: Mediastinal blood pool activity: SUV max 2.86 Liver activity: SUV max NA NECK: No hypermetabolic lymph nodes in the neck. Incidental CT findings: None. CHEST: No hypermetabolic mediastinal or hilar nodes. No suspicious pulmonary nodules on the CT scan. Incidental CT findings: Extensive coronary artery disease most notably on the LEFT. No pericardial effusion. Aortic atherosclerosis. No adenopathy by size criteria in the chest. Basilar atelectasis. Airways are patent. ABDOMEN/PELVIS: Increasing size of RIGHT pelvic adenopathy and RIGHT groin mass. New RIGHT pelvic sidewall adenopathy. Mass in the RIGHT groin measuring 7.6 x 7.7 cm previously 7.6 x 6.8 cm now within maximum SUV of 14.65 previously 22.08. Some signs of central necrosis. Adjacent lymph nodes also with increase in size and comparison references are made to the August 4 PET examination. Nodes are stable compared to recent imaging from October of 2023. Similarly hypermetabolic lymph nodes which are newly enlarged (  image 184/4) 14 mm as compared to 10 mm size. New RIGHT pelvic sidewall adenopathy (image 180/4) 22 mm short axis showing a maximum SUV of 14.50. External iliac adenopathy enlarged since previous imaging 23 mm (image 183/4) previously 12 mm maximum SUV on today's study of 7.45 as compared to 6.8. Large RIGHT external iliac lymph node (image 177/4) 2.7 as compared to 2.2 cm greatest axial dimension similar FDG uptake as seen similarly FDG avid to the adjacent RIGHT external iliac lymph node. Previously at 10.09. Enlarging RIGHT common iliac lymph node (image 155/4) 16 mm short axis previously 5 mm showing a maximum SUV of 10.2 as compared to 4.96. Similar enlargement and increase in metabolic activity associated with an adjacent RIGHT internal-external iliac bifurcation  node 163/4. No lower abdominal retroperitoneal adenopathy or solid organ disease. Incidental CT findings: Aortic atherosclerosis. No acute findings in the abdomen. Decreased distension of small bowel since previous imaging. SKELETON: No focal hypermetabolic activity to suggest skeletal metastasis. Small focus of soft tissue density in the LEFT lower quadrant in the inguinal region associated with the dermis measures 14 mm previously approximately 14 mm with minimal FDG uptake which is not substantially changed. Incidental CT findings: None. IMPRESSION: 1. Increasing size and increase in size of RIGHT pelvic adenopathy and RIGHT groin mass. Dominant mass does show measurable interval decrease in FDG uptake and some central necrosis. 2. New RIGHT pelvic sidewall adenopathy with increasing size of and increasing metabolic activity associated with RIGHT common and upper external iliac adenopathy at the internal-external bifurcation. 3. Increasing size of an hypermetabolic changes related to lymph nodes adjacent to the dominant RIGHT groin mass. 4. No hypermetabolic abdominal retroperitoneal adenopathy or other evidence of distant metastatic disease. 5. Skin lesion in the LEFT lower quadrant of uncertain significance is unchanged. 6. Decreased distension of small bowel since previous imaging. 7. Extensive coronary artery disease most notably on the LEFT. 8. Aortic atherosclerosis. Aortic Atherosclerosis (ICD10-I70.0). Electronically Signed   By: Zetta Bills M.D.   On: 05/10/2022 19:50   CT ABDOMEN PELVIS W CONTRAST  Result Date: 05/04/2022 CLINICAL DATA:  NAUSEA, VOMITING. ABDOMINAL PAIN SINCE LAST CHEMOTHERAPY 2 WEEKS AGO. EXAM: CT ABDOMEN AND PELVIS WITH CONTRAST TECHNIQUE: Multidetector CT imaging of the abdomen and pelvis was performed using the standard protocol following bolus administration of intravenous contrast. RADIATION DOSE REDUCTION: This exam was performed according to the departmental  dose-optimization program which includes automated exposure control, adjustment of the mA and/or kV according to patient size and/or use of iterative reconstruction technique. CONTRAST:  27m OMNIPAQUE IOHEXOL 300 MG/ML  SOLN COMPARISON:  None Available. FINDINGS: Lower chest: No acute abnormality.  Bibasilar dependent atelectasis. Hepatobiliary: No focal liver abnormality is seen. No gallstones, gallbladder wall thickening, or biliary dilatation. Pancreas: Mild pancreatic atrophy. No pancreatic ductal dilatation or surrounding inflammatory changes. Spleen: Normal in size without focal abnormality. Adrenals/Urinary Tract: Adrenal glands are unremarkable. Kidneys are normal, without renal calculi, focal lesion, or hydronephrosis. Small hypodense cysts in the lower pole of the left kidney. Bladder is unremarkable. Stomach/Bowel: Small hiatal hernia. Stomach is within normal limits. Appendix appears normal. There are multiple fluid-filled distended small bowel loops without evidence of obstruction, similar to prior examination. Vascular/Lymphatic: Aortic atherosclerosis. Right inguinal region necrotic mass measuring approximately 7.5 x 7.0 cm, not significantly changed. Another right inguinal lymph node along the right external iliac vessels measuring approximately 3.0 x 2.5 cm, also not significantly changed. Reproductive: Mildly enlarged prostate. Other: No abdominal wall hernia or abnormality. No abdominopelvic  ascites. Musculoskeletal: No acute or significant osseous findings. IMPRESSION: 1. Multiple fluid-filled distended small bowel loops without evidence of obstruction, similar to prior examination, likely suggesting ileus. 2. Right inguinal region necrotic mass measuring approximately 7.5 x 7.0 cm, not significantly changed. Another right inguinal lymph node along the right external iliac vessels measuring approximately 3.0 x 2.5 cm, also not significantly changed. 3. Small hiatal hernia. 4. Aortic  atherosclerosis. 5. Mildly enlarged prostate. Electronically Signed   By: Keane Police D.O.   On: 05/04/2022 12:37   CT ABDOMEN PELVIS W CONTRAST  Result Date: 04/26/2022 CLINICAL DATA:  Epigastric pain. Pt is being treated for cancer, abd pain .vomiting 3 days. Merkel cell carcinoma. EXAM: CT ABDOMEN AND PELVIS WITH CONTRAST TECHNIQUE: Multidetector CT imaging of the abdomen and pelvis was performed using the standard protocol following bolus administration of intravenous contrast. RADIATION DOSE REDUCTION: This exam was performed according to the departmental dose-optimization program which includes automated exposure control, adjustment of the mA and/or kV according to patient size and/or use of iterative reconstruction technique. CONTRAST:  <See Chart> OMNIPAQUE IOHEXOL 300 MG/ML SOLN, 42m OMNIPAQUE IOHEXOL 300 MG/ML SOLN COMPARISON:  Head CT 02/20/2022 FINDINGS: Lower chest: Limited evaluation due to respiratory motion artifact. Question peribronchovascular ground-glass airspace opacities that are poorly visualized. Patulous distal esophagus with distal circumferential esophageal wall thickening. Hepatobiliary: No focal liver abnormality. No gallstones, gallbladder wall thickening, or pericholecystic fluid. No biliary dilatation. Pancreas: No focal lesion. Normal pancreatic contour. No surrounding inflammatory changes. No main pancreatic ductal dilatation. Spleen: Normal in size without focal abnormality. Adrenals/Urinary Tract: No adrenal nodule bilaterally. Bilateral kidneys enhance symmetrically. There is a 1.2 cm left inferior renal pole complex cystic lesion. No hydronephrosis. No hydroureter. The urinary bladder is unremarkable. On delayed imaging, there is no urothelial wall thickening and there are no filling defects in the opacified portions of the bilateral collecting systems or ureters. Stomach/Bowel: There is gastric wall thickening along the gastroesophageal junction. Several loops of small  bowel are distended with fluid. No pneumatosis. No evidence of bowel wall thickening or dilatation. Appendix appears normal. Vascular/Lymphatic: No abdominal aorta or iliac aneurysm. Severe atherosclerotic plaque of the aorta and its branches. Interval increase in size of a 7.6 x 7.7 cm centrally necrotic right inguinal mass (2:93). Interval increase in size of a right [place] lymph node measuring 3.7 x 3.1 cm (from 2.2 x 3.1 cm). Interval increase in size of right iliac lymph nodes with as an example a 1.8 cm (from 0.7 cm) lymph node. Reproductive: The prostate is enlarged measuring up to 5 cm. Other: No intraperitoneal free fluid. No intraperitoneal free gas. No organized fluid collection. Musculoskeletal: No abdominal wall hernia or abnormality. No suspicious lytic or blastic osseous lesions. No acute displaced fracture. Multilevel degenerative changes of the spine. IMPRESSION: 1. Question peribronchovascular ground-glass airspace opacities that are poorly visualized due to respiratory motion artifact. 2. Distal circumferential esophageal wall thickening as well as gastroesophageal junction wall thickening. Recommend direct visualization/biopsy for further evaluation. 3. Interval increase in size of right pelvic and inguinal lymphadenopathy in a patient with known malignancy. 4. A complex 1.2 cm left inferior renal pole lesion is incompletely evaluated on this study. When the patient is clinically stable and able to follow directions and hold their breath (preferably as an outpatient) further evaluation with dedicated nonemergent abdominal MRI renal protocol should be considered. 5.  Aortic Atherosclerosis (ICD10-I70.0). Electronically Signed   By: MIven FinnM.D.   On: 04/26/2022 21:17   DG Chest  2 View  Result Date: 04/26/2022 CLINICAL DATA:  Vomiting. Chemotherapy reaction with vomiting for 3 days. History of skin cancer. EXAM: CHEST - 2 VIEW COMPARISON:  03/05/2020 FINDINGS: The heart size and  mediastinal contours are within normal limits. Both lungs are clear. The visualized skeletal structures are unremarkable. IMPRESSION: No active cardiopulmonary disease. Electronically Signed   By: Lucienne Capers M.D.   On: 04/26/2022 19:09   MYOCARDIAL PERFUSION IMAGING  Result Date: 04/21/2022   Findings are consistent with no prior ischemia and no prior myocardial infarction. The study is high risk. Based on estimated EF   No ST deviation was noted.   There is no evidence of ischemia. There is no evidence of infarction.   Left ventricular function is abnormal. Global function is severely reduced. Nuclear stress EF: 34 %. The left ventricular ejection fraction is moderately decreased (30-44%). End diastolic cavity size is normal. End systolic cavity size is mildly enlarged.   Prior study not available for comparison. Normal perfusion Global hypokinesis abnormal septal motion estimated EF 34% Suggest echo/MRI correlation    Labs:  Recent Labs    05/19/22 0915 05/19/22 1319 05/20/22 0617  WBC 5.5 5.0 7.4  HGB 11.2* 11.5* 9.2*  HCT 33.3* 36.1* 27.7*  PLT 354 320 234   Recent Labs    05/19/22 0915 05/19/22 1319 05/20/22 0617  NA 139 139 139  K 3.8 4.2 3.8  CL 105 105 110  CO2 _0 GLUCOSE 220* 203* 153*  BUN 25* 27* 27*  CREATININE 1.75* 2.03* 1.61*  CALCIUM 9.3 9.1 8.1*   Recent Labs    05/20/22 0617  PROT 6.4*  ALBUMIN 3.0*  AST 22  ALT 34  ALKPHOS 72  BILITOT 1.1   No results for input(s): "HEPBSAG", "HCVAB", "HEPAIGM", "HEPBIGM" in the last 72 hours. No results for input(s): "LABPROT", "INR" in the last 72 hours.  Past Medical History:  Diagnosis Date   Altered mental status    Arthritis    "all over" (12/20/2015)   Chronic lower back pain    Chronic systolic congestive heart failure ([place])    a. 04/2014 Echo: EF 20-25%.   Coronary artery disease    a. 2011 MI x 2 with PCI: stent x 2 (LAD and RI) @ [facility] in [place], [place];  b. 04/2014 Cath/PCI: LM  10-20, LAD 40p, 3m 853mSR(3.5x38 Xience DES), 50apical, LCX 20 diffuse, RI 50p, 2037mR, RCA 40-43m31md.   Fibromyalgia    Hyperlipidemia    Hypertension    Hypertensive urgency 09/27/2015   [facility]   [facility]   "right hand weaker since" (12/20/2015)   [facility]  Past Surgical History:  Procedure Laterality Date   ANKLE FRACTURE SURGERY Right    BIOPSY  05/13/2022   Procedure: BIOPSY;  Surgeon: PyrtJerene Bears;  Location: [facility] ENDOSCOPY;  Service: Gastroenterology;;  egd with biopsies   CARDIAC CATHETERIZATION N/A 09/30/2015   Procedure: Left Heart Cath and Coronary Angiography;  Surgeon: ChriBurnell Blanks;  [facility];  Service: Cardiovascular;  Laterality: N/A;   CORONARY ANGIOPLASTY WITH STENT PLACEMENT     "total of 3 stents" (12/20/2015)   ESOPHAGOGASTRODUODENOSCOPY (EGD) WITH PROPOFOL N/A 05/13/2022   Procedure: ESOPHAGOGASTRODUODENOSCOPY (EGD) WITH PROPOFOL;  Surgeon: PyrtJerene Bears;  Location: [facility] ENDOSCOPY;  Service: Gastroenterology;  Laterality: N/A;   FLEXIBLE SIGMOIDOSCOPY N/A 05/13/2022   Procedure: FLEXIBLE SIGMOIDOSCOPY;  Surgeon: Jerene Bears, MD;  Location: [facility];  Service: Gastroenterology;  Laterality: N/A;   FRACTIONAL FLOW RESERVE WIRE  05/11/2014   Procedure: FRACTIONAL FLOW RESERVE WIRE;  Surgeon: Burnell Blanks, MD;  Location: [facility] CATH LAB;  Service: Cardiovascular;;   FRACTURE SURGERY     LEFT HEART CATHETERIZATION WITH CORONARY/GRAFT ANGIOGRAM N/A 05/11/2014   Procedure: LEFT HEART CATHETERIZATION WITH Beatrix Fetters;  Surgeon: Burnell Blanks, MD;  Location[facility] CATH LAB;  Service: Cardiovascular;  Laterality: N/A;   PERCUTANEOUS CORONARY STENT INTERVENTION (PCI-S)  05/11/2014   Procedure: PERCUTANEOUS CORONARY STENT INTERVENTION (PCI-S);  Surgeon: Burnell Blanks, MD;  Location: [facility] CATH LAB;  Service: Cardiovascular;;    Family  History  Problem Relation Age of Onset   Diabetes Mother    CAD Neg Hx    Stomach cancer Neg Hx    Colon cancer Neg Hx    Esophageal cancer Neg Hx    Pancreatic cancer Neg Hx     Prior to Admission medications   Medication Sig Start Date End Date Taking? Authorizing Provider  acetaminophen (TYLENOL) 650 MG CR tablet Take 650 mg by mouth every 8 (eight) hours as needed for pain.   Yes [provider]  alum & mag hydroxide-simeth (MAALOX/MYLANTA) 200-200-20 MG/5ML suspension Take 15 mLs by mouth every 4 (four) hours as needed for indigestion or heartburn.   Yes [provider]  atorvastatin (LIPITOR) 80 MG tablet TAKE 1 TABLET(80 MG) BY MOUTH AT BEDTIME Patient taking differently: Take 80 mg by mouth at bedtime. 05/19/21  Yes [name], MD  carvedilol (COREG) 12.5 MG tablet Take 12.5 mg by mouth 2 (two) times daily with a meal.   Yes [provider]  clopidogrel (PLAVIX) 75 MG tablet TAKE 1 TABLET(75 MG) BY MOUTH DAILY Patient taking differently: Take 75 mg by mouth daily. 01/29/21  Yes [name], MD  cycloSPORINE (RESTASIS) 0.05 % ophthalmic emulsion Place 1 drop into both eyes 2 (two) times daily.   Yes [provider]  Dulaglutide (TRULICITY) 5.10 CH/8.5ID SOPN Inject 0.75 mg into the skin once a week. Friday or saturday   Yes [provider]  gabapentin (NEURONTIN) 400 MG capsule TAKE 1 CAPSULE BY MOUTH TWICE DAILY Patient taking differently: Take 400 mg by mouth 2 (two) times daily. 02/20/21  Yes [name], MD  isosorbide mononitrate (ISMO) 10 MG tablet Take 10 mg by mouth daily.   Yes [provider]  lidocaine (SALONPAS PAIN RELIEVING) 4 % Place 1 patch onto the skin daily as needed (pain).   Yes [provider]  lidocaine-prilocaine (EMLA) cream Apply to port site at least 1 hour prior to use 05/19/22  Yes Iruku, Arletha Pili, MD  metoCLOPramide (REGLAN) 10 MG tablet Take 1 tablet (10 mg total) by mouth every 6 (six)  hours as needed for up to 20 days for nausea. 05/04/22 05/24/22 Yes Fransico Meadow, MD  nitroGLYCERIN (NITROSTAT) 0.4 MG SL tablet PLACE 1 TABLET UNDER THE TONGUE EVERY 5 MINS AS NEEDED FOR CHEST PAIN Patient taking differently: Place 0.4 mg under the tongue every 5 (five) minutes as needed for chest pain. 04/08/21  Yes [name], MD  prochlorperazine (COMPAZINE) 10 MG tablet Take 1 tablet (10 mg total) by mouth every 6 (six) hours as needed for nausea or vomiting (Nausea or vomiting). 05/13/22  Yes Benay Pike, MD  sucralfate (CARAFATE) 1 g tablet Take 1 tablet (1 g total) by mouth 4 (four) times daily -  with meals  and at bedtime for 7 days. May use longer if needed. 05/15/22 05/22/22 Yes Eugenie Filler, MD  Blood Glucose Monitoring Suppl (ONETOUCH VERIO) w/Device KIT 30 Units by Does not apply route daily. 11/02/17   Diallo, Earna Coder, MD  Continuous Blood Gluc Receiver (FREESTYLE LIBRE 14 DAY READER) DEVI 1 Device by Does not apply route every morning. 11/08/19   Bonnita Hollow, MD  dexamethasone (DECADRON) 4 MG tablet Take 2 tablets daily for 2 days, on days 4 and 5.Take with food. Every 21 days. 05/13/22   Benay Pike, MD  Incontinence Supply Disposable (DEPEND ADJUSTABLE UNDERWEAR LG) MISC 1 application by Does not apply route as needed. 08/09/20   [name], MD  Insulin Pen Needle (BD PEN NEEDLE NANO U/F) 32G X 4 MM MISC AS DIRECTED TWICE DAILY 02/24/21   [name], MD  Lancets [facility] ULTRASOFT) lancets USE AS DIRECTED 08/09/20   [name], MD  ondansetron (ZOFRAN) 8 MG tablet Take 1 tablet (8 mg total) by mouth every 8 (eight) hours as needed for nausea or vomiting. Start on third day after chemotherapy. 05/13/22   Benay Pike, MD  ONETOUCH VERIO test strip USE TO TEST BLOOD SUGAR 2 TO 3 TIMES A DAY 04/09/21   [name], MD  pantoprazole (PROTONIX) 40 MG tablet Take 1 tablet (40 mg total) by mouth 2 (two) times daily for 56 days, THEN 1 tablet (40 mg total) daily.  05/15/22 05/15/22  Eugenie Filler, MD    Current Facility-Administered Medications  Medication Dose Route Frequency Provider Last Rate Last Admin   0.9 %  sodium chloride infusion   Intravenous Continuous Cherylann Ratel A, DO 75 mL/hr at 05/20/22 0619 New Bag at 05/20/22 4193   HYDROmorphone (DILAUDID) injection 1 mg  1 mg Intravenous Q4H PRN Shelly Coss, MD       insulin aspart (novoLOG) injection 0-5 Units  0-5 Units Subcutaneous QHS Marylyn Ishihara, Tyrone A, DO       insulin aspart (novoLOG) injection 0-6 Units  0-6 Units Subcutaneous TID WC Kyle, Tyrone A, DO       LORazepam (ATIVAN) injection 1 mg  1 mg Intravenous Q6H PRN Marylyn Ishihara, Tyrone A, DO       pantoprazole (PROTONIX) injection 40 mg  40 mg Intravenous Q12H Kyle, Tyrone A, DO   40 mg at 05/20/22 0930   prochlorperazine (COMPAZINE) injection 10 mg  10 mg Intravenous Q6H PRN Kyle, Tyrone A, DO        Allergies as of 05/19/2022 - Review Complete 05/19/2022  Allergen Reaction Noted   Lisinopril Other (See Comments) 03/11/2021   Tape Itching and Rash 12/18/2015    Social History   Socioeconomic History   Marital status: Married    Spouse name: Not on file   Number of children: Not on file   Years of education: Not on file   Highest education level: Not on file  Occupational History   Occupation: retired   Tobacco Use   Smoking status: Former   Smokeless tobacco: Never   Tobacco comments:    12/20/2015 "smoked cigarettes 30-75yrago"  Vaping Use   Vaping Use: Never used  Substance and Sexual Activity   Alcohol use: Not Currently    Alcohol/week: 5.0 standard drinks of alcohol    Types: 5 Cans of beer per week    Comment: stopped 4 yrs ago    Drug use: No   Sexual activity: Not Currently    Partners: Female  Other Topics Concern  Not on file  Social History Narrative   Current Social History        Who lives at home: lives with daughter [name] 24-Feb-2021    Who would speak for you about health care matters: [name]  2021/02/24    Transportation: Has [employer] and daughter takes to appointments 2021/02/24   Current Stressors: wife and daughter passed away in 09/07/20 2021-02-24   Work / Education:  worked in the [employer] and with [employer] February 24, 2021   Other: limited mobility uses cane and walker and memory decline ( per daughter)  February 24, 2021                                                                                                       Social Determinants of Health   Financial Resource Strain: Low Risk  (03/11/2021)   Overall Financial Resource Strain (CARDIA)    Difficulty of Paying Living Expenses: Not hard at all  Food Insecurity: No Food Insecurity (05/19/2022)   Hunger Vital Sign    Worried About Running Out of Food in the Last Year: Never true    [place] in the Last Year: Never true  Transportation Needs: No Transportation Needs (05/19/2022)   PRAPARE - Hydrologist (Medical): No    Lack of Transportation (Non-Medical): No  Physical Activity: Inactive (03/11/2021)   Exercise Vital Sign    Days of Exercise per Week: 0 days    Minutes of Exercise per Session: 0 min  Stress: Stress Concern Present (03/11/2021)   [place]    Feeling of Stress : Rather much  Social Connections: Socially Isolated (03/11/2021)   Social Connection and Isolation Panel [NHANES]    Frequency of Communication with Friends and Family: Once a week    Frequency of Social Gatherings with Friends and Family: Once a week    Attends Religious Services: Never    Marine scientist or Organizations: No    Attends Archivist Meetings: Never    Marital Status: Widowed  Intimate Partner Violence: Not At Risk (05/19/2022)   Humiliation, Afraid, Rape, and Kick questionnaire    Fear of Current or Ex-Partner: No    Emotionally Abused: No    Physically Abused: No    Sexually Abused: No    Review of  Systems: All systems reviewed and negative except where noted in HPI.  Physical Exam: Vital signs in last 24 hours: Temp:  [97.6 F (36.4 C)-99 F (37.2 C)] 98.4 F (36.9 C) (11/01 0542) Pulse Rate:  [91-121] 110 (11/01 0542) Resp:  [17-22] 18 (11/01 0542) BP: (104-144)/(68-99) 136/80 (11/01 0542) SpO2:  [94 %-98 %] 96 % (11/01 0542) Weight:  [74.7 kg] 74.7 kg (10/31 1743)    General:  Frail appearing,  thin male in NAD. NGT >> small amount light brown output Psych:  cooperative. Normal mood and affect Eyes: Pupils equal Ears:  Normal auditory acuity Nose: No deformity, discharge or lesions Neck:  Supple, no masses felt Lungs:  Clear to  auscultation.  Heart:  Regular rate, regular rhythm.  Abdomen:  Soft, nondistended, nontender, a few bowel sounds, no masses felt Rectal :  Deferred Msk: Symmetrical without gross deformities.  Neurologic:  Alert, oriented, grossly normal neurologically Skin:  Intact without significant lesions.    Intake/Output from previous day: 10/31 0701 - 11/01 0700 In: 1950.8 [I.V.:900.8; NG/GT:50; IV Piggyback:1000] Out: 220 [Urine:100; Emesis/NG output:120] Intake/Output this shift:  No intake/output data recorded.    Principal Problem:   Intractable nausea and vomiting Active Problems:   Hyperlipidemia   Chronic systolic congestive heart failure (HCC)   Type 2 diabetes mellitus (HCC)   Stage 3a chronic kidney disease (HCC)   Merkel cell carcinoma (HCC)   Acute kidney injury superimposed on chronic kidney disease (HCC)   Esophagitis, [place] grade D   Ileus ([place])   Normocytic anemia    Tye Savoy, NP-C @  05/20/2022, 11:11 AM  -------------------------------------------------------------------------------------  I have taken a history, reviewed the chart and examined the patient. I performed a substantive portion of this encounter, including complete performance of at least one of the key components, in conjunction with the  APP. 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  Multiple imaging modalities during this time showed intermittent dilation of the small large bowel consistent with ileus.  Flexible sigmoidoscopy last admission was limited by amount of solid stool present, but was negative for any evidence of inflammation of the rectum or rectosigmoid colon.  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 His symptoms and imaging are most consistent with ileus, however I am less clear on the exact cause.  Certainly he is at increased risk for this due to his malignancy and debilitated state, and this may be the only underlying reason.  He has also taken narcotics recently off and on, which would definitely contribute.  Given the large amount of stool burden seen on imaging and flex sig, I would like to rectify this first, and then see how his symptoms do. We will give tapwater enema today and try gentle laxatives with MiraLAX via NG tube twice daily.  If his nausea and retching improve, we can DC the NG tube, and consider more aggressive bowel regimen with more frequent MiraLAX and enemas.  If no improvement in symptoms or bowel dilatation, we can consider other medications such as neostigmine or methylnaltrexone. Resume Carafate once demonstrating p.o. tolerance  Mollee Neer E. Candis Schatz, MD [facility]

## 2022-05-20 NOTE — TOC Initial Note (Signed)
Transition of Care [facility]) - Initial/Assessment Note    Patient Details  Name: Antonio Guerra MRN: [id number] Date of Birth: June 25, 1951  Transition of Care [facility]) CM/SW Contact:    Leeroy Cha, RN Phone Number: 05/20/2022, 9:44 AM  Clinical Narrative:                  Transition of Care [facility]) Screening Note   Patient Details  Name: Antonio Guerra Date of Birth: 12/06/1950   Transition of Care [facility]) CM/SW Contact:    Leeroy Cha, RN Phone Number: 05/20/2022, 9:44 AM    Transition of Care Department [facility]) has reviewed patient and no TOC needs have been identified at this time. We will continue to monitor patient advancement through interdisciplinary progression rounds. If new patient transition needs arise, please place a TOC consult.    Expected Discharge Plan: Home/Self Care Barriers to Discharge: Continued Medical Work up   Patient Goals and CMS Choice Patient states their goals for this hospitalization and ongoing recovery are:: to go home      Expected Discharge Plan and Services Expected Discharge Plan: Home/Self Care   Discharge Planning Services: CM Consult   Living arrangements for the past 2 months: Apartment                                      Prior Living Arrangements/Services Living arrangements for the past 2 months: Apartment Lives with:: Spouse Patient language and need for interpreter reviewed:: Yes Do you feel safe going back to the place where you live?: Yes            Criminal Activity/Legal Involvement Pertinent to Current Situation/Hospitalization: No - Comment as needed  Activities of Daily Living Home Assistive Devices/Equipment: None ADL Screening (condition at time of admission) Patient's cognitive ability adequate to safely complete daily activities?: No Is the patient deaf or have difficulty hearing?: No Does the patient have difficulty seeing, even when wearing glasses/contacts?: No Does the patient have  difficulty concentrating, remembering, or making decisions?: No Patient able to express need for assistance with ADLs?: Yes Does the patient have difficulty dressing or bathing?: Yes Independently performs ADLs?: No Communication: Independent Dressing (OT): Needs assistance Is this a change from baseline?: Pre-admission baseline Grooming: Needs assistance Is this a change from baseline?: Pre-admission baseline Feeding: Needs assistance Is this a change from baseline?: Pre-admission baseline Bathing: Needs assistance Is this a change from baseline?: Pre-admission baseline Toileting: Needs assistance Is this a change from baseline?: Pre-admission baseline In/Out Bed: Needs assistance Is this a change from baseline?: Pre-admission baseline Walks in Home: Needs assistance Is this a change from baseline?: Pre-admission baseline Does the patient have difficulty walking or climbing stairs?: Yes Weakness of Legs: Both Weakness of Arms/Hands: None  Permission Sought/Granted                  Emotional Assessment Appearance:: Appears stated age Attitude/Demeanor/Rapport: Engaged Affect (typically observed): Calm Orientation: : Oriented to Self, Oriented to Place, Oriented to  Time, Oriented to Situation Alcohol / Substance Use: Tobacco Use (history of tobacco use none currently) Psych Involvement: No (comment)  Admission diagnosis:  Dehydration [E86.0] Ileus ([place]) [K56.7] AKI (acute kidney injury) ([place]) [N17.9] Intractable nausea and vomiting [R11.2] Patient Active Problem List   Diagnosis Date Noted   Intractable nausea and vomiting 05/19/2022   Ileus ([place]) 05/19/2022   Normocytic anemia 05/19/2022   Esophagitis, [place] grade D 05/14/2022   Acute kidney injury superimposed on chronic kidney disease (HCC)    Nausea and vomiting    Dehydration    Acute esophagitis    Gastritis without bleeding    Abnormal CT scan, colon    AKI (acute kidney injury) ([place]) 05/11/2022    Merkel cell carcinoma ([place]) 02/27/2022   Merkel cell carcinoma nodal presentation ([place]) 02/16/2022   Inguinal lymphadenopathy 01/26/2022   Stage 3a chronic kidney disease (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 [place] [address] Phone: [phone] Fax: [phone]     Social Determinants of Health (SDOH) Interventions    Readmission Risk Interventions   Row Labels 05/20/2022    9:42 AM  Readmission Risk Prevention Plan   Section Header. No data exists in this row.   Transportation Screening   Complete  Medication Review Press photographer)   Complete  PCP or Specialist appointment within 3-5 days of discharge   Complete  HRI or [place]   Complete  SW Recovery Care/Counseling Consult   Complete  [place]   Not Applicable

## 2022-05-20 NOTE — Progress Notes (Signed)
Tap water administered as ordered, patient had two large bowel movements (type6/7) after administration of this.

## 2022-05-21 ENCOUNTER — Ambulatory Visit: Payer: Medicare (Managed Care)

## 2022-05-21 DIAGNOSIS — K208 Other esophagitis without bleeding: Secondary | ICD-10-CM | POA: Diagnosis not present

## 2022-05-21 DIAGNOSIS — K567 Ileus, unspecified: Secondary | ICD-10-CM | POA: Diagnosis not present

## 2022-05-21 DIAGNOSIS — R112 Nausea with vomiting, unspecified: Secondary | ICD-10-CM | POA: Diagnosis not present

## 2022-05-21 LAB — BASIC METABOLIC PANEL
Anion gap: 9 (ref 5–15)
BUN: 19 mg/dL (ref 8–23)
CO2: 22 mmol/L (ref 22–32)
Calcium: 8.2 mg/dL — ABNORMAL LOW (ref 8.9–10.3)
Chloride: 110 mmol/L (ref 98–111)
Creatinine, Ser: 1.21 mg/dL (ref 0.61–1.24)
GFR, Estimated: >60 mL/min (ref >60)
Glucose, Bld: 93 mg/dL (ref 70–99)
Potassium: 3.7 mmol/L (ref 3.5–5.1)
Sodium: 141 mmol/L (ref 135–145)

## 2022-05-21 LAB — GLUCOSE, CAPILLARY
Glucose-Capillary: 89 mg/dL (ref 70–99)
Glucose-Capillary: 95 mg/dL (ref 70–99)
Glucose-Capillary: 126 mg/dL — ABNORMAL HIGH (ref 70–99)
Glucose-Capillary: 113 mg/dL — ABNORMAL HIGH (ref 70–99)

## 2022-05-21 MED ORDER — ACETAMINOPHEN 325 MG PO TABS
650.0000 mg | ORAL_TABLET | Freq: Four times a day (QID) | ORAL | Status: DC | PRN
  Administered 2022-05-25 – 2022-05-31 (×9): 650 mg via ORAL
  Filled 2022-05-21 (×9): qty 2

## 2022-05-21 MED ORDER — POLYETHYLENE GLYCOL 3350 17 G PO PACK
17.0000 g | PACK | Freq: Two times a day (BID) | ORAL | Status: DC
  Administered 2022-05-21 – 2022-06-05 (×23): 17 g via ORAL
  Filled 2022-05-21 (×25): qty 1

## 2022-05-21 MED ORDER — CARVEDILOL 12.5 MG PO TABS
12.5000 mg | ORAL_TABLET | Freq: Two times a day (BID) | ORAL | Status: DC
  Administered 2022-05-21 – 2022-05-30 (×20): 12.5 mg via ORAL
  Filled 2022-05-21 (×20): qty 1

## 2022-05-21 MED ORDER — ISOSORBIDE MONONITRATE 10 MG PO TABS
10.0000 mg | ORAL_TABLET | Freq: Every day | ORAL | Status: DC
  Administered 2022-05-21 – 2022-06-05 (×16): 10 mg via ORAL
  Filled 2022-05-21 (×17): qty 1

## 2022-05-21 NOTE — Progress Notes (Addendum)
[facility] Progress Note  CC:  N/V, ileus  Subjective:  Patient had large BMs 05/20/22 after receiving an enema and PO miralax.  Pulled NGT out overnight and would not allow the nurse to re-insert.  No vomiting since yesterday but says that he still feels nauseous.    Objective:  Vital signs in last 24 hours: Temp:  [98.2 F (36.8 C)-98.4 F (36.9 C)] 98.2 F (36.8 C) (11/02 0506) Pulse Rate:  [110-121] 110 (11/02 0506) Resp:  [14-20] 18 (11/02 0506) BP: (144-168)/(78-96) 152/83 (11/02 0506) SpO2:  [97 %-100 %] 99 % (11/02 0506) Weight:  [76.7 kg] 76.7 kg (11/02 0500) Last BM Date : 05/20/22 General:  Alert, frail appearing, NAD Heart:  Tachy; no murmurs. Pulm:  CTAB.  No W/R/R. Abdomen:  Soft, slightly distended.  BS present but quiet.  Diffuse TTP. Extremities:  Without edema.  Intake/Output from previous day: 11/01 0701 - 11/02 0700 In: 1346.2 [I.V.:1176.2; NG/GT:170] Out: 1600 [Urine:1200; Emesis/NG output:400]  Lab Results: Recent Labs    05/19/22 0915 05/19/22 1319 05/20/22 0617  WBC 5.5 5.0 7.4  HGB 11.2* 11.5* 9.2*  HCT 33.3* 36.1* 27.7*  PLT 354 320 234   BMET Recent Labs    05/19/22 1319 05/20/22 0617 05/21/22 0355  NA 139 139 141  K 4.2 3.8 3.7  CL 105 110 110  CO2 '24 23 22  '$ GLUCOSE 203* 153* 93  BUN 27* 27* 19  CREATININE 2.03* 1.61* 1.21  CALCIUM 9.1 8.1* 8.2*   LFT Recent Labs    05/20/22 0617  PROT 6.4*  ALBUMIN 3.0*  AST 22  ALT 34  ALKPHOS 72  BILITOT 1.1   DG Abd 1 View  Result Date: 05/20/2022 CLINICAL DATA:  [id number].  Check NGT placement. EXAM: ABDOMEN - 1 VIEW COMPARISON:  Flat plate abdomen yesterday at 9:26 p.m. FINDINGS: 4:08 a.m. The tip of the patient's NGT is well inside the stomach either in the distal body or antrum of the stomach. Large volume fecal stasis again noted without visible dilated small bowel. There are no supine findings of free air. IMPRESSION: 1. The tip of the patient's NGT is well  inside the stomach. 2. Large volume fecal stasis. Electronically Signed   By: Telford Nab M.D.   On: 05/20/2022 05:12   DG Abd 1 View  Result Date: 05/19/2022 CLINICAL DATA:  Nasogastric tube present. EXAM: ABDOMEN - 1 VIEW COMPARISON:  Radiograph earlier today. FINDINGS: Tip and side port of the enteric tube below the diaphragm in the stomach. Large volume of stool in the right colon. Gaseous distension of transverse and left colon. IMPRESSION: Tip and side port of the enteric tube below the diaphragm in the stomach. Electronically Signed   By: Keith Rake M.D.   On: 05/19/2022 22:05   DG Abd 1 View  Result Date: 05/19/2022 CLINICAL DATA:  NG tube placement. EXAM: ABDOMEN - 1 VIEW COMPARISON:  Earlier film, same date. FINDINGS: The NG tube tip is just below the GE junction and the proximal port is in the distal esophagus. This should be advanced several cm. Stable dilated colon. IMPRESSION: NG tube tip is just below the GE junction and the proximal port is in the distal esophagus. This should be advanced several cm. Electronically Signed   By: Marijo Sanes M.D.   On: 05/19/2022 21:02   DG Abd 1 View  Result Date: 05/19/2022 CLINICAL DATA:  Vomiting. EXAM: ABDOMEN - 1 VIEW COMPARISON:  None Available. FINDINGS: Dilated  colon is noted with moderate amount of stool present. No small bowel dilatation is noted. Vascular calcifications are noted. IMPRESSION: Dilated colon most consistent with ileus. Small bowel dilatation is noted. Moderate stool burden. Electronically Signed   By: Marijo Conception M.D.   On: 05/19/2022 15:36   DG Chest 1 View  Result Date: 05/19/2022 CLINICAL DATA:  Shortness of breath.  Vomiting. EXAM: CHEST  1 VIEW COMPARISON:  04/26/2022 FINDINGS: The cardiomediastinal contours are normal. Minor atelectasis in the lung bases. Pulmonary vasculature is normal. No consolidation, pleural effusion, or pneumothorax. No acute osseous abnormalities are seen. IMPRESSION: Minor  bibasilar atelectasis. Electronically Signed   By: Keith Rake M.D.   On: 05/19/2022 15:05    Assessment / Plan: # 71 yo male with Merkel cell carcinoma on Keytruda. Progressive disease on imaging. Oncology was trying to add another agent but couldn't get chemo 10/31 due to N/V/abdominal pain.    # Readmission for N/V  and abdominal pain.  Review of weights in Epic show significant weight loss of ~ 20 pounds. Symptoms possibly related to chemotherapy as well as severe gastritis (also had severe esophagitis, but that could have been secondary to all of the vomiting).  Takes Trulicity which can cause these symptoms but seems unlikely since he has been on it for over two years. Diabetic gastroparesis also seems unlikely. Hgb A1c was 5.2 last week. Currently, constipation could be contributing to symptoms. Large volume fecal stasis seen on KUB. Plan is to treat constipation.  Had a couple of large BMs after enemas and Miralax yesterday. Pulled NGT out overnight and would not allow nurse to re-insert. Need to change Miralax to PO BID if plan is to keep NGT out. Continue anti-emetics as needed.  Continue BID IV PPI.  Needs carafate suspension ACHS added back when taking PO.  Limit narcotics. Palliative Medicine has evaluated. Daughter not interested in Hospice.   # Abnormal imaging of rectum:  Last admission CT scan suggested right lateral rectal wall thickening, possibly due to underdistention vrs neoplasm. Flexible sigmoidoscopy 10/25 >> difficult visualization with stool in rectum but examined portion of rectum was normal. No proctitis or mass seen.    LOS: 2 days   Laban Emperor. Zehr  05/21/2022, 9:45 AM  ---------------------------------------------------------------------------------------------------------   I have taken a history, reviewed the chart and examined the patient. I performed a substantive portion of this encounter, including complete performance of at least one of the key  components, in conjunction with the APP. I agree with the APP's note, impression and recommendations  Patient reportedly had large bowel movements yesterday evening.  He pulled out his NG tube overnight.  Given his clinical improvement and refusal, NG tube was not replaced.  His nausea and abdominal pain seem to be improved from yesterday.  When I saw the patient today he had just finished his lunch and had eaten nearly all of it and was denying any nausea or vomiting.  His abdomen was soft, slightly distended, minimally tender to palpation with active bowel sounds.  Plan to continue MiraLAX twice daily Continue clear liquid diet for now Continue twice daily PPI, okay to add back Carafate Repeat plain film of the abdomen tomorrow  Junior Huezo E. Candis Schatz, MD [facility]

## 2022-05-21 NOTE — Progress Notes (Signed)
Upon entering room for 0000 vitals, patient found with NG tube in hand. Patient removed NG tube. Patient states he is leaving and " getting on out of here!" Redirected and patient remains agitated. HR is up. Patient reports pain, PRN administered. Will attempt to replace NG tube.

## 2022-05-21 NOTE — Progress Notes (Addendum)
PROGRESS NOTE  Antonio Guerra  GYK:[id number] DOB: April 27, 1951 DOA: 05/19/2022 PCP: [name], MD   Brief Narrative: Patient is a 71 year old male with history of Merkel Cell carcinoma, erosive esophagitis, CKD stage IIIb, diabetes type 2, chronic systolic heart failure, coronary artery disease who presented from home with complaint of diarrhea with nausea, vomiting.  He was admitted for the same a week ago.  Work-up was done with EGD at the time which showed severe esophagitis.  Patient was discharged on Carafate and Protonix.  His nausea and vomiting worsened after he was given chemotherapy infusion as an outpatient.  Patient was admitted for further management.  NG tube was placed on admission.  Assessment & Plan:  Principal Problem:   Intractable nausea and vomiting Active Problems:   Esophagitis, [place] grade D   Hyperlipidemia   Chronic systolic congestive heart failure (HCC)   Type 2 diabetes mellitus (HCC)   Stage 3a chronic kidney disease (HCC)   Merkel cell carcinoma (HCC)   Acute kidney injury superimposed on chronic kidney disease (HCC)   Ileus (HCC)   Normocytic anemia  Intractable nausea/vomiting: Was admitted for the same a week ago and was discharged on Carafate and Protonix after EGD showed severe esophagitis. Abdominal x-ray showed dilated small bowel, colon, stool burden.  GI consulted.  NG tube inserted after admission, kept n.p.o. Apparently the ileus has resolved.  He had a large bowel movement yesterday, he self remove the NG tube.  No plan for insertion, will check with GI if we can start on diet   Esophagitis: Recently diagnosed with EGD.  Continue PPI twice daily.On carafate at home  AKI on CKD stage IIIa: Baseline creatinine ranges from 1.3-1.8 .currently kidney function close to baseline   Merkel cell carcinoma: Follows with oncology, currently on chemotherapy.  Oncology aware.has a firm mass on the right groin  Diabetes type 2: Currently NPO.   Currently on sliding scale  insulin.  Monitor blood sugars  Hyperlipidemia: Home medication on hold  Normocytic anemia: Currently hemoglobin stable  Chronic systolic congestive heart failure: Currently on gentle IV fluids.  Last echo on 9/23 showed EF of 30 to 35%. On   coreg,imdur  Hypertension: Coreg and Imdur restarted.  Deconditioning/debility: We will request a PT assessment      Pressure Injury 05/12/22 Buttocks Left (Active)  05/12/22 2000  Location: Buttocks  Location Orientation: Left  Staging:   Wound Description (Comments):   Present on Admission: Yes  Dressing Type Foam - Lift dressing to assess site every shift 05/20/22 2145    DVT prophylaxis:SCDs Start: 05/19/22 1730     Code Status: Full Code  Family Communication: daughter at bedside on 11/1.Called again today, call not received  Patient status:Inpatient  Patient is from :Home  Anticipated discharge BL:[id number]  Estimated DC date:1-2 days   Consultants: GI  Procedures:None  Antimicrobials:  Anti-infectives (From admission, onward)    None       Subjective:  Patient seen and examined at bedside today.  He looks comfortable.He had a large BM yesterday.  He removed the NG tube by himself yesterday.  Abdomen is soft and nondistended and nontender.  Slow bowel sounds heard.  Denies any nausea or vomiting.  Objective: Vitals:   05/21/22 0015 05/21/22 0500 05/21/22 0506 05/21/22 1005  BP: (!) 168/96  (!) 152/83 (!) 150/91  Pulse: (!) 121  (!) 110 (!) 115  Resp: 19  18   Temp: 98.3 F (36.8 C)  98.2 F (36.8  C)   TempSrc: Axillary  Oral   SpO2: 99%  99%   Weight:  76.7 kg    Height:        Intake/Output Summary (Last 24 hours) at 05/21/2022 1053 Last data filed at 05/21/2022 0500 Gross per 24 hour  Intake 1346.23 ml  Output 1600 ml  Net -253.77 ml   Filed Weights   05/19/22 1743 05/21/22 0500  Weight: 74.7 kg 76.7 kg    Examination:  General exam: Overall comfortable, not in  distress,deconditioned ,weak HEENT: PERRL Respiratory system:  no wheezes or crackles  Cardiovascular system: S1 & S2 heard, RRR.  Gastrointestinal system: Abdomen is nondistended, soft and nontender.Sluggish bowel sounds Central nervous system: Alert and oriented Extremities: No edema, no clubbing ,no cyanosis, firm mass on the right groin Skin: No rashes, no ulcers,no icterus     Data Reviewed: I have personally reviewed following labs and imaging studies  CBC: Recent Labs  Lab 05/15/22 0351 05/19/22 0915 05/19/22 1319 05/20/22 0617  WBC 3.7* 5.5 5.0 7.4  NEUTROABS 2.0 3.4 3.4  --   HGB 9.4* 11.2* 11.5* 9.2*  HCT 28.6* 33.3* 36.1* 27.7*  MCV 84.4 82.8 87.0 84.7  PLT 233 354 320 778   Basic Metabolic Panel: Recent Labs  Lab 05/15/22 0351 05/19/22 0915 05/19/22 1319 05/20/22 0617 05/21/22 0355  NA 136 139 139 139 141  K 3.9 3.8 4.2 3.8 3.7  CL 106 105 105 110 110  CO2 '25 26 24 23 22  '$ GLUCOSE 106* 220* 203* 153* 93  BUN 11 25* 27* 27* 19  CREATININE 0.99 1.75* 2.03* 1.61* 1.21  CALCIUM 8.8* 9.3 9.1 8.1* 8.2*  MG 2.0  --  1.9  --   --      No results found for this or any previous visit (from the past 240 hour(s)).   Radiology Studies: DG Abd 1 View  Result Date: 05/20/2022 CLINICAL DATA:  [id number].  Check NGT placement. EXAM: ABDOMEN - 1 VIEW COMPARISON:  Flat plate abdomen yesterday at 9:26 p.m. FINDINGS: 4:08 a.m. The tip of the patient's NGT is well inside the stomach either in the distal body or antrum of the stomach. Large volume fecal stasis again noted without visible dilated small bowel. There are no supine findings of free air. IMPRESSION: 1. The tip of the patient's NGT is well inside the stomach. 2. Large volume fecal stasis. Electronically Signed   By: Telford Nab M.D.   On: 05/20/2022 05:12   DG Abd 1 View  Result Date: 05/19/2022 CLINICAL DATA:  Nasogastric tube present. EXAM: ABDOMEN - 1 VIEW COMPARISON:  Radiograph earlier today. FINDINGS: Tip  and side port of the enteric tube below the diaphragm in the stomach. Large volume of stool in the right colon. Gaseous distension of transverse and left colon. IMPRESSION: Tip and side port of the enteric tube below the diaphragm in the stomach. Electronically Signed   By: Keith Rake M.D.   On: 05/19/2022 22:05   DG Abd 1 View  Result Date: 05/19/2022 CLINICAL DATA:  NG tube placement. EXAM: ABDOMEN - 1 VIEW COMPARISON:  Earlier film, same date. FINDINGS: The NG tube tip is just below the GE junction and the proximal port is in the distal esophagus. This should be advanced several cm. Stable dilated colon. IMPRESSION: NG tube tip is just below the GE junction and the proximal port is in the distal esophagus. This should be advanced several cm. Electronically Signed   By: Marijo Sanes  M.D.   On: 05/19/2022 21:02   DG Abd 1 View  Result Date: 05/19/2022 CLINICAL DATA:  Vomiting. EXAM: ABDOMEN - 1 VIEW COMPARISON:  None Available. FINDINGS: Dilated colon is noted with moderate amount of stool present. No small bowel dilatation is noted. Vascular calcifications are noted. IMPRESSION: Dilated colon most consistent with ileus. Small bowel dilatation is noted. Moderate stool burden. Electronically Signed   By: Marijo Conception M.D.   On: 05/19/2022 15:36   DG Chest 1 View  Result Date: 05/19/2022 CLINICAL DATA:  Shortness of breath.  Vomiting. EXAM: CHEST  1 VIEW COMPARISON:  04/26/2022 FINDINGS: The cardiomediastinal contours are normal. Minor atelectasis in the lung bases. Pulmonary vasculature is normal. No consolidation, pleural effusion, or pneumothorax. No acute osseous abnormalities are seen. IMPRESSION: Minor bibasilar atelectasis. Electronically Signed   By: Keith Rake M.D.   On: 05/19/2022 15:05    Scheduled Meds:  carvedilol  12.5 mg Oral BID WC   insulin aspart  0-5 Units Subcutaneous QHS   insulin aspart  0-6 Units Subcutaneous TID WC   isosorbide mononitrate  10 mg Oral Daily    pantoprazole (PROTONIX) IV  40 mg Intravenous Q12H   polyethylene glycol  17 g Per NG tube BID   Continuous Infusions:  sodium chloride 75 mL/hr at 05/21/22 0028     LOS: 2 days   Shelly Coss, MD [facility] P11/08/2021, 10:53 AM

## 2022-05-21 NOTE — Progress Notes (Signed)
Patient cussing and covering nose. Patient not allowing nurse to reinsert NG tube as ordered. Patient denies nausea and has not vomited during shift. Patient had large BM 05/20/22 after receiving an enema and PO miralax. Patient states "he does not need the damn tube" and states "No". Provider on call notified. Decision to leave out NG tube unless N/V become an issue. Reading GI note, provider mentions possibility of removing NG tube. Will continue to monitor.

## 2022-05-21 NOTE — Progress Notes (Signed)
Mobility Specialist - Progress Note   05/21/22 1228  Mobility  Activity Transferred from bed to chair  Level of Assistance Maximum assist, patient does 25-49%  Assistive Device Front wheel walker  Range of Motion/Exercises Active  Activity Response Tolerated well  Mobility Referral Yes  $Mobility charge 1 Mobility   Pt was found in bed and agreeable to try ambulation. Pt was max-A for getting up from bed, got up on the third try. Was contact guard for getting to recliner chair and needed cues for sitting on the recliner chair. At EOS was left on recliner chair with necessities in reach.  Ferd Hibbs Mobility Specialist

## 2022-05-22 ENCOUNTER — Ambulatory Visit: Payer: Medicare (Managed Care)

## 2022-05-22 ENCOUNTER — Inpatient Hospital Stay (HOSPITAL_COMMUNITY): Payer: Medicare (Managed Care)

## 2022-05-22 DIAGNOSIS — R112 Nausea with vomiting, unspecified: Secondary | ICD-10-CM | POA: Diagnosis not present

## 2022-05-22 DIAGNOSIS — K567 Ileus, unspecified: Secondary | ICD-10-CM | POA: Diagnosis not present

## 2022-05-22 LAB — GLUCOSE, CAPILLARY
Glucose-Capillary: 112 mg/dL — ABNORMAL HIGH (ref 70–99)
Glucose-Capillary: 163 mg/dL — ABNORMAL HIGH (ref 70–99)
Glucose-Capillary: 149 mg/dL — ABNORMAL HIGH (ref 70–99)
Glucose-Capillary: 180 mg/dL — ABNORMAL HIGH (ref 70–99)

## 2022-05-22 MED ORDER — SUCRALFATE 1 GM/10ML PO SUSP
1.0000 g | Freq: Three times a day (TID) | ORAL | Status: DC
  Administered 2022-05-22 – 2022-06-05 (×52): 1 g via ORAL
  Filled 2022-05-22 (×53): qty 10

## 2022-05-22 MED ORDER — SORBITOL 70 % SOLN
960.0000 mL | TOPICAL_OIL | Freq: Once | ORAL | Status: AC
  Administered 2022-05-22: 960 mL via RECTAL
  Filled 2022-05-22: qty 240

## 2022-05-22 NOTE — Progress Notes (Signed)
PROGRESS NOTE  Antonio Guerra  YTK:[id number] DOB: 06-Dec-1950 DOA: 05/19/2022 PCP: [name], MD   Brief Narrative: Patient is a 71 year old male with history of Merkel Cell carcinoma, erosive esophagitis, CKD stage IIIb, diabetes type 2, chronic systolic heart failure, coronary artery disease who presented from home with complaint of diarrhea with nausea, vomiting.  He was admitted for the same a week ago.  Work-up was done with EGD at the time which showed severe esophagitis.  Patient was discharged on Carafate and Protonix.  His nausea and vomiting worsened after he was given chemotherapy infusion as an outpatient.  Patient was admitted for further management.  NG tube was placed on admission now removed.  Overall improvement in the clinical status.  Assessment & Plan:  Principal Problem:   Intractable nausea and vomiting Active Problems:   Esophagitis, [place] grade D   Hyperlipidemia   Chronic systolic congestive heart failure (HCC)   Type 2 diabetes mellitus (HCC)   Stage 3a chronic kidney disease (HCC)   Merkel cell carcinoma (HCC)   Acute kidney injury superimposed on chronic kidney disease (HCC)   Ileus (HCC)   Normocytic anemia  Intractable nausea/vomiting: Was admitted for the same a week ago and was discharged on Carafate and Protonix after EGD showed severe esophagitis. Abdominal x-ray showed dilated small bowel, colon, stool burden.  GI consulted.  NG tube inserted after admission, kept n.p.o. Apparently the ileus has resolved.  He had a large bowel movement on 11/1, he self removed the NG tube. Currently on clear liquid diet.  He denies any abdominal pain, nausea or vomiting and comfortable.  Abdominal x-ray done today showed persistent ileus, bowel sounds are very slow. GI planning for smog enema  Esophagitis: Recently diagnosed with EGD.  Continue PPI twice daily.On carafate at home  AKI on CKD stage IIIa: Baseline creatinine ranges from 1.3-1.8 .currently  kidney function close to baseline   Merkel cell carcinoma: Follows with oncology, currently on chemotherapy.  Oncology aware.has a firm mass on the right groin. As per oncology, palliative care consult has been requested.  Daughter not interested on hospice approach though.  Oncology thinks that the prognosis is very poor.  Diabetes type 2: On clear liquid.  Currently on sliding scale  insulin.  Monitor blood sugars  Hyperlipidemia: Home medication on hold  Normocytic anemia: Currently hemoglobin stable  Chronic systolic congestive heart failure: Currently on gentle IV fluids.  Last echo on 9/23 showed EF of 30 to 35%. On   coreg,imdur  Hypertension: Coreg and Imdur restarted.  Deconditioning/debility: We requested  a PT assessment      Pressure Injury 05/12/22 Buttocks Left (Active)  05/12/22 2000  Location: Buttocks  Location Orientation: Left  Staging:   Wound Description (Comments):   Present on Admission: Yes  Dressing Type Foam - Lift dressing to assess site every shift 05/22/22 1113    DVT prophylaxis:SCDs Start: 05/19/22 1730     Code Status: Full Code  Family Communication: daughter on phone on 11/3  Patient status:Inpatient  Patient is from :Home  Anticipated discharge XN:TZGY  Estimated DC date:1-2 days   Consultants: GI  Procedures:None  Antimicrobials:  Anti-infectives (From admission, onward)    None       Subjective:  Patient seen and examined at bedside today.  He was sitting in the chair.  Appears comfortable, denies abdomen pain, nausea or vomiting.  Tolerating clear liquid diet.  Objective: Vitals:   05/21/22 1700 05/21/22 2056 05/22/22 0500 05/22/22 0606  BP: (!) 141/79 (!) 146/78  (!) 156/82  Pulse: (!) 101 98  (!) 101  Resp: '19 17  18  '$ Temp: 98.4 F (36.9 C) 98.5 F (36.9 C)  97.9 F (36.6 C)  TempSrc: Oral Oral  Oral  SpO2: 100% 98%  100%  Weight:   78 kg   Height:        Intake/Output Summary (Last 24 hours) at  05/22/2022 1132 Last data filed at 05/22/2022 0912 Gross per 24 hour  Intake 1676.71 ml  Output 1200 ml  Net 476.71 ml   Filed Weights   05/21/22 0500 05/21/22 0902 05/22/22 0500  Weight: 76.7 kg 76.7 kg 78 kg    Examination:  General exam: Overall comfortable, not in distress, deconditioned, weak HEENT: PERRL Respiratory system:  no wheezes or crackles  Cardiovascular system: S1 & S2 heard, RRR.  Gastrointestinal system: Abdomen is mildly red, soft and nontender.  Bowel sounds not heard clearly  Central nervous system: Alert and oriented Extremities: No edema, no clubbing ,no cyanosis,lass on right groin Skin: No rashes, no ulcers,no icterus     Data Reviewed: I have personally reviewed following labs and imaging studies  CBC: Recent Labs  Lab 05/19/22 0915 05/19/22 1319 05/20/22 0617  WBC 5.5 5.0 7.4  NEUTROABS 3.4 3.4  --   HGB 11.2* 11.5* 9.2*  HCT 33.3* 36.1* 27.7*  MCV 82.8 87.0 84.7  PLT 354 320 347   Basic Metabolic Panel: Recent Labs  Lab 05/19/22 0915 05/19/22 1319 05/20/22 0617 05/21/22 0355  NA 139 139 139 141  K 3.8 4.2 3.8 3.7  CL 105 105 110 110  CO2 '26 24 23 22  '$ GLUCOSE 220* 203* 153* 93  BUN 25* 27* 27* 19  CREATININE 1.75* 2.03* 1.61* 1.21  CALCIUM 9.3 9.1 8.1* 8.2*  MG  --  1.9  --   --      No results found for this or any previous visit (from the past 240 hour(s)).   Radiology Studies: DG Abd 1 View  Result Date: 05/22/2022 CLINICAL DATA:  71 year old male with recent abdominal pain, nausea vomiting. Bowel dilatation since 05/19/2022 suspicious for ileus. EXAM: ABDOMEN - 1 VIEW COMPARISON:  05/20/2022 and earlier. FINDINGS: Portable AP view at 0436 hours. Enteric tube no longer visible. Redundant gas-filled large bowel loops throughout the abdomen and pelvis, including distal colon gas. Some superimposed retained stool. The pattern is not significantly changed from 05/19/2022, and increased from CT Abdomen and Pelvis 05/11/2022  although some gas-filled redundant large bowel loops were present at that time. No dilated small bowel identified. No pneumoperitoneum identified on these portable views. Lung bases appear negative. Stable visualized osseous structures. IMPRESSION: 1. Enteric tube no longer visible. 2. Large bowel ileus gas pattern not significantly improved from 05/19/2022. No small bowel dilatation to suggest a mechanical obstruction. Electronically Signed   By: Genevie Ann M.D.   On: 05/22/2022 06:41    Scheduled Meds:  carvedilol  12.5 mg Oral BID WC   insulin aspart  0-5 Units Subcutaneous QHS   insulin aspart  0-6 Units Subcutaneous TID WC   isosorbide mononitrate  10 mg Oral Daily   pantoprazole (PROTONIX) IV  40 mg Intravenous Q12H   polyethylene glycol  17 g Oral BID   sorbitol, milk of mag, mineral oil, glycerin (SMOG) enema  960 mL Rectal Once   sucralfate  1 g Oral TID WC & HS   Continuous Infusions:  sodium chloride 75 mL/hr at 05/22/22 0359  LOS: 3 days   Shelly Coss, MD [facility] P11/09/2021, 11:32 AM

## 2022-05-22 NOTE — Progress Notes (Signed)
SMOG enema performed on pt at 1330 this shift. It returned a large amount of liquid stool with very few pieces of solid stool. Pt tolerated the procedure well

## 2022-05-22 NOTE — Evaluation (Signed)
Physical Therapy Evaluation Patient Details Name: Antonio Guerra MRN: [id number] DOB: January 29, 1951 Today's Date: 05/22/2022  History of Present Illness  71 year old male with history of Merkel Cell carcinoma, erosive esophagitis, CKD stage IIIb, diabetes type 2, chronic systolic heart failure, coronary artery disease who presented from home with complaint of diarrhea with nausea, vomiting.  Recent admission last week for same symptoms.  Pt was having and still has pain in Right groin/leg (enlarged lymph node last admission, denies any falls).  Clinical Impression  Pt admitted with above diagnosis.  Pt currently with functional limitations due to the deficits listed below (see PT Problem List). Pt will benefit from skilled PT to increase their independence and safety with mobility to allow discharge to the venue listed below.  Pt agreeable to at least stand EOB however reports feeling poorly today.  Pt follows simple commands with encouragement and increased time.  Pt reports he lives in an apartment has 9 steps to perform twice to get inside.  When asked how he performs on them he states, "by the grace of God."  Pt with recent admission and SNF recommended.  Pt is part of [facility] program.  If pt does not d/c to SNF, he may need increased assist for mobility and care upon d/c home.        Recommendations for follow up therapy are one component of a multi-disciplinary discharge planning process, led by the attending physician.  Recommendations may be updated based on patient status, additional functional criteria and insurance authorization.  Follow Up Recommendations Skilled nursing-short term rehab (<3 hours/day) Can patient physically be transported by private vehicle: Yes    Assistance Recommended at Discharge Frequent or constant Supervision/Assistance  Patient can return home with the following  Assistance with cooking/housework;Assist for transportation;Help with stairs or ramp for entrance;A  little help with bathing/dressing/bathroom;A little help with walking and/or transfers    Equipment Recommendations None recommended by PT  Recommendations for Other Services       Functional Status Assessment Patient has had a recent decline in their functional status and demonstrates the ability to make significant improvements in function in a reasonable and predictable amount of time.     Precautions / Restrictions Precautions Precautions: Fall Restrictions Weight Bearing Restrictions: No      Mobility  Bed Mobility Overal bed mobility: Needs Assistance Bed Mobility: Supine to Sit, Sit to Supine     Supine to sit: Min assist Sit to supine: Min assist   General bed mobility comments: increased time and effort; light assist for trunk upright, assist for LEs onto bed    Transfers Overall transfer level: Needs assistance Equipment used: Rolling walker (2 wheels) Transfers: Sit to/from Stand Sit to Stand: Min assist           General transfer comment: pt held onto RW so held down RW for support, cues for weight shifting, light assist to rise and steady; pt with good controlled descent; pt took a couple side steps up [facility] min/guard assist and increased time    Ambulation/Gait                  Stairs            Wheelchair Mobility    Modified Rankin (Stroke Patients Only)       Balance Overall balance assessment: Needs assistance         Standing balance support: During functional activity, Reliant on assistive device for balance, Bilateral upper extremity supported Standing balance-Leahy Scale:  Poor                               Pertinent Vitals/Pain Pain Assessment Pain Assessment: Faces Faces Pain Scale: Hurts little more Pain Location: RLE and back Pain Descriptors / Indicators: Grimacing, Guarding, Discomfort Pain Intervention(s): Monitored during session, Repositioned    Home Living Family/patient expects to be  discharged to:: Private residence Living Arrangements: Parent Available Help at Discharge: Family;Available PRN/intermittently;Personal care attendant Type of Home: Apartment Home Access: Stairs to enter Entrance Stairs-Rails: Right;Left Entrance Stairs-Number of Steps: 2 flights (9)   Home Layout: One level Home Equipment: Conservation officer, nature (2 wheels);Grab bars - tub/shower Additional Comments: aide in the morning for 3 hours M-F and daughter helps on weekend with all mobility and ADL's. Pt reports he uses RW to move around home when no-one is there to help.    Prior Function Prior Level of Function : Needs assist;History of Falls (last six months)       Physical Assist : Mobility (physical);ADLs (physical) Mobility (physical): Transfers;Bed mobility;Gait ADLs (physical): IADLs;Toileting;Dressing;Bathing Mobility Comments: uses RW for standing, ambs with walker in home for short distances ADLs Comments: home aide helps him shower and get out of bed in the morning, does birth/sink bath or sponge bath, helps him dress, and assists with hygiene. Daughter will assist when home from work.     Hand Dominance        Extremity/Trunk Assessment   Upper Extremity Assessment Upper Extremity Assessment: Generalized weakness    Lower Extremity Assessment Lower Extremity Assessment: Generalized weakness    Cervical / Trunk Assessment Cervical / Trunk Assessment: Normal  Communication   Communication: Expressive difficulties (difficult to understand at times, hoarse voice)  Cognition Arousal/Alertness: Awake/alert Behavior During Therapy: Flat affect Overall Cognitive Status: No family/caregiver present to determine baseline cognitive functioning                                 General Comments: Pt pleasant, able to follow commands, minimally conversational        General Comments      Exercises     Assessment/Plan    PT Assessment Patient needs continued PT  services  PT Problem List Decreased strength;Decreased activity tolerance;Decreased balance;Decreased mobility;Decreased cognition;Decreased knowledge of use of DME;Decreased knowledge of precautions;Pain       PT Treatment Interventions DME instruction;Gait training;Stair training;Functional mobility training;Therapeutic activities;Therapeutic exercise;Balance training;Patient/family education    PT Goals (Current goals can be found in the Care Plan section)  Acute Rehab PT Goals PT Goal Formulation: With patient Time For Goal Achievement: 06/05/22 Potential to Achieve Goals: Fair    Frequency Min 2X/week     Co-evaluation               AM-PAC PT "6 Clicks" Mobility  Outcome Measure Help needed turning from your back to your side while in a flat bed without using bedrails?: A Little Help needed moving from lying on your back to sitting on the side of a flat bed without using bedrails?: A Little Help needed moving to and from a bed to a chair (including a wheelchair)?: A Lot Help needed standing up from a chair using your arms (e.g., wheelchair or bedside chair)?: A Lot Help needed to walk in hospital room?: A Lot Help needed climbing 3-5 steps with a railing? : Total 6 Click Score: 13  End of Session Equipment Utilized During Treatment: Gait belt Activity Tolerance: Patient tolerated treatment well Patient left: in bed;with call bell/phone within reach   PT Visit Diagnosis: Other abnormalities of gait and mobility (R26.89);Muscle weakness (generalized) (M62.81)    Time: [id number] PT Time Calculation (min) (ACUTE ONLY): 17 min   Charges:   PT Evaluation $PT Eval Low Complexity: 1 Low         Kati PT, DPT Physical Therapist Acute Rehabilitation Services Preferred contact method: Secure Chat Weekend Pager Only: [phone] Office: [place] 05/22/2022, 3:03 PM

## 2022-05-22 NOTE — Progress Notes (Signed)
Mobility Specialist - Progress Note   05/22/22 0912  Mobility  Activity Transferred from bed to chair  Level of Assistance +2 (takes two people)  Games developer wheel walker  Range of Motion/Exercises Active  Activity Response Tolerated well  Mobility Referral No  $Mobility charge 1 Mobility   NT request assistance in transferring pt to recliner chair and at EOS was left with NT in room.  Ferd Hibbs Mobility Specialist

## 2022-05-22 NOTE — Progress Notes (Addendum)
[facility] Progress Note  CC:  N/V, ileus   Subjective:  X-ray today without improvement in colonic ileus.  No further BMs since enemas on 11/1.  No nausea or vomiting but still complaining of abdominal pain.  Looks better today, was just sitting up in the chair for an hour.  Objective:  Vital signs in last 24 hours: Temp:  [97.9 F (36.6 C)-98.5 F (36.9 C)] 97.9 F (36.6 C) (11/03 0606) Pulse Rate:  [98-119] 101 (11/03 0606) Resp:  [16-19] 18 (11/03 0606) BP: (115-156)/(72-87) 156/82 (11/03 0606) SpO2:  [98 %-100 %] 100 % (11/03 0606) Weight:  [78 kg] 78 kg (11/03 0500) Last BM Date : 05/20/22 General:  Alert, frail, in NAD; sitting up on the side of the bed Heart:  Regular rate and rhythm; no murmurs Pulm:  CTAB.  No W/R/R. Abdomen:  Soft, slightly distended.  BS present but quiet.  Diffuse TTP. Extremities:  Without edema. Neurologic:  Alert and oriented x 4;  grossly normal neurologically.  Intake/Output from previous day: 11/02 0701 - 11/03 0700 In: 1316.7 [P.O.:120; I.V.:1196.7] Out: 1000 [Urine:1000]  Lab Results: Recent Labs    05/19/22 1319 05/20/22 0617  WBC 5.0 7.4  HGB 11.5* 9.2*  HCT 36.1* 27.7*  PLT 320 234   BMET Recent Labs    05/19/22 1319 05/20/22 0617 05/21/22 0355  NA 139 139 141  K 4.2 3.8 3.7  CL 105 110 110  CO2 '24 23 22  '$ GLUCOSE 203* 153* 93  BUN 27* 27* 19  CREATININE 2.03* 1.61* 1.21  CALCIUM 9.1 8.1* 8.2*   LFT Recent Labs    05/20/22 0617  PROT 6.4*  ALBUMIN 3.0*  AST 22  ALT 34  ALKPHOS 72  BILITOT 1.1   DG Abd 1 View  Result Date: 05/22/2022 CLINICAL DATA:  71 year old male with recent abdominal pain, nausea vomiting. Bowel dilatation since 05/19/2022 suspicious for ileus. EXAM: ABDOMEN - 1 VIEW COMPARISON:  05/20/2022 and earlier. FINDINGS: Portable AP view at 0436 hours. Enteric tube no longer visible. Redundant gas-filled large bowel loops throughout the abdomen and pelvis, including distal  colon gas. Some superimposed retained stool. The pattern is not significantly changed from 05/19/2022, and increased from CT Abdomen and Pelvis 05/11/2022 although some gas-filled redundant large bowel loops were present at that time. No dilated small bowel identified. No pneumoperitoneum identified on these portable views. Lung bases appear negative. Stable visualized osseous structures. IMPRESSION: 1. Enteric tube no longer visible. 2. Large bowel ileus gas pattern not significantly improved from 05/19/2022. No small bowel dilatation to suggest a mechanical obstruction. Electronically Signed   By: Genevie Ann M.D.   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 Progressive disease on imaging. Oncology was trying to add another agent but couldn't get chemo 10/31 due to N/V/abdominal pain.    # Readmission for N/V  and abdominal pain.  Review of weights in Epic show significant weight loss of ~ 20 pounds. Symptoms possibly related to chemotherapy as well as severe gastritis (also had severe esophagitis, but that could have been secondary to all of the vomiting).  Takes Trulicity which can cause these symptoms but seems unlikely since he has been on it for over two years. Diabetic gastroparesis also seems unlikely. Hgb A1c was 5.2 last week. Currently, constipation could be contributing to symptoms. Large volume fecal stasis seen on KUB.   Plan is to treat constipation.  Had a couple of large BMs after enemas and Miralax 11/1. Pulled NGT out overnight 11/1-11/2 and would not allow nurse to re-insert. X-ray this AM, 11/3, without improvement in large bowel ileus pattern.  Will order a SMOG enema for today. Continue Miralax BID. Continue anti-emetics as needed.  Continue BID IV PPI.  I have added carafate suspension ACHS back to his regimen. Limit narcotics.  OOB when possible. Palliative Medicine has evaluated. Daughter not interested in Hospice.    #  Abnormal imaging of rectum:  Last admission CT scan suggested right lateral rectal wall thickening, possibly due to underdistention vrs neoplasm. Flexible sigmoidoscopy 10/25 >> difficult visualization with stool in rectum but examined portion of rectum was normal. No proctitis or mass seen.    LOS: 3 days   Laban Emperor. Zehr  05/22/2022, 10:46 AM  -------------------------------------------------------------------------------------  I have taken a history, reviewed the chart and examined the patient. I performed a substantive portion of this encounter, including complete performance of at least one of the key components, in conjunction with the APP. I agree with the APP's note, impression and recommendations  Patient's plain film this morning showed continued colonic distention suggestive of ileus/Olgivies.  Clinically, he is about the same as yesterday, tolerating PO with no nausea or vomiting, but continued abdominal tenderness.  He received a SMOG enema this afternoon and has had numerous large liquid bowel movements since. His physical exam is reassuring with a soft, mildly distended abdomen with active bowel sounds  Given improvement in PO tolerance and successful bowel movements, will hold off on other therapies such as neostigmine or endoscopic decompression for now. Continue CLD Please avoid narcotics as this will inhibit return of normal gut motility Consider IV acetaminophen or toradol for extra pain Continue to encourage out of bed activity  Karisma Meiser E. Candis Schatz, MD [facility]

## 2022-05-23 ENCOUNTER — Ambulatory Visit: Payer: Medicare (Managed Care)

## 2022-05-23 DIAGNOSIS — K5981 Ogilvie syndrome: Secondary | ICD-10-CM | POA: Diagnosis not present

## 2022-05-23 DIAGNOSIS — R112 Nausea with vomiting, unspecified: Secondary | ICD-10-CM | POA: Diagnosis not present

## 2022-05-23 LAB — BASIC METABOLIC PANEL
Anion gap: 7 (ref 5–15)
BUN: 8 mg/dL (ref 8–23)
CO2: 20 mmol/L — ABNORMAL LOW (ref 22–32)
Calcium: 8.2 mg/dL — ABNORMAL LOW (ref 8.9–10.3)
Chloride: 113 mmol/L — ABNORMAL HIGH (ref 98–111)
Creatinine, Ser: 1.06 mg/dL (ref 0.61–1.24)
GFR, Estimated: >60 mL/min (ref >60)
Glucose, Bld: 153 mg/dL — ABNORMAL HIGH (ref 70–99)
Potassium: 3.3 mmol/L — ABNORMAL LOW (ref 3.5–5.1)
Sodium: 140 mmol/L (ref 135–145)

## 2022-05-23 LAB — CBC
HCT: 27.1 % — ABNORMAL LOW (ref 39.0–52.0)
Hemoglobin: 8.9 g/dL — ABNORMAL LOW (ref 13.0–17.0)
MCH: 27.5 pg (ref 26.0–34.0)
MCHC: 32.8 g/dL (ref 30.0–36.0)
MCV: 83.6 fL (ref 80.0–100.0)
Platelets: 253 10*3/uL (ref 150–400)
RBC: 3.24 MIL/uL — ABNORMAL LOW (ref 4.22–5.81)
RDW: 15.3 % (ref 11.5–15.5)
WBC: 5.7 10*3/uL (ref 4.0–10.5)
nRBC: 0 % (ref 0.0–0.2)

## 2022-05-23 LAB — GLUCOSE, CAPILLARY
Glucose-Capillary: 149 mg/dL — ABNORMAL HIGH (ref 70–99)
Glucose-Capillary: 145 mg/dL — ABNORMAL HIGH (ref 70–99)
Glucose-Capillary: 114 mg/dL — ABNORMAL HIGH (ref 70–99)
Glucose-Capillary: 142 mg/dL — ABNORMAL HIGH (ref 70–99)

## 2022-05-23 LAB — MRSA NEXT GEN BY PCR, NASAL: MRSA by PCR Next Gen: DETECTED — AB

## 2022-05-23 MED ORDER — ATROPINE SULFATE 1 MG/10ML IJ SOSY
1.0000 mg | PREFILLED_SYRINGE | INTRAMUSCULAR | Status: DC | PRN
  Filled 2022-05-23 (×2): qty 10

## 2022-05-23 MED ORDER — OXYCODONE HCL 5 MG PO TABS
5.0000 mg | ORAL_TABLET | Freq: Four times a day (QID) | ORAL | Status: DC | PRN
  Administered 2022-05-24 – 2022-06-04 (×15): 5 mg via ORAL
  Filled 2022-05-23 (×15): qty 1

## 2022-05-23 MED ORDER — NEOSTIGMINE METHYLSULFATE 10 MG/10ML IV SOLN: 3.0000 mg | Freq: Once | INTRAVENOUS | Status: DC

## 2022-05-23 MED ORDER — POTASSIUM CHLORIDE 10 MEQ/100ML IV SOLN
10.0000 meq | INTRAVENOUS | Status: AC
  Administered 2022-05-23 (×4): 10 meq via INTRAVENOUS
  Filled 2022-05-23 (×4): qty 100

## 2022-05-23 MED ORDER — NEOSTIGMINE METHYLSULFATE 10 MG/10ML IV SOLN
3.0000 mg | Freq: Once | INTRAVENOUS | Status: DC
  Filled 2022-05-23: qty 3

## 2022-05-23 MED ORDER — ORAL CARE MOUTH RINSE: 15.0000 mL | OROMUCOSAL | Status: DC | PRN

## 2022-05-23 MED ORDER — NEOSTIGMINE METHYLSULFATE 10 MG/10ML IV SOLN
2.0000 mg | Freq: Once | INTRAVENOUS | Status: AC
  Administered 2022-05-23: 2 mg via INTRAVENOUS
  Filled 2022-05-23: qty 2

## 2022-05-23 MED ORDER — CHLORHEXIDINE GLUCONATE CLOTH 2 % EX PADS
6.0000 | MEDICATED_PAD | Freq: Every day | CUTANEOUS | Status: DC
  Administered 2022-05-23 – 2022-06-05 (×5): 6 via TOPICAL

## 2022-05-23 NOTE — Progress Notes (Signed)
PROGRESS NOTE  Franchot Pollitt  KXF:[id number] DOB: [birth year] DOA: 05/19/2022 PCP: [name], MD   Brief Narrative: Patient is a 71 year old male with history of Merkel Cell carcinoma, erosive esophagitis, CKD stage IIIb, diabetes type 2, chronic systolic heart failure, coronary artery disease who presented from home with complaint of diarrhea with nausea, vomiting.  He was admitted for the same a week ago.  Work-up was done with EGD at the time which showed severe esophagitis.  Patient was discharged on Carafate and Protonix.  His nausea and vomiting worsened after he was given chemotherapy infusion as an outpatient.  Patient was admitted for further management.  Hospital course notable for persistent ileus, plan for neostigmine administration  Assessment & Plan:  Principal Problem:   Intractable nausea and vomiting Active Problems:   Esophagitis, [place] grade D   Hyperlipidemia   Chronic systolic congestive heart failure (HCC)   Type 2 diabetes mellitus (HCC)   Stage 3a chronic kidney disease (HCC)   Merkel cell carcinoma (HCC)   Acute kidney injury superimposed on chronic kidney disease (HCC)   Ileus (HCC)   Normocytic anemia  Intractable nausea/vomiting/Ogilvie's syndrome: Was admitted for the same a week ago and was discharged on Carafate and Protonix after EGD showed severe esophagitis. Abdominal x-ray showed dilated small bowel, colon, stool burden.  GI consulted.  NG tube inserted after admission, kept n.p.o.   He had a large bowel movement on 11/1, he self removed the NG tube. Currently on clear liquid diet. Last  Abdominal x-ray done today showed persistent ileus, bowel sounds are very slow.  Given enema with liquidy stool on 11/3 but he complains of abdominal pain today, abdomen is slightly distended.  Gastroenterology planning for neostigmine administration but its shd be done preferably in step down/icu with atropine ready at bedside. No nausea ,vomiting  today.  Esophagitis: Recently diagnosed with EGD.  Continue PPI twice daily.On carafate at home  AKI on CKD stage IIIa: Baseline creatinine ranges from 1.3-1.8 .currently kidney function close to baseline   Merkel cell carcinoma: Follows with oncology, currently on chemotherapy.  Oncology aware.has a firm mass on the right groin. As per oncology, palliative care consult has been requested.  Daughter not interested on hospice approach though.  Oncology thinks that the prognosis is very poor.  Diabetes type 2: On clear liquid.  Currently on sliding scale  insulin.  Monitor blood sugars  Hyperlipidemia: Home medication on hold  Normocytic anemia: Currently hemoglobin stable  Chronic systolic congestive heart failure: Currently on gentle IV fluids.  Last echo on 9/23 showed EF of 30 to 35%. On   coreg,imdur  Hypertension: Coreg and Imdur restarted.  Deconditioning/debility: We requested  a PT assessment,recommended SNF      Pressure Injury 05/12/22 Buttocks Left (Active)  05/12/22 2000  Location: Buttocks  Location Orientation: Left  Staging:   Wound Description (Comments):   Present on Admission: Yes  Dressing Type Foam - Lift dressing to assess site every shift 05/23/22 0927    DVT prophylaxis:SCDs Start: 05/19/22 1730     Code Status: Full Code  Family Communication: daughter on phone on 11/3  Patient status:Inpatient  Patient is from :Home  Anticipated discharge IR:CVEL  Estimated DC date:1-2 days   Consultants: GI  Procedures:None  Antimicrobials:  Anti-infectives (From admission, onward)    None       Subjective:  Patient seen and examined at bedside today.  Complains of abdominal discomfort.  Abdomen slightly distended, bowel sounds not clearly  heard.  Denies any nausea or vomiting.  Objective: Vitals:   05/22/22 1801 05/22/22 2142 05/23/22 0500 05/23/22 0516  BP: 127/85 (!) 148/79  138/78  Pulse: 91 (!) 109  (!) 102  Resp:  17  17  Temp:   98.7 F (37.1 C)  98.5 F (36.9 C)  TempSrc:  Oral  Oral  SpO2:  99%  97%  Weight:   77.2 kg   Height:        Intake/Output Summary (Last 24 hours) at 05/23/2022 1334 Last data filed at 05/22/2022 1402 Gross per 24 hour  Intake 1501.89 ml  Output --  Net 1501.89 ml   Filed Weights   05/21/22 0902 05/22/22 0500 05/23/22 0500  Weight: 76.7 kg 78 kg 77.2 kg    Examination:  General exam: Chronically ill looking, weak, deconditioned  HEENT: PERRL Respiratory system:  no wheezes or crackles  Cardiovascular system: S1 & S2 heard, RRR.  Gastrointestinal system: Abdomen is distended, soft ,has mild generalised tenderness Central nervous system: Alert and oriented Extremities: No edema, no clubbing ,no cyanosis,right groin mass Skin: No rashes, no ulcers,no icterus      Data Reviewed: I have personally reviewed following labs and imaging studies  CBC: Recent Labs  Lab 05/19/22 0915 05/19/22 1319 05/20/22 0617 05/23/22 0508  WBC 5.5 5.0 7.4 5.7  NEUTROABS 3.4 3.4  --   --   HGB 11.2* 11.5* 9.2* 8.9*  HCT 33.3* 36.1* 27.7* 27.1*  MCV 82.8 87.0 84.7 83.6  PLT 354 320 234 756   Basic Metabolic Panel: Recent Labs  Lab 05/19/22 0915 05/19/22 1319 05/20/22 0617 05/21/22 0355 05/23/22 0413  NA 139 139 139 141 140  K 3.8 4.2 3.8 3.7 3.3*  CL 105 105 110 110 113*  CO2 '26 24 23 22 '$ 20*  GLUCOSE 220* 203* 153* 93 153*  BUN 25* 27* 27* 19 8  CREATININE 1.75* 2.03* 1.61* 1.21 1.06  CALCIUM 9.3 9.1 8.1* 8.2* 8.2*  MG  --  1.9  --   --   --      No results found for this or any previous visit (from the past 240 hour(s)).   Radiology Studies: DG Abd 1 View  Result Date: 05/22/2022 CLINICAL DATA:  71 year old male with recent abdominal pain, nausea vomiting. Bowel dilatation since 05/19/2022 suspicious for ileus. EXAM: ABDOMEN - 1 VIEW COMPARISON:  05/20/2022 and earlier. FINDINGS: Portable AP view at 0436 hours. Enteric tube no longer visible. Redundant gas-filled large  bowel loops throughout the abdomen and pelvis, including distal colon gas. Some superimposed retained stool. The pattern is not significantly changed from 05/19/2022, and increased from CT Abdomen and Pelvis 05/11/2022 although some gas-filled redundant large bowel loops were present at that time. No dilated small bowel identified. No pneumoperitoneum identified on these portable views. Lung bases appear negative. Stable visualized osseous structures. IMPRESSION: 1. Enteric tube no longer visible. 2. Large bowel ileus gas pattern not significantly improved from 05/19/2022. No small bowel dilatation to suggest a mechanical obstruction. Electronically Signed   By: Genevie Ann M.D.   On: 05/22/2022 06:41    Scheduled Meds:  carvedilol  12.5 mg Oral BID WC   insulin aspart  0-5 Units Subcutaneous QHS   insulin aspart  0-6 Units Subcutaneous TID WC   isosorbide mononitrate  10 mg Oral Daily   pantoprazole (PROTONIX) IV  40 mg Intravenous Q12H   polyethylene glycol  17 g Oral BID   sucralfate  1 g Oral TID WC &  HS   Continuous Infusions:  sodium chloride 75 mL/hr at 05/23/22 0706     LOS: 4 days   Shelly Coss, MD [facility] P11/10/2021, 1:34 PM

## 2022-05-23 NOTE — Progress Notes (Signed)
HR to 69 after neostigmine injection 2 mg. Patient complaint of nausea and vomiting. Compazine per order.

## 2022-05-23 NOTE — Progress Notes (Signed)
[facility] ROUNDING NOTE   Subjective: Patient had numerous large volume liquid bowel movements following his enema yesterday, but has not experienced much clinical improvement.  He reports continue diffuse abdominal discomfort and no appetite.  No vomiting, but he has no desire to eat.  Plain film yesterday with markedly distended loops of colon.   Objective: Vital signs in last 24 hours: Temp:  [98 F (36.7 C)-98.7 F (37.1 C)] 98.5 F (36.9 C) (11/04 0516) Pulse Rate:  [91-109] 102 (11/04 0516) Resp:  [17-18] 17 (11/04 0516) BP: (127-161)/(78-85) 138/78 (11/04 0516) SpO2:  [97 %-99 %] 97 % (11/04 0516) Weight:  [77.2 kg] 77.2 kg (11/04 0500) Last BM Date : 05/23/22 General: Chronically ill appearing, uncomfortable Af Am male Lungs:  CTA b/l, no w/r/r Heart:  RRR, no m/r/g Abdomen:  Soft, distended but not markedly so, diffuse tenderness to light palpation without rigidity or guarding, no bowel sounds appreciated    Intake/Output from previous day: 11/03 0701 - 11/04 0700 In: 1861.9 [P.O.:360; I.V.:1501.9] Out: 450 [Urine:450] Intake/Output this shift: No intake/output data recorded.   Lab Results: Recent Labs    05/23/22 0508  WBC 5.7  HGB 8.9*  PLT 253  MCV 83.6   BMET Recent Labs    05/21/22 0355 05/23/22 0413  NA 141 140  K 3.7 3.3*  CL 110 113*  CO2 22 20*  GLUCOSE 93 153*  BUN 19 8  CREATININE 1.21 1.06  CALCIUM 8.2* 8.2*   LFT No results for input(s): "PROT", "ALBUMIN", "AST", "ALT", "ALKPHOS", "BILITOT", "BILIDIR", "IBILI" in the last 72 hours. PT/INR No results for input(s): "INR" in the last 72 hours.    Imaging/Other results: DG Abd 1 View  Result Date: 05/22/2022 CLINICAL DATA:  71 year old male with recent abdominal pain, nausea vomiting. Bowel dilatation since 05/19/2022 suspicious for ileus. EXAM: ABDOMEN - 1 VIEW COMPARISON:  05/20/2022 and earlier. FINDINGS: Portable AP view at 0436 hours. Enteric tube no longer  visible. Redundant gas-filled large bowel loops throughout the abdomen and pelvis, including distal colon gas. Some superimposed retained stool. The pattern is not significantly changed from 05/19/2022, and increased from CT Abdomen and Pelvis 05/11/2022 although some gas-filled redundant large bowel loops were present at that time. No dilated small bowel identified. No pneumoperitoneum identified on these portable views. Lung bases appear negative. Stable visualized osseous structures. IMPRESSION: 1. Enteric tube no longer visible. 2. Large bowel ileus gas pattern not significantly improved from 05/19/2022. No small bowel dilatation to suggest a mechanical obstruction. Electronically Signed   By: Genevie Ann M.D.   On: 05/22/2022 06:41      Assessment and Plan:  71 year old male with Merkel cell carcinoma, treated with Keytruda, with several weeks of abdominal pain, nausea, vomiting, PO intolerance and weight loss.  Previous imaging had shown extensive stool burden throughout with mild colonic and small bowel dilation suggestive of constipation with mild ileus.  EGD with severe esophagitis, likely from repetitive emesis. His imaging and symptoms have become more consistent with an [place] presentation, with marked colonic distention without much small bowel dilation.  His abdomen is soft, but distended and tender and bowel sounds were not appreciated.  No longer vomiting, but he has no appetite. Although he initially showed clinical improvement with Miralax and enemas, his symptoms did not improve following an enema yesterday with numerous liquid stools.  Given failure of conservative management, I recommend we proceed with neostigmine.  Recommend administering 2 mg IV over 5 minutes. Due to  risk of bradycardia, patient will need to be under continuous EKG monitoring, with frequent vitals for 15 to 30 minutes following administration.  Atropine should be available at bedside for treatment of bradycardia.    Preferrably, this should be done in a step down or ICU bed.  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 - Avoid/limit narcotics and anticholinergic drugs - Will make NPO except sips/chips - Maintenance IVF to maintain hydration - Correct electrolyte imbalances (hypokalemia) - Continue to encourage mobility, out of bed activity, rotating patient on left and right sides    Daryel November, MD  05/23/2022, 11:13 AM [facility]

## 2022-05-24 ENCOUNTER — Inpatient Hospital Stay (HOSPITAL_COMMUNITY): Payer: Medicare (Managed Care)

## 2022-05-24 DIAGNOSIS — Z515 Encounter for palliative care: Secondary | ICD-10-CM | POA: Diagnosis not present

## 2022-05-24 DIAGNOSIS — Z7189 Other specified counseling: Secondary | ICD-10-CM | POA: Diagnosis not present

## 2022-05-24 DIAGNOSIS — K5981 Ogilvie syndrome: Secondary | ICD-10-CM | POA: Diagnosis not present

## 2022-05-24 DIAGNOSIS — E860 Dehydration: Secondary | ICD-10-CM | POA: Diagnosis not present

## 2022-05-24 DIAGNOSIS — R112 Nausea with vomiting, unspecified: Secondary | ICD-10-CM | POA: Diagnosis not present

## 2022-05-24 LAB — BASIC METABOLIC PANEL
Anion gap: 7 (ref 5–15)
BUN: 6 mg/dL — ABNORMAL LOW (ref 8–23)
CO2: 20 mmol/L — ABNORMAL LOW (ref 22–32)
Calcium: 7.9 mg/dL — ABNORMAL LOW (ref 8.9–10.3)
Chloride: 110 mmol/L (ref 98–111)
Creatinine, Ser: 1 mg/dL (ref 0.61–1.24)
GFR, Estimated: >60 mL/min (ref >60)
Glucose, Bld: 112 mg/dL — ABNORMAL HIGH (ref 70–99)
Potassium: 3.2 mmol/L — ABNORMAL LOW (ref 3.5–5.1)
Sodium: 137 mmol/L (ref 135–145)

## 2022-05-24 LAB — GLUCOSE, CAPILLARY
Glucose-Capillary: 115 mg/dL — ABNORMAL HIGH (ref 70–99)
Glucose-Capillary: 113 mg/dL — ABNORMAL HIGH (ref 70–99)
Glucose-Capillary: 103 mg/dL — ABNORMAL HIGH (ref 70–99)
Glucose-Capillary: 139 mg/dL — ABNORMAL HIGH (ref 70–99)
Glucose-Capillary: 154 mg/dL — ABNORMAL HIGH (ref 70–99)

## 2022-05-24 MED ORDER — POTASSIUM CHLORIDE 10 MEQ/100ML IV SOLN
10.0000 meq | INTRAVENOUS | Status: AC
  Administered 2022-05-24 (×4): 10 meq via INTRAVENOUS
  Filled 2022-05-24 (×4): qty 100

## 2022-05-24 MED ORDER — HYDRALAZINE HCL 20 MG/ML IJ SOLN
10.0000 mg | Freq: Once | INTRAMUSCULAR | Status: AC
  Administered 2022-05-24: 10 mg via INTRAVENOUS
  Filled 2022-05-24: qty 1

## 2022-05-24 MED ORDER — LIDOCAINE 5 % EX PTCH
1.0000 | MEDICATED_PATCH | CUTANEOUS | Status: DC
  Administered 2022-05-24 – 2022-06-04 (×12): 1 via TRANSDERMAL
  Filled 2022-05-24 (×12): qty 1

## 2022-05-24 MED ORDER — GUAIFENESIN-DM 100-10 MG/5ML PO SYRP
5.0000 mL | ORAL_SOLUTION | ORAL | Status: DC | PRN
  Administered 2022-05-24 – 2022-05-31 (×5): 5 mL via ORAL
  Filled 2022-05-24 (×5): qty 10

## 2022-05-24 NOTE — Progress Notes (Signed)
[facility] ROUNDING NOTE   Subjective: Patient transferred to ICU yesterday evening and received 2 mg of neostigmine.  Per the nursing, patient experienced multiple loose bowel movements last night. Today, the patient states that he feels about the same.  Continues to complain of pain in his leg and his abdomen.  No nausea or vomiting.   Objective: Vital signs in last 24 hours: Temp:  [97.7 F (36.5 C)-98.6 F (37 C)] 97.7 F (36.5 C) (11/05 0819) Pulse Rate:  [69-111] 84 (11/05 0800) Resp:  [11-31] 25 (11/05 0800) BP: (130-181)/(67-90) 161/81 (11/05 0800) SpO2:  [94 %-100 %] 100 % (11/05 0800) Weight:  [77.2 kg] 77.2 kg (11/05 0500) Last BM Date : 05/24/22 General: NAD, chronically ill appearing and deconditioned African-American male Lungs:  CTA b/l, no w/r/r Heart:  RRR, no m/r/g Abdomen:  Soft, diffuse tenderness to palpation, minimally distended, bowel sounds hypoactive but present and normal in pitch Ext:  No c/c/e    Intake/Output from previous day: 11/04 0701 - 11/05 0700 In: 2126 [I.V.:1825.4; IV Piggyback:300.6] Out: -  Intake/Output this shift: No intake/output data recorded.   Lab Results: Recent Labs    05/23/22 0508  WBC 5.7  HGB 8.9*  PLT 253  MCV 83.6   BMET Recent Labs    05/23/22 0413 05/24/22 0258  NA 140 137  K 3.3* 3.2*  CL 113* 110  CO2 20* 20*  GLUCOSE 153* 112*  BUN 8 6*  CREATININE 1.06 1.00  CALCIUM 8.2* 7.9*   LFT No results for input(s): "PROT", "ALBUMIN", "AST", "ALT", "ALKPHOS", "BILITOT", "BILIDIR", "IBILI" in the last 72 hours. PT/INR No results for input(s): "INR" in the last 72 hours.    Imaging/Other results: No results found.    Assessment and Plan: 71 year old male with Merkel cell carcinoma, treated with Keytruda, with several weeks of abdominal pain, nausea, vomiting, PO intolerance and weight loss.  Previous imaging had shown extensive stool burden throughout with mild colonic and small bowel  dilation suggestive of constipation with mild ileus.  EGD with severe esophagitis, likely from repetitive emesis. His imaging and symptoms have become more consistent with an [place] presentation, with marked colonic distention without much small bowel dilation.  His abdomen is soft, but distended and tender and bowel sounds were not appreciated.  No longer vomiting, but he has no appetite. Although he initially showed clinical improvement with Miralax and enemas, his symptoms did not improve with subsequent enema and Miralax.  He was given neostigmine yesterday evening with resultant multiple bowel movements.  Although the patient denies any significant improvement, his exam is much improved today with minimal distention, decreased tenderness and hypoactive but otherwise normal bowel sounds.  Recommend trying to advance diet back to clears.  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 endoscopic decompression  Dr. Tarri Glenn will be taking over inpatient GI responsibilities tomorrow   Daryel November, MD  05/24/2022, 9:48 AM [facility]

## 2022-05-24 NOTE — Progress Notes (Signed)
PROGRESS NOTE  Antonio Guerra  TSV:[id number] DOB: 1951/07/06 DOA: 05/19/2022 PCP: [name], MD   Brief Narrative: Patient is a 71 year old male with history of Merkel Cell carcinoma, erosive esophagitis, CKD stage IIIb, diabetes type 2, chronic systolic heart failure, coronary artery disease who presented from home with complaint of diarrhea with nausea, vomiting.  He was admitted for the same a week ago.  Work-up was done with EGD at the time which showed severe esophagitis.  Patient was discharged on Carafate and Protonix.  His nausea and vomiting worsened after he was given chemotherapy infusion as an outpatient.  Patient was admitted for further management.  Hospital course notable for persistent ileus, underwent neostigmine administration,having BM now  Assessment & Plan:  Principal Problem:   Intractable nausea and vomiting Active Problems:   Esophagitis, [place] grade D   Hyperlipidemia   Chronic systolic congestive heart failure (HCC)   Type 2 diabetes mellitus (HCC)   Stage 3a chronic kidney disease (HCC)   Merkel cell carcinoma (HCC)   Acute kidney injury superimposed on chronic kidney disease (HCC)   Ileus (HCC)   Normocytic anemia  Intractable nausea/vomiting/Ogilvie's syndrome: Was admitted for the same a week ago and was discharged on Carafate and Protonix after EGD showed severe esophagitis. Abdominal x-ray showed dilated small bowel, colon, stool burden.  GI consulted.  NG tube inserted after admission, kept n.p.o. He had a large bowel movement on 11/1, he self removed the NG tube. Currently on clear liquid diet. Last  Abdominal x-ray done showed persistent ileus, bowel sounds are very slow.  Given enema with liquidy stool on 11/3 but he complained of abdominal pain and abdomen was distended on 11/4.  Transferred to stepdown unit and given neostigmine with multiple liquid bowel movements.  Abdomen soft and nontender today.  Patient denies any abdominal pain.   Clear liquid diet started  Esophagitis: Recently diagnosed with EGD.  Continue PPI twice daily.On carafate at home  AKI on CKD stage IIIa: Baseline creatinine ranges from 1.3-1.8 .currently kidney function close to baseline   Merkel cell carcinoma: Follows with oncology, currently on chemotherapy.  Oncology aware.has a firm mass on the right groin. As per oncology, palliative care consult has been requested.  Daughter not interested on hospice approach though.  Oncology thinks that the prognosis is very poor.  Diabetes type 2: On clear liquid.  Currently on sliding scale  insulin.  Monitor blood sugars  Hyperlipidemia: Home medication on hold  Normocytic anemia: Currently hemoglobin stable  Hypokalemia: Being supplemented with potassium  Chronic systolic congestive heart failure: Currently on gentle IV fluids.  Last echo on 9/23 showed EF of 30 to 35%. On   coreg,imdur  Hypertension: Coreg and Imdur restarted.  Deconditioning/debility: We requested  a PT assessment,recommended SNF      Pressure Injury 05/12/22 Buttocks Left (Active)  05/12/22 2000  Location: Buttocks  Location Orientation: Left  Staging:   Wound Description (Comments):   Present on Admission: Yes  Dressing Type Foam - Lift dressing to assess site every shift 05/24/22 0800    DVT prophylaxis:SCDs Start: 05/19/22 1730     Code Status: Full Code  Family Communication: daughter on phone on 11/3  Patient status:Inpatient  Patient is from :Home  Anticipated discharge PQ:ZRAQ versus home  Estimated DC date:1-2 days   Consultants: GI  Procedures:None  Antimicrobials:  Anti-infectives (From admission, onward)    None       Subjective:  Patient seen and examined at bedside today.  Hemodynamically stable.  He had multiple bowel movements after given neostigmine yesterday.  He denies any abdominal pain, nausea or vomiting.  Abdomen soft, nontender, bowel sounds heard  Objective: Vitals:    05/24/22 0700 05/24/22 0800 05/24/22 0819 05/24/22 1022  BP: (!) 178/81 (!) 161/81  (!) 163/82  Pulse:  84  100  Resp: (!) 25 (!) 25  18  Temp:   97.7 F (36.5 C) 98.5 F (36.9 C)  TempSrc:   Oral Oral  SpO2:  100%  100%  Weight:      Height:        Intake/Output Summary (Last 24 hours) at 05/24/2022 1131 Last data filed at 05/23/2022 1519 Gross per 24 hour  Intake 2125.96 ml  Output --  Net 2125.96 ml   Filed Weights   05/22/22 0500 05/23/22 0500 05/24/22 0500  Weight: 78 kg 77.2 kg 77.2 kg    Examination:  General exam: Chronically ill looking, weak, deconditioned HEENT: PERRL Respiratory system:  no wheezes or crackles  Cardiovascular system: S1 & S2 heard, RRR.  Gastrointestinal system: Abdomen is soft, nontender, bowel sound heard Central nervous system: Alert and oriented Extremities: No edema, no clubbing ,no cyanosis,right groin mass Skin: No rashes, no ulcers,no icterus      Data Reviewed: I have personally reviewed following labs and imaging studies  CBC: Recent Labs  Lab 05/19/22 0915 05/19/22 1319 05/20/22 0617 05/23/22 0508  WBC 5.5 5.0 7.4 5.7  NEUTROABS 3.4 3.4  --   --   HGB 11.2* 11.5* 9.2* 8.9*  HCT 33.3* 36.1* 27.7* 27.1*  MCV 82.8 87.0 84.7 83.6  PLT 354 320 234 761   Basic Metabolic Panel: Recent Labs  Lab 05/19/22 1319 05/20/22 0617 05/21/22 0355 05/23/22 0413 05/24/22 0258  NA 139 139 141 140 137  K 4.2 3.8 3.7 3.3* 3.2*  CL 105 110 110 113* 110  CO2 '24 23 22 '$ 20* 20*  GLUCOSE 203* 153* 93 153* 112*  BUN 27* 27* 19 8 6*  CREATININE 2.03* 1.61* 1.21 1.06 1.00  CALCIUM 9.1 8.1* 8.2* 8.2* 7.9*  MG 1.9  --   --   --   --      Recent Results (from the past 240 hour(s))  MRSA Next Gen by PCR, Nasal     Status: Abnormal   Collection Time: 05/23/22  5:49 PM   Specimen: Nasal Mucosa; Nasal Swab  Result Value Ref Range Status   MRSA by PCR Next Gen DETECTED (A) NOT DETECTED Final    Comment: CRITICAL RESULT CALLED TO, READ BACK  BY AND VERIFIED WITH: Daneil Dolin, RN AT 2203 MH (NOTE) The GeneXpert MRSA Assay (FDA approved for NASAL specimens only), is one component of a comprehensive MRSA colonization surveillance program. It is not intended to diagnose MRSA infection nor to guide or monitor treatment for MRSA infections. Test performance is not FDA approved in patients less than 62 years old. Performed at [facility], [place] [address] [place], [place] [address]      Radiology Studies: No results found.  Scheduled Meds:  carvedilol  12.5 mg Oral BID WC   Chlorhexidine Gluconate Cloth  6 each Topical Daily   insulin aspart  0-5 Units Subcutaneous QHS   insulin aspart  0-6 Units Subcutaneous TID WC   isosorbide mononitrate  10 mg Oral Daily   pantoprazole (PROTONIX) IV  40 mg Intravenous Q12H   polyethylene glycol  17 g Oral BID   sucralfate  1 g Oral  TID WC & HS   Continuous Infusions:  sodium chloride 75 mL/hr at 05/24/22 1035     LOS: 5 days   Shelly Coss, MD [facility] P11/11/2021, 11:31 AM

## 2022-05-24 NOTE — Progress Notes (Signed)
   Palliative Medicine Inpatient Follow Up Note     Chart Reviewed. Patient assessed at the bedside. No family present. Antonio Guerra is resting but easily aroused.   He is s/p neostigmine per GI with multiple liquid bowel movements. NG was inserted on admission however patient self removed with no reinsertion. On PPI twice daily. Continues to complain of some abdominal pain and feeling "weak".   Daughter has been updated. Patient and family's goals remain set to continue to treat aggressively. They are not interesting in discussing hospice or comfort focused care.    Discussed the importance of continued conversation with family and their  medical providers regarding overall plan of care and treatment options, ensuring decisions are within the context of the patients values and GOCs.   Questions addressed and support provided.    Objective Assessment: Vital Signs Vitals:   05/24/22 0819 05/24/22 1022  BP:  (!) 163/82  Pulse:  100  Resp:  18  Temp: 97.7 F (36.5 C) 98.5 F (36.9 C)  SpO2:  100%    Intake/Output Summary (Last 24 hours) at 05/24/2022 1125 Last data filed at 05/23/2022 1519 Gross per 24 hour  Intake 2125.96 ml  Output --  Net 2125.96 ml   Last Weight  Most recent update: 05/24/2022  5:17 AM    Weight  77.2 kg (170 lb 3.1 oz)            Gen:  weak, chronically-ill appearing  HEENT: moist mucous membranes CV: Regular rate and rhythm, no murmurs rubs or gallops PULM: clear to auscultation bilaterally. No wheezes/rales/rhonchi ABD: soft/nontender/nondistended/normal bowel sounds EXT: No edema Neuro: Alert and oriented x3  SUMMARY OF RECOMMENDATIONS   Continue with current plan of care per medical team  Goals remain clear and set for full code and full scope care.  PMT will continue to support and follow on as needed basis. Please secure chat for urgent needs. Patient will not be seen daily unless further needs arise.   Time Total: 35 min   Visit  consisted of counseling and education dealing with the complex and emotionally intense issues of symptom management and palliative care in the setting of serious and potentially life-threatening illness.Greater than 50%  of this time was spent counseling and coordinating care related to the above assessment and plan.  Alda Lea, AGPCNP-BC  Palliative Medicine Team [phone]  Palliative Medicine Team providers are available by phone from 7am to 7pm daily and can be reached through the team cell phone. Should this patient require assistance outside of these hours, please call the patient's attending physician.

## 2022-05-25 ENCOUNTER — Ambulatory Visit: Payer: Medicare (Managed Care)

## 2022-05-25 ENCOUNTER — Inpatient Hospital Stay (HOSPITAL_COMMUNITY): Payer: Medicare (Managed Care)

## 2022-05-25 DIAGNOSIS — R112 Nausea with vomiting, unspecified: Secondary | ICD-10-CM | POA: Diagnosis not present

## 2022-05-25 DIAGNOSIS — K567 Ileus, unspecified: Secondary | ICD-10-CM | POA: Diagnosis not present

## 2022-05-25 LAB — GLUCOSE, CAPILLARY
Glucose-Capillary: 122 mg/dL — ABNORMAL HIGH (ref 70–99)
Glucose-Capillary: 127 mg/dL — ABNORMAL HIGH (ref 70–99)
Glucose-Capillary: 160 mg/dL — ABNORMAL HIGH (ref 70–99)
Glucose-Capillary: 179 mg/dL — ABNORMAL HIGH (ref 70–99)

## 2022-05-25 LAB — BASIC METABOLIC PANEL
Anion gap: 1 — ABNORMAL LOW (ref 5–15)
BUN: 5 mg/dL — ABNORMAL LOW (ref 8–23)
CO2: 21 mmol/L — ABNORMAL LOW (ref 22–32)
Calcium: 8.1 mg/dL — ABNORMAL LOW (ref 8.9–10.3)
Chloride: 114 mmol/L — ABNORMAL HIGH (ref 98–111)
Creatinine, Ser: 0.9 mg/dL (ref 0.61–1.24)
GFR, Estimated: >60 mL/min (ref >60)
Glucose, Bld: 139 mg/dL — ABNORMAL HIGH (ref 70–99)
Potassium: 3.6 mmol/L (ref 3.5–5.1)
Sodium: 136 mmol/L (ref 135–145)

## 2022-05-25 MED ORDER — MUPIROCIN 2 % EX OINT
1.0000 | TOPICAL_OINTMENT | Freq: Two times a day (BID) | CUTANEOUS | Status: AC
  Administered 2022-05-25 – 2022-05-30 (×10): 1 via NASAL
  Filled 2022-05-25: qty 22

## 2022-05-25 MED ORDER — PANTOPRAZOLE SODIUM 40 MG PO TBEC
40.0000 mg | DELAYED_RELEASE_TABLET | Freq: Two times a day (BID) | ORAL | Status: DC
  Administered 2022-05-25 – 2022-06-05 (×22): 40 mg via ORAL
  Filled 2022-05-25 (×22): qty 1

## 2022-05-25 NOTE — Progress Notes (Signed)
[facility] Progress Note  CC: Nausea, vomiting and abdominal pain  Subjective: He is sitting up in the chair, passing gas per the rectum.  He passed a few loose stools earlier this morning.  He stated his central upper abdominal pain is about the same as it was yesterday, no worse.  He is tolerating clear liquid diet.  No nausea or vomiting.  No family at the bedside.   Objective:  Vital signs in last 24 hours: Temp:  [97.6 F (36.4 C)-98.6 F (37 C)] 97.6 F (36.4 C) (11/06 1241) Pulse Rate:  [94-110] 110 (11/06 1241) Resp:  [16-18] 18 (11/06 1241) BP: (131-166)/(84-89) 131/88 (11/06 1241) SpO2:  [98 %-100 %] 100 % (11/06 1241) Weight:  [80.9 kg] 80.9 kg (11/06 0500) Last BM Date : 05/24/22 General: Alert fatigued appearing 71 year old male in no acute distress. Heart: Regular rate and rhythm, no murmurs. Pulm: Sounds clear, decreased in the bases. Abdomen: Soft, mild distention.  Positive bowel sounds to all 4 quadrants.  Mild generalized tenderness without rebound or guarding. Extremities:  Without edema. Neurologic:  Alert and  oriented x 3.  Speech is clear.  Moves all extremities. Psych:  Alert and cooperative. Normal mood and affect.  Intake/Output from previous day: 11/05 0701 - 11/06 0700 In: 900 [P.O.:900] Out: 950 [Urine:950] Intake/Output this shift: Total I/O In: -  Out: 600 [Urine:600]  Lab Results: Recent Labs    05/23/22 0508  WBC 5.7  HGB 8.9*  HCT 27.1*  PLT 253   BMET Recent Labs    05/23/22 0413 05/24/22 0258 05/25/22 0405  NA 140 137 136  K 3.3* 3.2* 3.6  CL 113* 110 114*  CO2 20* 20* 21*  GLUCOSE 153* 112* 139*  BUN 8 6* 5*  CREATININE 1.06 1.00 0.90  CALCIUM 8.2* 7.9* 8.1*   LFT No results for input(s): "PROT", "ALBUMIN", "AST", "ALT", "ALKPHOS", "BILITOT", "BILIDIR", "IBILI" in the last 72 hours. PT/INR No results for input(s): "LABPROT", "INR" in the last 72 hours. Hepatitis Panel No results for input(s):  "HEPBSAG", "HCVAB", "HEPAIGM", "HEPBIGM" in the last 72 hours.  DG Abd 1 View  Result Date: 05/24/2022 CLINICAL DATA:  Abdominal distension with nausea. EXAM: ABDOMEN - 1 VIEW COMPARISON:  05/22/2022 and prior radiographs FINDINGS: Gaseous distension of the colon has decreased. Gas in nondistended small bowel loops are present. No suspicious calcifications are present. No acute bony abnormalities are noted. IMPRESSION: Decreased colonic distension.  No other significant abnormalities. Electronically Signed   By: Margarette Canada M.D.   On: 05/24/2022 13:26    Assessment / Plan:  71 year old male with Merkel cell carcinoma treated with Keytruda admitted to the hospital 05/19/2022 with abdominal nausea/vomiting, abdominal pain and weight loss. Previous imaging had shown extensive stool burden throughout with mild colonic and small bowel dilation suggestive of constipation with mild ileus.  EGD done during his prior admission 05/13/2022 showed severe esophagitis, likely from repetitive emesis. His imaging and symptoms thought to be more consistent with an Ogilvie's syndrome.  Failed conservative therapy with laxatives/enemas.  He was transferred to the ICU for Neostigmine administration 11/4 which resulted in passing multiple bowel movements.  He was transferred back to the medical floor.  Today K+ 3.6.  Tolerating a clear liquid diet.  Dynamically stable. -Advance to full liquid diet  -Avoid/limit narcotics and anticholinergic drugs - Maintenance IVF to maintain hydration - Keep K+ > 4 < 5 - Continue to encourage mobility, out of bed activity, rotating patient on  left and right sides  Anemia. Hg 11.5 on 05/19/2022 -> Hg 9.2 -> Hg 8.9 on 11/4. No overt GI bleeding.  -CBC in am  -Transfuse for hemoglobin < 7  Esophagitis per EGD 05/13/2022 -Continue PPI '40mg'$  IV twice daily  Await further recommendations per Dr. Tarri Glenn    Principal Problem:   Intractable nausea and vomiting Active Problems:    Hyperlipidemia   Chronic systolic congestive heart failure (HCC)   Type 2 diabetes mellitus (HCC)   Stage 3a chronic kidney disease (HCC)   Merkel cell carcinoma (HCC)   Acute kidney injury superimposed on chronic kidney disease (HCC)   Esophagitis, [place] grade D   Ileus (HCC)   Normocytic anemia     LOS: 6 days   Antonio Guerra  05/25/2022, 1:47 PM

## 2022-05-25 NOTE — Progress Notes (Signed)
Physical Therapy Treatment Patient Details Name: Antonio Guerra MRN: [id number] DOB: 08/14/1950 Today's Date: 05/25/2022   History of Present Illness 71 year old male with history of Merkel Cell carcinoma, erosive esophagitis, CKD stage IIIb, diabetes type 2, chronic systolic heart failure, coronary artery disease who presented from home with complaint of diarrhea with nausea, vomiting.  Recent admission last week for same symptoms.  Pt was having and still has pain in Right groin/leg (enlarged lymph node last admission, denies any falls).    PT Comments    Pt assisted OOB to recliner today.  Pt requiring min-mod assist for bed mobility and transfer.  Pt felt unable to ambulate today.  Continue to recommend SNF Upon d/c.    Recommendations for follow up therapy are one component of a multi-disciplinary discharge planning process, led by the attending physician.  Recommendations may be updated based on patient status, additional functional criteria and insurance authorization.  Follow Up Recommendations  Skilled nursing-short term rehab (<3 hours/day) Can patient physically be transported by private vehicle: Yes   Assistance Recommended at Discharge Frequent or constant Supervision/Assistance  Patient can return home with the following Assistance with cooking/housework;Assist for transportation;Help with stairs or ramp for entrance;A little help with bathing/dressing/bathroom;A little help with walking and/or transfers   Equipment Recommendations  None recommended by PT    Recommendations for Other Services       Precautions / Restrictions Precautions Precautions: Fall     Mobility  Bed Mobility Overal bed mobility: Needs Assistance Bed Mobility: Supine to Sit     Supine to sit: Mod assist     General bed mobility comments: increased time and effort; assist for trunk upright    Transfers Overall transfer level: Needs assistance Equipment used: Rolling walker (2  wheels) Transfers: Sit to/from Stand, Bed to chair/wheelchair/BSC Sit to Stand: Min assist, +2 safety/equipment   Step pivot transfers: Min assist, +2 safety/equipment       General transfer comment: verbal cues for hand placement, weight shifting; assist to rise, steady and control descent    Ambulation/Gait               General Gait Details: pt felt unable today   Stairs             Wheelchair Mobility    Modified Rankin (Stroke Patients Only)       Balance Overall balance assessment: Needs assistance Sitting-balance support: Feet supported Sitting balance-Leahy Scale: Fair     Standing balance support: During functional activity, Reliant on assistive device for balance, Bilateral upper extremity supported Standing balance-Leahy Scale: Poor                              Cognition Arousal/Alertness: Awake/alert Behavior During Therapy: Flat affect Overall Cognitive Status: No family/caregiver present to determine baseline cognitive functioning                                 General Comments: Pt pleasant, able to follow commands, minimally conversational        Exercises      General Comments        Pertinent Vitals/Pain Pain Assessment Pain Assessment: Faces Faces Pain Scale: Hurts little more Pain Location: back Pain Descriptors / Indicators: Sore, Grimacing Pain Intervention(s): Monitored during session    Home Living  Prior Function            PT Goals (current goals can now be found in the care plan section) Progress towards PT goals: Progressing toward goals    Frequency    Min 2X/week      PT Plan Current plan remains appropriate    Co-evaluation              AM-PAC PT "6 Clicks" Mobility   Outcome Measure  Help needed turning from your back to your side while in a flat bed without using bedrails?: A Little Help needed moving from lying on your  back to sitting on the side of a flat bed without using bedrails?: A Little Help needed moving to and from a bed to a chair (including a wheelchair)?: A Lot Help needed standing up from a chair using your arms (e.g., wheelchair or bedside chair)?: A Lot Help needed to walk in hospital room?: A Lot Help needed climbing 3-5 steps with a railing? : Total 6 Click Score: 13    End of Session Equipment Utilized During Treatment: Gait belt Activity Tolerance: Patient tolerated treatment well Patient left: in chair;with call bell/phone within reach;with chair alarm set;with nursing/sitter in room   PT Visit Diagnosis: Other abnormalities of gait and mobility (R26.89);Muscle weakness (generalized) (M62.81)     Time: 2924-4628 PT Time Calculation (min) (ACUTE ONLY): 12 min  Charges:  $Therapeutic Activity: 8-22 mins                     Jannette Spanner PT, DPT Physical Therapist Acute Rehabilitation Services Preferred contact method: Secure Chat Weekend Pager Only: [phone] Office: [place] 05/25/2022, 3:34 PM

## 2022-05-25 NOTE — NC FL2 (Signed)
[place] LEVEL OF CARE SCREENING TOOL     IDENTIFICATION  Patient Name: Antonio Guerra Birthdate: 03/11/1951 Sex: male Admission Date (Current Location): 05/19/2022  [facility] and [place] Number:  Herbalist and Address:  [facility],  [place] [address] [place]      Provider Number: [id number]  Attending Physician Name and Address:  Shelly Coss, MD  Relative Name and Phone Number:       Current Level of Care: Hospital Recommended Level of Care: [place] Prior Approval Number:    Date Approved/Denied:   PASRR Number: [id number]  Discharge Plan: SNF    Current Diagnoses: Patient Active Problem List   Diagnosis Date Noted   Intractable nausea and vomiting 05/19/2022   Ileus ([place]) 05/19/2022   Normocytic anemia 05/19/2022   Esophagitis, [place] grade D 05/14/2022   Acute kidney injury superimposed on chronic kidney disease (HCC)    Nausea and vomiting    Dehydration    Acute esophagitis    Gastritis without bleeding    Abnormal CT scan, colon    AKI (acute kidney injury) ([place]) 05/11/2022   Merkel cell carcinoma ([place]) 02/27/2022   Merkel cell carcinoma nodal presentation ([place]) 02/16/2022   Inguinal lymphadenopathy 01/26/2022   Stage 3a chronic kidney disease ([place]) 11/08/2019   History of angina 11/08/2019   Intermittent diarrhea 11/08/2019   Type 2 diabetes mellitus with vascular disease ([place]) 12/19/2015   Alcohol intoxication ([place]) 12/19/2015   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   Epigastric pain    Elevated troponin    Type 2 diabetes mellitus ([place]) 09/29/2015   Acute on chronic combined systolic (congestive) and diastolic (congestive) heart failure ([place]) 09/27/2015   Ischemic cardiomyopathy    Hyperlipidemia    Coronary artery disease    Chronic systolic congestive heart failure (HCC)    Stroke (HCC)     Orientation RESPIRATION BLADDER Height & Weight     Self, Time,  Situation, Place  Normal Continent Weight: 80.9 kg Height:  '6\' 1"'$  (185.4 cm)  BEHAVIORAL SYMPTOMS/MOOD NEUROLOGICAL BOWEL NUTRITION STATUS      Continent Diet (regular)  AMBULATORY STATUS COMMUNICATION OF NEEDS Skin   Extensive Assist Verbally Normal                       Personal Care Assistance Level of Assistance  Bathing, Feeding, Dressing Bathing Assistance: Limited assistance Feeding assistance: Limited assistance Dressing Assistance: Limited assistance     Functional Limitations Info  Sight, Hearing, Speech Sight Info: Adequate Hearing Info: Adequate Speech Info: Adequate    SPECIAL CARE FACTORS FREQUENCY  PT (By licensed PT), OT (By licensed OT)     PT Frequency: 5 x weekly OT Frequency: 5 x weekly            Contractures Contractures Info: Not present    Additional Factors Info  Code Status Code Status Info: full             Current Medications (05/25/2022):  This is the current hospital active medication list Current Facility-Administered Medications  Medication Dose Route Frequency Provider Last Rate Last Admin   acetaminophen (TYLENOL) tablet 650 mg  650 mg Oral Q6H PRN Shelly Coss, MD   650 mg at 05/25/22 0000   carvedilol (COREG) tablet 12.5 mg  12.5 mg Oral BID WC Shelly Coss, MD   12.5 mg at 05/25/22 0927   Chlorhexidine Gluconate Cloth 2 % PADS 6 each  6  each Topical Daily Shelly Coss, MD   6 each at 05/25/22 0927   guaiFENesin-dextromethorphan (ROBITUSSIN DM) 100-10 MG/5ML syrup 5 mL  5 mL Oral Q4H PRN Shelly Coss, MD   5 mL at 05/25/22 0550   insulin aspart (novoLOG) injection 0-5 Units  0-5 Units Subcutaneous QHS Kyle, Tyrone A, DO       insulin aspart (novoLOG) injection 0-6 Units  0-6 Units Subcutaneous TID WC Kyle, Tyrone A, DO   1 Units at 05/22/22 1323   isosorbide mononitrate (ISMO) tablet 10 mg  10 mg Oral Daily Shelly Coss, MD   10 mg at 05/25/22 0926   lidocaine (LIDODERM) 5 % 1 patch  1 patch Transdermal Q24H  Shelly Coss, MD   1 patch at 05/24/22 1618   LORazepam (ATIVAN) injection 1 mg  1 mg Intravenous Q6H PRN Marylyn Ishihara, Tyrone A, DO   1 mg at 05/23/22 2331   Oral care mouth rinse  15 mL Mouth Rinse PRN Shelly Coss, MD       oxyCODONE (Oxy IR/ROXICODONE) immediate release tablet 5 mg  5 mg Oral Q6H PRN Shelly Coss, MD   5 mg at 05/25/22 0550   pantoprazole (PROTONIX) injection 40 mg  40 mg Intravenous Q12H Kyle, Tyrone A, DO   40 mg at 05/25/22 0926   polyethylene glycol (MIRALAX / GLYCOLAX) packet 17 g  17 g Oral BID Shelly Coss, MD   17 g at 05/24/22 1035   prochlorperazine (COMPAZINE) injection 10 mg  10 mg Intravenous Q6H PRN Marylyn Ishihara, Tyrone A, DO   10 mg at 05/23/22 1836   sucralfate (CARAFATE) 1 GM/10ML suspension 1 g  1 g Oral TID WC & HS Zehr, Jessica D, PA-C   1 g at 05/25/22 1351     Discharge Medications: Please see discharge summary for a list of discharge medications.  Relevant Imaging Results:  Relevant Lab Results:   Additional Information [id number]  Leeroy Cha, RN

## 2022-05-25 NOTE — Progress Notes (Signed)
PROGRESS NOTE  Antonio Guerra  FWY:[id number] DOB: 1950/12/10 DOA: 05/19/2022 PCP: [name], MD   Brief Narrative: Patient is a 71 year old male with history of Merkel Cell carcinoma, erosive esophagitis, CKD stage IIIb, diabetes type 2, chronic systolic heart failure, coronary artery disease who presented from home with complaint of diarrhea with nausea, vomiting.  He was admitted for the same a week ago.  Work-up was done with EGD at the time which showed severe esophagitis.  Patient was discharged on Carafate and Protonix.  His nausea and vomiting worsened after he was given chemotherapy infusion as an outpatient.  Patient was admitted for further management.  Hospital course notable for persistent ileus, underwent neostigmine administration,having BM now.PT/OT recommending SNF  Assessment & Plan:  Principal Problem:   Intractable nausea and vomiting Active Problems:   Esophagitis, [place] grade D   Hyperlipidemia   Chronic systolic congestive heart failure (HCC)   Type 2 diabetes mellitus (HCC)   Stage 3a chronic kidney disease (HCC)   Merkel cell carcinoma (HCC)   Acute kidney injury superimposed on chronic kidney disease (HCC)   Ileus (HCC)   Normocytic anemia  Intractable nausea/vomiting/Ogilvie's syndrome: Was admitted for the same a week ago and was discharged on Carafate and Protonix after EGD showed severe esophagitis. Abdominal x-ray showed dilated small bowel, colon, stool burden.  GI consulted.  NG tube inserted after admission, kept n.p.o. He had a large bowel movement on 11/1, he self removed the NG tube. Currently on clear liquid diet. Last  Abdominal x-ray done showed persistent ileus, bowel sounds are very slow.  Given enema with liquidy stool on 11/3 but he complained of abdominal pain and abdomen was distended on 11/4.  Transferred to stepdown unit and given neostigmine with multiple liquid bowel movements.   Liquid stool now.  Diet advanced to full  liquid.  Esophagitis: Recently diagnosed with EGD.  Continue PPI twice daily.On carafate at home  AKI on CKD stage IIIa: Baseline creatinine ranges from 1.3-1.8 .currently kidney function close to baseline   Merkel cell carcinoma: Follows with oncology, currently on chemotherapy.  Oncology aware.has a firm mass on the right groin. As per oncology, palliative care consult has been requested.  Daughter not interested on hospice approach though.  Oncology thinks that the prognosis is very poor.  Diabetes type 2: On clear liquid.  Currently on sliding scale  insulin.  Monitor blood sugars  Hyperlipidemia: Home medication on hold  Normocytic anemia: Currently hemoglobin stable  Hypokalemia: Supplemented with potassium  Chronic systolic congestive heart failure: Currently on gentle IV fluids.  Last echo on 9/23 showed EF of 30 to 35%. On   coreg,imdur  Hypertension: Coreg and Imdur restarted.  Deconditioning/debility: We requested  a PT assessment,recommended SNF.  Daughter already working with outpatient social worker for transfer to [place] [facility] 05/12/22 Buttocks Left (Active)  05/12/22 2000  Location: Buttocks  Location Orientation: Left  Staging:   Wound Description (Comments):   Present on Admission: Yes  Dressing Type Foam - Lift dressing to assess site every shift 05/24/22 2000    DVT prophylaxis:SCDs Start: 05/19/22 1730     Code Status: Full Code  Family Communication: daughter on phone on 11/6  Patient status:Inpatient  Patient is from :Home  Anticipated discharge to:SNF  Estimated DC date:1-2 days   Consultants: GI  Procedures:None  Antimicrobials:  Anti-infectives (From admission, onward)    None       Subjective:  Patient  seen and examined at bedside today.  Hemodynamically stable.  Lying in bed.  Having loose bowel movements, rectal tube was inserted.  Objective: Vitals:   05/25/22 0614 05/25/22 0800 05/25/22 0927  05/25/22 1241  BP: (!) 155/87  (!) 162/89 131/88  Pulse: 94  99 (!) 110  Resp: '16 17  18  '$ Temp: 98.1 F (36.7 C)   97.6 F (36.4 C)  TempSrc: Oral   Oral  SpO2: 100%   100%  Weight:      Height:        Intake/Output Summary (Last 24 hours) at 05/25/2022 1352 Last data filed at 05/25/2022 3536 Gross per 24 hour  Intake 300 ml  Output 1550 ml  Net -1250 ml   Filed Weights   05/23/22 0500 05/24/22 0500 05/25/22 0500  Weight: 77.2 kg 77.2 kg 80.9 kg    Examination:  General exam: Chronically ill looking, weak, deconditioned HEENT: PERRL Respiratory system:  no wheezes or crackles  Cardiovascular system: S1 & S2 heard, RRR.  Gastrointestinal system: Abdomen is soft, nontender, bowel sound heard Central nervous system: Alert and oriented Extremities: No edema, no clubbing ,no cyanosis,right groin mass Skin: No rashes, no ulcers,no icterus      Data Reviewed: I have personally reviewed following labs and imaging studies  CBC: Recent Labs  Lab 05/19/22 0915 05/19/22 1319 05/20/22 0617 05/23/22 0508  WBC 5.5 5.0 7.4 5.7  NEUTROABS 3.4 3.4  --   --   HGB 11.2* 11.5* 9.2* 8.9*  HCT 33.3* 36.1* 27.7* 27.1*  MCV 82.8 87.0 84.7 83.6  PLT 354 320 234 144   Basic Metabolic Panel: Recent Labs  Lab 05/19/22 1319 05/20/22 0617 05/21/22 0355 05/23/22 0413 05/24/22 0258 05/25/22 0405  NA 139 139 141 140 137 136  K 4.2 3.8 3.7 3.3* 3.2* 3.6  CL 105 110 110 113* 110 114*  CO2 '24 23 22 '$ 20* 20* 21*  GLUCOSE 203* 153* 93 153* 112* 139*  BUN 27* 27* 19 8 6* 5*  CREATININE 2.03* 1.61* 1.21 1.06 1.00 0.90  CALCIUM 9.1 8.1* 8.2* 8.2* 7.9* 8.1*  MG 1.9  --   --   --   --   --      Recent Results (from the past 240 hour(s))  MRSA Next Gen by PCR, Nasal     Status: Abnormal   Collection Time: 05/23/22  5:49 PM   Specimen: Nasal Mucosa; Nasal Swab  Result Value Ref Range Status   MRSA by PCR Next Gen DETECTED (A) NOT DETECTED Final    Comment: CRITICAL RESULT CALLED TO,  READ BACK BY AND VERIFIED WITH: Daneil Dolin, RN AT 2203 MH (NOTE) The GeneXpert MRSA Assay (FDA approved for NASAL specimens only), is one component of a comprehensive MRSA colonization surveillance program. It is not intended to diagnose MRSA infection nor to guide or monitor treatment for MRSA infections. Test performance is not FDA approved in patients less than 54 years old. Performed at [facility], [place] [address], [place], [place] [address]      Radiology Studies: DG Abd 1 View  Result Date: 05/24/2022 CLINICAL DATA:  Abdominal distension with nausea. EXAM: ABDOMEN - 1 VIEW COMPARISON:  05/22/2022 and prior radiographs FINDINGS: Gaseous distension of the colon has decreased. Gas in nondistended small bowel loops are present. No suspicious calcifications are present. No acute bony abnormalities are noted. IMPRESSION: Decreased colonic distension.  No other significant abnormalities. Electronically Signed   By: Cleatis Polka.D.  On: 05/24/2022 13:26    Scheduled Meds:  carvedilol  12.5 mg Oral BID WC   Chlorhexidine Gluconate Cloth  6 each Topical Daily   insulin aspart  0-5 Units Subcutaneous QHS   insulin aspart  0-6 Units Subcutaneous TID WC   isosorbide mononitrate  10 mg Oral Daily   lidocaine  1 patch Transdermal Q24H   pantoprazole (PROTONIX) IV  40 mg Intravenous Q12H   polyethylene glycol  17 g Oral BID   sucralfate  1 g Oral TID WC & HS   Continuous Infusions:  sodium chloride 75 mL/hr at 05/24/22 2353     LOS: 6 days   Shelly Coss, MD [facility] P11/12/2021, 1:52 PM

## 2022-05-25 NOTE — Progress Notes (Signed)

## 2022-05-25 NOTE — Plan of Care (Signed)
  Problem: Clinical Measurements: Goal: Diagnostic test results will improve Outcome: Progressing Goal: Respiratory complications will improve Outcome: Progressing Goal: Cardiovascular complication will be avoided Outcome: Progressing   Problem: Nutrition: Goal: Adequate nutrition will be maintained Outcome: Progressing   

## 2022-05-25 NOTE — TOC Progression Note (Signed)
Transition of Care [facility]) - Progression Note    Patient Details  Name: Antonio Guerra MRN: [id number] Date of Birth: 1951-06-10  [facility] CM/SW Contact  Leeroy Cha, RN Phone Number: 05/25/2022, 2:07 PM  Clinical Narrative:    Pt 's wife wants [facility].  Fl2 sent to [facility] for review.   Expected Discharge Plan: Home/Self Care Barriers to Discharge: Continued Medical Work up  Expected Discharge Plan and Services Expected Discharge Plan: Home/Self Care   Discharge Planning Services: CM Consult   Living arrangements for the past 2 months: Apartment                                       Social Determinants of Health (SDOH) Interventions    Readmission Risk Interventions   Row Labels 05/20/2022    9:42 AM  Readmission Risk Prevention Plan   Section Header. No data exists in this row.   Transportation Screening   Complete  Medication Review [employer])   Complete  PCP or Specialist appointment within 3-5 days of discharge   Complete  HRI or [place]   Complete  SW Recovery Care/Counseling Consult   Complete  [place]   Not Applicable

## 2022-05-26 ENCOUNTER — Inpatient Hospital Stay (HOSPITAL_COMMUNITY): Payer: Medicare (Managed Care)

## 2022-05-26 ENCOUNTER — Ambulatory Visit: Payer: Medicare (Managed Care)

## 2022-05-26 DIAGNOSIS — K567 Ileus, unspecified: Secondary | ICD-10-CM | POA: Diagnosis not present

## 2022-05-26 DIAGNOSIS — R112 Nausea with vomiting, unspecified: Secondary | ICD-10-CM | POA: Diagnosis not present

## 2022-05-26 LAB — CBC
HCT: 26.8 % — ABNORMAL LOW (ref 39.0–52.0)
Hemoglobin: 9.1 g/dL — ABNORMAL LOW (ref 13.0–17.0)
MCH: 27.7 pg (ref 26.0–34.0)
MCHC: 34 g/dL (ref 30.0–36.0)
MCV: 81.7 fL (ref 80.0–100.0)
Platelets: 200 10*3/uL (ref 150–400)
RBC: 3.28 MIL/uL — ABNORMAL LOW (ref 4.22–5.81)
RDW: 15.1 % (ref 11.5–15.5)
WBC: 4.1 10*3/uL (ref 4.0–10.5)
nRBC: 0 % (ref 0.0–0.2)

## 2022-05-26 LAB — BASIC METABOLIC PANEL
Anion gap: 3 — ABNORMAL LOW (ref 5–15)
BUN: <5 mg/dL — ABNORMAL LOW (ref 8–23)
CO2: 21 mmol/L — ABNORMAL LOW (ref 22–32)
Calcium: 8.3 mg/dL — ABNORMAL LOW (ref 8.9–10.3)
Chloride: 112 mmol/L — ABNORMAL HIGH (ref 98–111)
Creatinine, Ser: 1.01 mg/dL (ref 0.61–1.24)
GFR, Estimated: >60 mL/min (ref >60)
Glucose, Bld: 138 mg/dL — ABNORMAL HIGH (ref 70–99)
Potassium: 3.7 mmol/L (ref 3.5–5.1)
Sodium: 136 mmol/L (ref 135–145)

## 2022-05-26 LAB — IRON AND TIBC
Iron: 40 ug/dL — ABNORMAL LOW (ref 45–182)
Saturation Ratios: 19 % (ref 17.9–39.5)
TIBC: 210 ug/dL — ABNORMAL LOW (ref 250–450)
UIBC: 170 ug/dL

## 2022-05-26 LAB — MAGNESIUM: Magnesium: 1.6 mg/dL — ABNORMAL LOW (ref 1.7–2.4)

## 2022-05-26 LAB — FOLATE: Folate: 7.2 ng/mL (ref >5.9)

## 2022-05-26 LAB — FERRITIN: Ferritin: 90 ng/mL (ref 24–336)

## 2022-05-26 LAB — GLUCOSE, CAPILLARY
Glucose-Capillary: 122 mg/dL — ABNORMAL HIGH (ref 70–99)
Glucose-Capillary: 189 mg/dL — ABNORMAL HIGH (ref 70–99)
Glucose-Capillary: 146 mg/dL — ABNORMAL HIGH (ref 70–99)
Glucose-Capillary: 142 mg/dL — ABNORMAL HIGH (ref 70–99)

## 2022-05-26 LAB — VITAMIN B12: Vitamin B-12: 819 pg/mL (ref 180–914)

## 2022-05-26 MED ORDER — MAGNESIUM SULFATE 2 GM/50ML IV SOLN
2.0000 g | Freq: Once | INTRAVENOUS | Status: AC
  Administered 2022-05-26: 2 g via INTRAVENOUS
  Filled 2022-05-26: qty 50

## 2022-05-26 MED ORDER — FERROUS SULFATE 325 (65 FE) MG PO TABS
325.0000 mg | ORAL_TABLET | Freq: Every day | ORAL | Status: DC
  Administered 2022-05-26 – 2022-05-28 (×3): 325 mg via ORAL
  Filled 2022-05-26 (×3): qty 1

## 2022-05-26 MED ORDER — IOHEXOL 9 MG/ML PO SOLN
ORAL | Status: AC
  Administered 2022-05-26: 500 mL
  Filled 2022-05-26: qty 1000

## 2022-05-26 MED ORDER — IOHEXOL 300 MG/ML  SOLN
100.0000 mL | Freq: Once | INTRAMUSCULAR | Status: AC | PRN
  Administered 2022-05-26: 100 mL via INTRAVENOUS

## 2022-05-26 MED ORDER — BISACODYL 10 MG RE SUPP
10.0000 mg | Freq: Two times a day (BID) | RECTAL | Status: AC
  Administered 2022-05-26 – 2022-05-27 (×4): 10 mg via RECTAL
  Filled 2022-05-26 (×5): qty 1

## 2022-05-26 MED ORDER — IOHEXOL 9 MG/ML PO SOLN
500.0000 mL | ORAL | Status: AC
  Administered 2022-05-26 (×2): 500 mL via ORAL

## 2022-05-26 MED ORDER — SODIUM CHLORIDE (PF) 0.9 % IJ SOLN
INTRAMUSCULAR | Status: AC
  Filled 2022-05-26: qty 50

## 2022-05-26 NOTE — Plan of Care (Signed)
  Problem: Clinical Measurements: Goal: Respiratory complications will improve Outcome: Progressing   Problem: Activity: Goal: Risk for activity intolerance will decrease Outcome: Progressing   Problem: Nutrition: Goal: Adequate nutrition will be maintained Outcome: Progressing   

## 2022-05-26 NOTE — Progress Notes (Signed)
Patient with no BM on 7 a-7 p shift, even after Dulcolax suppository.  Patient up to chair for several hours, up with 2 assist, sat on BSC attempting to have BM, not successful.  Patient does not complain of nausea, vomiting or abdominal pain during shift.

## 2022-05-26 NOTE — Progress Notes (Signed)
PROGRESS NOTE  Antonio Guerra  HTD:[id number] DOB: 24-Feb-1951 DOA: 05/19/2022 PCP: [name], MD   Brief Narrative: Patient is a 71 year old male with history of Merkel Cell carcinoma, erosive esophagitis, CKD stage IIIb, diabetes type 2, chronic systolic heart failure, coronary artery disease who presented from home with complaint of diarrhea with nausea, vomiting.  He was admitted for the same a week ago.  Work-up was done with EGD at the time which showed severe esophagitis.  Patient was discharged on Carafate and Protonix.  His nausea and vomiting worsened after he was given chemotherapy infusion as an outpatient.  Patient was admitted for further management.  Hospital course notable for persistent ileus.PT/OT recommending SNF when ready  Assessment & Plan:  Principal Problem:   Intractable nausea and vomiting Active Problems:   Esophagitis, [place] grade D   Hyperlipidemia   Chronic systolic congestive heart failure (HCC)   Type 2 diabetes mellitus (HCC)   Stage 3a chronic kidney disease (HCC)   Merkel cell carcinoma (HCC)   Acute kidney injury superimposed on chronic kidney disease (HCC)   Ileus (HCC)   Normocytic anemia  Intractable nausea/vomiting/Ogilvie's syndrome: Was admitted for the same a week ago before this admission and was discharged on Carafate and Protonix after EGD showed severe esophagitis. Abdominal x-ray showed dilated small bowel, colon, stool burden.  GI consulted.  NG tube inserted after admission, kept n.p.o. He had a large bowel movement on 11/1, he self removed the NG tube. Given enema with liquidy stool on 11/3 but he complained of abdominal pain and abdomen was distended on 11/4.  Transferred to stepdown unit and given neostigmine with multiple liquid bowel movements.   Abdomen is again mildly distended, he complains of abdominal pain today, abdomen x-ray showed persistent ileus.  GI closely following.  Plan for CT abdomen/pelvis.On full  liquid  Esophagitis: Recently diagnosed with EGD.  Continue PPI twice daily.On carafate at home  AKI on CKD stage IIIa: Baseline creatinine ranges from 1.3-1.8 .currently kidney function better than baseline   Merkel cell carcinoma: Follows with oncology, currently on chemotherapy.  Oncology aware.has a firm mass on the right groin. As per oncology, palliative care consult was requested.  Daughter not interested on hospice approach though.  Oncology thinks that the prognosis is very poor.  Diabetes type 2:  Currently on sliding scale  insulin.  Monitor blood sugars  Hyperlipidemia: Home medication on hold  Normocytic anemia: Currently hemoglobin stable  Hypokalemia: Being monitored and supplemented as needed  Chronic systolic congestive heart failure: Currently on gentle IV fluids.  Last echo on 9/23 showed EF of 30 to 35%. On   coreg,imdur  Hypertension: Coreg and Imdur restarted.  Deconditioning/debility: We requested  a PT assessment,recommended SNF.  Daughter already working with outpatient social worker for transfer to [facility].TOC aware      Pressure Injury 05/12/22 Buttocks Left (Active)  05/12/22 2000  Location: Buttocks  Location Orientation: Left  Staging:   Wound Description (Comments):   Present on Admission: Yes  Dressing Type Foam - Lift dressing to assess site every shift 05/26/22 0818    DVT prophylaxis:SCDs Start: 05/19/22 1730     Code Status: Full Code  Family Communication: daughter on phone on 11/6  Patient status:Inpatient  Patient is from :Home  Anticipated discharge to:SNF  Estimated DC date:after resolution of ileus   Consultants: GI  Procedures:None  Antimicrobials:  Anti-infectives (From admission, onward)    None       Subjective:  Patient seen and examined the bedside today.  Contrary to yesterday, his abdomen is again slightly distended, he is again started having abdominal pain.  No nausea or  vomiting.  Objective: Vitals:   05/25/22 1241 05/25/22 1716 05/25/22 2032 05/26/22 0651  BP: 131/88 135/85 (!) 151/85 (!) 163/84  Pulse: (!) 110 99 (!) 104 100  Resp: '18  16 16  '$ Temp: 97.6 F (36.4 C)  98.2 F (36.8 C) 98.4 F (36.9 C)  TempSrc: Oral  Oral Oral  SpO2: 100%  100% 95%  Weight:      Height:        Intake/Output Summary (Last 24 hours) at 05/26/2022 1255 Last data filed at 05/26/2022 1006 Gross per 24 hour  Intake 1060 ml  Output 1500 ml  Net -440 ml   Filed Weights   05/23/22 0500 05/24/22 0500 05/25/22 0500  Weight: 77.2 kg 77.2 kg 80.9 kg    Examination:  General exam: weak, deconditioned, chronically looking HEENT: PERRL Respiratory system:  no wheezes or crackles  Cardiovascular system: S1 & S2 heard, RRR.  Gastrointestinal system: Abdomen is mildly distended, nontender, slow bowel sounds heard Central nervous system: Alert and oriented Extremities: No edema, no clubbing ,no cyanosis, right groin mass Skin: No rashes, no ulcers,no icterus      Data Reviewed: I have personally reviewed following labs and imaging studies  CBC: Recent Labs  Lab 05/20/22 0617 05/23/22 0508 05/26/22 0458  WBC 7.4 5.7 4.1  HGB 9.2* 8.9* 9.1*  HCT 27.7* 27.1* 26.8*  MCV 84.7 83.6 81.7  PLT 234 253 157   Basic Metabolic Panel: Recent Labs  Lab 05/21/22 0355 05/23/22 0413 05/24/22 0258 05/25/22 0405 05/26/22 0458  NA 141 140 137 136 136  K 3.7 3.3* 3.2* 3.6 3.7  CL 110 113* 110 114* 112*  CO2 22 20* 20* 21* 21*  GLUCOSE 93 153* 112* 139* 138*  BUN 19 8 6* 5* <5*  CREATININE 1.21 1.06 1.00 0.90 1.01  CALCIUM 8.2* 8.2* 7.9* 8.1* 8.3*  MG  --   --   --   --  1.6*     Recent Results (from the past 240 hour(s))  MRSA Next Gen by PCR, Nasal     Status: Abnormal   Collection Time: 05/23/22  5:49 PM   Specimen: Nasal Mucosa; Nasal Swab  Result Value Ref Range Status   MRSA by PCR Next Gen DETECTED (A) NOT DETECTED Final    Comment: CRITICAL RESULT  CALLED TO, READ BACK BY AND VERIFIED WITH: Daneil Dolin, RN AT 2203 MH (NOTE) The GeneXpert MRSA Assay (FDA approved for NASAL specimens only), is one component of a comprehensive MRSA colonization surveillance program. It is not intended to diagnose MRSA infection nor to guide or monitor treatment for MRSA infections. Test performance is not FDA approved in patients less than 57 years old. Performed at [facility], [place] [address], [place], [place] [address]      Radiology Studies: DG Abd 2 Views  Result Date: 05/25/2022 CLINICAL DATA:  Abdominal pain.  Emesis. EXAM: ABDOMEN - 2 VIEW COMPARISON:  Abdominal radiograph 05/24/2022 FINDINGS: No intraperitoneal free air is identified. Moderate gaseous distension of the colon is similar to the prior study. No significant small bowel dilatation is evident. Atherosclerotic vascular calcifications are noted. IMPRESSION: Unchanged moderate gaseous distension of the colon. Electronically Signed   By: Logan Bores M.D.   On: 05/25/2022 16:54    Scheduled Meds:  bisacodyl  10 mg Rectal  BID   carvedilol  12.5 mg Oral BID WC   Chlorhexidine Gluconate Cloth  6 each Topical Daily   ferrous sulfate  325 mg Oral Q breakfast   insulin aspart  0-5 Units Subcutaneous QHS   insulin aspart  0-6 Units Subcutaneous TID WC   isosorbide mononitrate  10 mg Oral Daily   lidocaine  1 patch Transdermal Q24H   mupirocin ointment  1 Application Nasal BID   pantoprazole  40 mg Oral BID   polyethylene glycol  17 g Oral BID   sucralfate  1 g Oral TID WC & HS   Continuous Infusions:     LOS: 7 days   Shelly Coss, MD [facility] P11/01/2022, 12:55 PM

## 2022-05-26 NOTE — Progress Notes (Signed)
[facility] Progress Note  CC:  Nausea, vomiting and abdominal pain   Subjective: No N/V. He stated his abdominal pain is less today. No BM over night. RN at the bed side to assist him to the commode at this time as he felt he might pass a BM but did not. He is tolerating a full liquid diet.  He received Oxycodone '5mg'$  at 8:15am.  Objective:  Vital signs in last 24 hours: Temp:  [97.6 F (36.4 C)-98.4 F (36.9 C)] 98.4 F (36.9 C) (11/07 0651) Pulse Rate:  [99-110] 100 (11/07 0651) Resp:  [16-18] 16 (11/07 0651) BP: (131-163)/(84-88) 163/84 (11/07 0651) SpO2:  [95 %-100 %] 95 % (11/07 0651) Last BM Date : 05/25/22 General: Chronically ill appearing 71 year old male in no acute distress. Heart: Regular rate and rhythm, no murmurs. Pulm: Breath sounds throughout, decreased in bases. Abdomen: Soft, distended with generalized tenderness that rebound or guarding gurgling bowel sounds to all 4 quadrants. Extremities:  No edema. Neurologic:  Alert and  oriented x 3.  Speech is softly spoken but clear. Moves all extremities weakly.  Psych:  Alert and cooperative. Normal mood and affect.  Intake/Output from previous day: 11/06 0701 - 11/07 0700 In: 1060 [P.O.:1060] Out: 1800 [Urine:1800] Intake/Output this shift: No intake/output data recorded.  Lab Results: Recent Labs    05/26/22 0458  WBC 4.1  HGB 9.1*  HCT 26.8*  PLT 200   BMET Recent Labs    05/24/22 0258 05/25/22 0405 05/26/22 0458  NA 137 136 136  K 3.2* 3.6 3.7  CL 110 114* 112*  CO2 20* 21* 21*  GLUCOSE 112* 139* 138*  BUN 6* 5* <5*  CREATININE 1.00 0.90 1.01  CALCIUM 7.9* 8.1* 8.3*   LFT No results for input(s): "PROT", "ALBUMIN", "AST", "ALT", "ALKPHOS", "BILITOT", "BILIDIR", "IBILI" in the last 72 hours. PT/INR No results for input(s): "LABPROT", "INR" in the last 72 hours. Hepatitis Panel No results for input(s): "HEPBSAG", "HCVAB", "HEPAIGM", "HEPBIGM" in the last 72 hours.  DG  Abd 2 Views  Result Date: 05/25/2022 CLINICAL DATA:  Abdominal pain.  Emesis. EXAM: ABDOMEN - 2 VIEW COMPARISON:  Abdominal radiograph 05/24/2022 FINDINGS: No intraperitoneal free air is identified. Moderate gaseous distension of the colon is similar to the prior study. No significant small bowel dilatation is evident. Atherosclerotic vascular calcifications are noted. IMPRESSION: Unchanged moderate gaseous distension of the colon. Electronically Signed   By: Logan Bores M.D.   On: 05/25/2022 16:54   DG Abd 1 View  Result Date: 05/24/2022 CLINICAL DATA:  Abdominal distension with nausea. EXAM: ABDOMEN - 1 VIEW COMPARISON:  05/22/2022 and prior radiographs FINDINGS: Gaseous distension of the colon has decreased. Gas in nondistended small bowel loops are present. No suspicious calcifications are present. No acute bony abnormalities are noted. IMPRESSION: Decreased colonic distension.  No other significant abnormalities. Electronically Signed   By: Margarette Canada M.D.   On: 05/24/2022 13:26    Assessment / Plan:  71 year old male with Merkel cell carcinoma treated with Keytruda admitted to the hospital 05/19/2022 with abdominal nausea/vomiting, abdominal pain and weight loss. Abd xray 10/31 showed small bowel dilatation, moderate stool burden with a dilated colon consistent with an ileus. EGD done during his prior admission 05/13/2022 showed severe esophagitis, likely from repetitive emesis. His imaging and symptoms thought to be more consistent with an Ogilvie's syndrome.  Failed conservative therapy with laxatives/enemas.  He was transferred to the ICU for Neostigmine administration 11/4 which resulted in passing  multiple bowel movements then transferred back to the medical floor. Abd xray yesterday evening showed unchanged moderate gaseous distention to the colon without perforation or obstruction. -Continue full liquid diet for now -Avoid/limit narcotics and anticholinergic drugs -Maintenance IVF to  maintain hydration -Keep K+ > 4 < 5 -Continue to encourage mobility, out of bed activity, rotating patient on left and right sides -Dulcolax suppository bid x 3 days -CTAP with oral and IV contrast  -Further recommendations per Dr. Tarri Glenn   Abnormal imaging of rectum:  Last admission CTAP 05/11/2022 suggested right lateral rectal wall thickening, possibly due to underdistention vrs neoplasm. Flexible sigmoidoscopy 10/25 >> difficult visualization with stool in rectum but examined portion of rectum was normal. No proctitis or mass seen. -No plans for a diagnostic colonoscopy at this time -Await CTAP as ordered above     Anemia. Hg 11.5 on 05/19/2022 -> Hg 9.2 -> Hg 8.9 -> today Hg 9.1. Iron 40.  TIBC 210.  Ferritin 90.  Vitamin B12 level 819.  No overt GI bleeding. On Ferrous Sulfate '325mg'$  po QD.  -CBC in am  -Transfuse for hemoglobin < 7  Esophagitis per EGD 05/13/2022 -Continue PPI '40mg'$  IV twice daily  Hypomagnesia. Mg level 1.6 -Magnesium 2gm IV ordered by the hospitalist   Principal Problem:   Intractable nausea and vomiting Active Problems:   Hyperlipidemia   Chronic systolic congestive heart failure (HCC)   Type 2 diabetes mellitus (HCC)   Stage 3a chronic kidney disease (HCC)   Merkel cell carcinoma (HCC)   Acute kidney injury superimposed on chronic kidney disease (HCC)   Esophagitis, [place] grade D   Ileus (HCC)   Normocytic anemia     LOS: 7 days   Patrecia Pour Kennedy-Smith  05/26/2022, 10:05 AM

## 2022-05-26 NOTE — TOC Progression Note (Signed)
Transition of Care [facility]) - Progression Note    Patient Details  Name: Elaine Middleton MRN: [id number] Date of Birth: July 30, 1950  [facility]) CM/SW Contact  Leeroy Cha, RN Phone Number: 05/26/2022, 9:59 AM  Clinical Narrative:    Patient has been accepted at [facility] for snf placement.  [employer] is the insurance.   Expected Discharge Plan: Home/Self Care Barriers to Discharge: Continued Medical Work up  Expected Discharge Plan and Services Expected Discharge Plan: Home/Self Care   Discharge Planning Services: CM Consult   Living arrangements for the past 2 months: Apartment                                       Social Determinants of Health (SDOH) Interventions    Readmission Risk Interventions   Row Labels 05/20/2022    9:42 AM  Readmission Risk Prevention Plan   Section Header. No data exists in this row.   Transportation Screening   Complete  Medication Review [employer])   Complete  PCP or Specialist appointment within 3-5 days of discharge   Complete  HRI or [place]   Complete  SW Recovery Care/Counseling Consult   Complete  [place]   Not Applicable

## 2022-05-27 ENCOUNTER — Ambulatory Visit: Payer: Medicare (Managed Care)

## 2022-05-27 ENCOUNTER — Ambulatory Visit: Payer: Medicare (Managed Care) | Admitting: Physician Assistant

## 2022-05-27 DIAGNOSIS — N1831 Chronic kidney disease, stage 3a: Secondary | ICD-10-CM

## 2022-05-27 DIAGNOSIS — R933 Abnormal findings on diagnostic imaging of other parts of digestive tract: Secondary | ICD-10-CM | POA: Diagnosis not present

## 2022-05-27 DIAGNOSIS — K208 Other esophagitis without bleeding: Secondary | ICD-10-CM

## 2022-05-27 DIAGNOSIS — K567 Ileus, unspecified: Secondary | ICD-10-CM | POA: Diagnosis not present

## 2022-05-27 DIAGNOSIS — Z794 Long term (current) use of insulin: Secondary | ICD-10-CM

## 2022-05-27 DIAGNOSIS — I5022 Chronic systolic (congestive) heart failure: Secondary | ICD-10-CM

## 2022-05-27 DIAGNOSIS — E782 Mixed hyperlipidemia: Secondary | ICD-10-CM

## 2022-05-27 DIAGNOSIS — N179 Acute kidney failure, unspecified: Secondary | ICD-10-CM | POA: Diagnosis not present

## 2022-05-27 DIAGNOSIS — C4A9 Merkel cell carcinoma, unspecified: Secondary | ICD-10-CM | POA: Diagnosis not present

## 2022-05-27 DIAGNOSIS — E1100 Type 2 diabetes mellitus with hyperosmolarity without nonketotic hyperglycemic-hyperosmolar coma (NKHHC): Secondary | ICD-10-CM

## 2022-05-27 DIAGNOSIS — N189 Chronic kidney disease, unspecified: Secondary | ICD-10-CM

## 2022-05-27 DIAGNOSIS — R112 Nausea with vomiting, unspecified: Secondary | ICD-10-CM | POA: Diagnosis not present

## 2022-05-27 LAB — BASIC METABOLIC PANEL
Anion gap: 4 — ABNORMAL LOW (ref 5–15)
BUN: <5 mg/dL — ABNORMAL LOW (ref 8–23)
CO2: 22 mmol/L (ref 22–32)
Calcium: 8.6 mg/dL — ABNORMAL LOW (ref 8.9–10.3)
Chloride: 110 mmol/L (ref 98–111)
Creatinine, Ser: 1.01 mg/dL (ref 0.61–1.24)
GFR, Estimated: >60 mL/min (ref >60)
Glucose, Bld: 129 mg/dL — ABNORMAL HIGH (ref 70–99)
Potassium: 3.3 mmol/L — ABNORMAL LOW (ref 3.5–5.1)
Sodium: 136 mmol/L (ref 135–145)

## 2022-05-27 LAB — GLUCOSE, CAPILLARY
Glucose-Capillary: 120 mg/dL — ABNORMAL HIGH (ref 70–99)
Glucose-Capillary: 150 mg/dL — ABNORMAL HIGH (ref 70–99)
Glucose-Capillary: 198 mg/dL — ABNORMAL HIGH (ref 70–99)
Glucose-Capillary: 104 mg/dL — ABNORMAL HIGH (ref 70–99)

## 2022-05-27 LAB — MAGNESIUM: Magnesium: 1.7 mg/dL (ref 1.7–2.4)

## 2022-05-27 MED ORDER — POTASSIUM CHLORIDE 10 MEQ/100ML IV SOLN
10.0000 meq | INTRAVENOUS | Status: AC
  Administered 2022-05-27 (×4): 10 meq via INTRAVENOUS
  Filled 2022-05-27 (×4): qty 100

## 2022-05-27 MED ORDER — ENSURE ENLIVE PO LIQD
237.0000 mL | Freq: Three times a day (TID) | ORAL | Status: DC
  Administered 2022-05-27 – 2022-06-05 (×20): 237 mL via ORAL

## 2022-05-27 MED ORDER — MAGNESIUM SULFATE 2 GM/50ML IV SOLN
2.0000 g | Freq: Once | INTRAVENOUS | Status: AC
  Administered 2022-05-27: 2 g via INTRAVENOUS
  Filled 2022-05-27: qty 50

## 2022-05-27 NOTE — Progress Notes (Signed)
Physical Therapy Treatment Patient Details Name: Antonio Guerra MRN: [id number] DOB: 12-13-1950 Today's Date: 05/27/2022   History of Present Illness 71 year old male with history of Merkel Cell carcinoma, erosive esophagitis, CKD stage IIIb, diabetes type 2, chronic systolic heart failure, coronary artery disease who presented from home with complaint of diarrhea with nausea, vomiting.  Recent admission last week for same symptoms.  Pt was having and still has pain in Right groin/leg (enlarged lymph node last admission, denies any falls).    PT Comments    Pt requiring cues for technique and to self assist.  Pt questioned in regards to his limitations today and he stated weakness so encouraged pt to mobilize and self assist.  Pt was able to ambulate short distance with seated rest breaks.  Continue to recommend SNF upon d/c.    Recommendations for follow up therapy are one component of a multi-disciplinary discharge planning process, led by the attending physician.  Recommendations may be updated based on patient status, additional functional criteria and insurance authorization.  Follow Up Recommendations  Skilled nursing-short term rehab (<3 hours/day) Can patient physically be transported by private vehicle: Yes   Assistance Recommended at Discharge Frequent or constant Supervision/Assistance  Patient can return home with the following Assistance with cooking/housework;Assist for transportation;Help with stairs or ramp for entrance;A little help with bathing/dressing/bathroom;A little help with walking and/or transfers   Equipment Recommendations       Recommendations for Other Services       Precautions / Restrictions Precautions Precautions: Fall     Mobility  Bed Mobility Overal bed mobility: Needs Assistance Bed Mobility: Supine to Sit     Supine to sit: Mod assist     General bed mobility comments: increased time and effort; assist for trunk upright     Transfers Overall transfer level: Needs assistance Equipment used: Rolling walker (2 wheels) Transfers: Sit to/from Stand Sit to Stand: Mod assist, +2 physical assistance           General transfer comment: verbal cues for hand placement, weight shifting; assist to rise, steady and control descent    Ambulation/Gait Ambulation/Gait assistance: Min assist Gait Distance (Feet): 24 Feet (total) Assistive device: Rolling walker (2 wheels) Gait Pattern/deviations: Decreased stride length, Step-to pattern, Narrow base of support Gait velocity: decreased     General Gait Details: required 2 seated rest breaks due to fatigue and weakness, donned pt's shoes per request, pt reports more weakness in R LE and difficulty advancing R LE observed   Stairs             Wheelchair Mobility    Modified Rankin (Stroke Patients Only)       Balance Overall balance assessment: Needs assistance Sitting-balance support: Feet supported Sitting balance-Leahy Scale: Fair     Standing balance support: Bilateral upper extremity supported, During functional activity Standing balance-Leahy Scale: Poor                              Cognition Arousal/Alertness: Awake/alert Behavior During Therapy: Flat affect Overall Cognitive Status: No family/caregiver present to determine baseline cognitive functioning                                 General Comments: Pt pleasant, able to follow commands, minimally conversational        Exercises      General Comments  Pertinent Vitals/Pain Pain Assessment Pain Assessment: No/denies pain Pain Intervention(s): Repositioned, Monitored during session    Home Living                          Prior Function            PT Goals (current goals can now be found in the care plan section) Progress towards PT goals: Progressing toward goals    Frequency    Min 2X/week      PT Plan Current  plan remains appropriate    Co-evaluation              AM-PAC PT "6 Clicks" Mobility   Outcome Measure  Help needed turning from your back to your side while in a flat bed without using bedrails?: A Little Help needed moving from lying on your back to sitting on the side of a flat bed without using bedrails?: A Little Help needed moving to and from a bed to a chair (including a wheelchair)?: A Lot Help needed standing up from a chair using your arms (e.g., wheelchair or bedside chair)?: A Lot Help needed to walk in hospital room?: A Lot Help needed climbing 3-5 steps with a railing? : Total 6 Click Score: 13    End of Session Equipment Utilized During Treatment: Gait belt Activity Tolerance: Patient tolerated treatment well Patient left: in chair;with call bell/phone within reach;with chair alarm set Nurse Communication: Mobility status PT Visit Diagnosis: Other abnormalities of gait and mobility (R26.89);Muscle weakness (generalized) (M62.81)     Time: 1540-0867 PT Time Calculation (min) (ACUTE ONLY): 22 min  Charges:  $Gait Training: 8-22 mins                     Arlyce Dice, DPT Physical Therapist Acute Rehabilitation Services Preferred contact method: Secure Chat Weekend Pager Only: [phone] Office: [place] 05/27/2022, 1:03 PM

## 2022-05-27 NOTE — Progress Notes (Signed)
[facility] Progress Note  CC:  Nausea, vomiting and abdominal pain    Subjective: He is having less abdominal pain today.  He passed a larger volume nonbloody watery bowel movement around 7 AM and a smaller volume of watery stool around 10 AM as reported by his RN.  He is tolerating a clear liquid diet.  He is hungry.  No family at the bedside.   Objective:  Vital signs in last 24 hours: Temp:  [97.4 F (36.3 C)-98.2 F (36.8 C)] 97.4 F (36.3 C) (11/08 0446) Pulse Rate:  [97-104] 104 (11/08 0446) Resp:  [16-20] 20 (11/08 0446) BP: (139-153)/(77-94) 144/94 (11/08 0446) SpO2:  [97 %-100 %] 97 % (11/08 0446) Weight:  [76.5 kg] 76.5 kg (11/08 0500) Last BM Date : 05/26/22 General: Alert 71 year old male chronically ill appearing in no acute distress. Heart: Regular rate rhythm, no murmurs. Pulm: Breath sounds clear throughout, decreased in the bases. Abdomen: Abdomen is less distended today, soft.  Mild tenderness throughout the upper abdomen without rebound or guarding.  Hypoactive bowel sounds to all 4 quadrants.  Large right groin mass. Rectal exam: No fecal impaction, no blood in the rectal vault.  RN at bedside during exam. Extremities:  Without edema. Neurologic:  Alert and  oriented x 3.  Speech is softly spoken but clear.  Moves all extremities weakly. Psych:  Alert and cooperative. Normal mood and affect.  Intake/Output from previous day: 11/07 0701 - 11/08 0700 In: 960 [P.O.:960] Out: 1100 [Urine:1100] Intake/Output this shift: Total I/O In: 120 [P.O.:120] Out: -   Lab Results: Recent Labs    05/26/22 0458  WBC 4.1  HGB 9.1*  HCT 26.8*  PLT 200   BMET Recent Labs    05/25/22 0405 05/26/22 0458 05/27/22 0455  NA 136 136 136  K 3.6 3.7 3.3*  CL 114* 112* 110  CO2 21* 21* 22  GLUCOSE 139* 138* 129*  BUN 5* <5* <5*  CREATININE 0.90 1.01 1.01  CALCIUM 8.1* 8.3* 8.6*   LFT No results for input(s): "PROT", "ALBUMIN", "AST", "ALT",  "ALKPHOS", "BILITOT", "BILIDIR", "IBILI" in the last 72 hours. PT/INR No results for input(s): "LABPROT", "INR" in the last 72 hours. Hepatitis Panel No results for input(s): "HEPBSAG", "HCVAB", "HEPAIGM", "HEPBIGM" in the last 72 hours.  CT ABDOMEN PELVIS W CONTRAST  Result Date: 05/26/2022 CLINICAL DATA:  Bowel obstruction suspected contrast EXAM: CT ABDOMEN AND PELVIS WITH CONTRAST TECHNIQUE: Multidetector CT imaging of the abdomen and pelvis was performed using the standard protocol following bolus administration of intravenous contrast. RADIATION DOSE REDUCTION: This exam was performed according to the departmental dose-optimization program which includes automated exposure control, adjustment of the mA and/or kV according to patient size and/or use of iterative reconstruction technique. CONTRAST:  130m OMNIPAQUE IOHEXOL 300 MG/ML  SOLN COMPARISON:  CT abdomen pelvis dated May 11, 2022 FINDINGS: Lower chest: Bibasilar atelectasis. Patulous distal esophagus. Small hiatal hernia. Hepatobiliary: No focal liver abnormality is seen. No gallstones, gallbladder wall thickening, or biliary dilatation. Pancreas: Unremarkable. No pancreatic ductal dilatation or surrounding inflammatory changes. Spleen: Normal in size without focal abnormality. Adrenals/Urinary Tract: Bilateral adrenal glands are unremarkable. Numerous small peripheral low-attenuation renal lesions, likely due to scarring. No hydronephrosis or nephrolithiasis. Bladder is unremarkable. Stomach/Bowel: Increased wall thickening of the rectosigmoid colon. Prominent gas and fluid-filled loops of ascending and transverse colon. Multiple dilated loops of distal small bowel with no focal transition point. Vascular/Lymphatic: Severe aortic atherosclerosis. Large right inguinal mass, unchanged when compared  with prior exam. Unchanged enlarged right common external iliac lymph nodes. Reproductive: Prostatomegaly. Other: Trace abdominal ascites.  Musculoskeletal: No acute or significant osseous findings. IMPRESSION: 1. Multiple mildly dilated loops of distal small bowel with no focal transition point and prominent loops of colon, findings are likely due to ileus. 2. Increased wall thickening of the rectosigmoid colon, likely due to colitis. 3. Trace abdominal ascites. 4. Unchanged large right inguinal mass and enlarged right common external iliac lymph nodes. 5. Patulous distal esophagus, findings can be seen in the setting of esophageal dysmotility. 6. Aortic Atherosclerosis (ICD10-I70.0). Electronically Signed   By: Yetta Glassman M.D.   On: 05/26/2022 17:17   DG Abd 2 Views  Result Date: 05/25/2022 CLINICAL DATA:  Abdominal pain.  Emesis. EXAM: ABDOMEN - 2 VIEW COMPARISON:  Abdominal radiograph 05/24/2022 FINDINGS: No intraperitoneal free air is identified. Moderate gaseous distension of the colon is similar to the prior study. No significant small bowel dilatation is evident. Atherosclerotic vascular calcifications are noted. IMPRESSION: Unchanged moderate gaseous distension of the colon. Electronically Signed   By: Logan Bores M.D.   On: 05/25/2022 16:54    Assessment / Plan:  58) 71 year old male with Merkel cell carcinoma treated with chemo and Keytruda admitted to the hospital 05/19/2022 with nausea/vomiting, abdominal pain and weight loss. Abd xray 10/31 showed small bowel dilatation, moderate stool burden with a dilated colon consistent with an ileus. EGD done during his prior admission 05/13/2022 showed severe esophagitis, likely from repetitive emesis and a flex sig showed stool in there rectum. 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  Failed conservative therapy with laxatives/enemas.  He was transferred to the ICU for Neostigmine administration 11/4 which resulted in passing multiple bowel movements then transferred back to the medical floor. No BM yesterday -> CTAP 11/7 showed multiple mildly  dilated loops of distal small bowel consistent with an ileus, increased wall thickening to the rectosigmoid colon, trace abdominal ascites and patulous distal esophagus.  He has less abdominal distention on exam today.  He passed 2 non-bloody watery stools this morning. -Full liquid diet  -If unable to tolerate full liquid diet may need to consider enteral feedings -Avoid/limit narcotics and anticholinergic drugs -Maintenance IVF to maintain hydration -Please keep K+ > 4 < 5 and Magnesium > 2 to improve bowel activity  -Continue to encourage mobility, out of bed activity, rotating patient on left and right sides -Dulcolax suppository bid x 3, day # 2 -Defer endoscopic recommendations to Dr. Tarri Glenn    2) Anemia. Hg 11.5 on 05/19/2022 -> Hg 9.2 -> Hg 8.9 -> Hg 9.1. Iron 40.  TIBC 210.  Ferritin 90.  Vitamin B12 level 819.  No overt GI bleeding. On Ferrous Sulfate '325mg'$  po QD.  -CBC in am  -Transfuse for hemoglobin < 7   3) Esophagitis per EGD 05/13/2022.  CTAP 11/7 showed a patulous distal esophagus. -Continue PPI '40mg'$  IV twice daily  4) Hypokalemia and hypomagnesia -KCl and magnesium IV ordered by the hospitalist -See further recommendations in #1   5) Merkel cell carcinoma with a right large inguinal mass with enlarged right common and external iliac lymph nodes per CT  6) Chronic systolic CHF.  LVEF 30 to 35% per echo 03/2022.  7) AKI on CKD  8) DM II      Principal Problem:   Intractable nausea and vomiting Active Problems:   Hyperlipidemia   Chronic systolic congestive heart failure (HCC)   Type 2 diabetes mellitus (  [place])   Stage 3a chronic kidney disease ([place])   Merkel cell carcinoma ([place])   Acute kidney injury superimposed on chronic kidney disease (HCC)   Esophagitis, [place] grade D   Ileus ([place])   Normocytic anemia     LOS: 8 days   Noralyn Pick  05/27/2022, 11:10AM

## 2022-05-27 NOTE — Progress Notes (Addendum)
PROGRESS NOTE  Antonio Guerra  HEN:[id number] DOB: 14-Feb-1951 DOA: 05/19/2022 PCP: [name], MD   Brief Narrative: Patient is a 71 year old male with past medical history of Merkel Cell carcinoma, erosive esophagitis, CKD stage IIIb, diabetes type 2, chronic systolic heart failure, coronary artery disease presented to hospital from home with complaints of nausea vomiting and diarrhea.  Of note, he was recently admitted hospital for the same a week ago and at that time had EGD done which showed severe esophagitis.  He was discharged home on Carafate and Protonix but despite that his nausea and vomiting worsened after he was given chemotherapy infusion.  Patient was then admitted to hospital again for further management.  At this time, patient has been having persistent ileus.  Physical therapy has recommended skilled nursing facility on discharge.   Assessment & Plan:  Principal Problem:   Intractable nausea and vomiting Active Problems:   Esophagitis, [place] grade D   Hyperlipidemia   Chronic systolic congestive heart failure (HCC)   Type 2 diabetes mellitus (HCC)   Stage 3a chronic kidney disease (HCC)   Merkel cell carcinoma (HCC)   Acute kidney injury superimposed on chronic kidney disease (HCC)   Ileus (HCC)   Normocytic anemia  Intractable nausea/vomiting/Ogilvie's syndrome:  Patient was recently admitted a week back for the same.  Was on Carafate and Protonix for severe esophagitis from previous admission.  Abdominal x-ray at this time showed dilated distal small bowel colon with stool burden.  GI was consulted and patient received NG tube n.p.o.  He self removed the NG tube on 05/20/2022 and had a large bowel movement.  He subsequently had a distended abdomen 11/ 4 and was transferred to stepdown unit and was given neostigmine with multiple liquid bowel movements.  Abdomen again is distended and x-ray shows persistent ileus.  GI closely following.  CT scan of the abdomen  pelvis done 05/26/2022 showed multiple mildly dilated loops of small bowel with no focal point suggestive of ileus with some thickening of the rectosigmoid colon.  Currently on clear liquids.  Denies any nausea vomiting abdominal pain today.  We will follow GI recommendations.  Continue IV fluids supportive care electrolyte replacement.  Encourage ambulation.  Severe esophagitis:  Recent EGD diagnosis.  Continue PPI twice daily   AKI on CKD stage IIIa: Baseline creatinine ranges from 1.3-1.8 .currently creatinine at 1.0 and at baseline.  Merkel cell carcinoma:  Patient has firm mass in the right groin.  Follows up with oncology.  Oncology had recommended palliative care.  Family not interested in hospice yet.  Oncology has an opinion of poor prognosis.    Diabetes mellitus type 2:   Continue sliding scale insulin Accu-Cheks.    Hyperlipidemia: Lipitor on hold.  Normocytic anemia: Latest hemoglobin of 9.1.  Check CBC in AM.  Hypokalemia: Mild.  We will replenish through IV in the context of ileus.  Magnesium of 1.7.  1 dose of IV magnesium sulfate as well.  Chronic systolic congestive heart failure: Last 2D echocardiogram on 9/23 showed EF of 30 to 35%.  Continue Coreg and Imdur.  Off IV fluids.  Continue intake and output charting, daily weights.  Patient is positive balance 4475 1 mL.  Hypertension:  Has been restarted on Coreg and Imdur.  Deconditioning/debility:  Physical therapy has recommended skilled nursing facility placement.    DVT prophylaxis:SCDs Start: 05/19/22 1730     Code Status: Full Code  Family Communication:  Spoke with the with daughter on phone  on 11/8 and updated her about the clinical condition of the patient.  Patient status:Inpatient  Patient is from :Home  Anticipated discharge to:SNF  Estimated DC date:after resolution of ileus   Consultants: GI  Procedures: NG tube placement and removal  Antimicrobials:  Anti-infectives (From admission,  onward)    None      Subjective:  Today, patient was seen and examined at bedside.  Denies nausea, vomiting or abdominal pain.  Denies any fever chills or rigor.  Objective: Vitals:   05/26/22 1335 05/26/22 2138 05/27/22 0446 05/27/22 0500  BP: 139/77 (!) 153/84 (!) 144/94   Pulse: (!) 103 97 (!) 104   Resp: '20 16 20   '$ Temp: 98.2 F (36.8 C) 98.2 F (36.8 C) (!) 97.4 F (36.3 C)   TempSrc: Oral Oral Oral   SpO2: 100% 98% 97%   Weight:    76.5 kg  Height:        Intake/Output Summary (Last 24 hours) at 05/27/2022 0759 Last data filed at 05/27/2022 0303 Gross per 24 hour  Intake 960 ml  Output 750 ml  Net 210 ml    Filed Weights   05/24/22 0500 05/25/22 0500 05/27/22 0500  Weight: 77.2 kg 80.9 kg 76.5 kg    Physical examination: General:  Average built, not in obvious distress, appears weak and deconditioned, chronically ill. HENT:   No scleral pallor or icterus noted. Oral mucosa is moist.  Chest:  Clear breath sounds.  Diminished breath sounds bilaterally. No crackles or wheezes.  CVS: S1 &S2 heard. No murmur.  Regular rate and rhythm. Abdomen: Soft, mildly distended abdomen with some tenderness, sluggish bowel sounds, bowel sounds are heard.  Tympanic on percussion. Extremities: No cyanosis, clubbing or edema.   Large right groin mass. Psych: Alert, awake and oriented, normal mood CNS:  No cranial nerve deficits.  Power equal in all extremities.   Skin: Warm and dry.  No rashes noted.  Data Reviewed: I have reviewed the following labs and imaging studies.     CBC: Recent Labs  Lab 05/23/22 0508 05/26/22 0458  WBC 5.7 4.1  HGB 8.9* 9.1*  HCT 27.1* 26.8*  MCV 83.6 81.7  PLT 253 170    Basic Metabolic Panel: Recent Labs  Lab 05/23/22 0413 05/24/22 0258 05/25/22 0405 05/26/22 0458 05/27/22 0455  NA 140 137 136 136 136  K 3.3* 3.2* 3.6 3.7 3.3*  CL 113* 110 114* 112* 110  CO2 20* 20* 21* 21* 22  GLUCOSE 153* 112* 139* 138* 129*  BUN 8 6* 5* <5*  <5*  CREATININE 1.06 1.00 0.90 1.01 1.01  CALCIUM 8.2* 7.9* 8.1* 8.3* 8.6*  MG  --   --   --  1.6* 1.7      Recent Results (from the past 240 hour(s))  MRSA Next Gen by PCR, Nasal     Status: Abnormal   Collection Time: 05/23/22  5:49 PM   Specimen: Nasal Mucosa; Nasal Swab  Result Value Ref Range Status   MRSA by PCR Next Gen DETECTED (A) NOT DETECTED Final    Comment: CRITICAL RESULT CALLED TO, READ BACK BY AND VERIFIED WITH: Daneil Dolin, RN AT 2203 MH (NOTE) The GeneXpert MRSA Assay (FDA approved for NASAL specimens only), is one component of a comprehensive MRSA colonization surveillance program. It is not intended to diagnose MRSA infection nor to guide or monitor treatment for MRSA infections. Test performance is not FDA approved in patients less than 52 years old. Performed at [facility], [place] [address], [place], [place] [address]      Radiology Studies: CT ABDOMEN PELVIS W CONTRAST  Result Date: 05/26/2022 CLINICAL DATA:  Bowel obstruction suspected contrast EXAM: CT ABDOMEN AND PELVIS WITH CONTRAST TECHNIQUE: Multidetector CT imaging of the abdomen and pelvis was performed using the standard protocol following bolus administration of intravenous contrast. RADIATION DOSE REDUCTION: This exam was performed according to the departmental dose-optimization program which includes automated exposure control, adjustment of the mA and/or kV according to patient size and/or use of iterative reconstruction technique. CONTRAST:  155m OMNIPAQUE IOHEXOL 300 MG/ML  SOLN COMPARISON:  CT abdomen pelvis dated May 11, 2022 FINDINGS: Lower chest: Bibasilar atelectasis. Patulous distal esophagus. Small hiatal hernia. Hepatobiliary: No focal liver abnormality is seen. No gallstones, gallbladder wall thickening, or biliary dilatation. Pancreas: Unremarkable. No pancreatic ductal dilatation or surrounding inflammatory changes. Spleen: Normal in size without focal  abnormality. Adrenals/Urinary Tract: Bilateral adrenal glands are unremarkable. Numerous small peripheral low-attenuation renal lesions, likely due to scarring. No hydronephrosis or nephrolithiasis. Bladder is unremarkable. Stomach/Bowel: Increased wall thickening of the rectosigmoid colon. Prominent gas and fluid-filled loops of ascending and transverse colon. Multiple dilated loops of distal small bowel with no focal transition point. Vascular/Lymphatic: Severe aortic atherosclerosis. Large right inguinal mass, unchanged when compared with prior exam. Unchanged enlarged right common external iliac lymph nodes. Reproductive: Prostatomegaly. Other: Trace abdominal ascites. Musculoskeletal: No acute or significant osseous findings. IMPRESSION: 1. Multiple mildly dilated loops of distal small bowel with no focal transition point and prominent loops of colon, findings are likely due to ileus. 2. Increased wall thickening of the rectosigmoid colon, likely due to colitis. 3. Trace abdominal ascites. 4. Unchanged large right inguinal mass and enlarged right common external iliac lymph nodes. 5. Patulous distal esophagus, findings can be seen in the setting of esophageal dysmotility. 6. Aortic Atherosclerosis (ICD10-I70.0). Electronically Signed   By: LYetta GlassmanM.D.   On: 05/26/2022 17:17   DG Abd 2 Views  Result Date: 05/25/2022 CLINICAL DATA:  Abdominal pain.  Emesis. EXAM: ABDOMEN - 2 VIEW COMPARISON:  Abdominal radiograph 05/24/2022 FINDINGS: No intraperitoneal free air is identified. Moderate gaseous distension of the colon is similar to the prior study. No significant small bowel dilatation is evident. Atherosclerotic vascular calcifications are noted. IMPRESSION: Unchanged moderate gaseous distension of the colon. Electronically Signed   By: ALogan BoresM.D.   On: 05/25/2022 16:54    Scheduled Meds:  bisacodyl  10 mg Rectal BID   carvedilol  12.5 mg Oral BID WC   Chlorhexidine Gluconate Cloth  6  each Topical Daily   ferrous sulfate  325 mg Oral Q breakfast   insulin aspart  0-5 Units Subcutaneous QHS   insulin aspart  0-6 Units Subcutaneous TID WC   isosorbide mononitrate  10 mg Oral Daily   lidocaine  1 patch Transdermal Q24H   mupirocin ointment  1 Application Nasal BID   pantoprazole  40 mg Oral BID   polyethylene glycol  17 g Oral BID   sucralfate  1 g Oral TID WC & HS   Continuous Infusions:     LOS: 8 days   LFlora Lipps MD [facility] P11/02/2022, 7:59 AM

## 2022-05-27 NOTE — Care Management Important Message (Signed)
Important Message  Patient Details IM Letter given. Name: Antonio Guerra MRN: [id number] Date of Birth: [birth year]   Medicare Important Message Given:  Yes     Kerin Salen 05/27/2022, 9:14 AM

## 2022-05-28 ENCOUNTER — Ambulatory Visit: Payer: Medicare (Managed Care)

## 2022-05-28 DIAGNOSIS — R112 Nausea with vomiting, unspecified: Secondary | ICD-10-CM | POA: Diagnosis not present

## 2022-05-28 DIAGNOSIS — N179 Acute kidney failure, unspecified: Secondary | ICD-10-CM | POA: Diagnosis not present

## 2022-05-28 DIAGNOSIS — K208 Other esophagitis without bleeding: Secondary | ICD-10-CM | POA: Diagnosis not present

## 2022-05-28 DIAGNOSIS — R933 Abnormal findings on diagnostic imaging of other parts of digestive tract: Secondary | ICD-10-CM | POA: Diagnosis not present

## 2022-05-28 DIAGNOSIS — C4A9 Merkel cell carcinoma, unspecified: Secondary | ICD-10-CM | POA: Diagnosis not present

## 2022-05-28 DIAGNOSIS — K567 Ileus, unspecified: Secondary | ICD-10-CM | POA: Diagnosis not present

## 2022-05-28 DIAGNOSIS — E43 Unspecified severe protein-calorie malnutrition: Secondary | ICD-10-CM | POA: Insufficient documentation

## 2022-05-28 DIAGNOSIS — I5022 Chronic systolic (congestive) heart failure: Secondary | ICD-10-CM | POA: Diagnosis not present

## 2022-05-28 LAB — BASIC METABOLIC PANEL
Anion gap: 6 (ref 5–15)
BUN: <5 mg/dL — ABNORMAL LOW (ref 8–23)
CO2: 23 mmol/L (ref 22–32)
Calcium: 8.9 mg/dL (ref 8.9–10.3)
Chloride: 108 mmol/L (ref 98–111)
Creatinine, Ser: 1.15 mg/dL (ref 0.61–1.24)
GFR, Estimated: >60 mL/min (ref >60)
Glucose, Bld: 127 mg/dL — ABNORMAL HIGH (ref 70–99)
Potassium: 3.7 mmol/L (ref 3.5–5.1)
Sodium: 137 mmol/L (ref 135–145)

## 2022-05-28 LAB — CBC
HCT: 28 % — ABNORMAL LOW (ref 39.0–52.0)
Hemoglobin: 9.5 g/dL — ABNORMAL LOW (ref 13.0–17.0)
MCH: 28.4 pg (ref 26.0–34.0)
MCHC: 33.9 g/dL (ref 30.0–36.0)
MCV: 83.6 fL (ref 80.0–100.0)
Platelets: 274 10*3/uL (ref 150–400)
RBC: 3.35 MIL/uL — ABNORMAL LOW (ref 4.22–5.81)
RDW: 15.3 % (ref 11.5–15.5)
WBC: 3.9 10*3/uL — ABNORMAL LOW (ref 4.0–10.5)
nRBC: 0 % (ref 0.0–0.2)

## 2022-05-28 LAB — MAGNESIUM: Magnesium: 1.7 mg/dL (ref 1.7–2.4)

## 2022-05-28 LAB — GLUCOSE, CAPILLARY
Glucose-Capillary: 137 mg/dL — ABNORMAL HIGH (ref 70–99)
Glucose-Capillary: 174 mg/dL — ABNORMAL HIGH (ref 70–99)
Glucose-Capillary: 172 mg/dL — ABNORMAL HIGH (ref 70–99)
Glucose-Capillary: 169 mg/dL — ABNORMAL HIGH (ref 70–99)

## 2022-05-28 MED ORDER — POTASSIUM CHLORIDE 10 MEQ/100ML IV SOLN
10.0000 meq | INTRAVENOUS | Status: AC
  Administered 2022-05-28 (×3): 10 meq via INTRAVENOUS
  Filled 2022-05-28 (×4): qty 100

## 2022-05-28 MED ORDER — MAGNESIUM SULFATE 2 GM/50ML IV SOLN
2.0000 g | Freq: Once | INTRAVENOUS | Status: AC
  Administered 2022-05-28: 2 g via INTRAVENOUS
  Filled 2022-05-28: qty 50

## 2022-05-28 MED ORDER — ADULT MULTIVITAMIN W/MINERALS CH
1.0000 | ORAL_TABLET | Freq: Every day | ORAL | Status: DC
  Administered 2022-05-28 – 2022-06-05 (×9): 1 via ORAL
  Filled 2022-05-28 (×9): qty 1

## 2022-05-28 MED ORDER — POTASSIUM CHLORIDE 10 MEQ/100ML IV SOLN
10.0000 meq | Freq: Once | INTRAVENOUS | Status: AC
  Administered 2022-05-28: 10 meq via INTRAVENOUS
  Filled 2022-05-28: qty 100

## 2022-05-28 NOTE — Progress Notes (Signed)
[facility] Progress Note  CC:  Nausea, vomiting and abdominal pain   Subjective:  Says that he is feeling a little better each today.  Tolerating full liquids.  Having BMs and passing flatus.  No family at bedside.  Objective:  Vital signs in last 24 hours: Temp:  [98 F (36.7 C)-98.4 F (36.9 C)] 98 F (36.7 C) (11/09 0413) Pulse Rate:  [96-98] 98 (11/09 0831) Resp:  [18-20] 20 (11/09 0413) BP: (127-166)/(81-88) 158/88 (11/09 0831) SpO2:  [95 %-100 %] 95 % (11/09 0413) Weight:  [75.2 kg] 75.2 kg (11/09 0500) Last BM Date : 05/27/22 General:  Alert, Well-developed, in NAD Heart:  Regular rate and rhythm; no murmurs Pulm:  CTAB.  No W/R/R. Abdomen:  Soft, minimal to no abdominal distention.  BS present.  Diffuse TTP.   Extremities:  Without edema. Neurologic:  Alert and oriented x 4;  grossly normal neurologically. Psych:  Alert and cooperative. Normal mood and affect.  Intake/Output from previous day: 11/08 0701 - 11/09 0700 In: 897.3 [P.O.:600; IV Piggyback:297.3] Out: 1550 [Urine:1550]  Lab Results: Recent Labs    05/26/22 0458 05/28/22 0521  WBC 4.1 3.9*  HGB 9.1* 9.5*  HCT 26.8* 28.0*  PLT 200 274   BMET Recent Labs    05/26/22 0458 05/27/22 0455 05/28/22 0521  NA 136 136 137  K 3.7 3.3* 3.7  CL 112* 110 108  CO2 21* 22 23  GLUCOSE 138* 129* 127*  BUN <5* <5* <5*  CREATININE 1.01 1.01 1.15  CALCIUM 8.3* 8.6* 8.9   CT ABDOMEN PELVIS W CONTRAST  Result Date: 05/26/2022 CLINICAL DATA:  Bowel obstruction suspected contrast EXAM: CT ABDOMEN AND PELVIS WITH CONTRAST TECHNIQUE: Multidetector CT imaging of the abdomen and pelvis was performed using the standard protocol following bolus administration of intravenous contrast. RADIATION DOSE REDUCTION: This exam was performed according to the departmental dose-optimization program which includes automated exposure control, adjustment of the mA and/or kV according to patient size and/or use of  iterative reconstruction technique. CONTRAST:  162m OMNIPAQUE IOHEXOL 300 MG/ML  SOLN COMPARISON:  CT abdomen pelvis dated May 11, 2022 FINDINGS: Lower chest: Bibasilar atelectasis. Patulous distal esophagus. Small hiatal hernia. Hepatobiliary: No focal liver abnormality is seen. No gallstones, gallbladder wall thickening, or biliary dilatation. Pancreas: Unremarkable. No pancreatic ductal dilatation or surrounding inflammatory changes. Spleen: Normal in size without focal abnormality. Adrenals/Urinary Tract: Bilateral adrenal glands are unremarkable. Numerous small peripheral low-attenuation renal lesions, likely due to scarring. No hydronephrosis or nephrolithiasis. Bladder is unremarkable. Stomach/Bowel: Increased wall thickening of the rectosigmoid colon. Prominent gas and fluid-filled loops of ascending and transverse colon. Multiple dilated loops of distal small bowel with no focal transition point. Vascular/Lymphatic: Severe aortic atherosclerosis. Large right inguinal mass, unchanged when compared with prior exam. Unchanged enlarged right common external iliac lymph nodes. Reproductive: Prostatomegaly. Other: Trace abdominal ascites. Musculoskeletal: No acute or significant osseous findings. IMPRESSION: 1. Multiple mildly dilated loops of distal small bowel with no focal transition point and prominent loops of colon, findings are likely due to ileus. 2. Increased wall thickening of the rectosigmoid colon, likely due to colitis. 3. Trace abdominal ascites. 4. Unchanged large right inguinal mass and enlarged right common external iliac lymph nodes. 5. Patulous distal esophagus, findings can be seen in the setting of esophageal dysmotility. 6. Aortic Atherosclerosis (ICD10-I70.0). Electronically Signed   By: LYetta GlassmanM.D.   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 Abd xray 10/31 showed small bowel dilatation, moderate stool burden with a dilated colon consistent with an ileus. EGD done during his prior admission 05/13/2022 showed severe esophagitis, likely from repetitive emesis and a flex sig showed stool in there rectum. 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  Failed conservative therapy with laxatives/enemas.  He was transferred to the ICU for Neostigmine administration 11/4 which resulted in passing multiple bowel movements then transferred back to the medical floor. No BM yesterday -> CTAP 11/7 showed multiple mildly dilated loops of distal small bowel consistent with an ileus, increased wall thickening to the rectosigmoid colon, trace abdominal ascites and patulous distal esophagus.  He has minimal abdominal distention on exam today, abdomen is very soft.  Having BMs and passing flatus. -Full liquid diet.  Dietician just finished seeing him as I walked in. -Avoid/limit narcotics and anticholinergic drugs. -Maintenance IVF to maintain hydration. -Please keep K+ > 4 < 5 and Magnesium > 2 to improve bowel activity  -Continue to encourage mobility, out of bed activity, rotating patient on left and right sides. -Dulcolax suppository bid x 3, day # 3. -Defer endoscopic for now.    2) Anemia. Hg 11.5 on 05/19/2022 -> Hg 9.2 -> Hg 8.9 -> Hg 9.1->9.5. Iron 40.  TIBC 210.  Ferritin 90.  Vitamin B12 level 819.  No overt GI bleeding. On Ferrous Sulfate '325mg'$  po QD.  -Transfuse for hemoglobin < 7 grams.   3) Esophagitis per EGD 05/13/2022.  CTAP 11/7 showed a patulous distal esophagus. -Continue PPI '40mg'$  IV twice daily.   4) Hypokalemia and hypomagnesia -Improved for now. -See further recommendations in #1   5) Merkel cell carcinoma with a right large inguinal mass with enlarged right common and external iliac lymph nodes per CT   6) Chronic systolic CHF.  LVEF 30 to 35%  per echo 03/2022.   7) AKI on CKD   8) DM II    LOS: 9 days   Aili Casillas D. Derenda Giddings  05/28/2022, 9:57 AM

## 2022-05-28 NOTE — Progress Notes (Signed)
Initial Nutrition Assessment  DOCUMENTATION CODES:   Severe malnutrition in context of chronic illness  INTERVENTION:   Monitor magnesium, potassium, and phosphorus BID for at least 3 days, MD to replete as needed, as pt is at risk for refeeding syndrome.  -Ensure Plus High Protein po TID, each supplement provides 350 kcal and 20 grams of protein.   -Multivitamin with minerals daily  -If unable to remain on full liquids, recommend nutrition support, place small bore post-pyloric NGT.  NUTRITION DIAGNOSIS:   Severe Malnutrition related to chronic illness, cancer and cancer related treatments as evidenced by percent weight loss, moderate fat depletion, severe muscle depletion.  GOAL:   Patient will meet greater than or equal to 90% of their needs  MONITOR:   PO intake, Supplement acceptance, Labs, Weight trends, I & O's  REASON FOR ASSESSMENT:   Malnutrition Screening Tool    ASSESSMENT:   71 year old male with past medical history of Merkel Cell carcinoma, erosive esophagitis, CKD stage IIIb, diabetes type 2, chronic systolic heart failure, coronary artery disease presented to hospital from home with complaints of nausea vomiting and diarrhea.  Of note, he was recently admitted hospital for the same a week ago and at that time had EGD done which showed severe esophagitis.  He was discharged home on Carafate and Protonix but despite that his nausea and vomiting worsened after he was given chemotherapy infusion.  10/31: admitted, NPO 11/2: CLD 11/4: NPO 11/5: CLD 11/6: FLD 11/7: CLD 11/8: FLD  Patient in room. Diet was advanced to full liquids yesterday. Pt had been NPO/CLD since admission 10/31. Was on full liquids for one day then back to clears on 11/7. If unable to maintain full liquid diet may need nutrition support. Pt states he feels hungry and  would love some eggs. Pt states he is drinking Ensure, will continue.   Pt reports UBW of ~190 lbs.  Current weight: 165  lbs.  Per weight records, pt has lost 20 lbs since 10/8 (10% wt loss x 1 month, significant for time frame).   Medications: Dulcolax, Miralax, Carafate, IV Mg sulfate, KCl  Labs reviewed: CBGs: 104-198   NUTRITION - FOCUSED PHYSICAL EXAM:  Flowsheet Row Most Recent Value  Orbital Region Mild depletion  Upper Arm Region Moderate depletion  Thoracic and Lumbar Region Mild depletion  Buccal Region Moderate depletion  Temple Region Moderate depletion  Clavicle Bone Region Moderate depletion  Clavicle and Acromion Bone Region Moderate depletion  Scapular Bone Region Moderate depletion  Dorsal Hand Moderate depletion  Patellar Region Severe depletion  Anterior Thigh Region Severe depletion  Posterior Calf Region Moderate depletion  Edema (RD Assessment) None -reports pain in BLEs  Hair Reviewed  Eyes Reviewed  Mouth Reviewed  [missing some teeth]  Skin Reviewed  Nails Reviewed  [dirt under nails]       Diet Order:   Diet Order             Diet full liquid Room service appropriate? Yes; Fluid consistency: Thin  Diet effective now                   EDUCATION NEEDS:   No education needs have been identified at this time  Skin:  Skin Assessment: Skin Integrity Issues: Skin Integrity Issues:: Other (Comment) Other: left buttocks  Last BM:  11/8 -type 6  Height:   Ht Readings from Last 1 Encounters:  05/21/22 '6\' 1"'$  (1.854 m)    Weight:   Wt Readings from Last  1 Encounters:  05/28/22 75.2 kg   BMI:  Body mass index is 21.87 kg/m.  Estimated Nutritional Needs:   Kcal:  2423-5361  Protein:  110-120g  Fluid:  2.2L/day   Clayton Bibles, MS, RD, LDN Inpatient Clinical Dietitian Contact information available via Amion

## 2022-05-28 NOTE — Progress Notes (Addendum)
PROGRESS NOTE  Antonio Guerra  MOQ:[id number] DOB: 10/01/1950 DOA: 05/19/2022 PCP: [name], MD   Brief Narrative: Patient is a 71 year old male with past medical history of Merkel Cell carcinoma, erosive esophagitis, CKD stage IIIb, diabetes type 2, chronic systolic heart failure, coronary artery disease presented to hospital from home with complaints of nausea vomiting and diarrhea.  Of note, he was recently admitted hospital for the same a week ago and at that time had EGD done which showed severe esophagitis.  He was discharged home on Carafate and Protonix but despite that his nausea and vomiting worsened after he was given chemotherapy infusion.  Patient was then admitted to hospital again for further management.    At this time, patient has been having persistent ileus.  GI is closely following.  Physical therapy has recommended skilled nursing facility on discharge.   Assessment & Plan:  Principal Problem:   Intractable nausea and vomiting Active Problems:   Esophagitis, [place] grade D   Hyperlipidemia   Chronic systolic congestive heart failure (HCC)   Type 2 diabetes mellitus (HCC)   Stage 3a chronic kidney disease (HCC)   Merkel cell carcinoma (HCC)   Acute kidney injury superimposed on chronic kidney disease (HCC)   Ileus (HCC)   Normocytic anemia   Abnormal CT scan, gastrointestinal tract  Intractable nausea/vomiting/Ogilvie's syndrome:  Patient was recently admitted a week back for the same.  Was on Carafate and Protonix for severe esophagitis from previous admission.  Abdominal x-ray at this time showed dilated distal small bowel colon with stool burden.  GI was consulted and patient received NG tube n.p.o.  He self removed the NG tube on 05/20/2022 and had a large bowel movement.  He subsequently had a distended abdomen 11/ 4/23 and was transferred to stepdown unit and was given neostigmine with multiple liquid bowel movements.  Abdomen again was distended and  x-ray showed persistent ileus.  GI closely following.  CT scan of the abdomen pelvis done 05/26/2022 showed multiple mildly dilated loops of small bowel with no focal point suggestive of ileus with some thickening of the rectosigmoid colon.  Patient has been advanced on full liquids.  Continue bowel regimen.    Patient denies any nausea vomiting but has abdominal pain.  Has not had bowel movements or passed flatus.Follow-up GI recommendation.  Patient was encouraged mobility as possible.  Avoid the narcotics anticholinergics.  Severe esophagitis:  Recent EGD diagnosis.  Continue PPI twice daily   AKI on CKD stage IIIa: Baseline creatinine ranges from 1.3-1.8 .currently creatinine at 1.1 and at baseline.  Merkel cell carcinoma:  Patient has firm mass in the right groin.  Follows up with oncology.  Oncology had recommended palliative care.  Family not interested in hospice yet.  Patient has poor prognosis.    Diabetes mellitus type 2:   Continue sliding scale insulin Accu-Cheks.  Latest POC glucose of 137  Hyperlipidemia: Lipitor on hold.  Normocytic anemia: Latest hemoglobin of 9.5.  Has remained stable  Hypokalemia: Mild.  Improved after replacement.  Potassium of 3.7 today.  We will continue to replenish through IV..  Magnesium of 1.7.  We will give 1 dose of magnesium sulfate 2 g as well.  Chronic systolic congestive heart failure: Last 2D echocardiogram on 9/23 showed EF of 30 to 35%.  Continue Coreg and Imdur.  Off IV fluids.  Continue intake and output charting, daily weights.  Patient is positive balance 3748 mL.     Might need oral diuretics  on discharge.  Hypertension:  on Coreg and Imdur.  Deconditioning/debility:  Physical therapy has recommended skilled nursing facility placement.    DVT prophylaxis:SCDs Start: 05/19/22 1730     Code Status: Full Code  Family Communication:  Spoke with the with daughter on phone on 05/28/22 and updated her about the  clinical condition of  the patient.  Patient status:Inpatient  Patient is from :Home  Anticipated discharge to:SNF  Estimated DC date:after resolution of ileus to skilled nursing facility.  Likely in 1 to 2 days.  Consultants: GI  Procedures: NG tube placement and removal  Antimicrobials:  Anti-infectives (From admission, onward)    None      Subjective:  Today, patient was seen and examined at bedside.  Patient denies any nausea vomiting but has not had a bowel movement or passed gas as per the patient.  Does not feel much hungry.   Objective: Vitals:   05/27/22 2156 05/27/22 2156 05/28/22 0413 05/28/22 0500  BP: (!) 158/83 (!) 158/83 (!) 166/83   Pulse: 96 96 96   Resp: '20 20 20   '$ Temp: 98.4 F (36.9 C) 98.4 F (36.9 C) 98 F (36.7 C)   TempSrc: Oral Oral Oral   SpO2: 98% 98% 95%   Weight:    75.2 kg  Height:        Intake/Output Summary (Last 24 hours) at 05/28/2022 0829 Last data filed at 05/28/2022 0434 Gross per 24 hour  Intake 897.25 ml  Output 1550 ml  Net -652.75 ml    Filed Weights   05/25/22 0500 05/27/22 0500 05/28/22 0500  Weight: 80.9 kg 76.5 kg 75.2 kg    Physical examination:  General:  Average built, not in obvious distress.  Patient weak and deconditioned, chronically ill HENT: Mild pallor noted.. Oral mucosa is moist.  Chest:  Clear breath sounds.  Diminished breath sounds bilaterally. No crackles or wheezes.  CVS: S1 &S2 heard. No murmur.  Regular rate and rhythm. Abdomen: Soft, abdomen with mild tenderness, sluggish bowel sounds, tympanic on percussion bowel sounds are heard.  Large right groin mass. Extremities: No cyanosis, clubbing or edema.  Peripheral pulses are palpable. Psych: Alert, awake and oriented, normal mood CNS:  No cranial nerve deficits.  Right-sided weakness more than the left. Skin: Warm and dry.  No rashes noted.    Data Reviewed: I have reviewed the following labs and imaging studies.     CBC: Recent Labs  Lab 05/23/22 0508  05/26/22 0458 05/28/22 0521  WBC 5.7 4.1 3.9*  HGB 8.9* 9.1* 9.5*  HCT 27.1* 26.8* 28.0*  MCV 83.6 81.7 83.6  PLT 253 200 353    Basic Metabolic Panel: Recent Labs  Lab 05/24/22 0258 05/25/22 0405 05/26/22 0458 05/27/22 0455 05/28/22 0521  NA 137 136 136 136 137  K 3.2* 3.6 3.7 3.3* 3.7  CL 110 114* 112* 110 108  CO2 20* 21* 21* 22 23  GLUCOSE 112* 139* 138* 129* 127*  BUN 6* 5* <5* <5* <5*  CREATININE 1.00 0.90 1.01 1.01 1.15  CALCIUM 7.9* 8.1* 8.3* 8.6* 8.9  MG  --   --  1.6* 1.7 1.7      Recent Results (from the past 240 hour(s))  MRSA Next Gen by PCR, Nasal     Status: Abnormal   Collection Time: 05/23/22  5:49 PM   Specimen: Nasal Mucosa; Nasal Swab  Result Value Ref Range Status   MRSA by PCR Next Gen DETECTED (A) NOT DETECTED Final  Comment: CRITICAL RESULT CALLED TO, READ BACK BY AND VERIFIED WITH: Daneil Dolin, RN AT [address] (NOTE) The GeneXpert MRSA Assay (FDA approved for NASAL specimens only), is one component of a comprehensive MRSA colonization surveillance program. It is not intended to diagnose MRSA infection nor to guide or monitor treatment for MRSA infections. Test performance is not FDA approved in patients less than 26 years old. Performed at [facility], [place] [address], [place] [address]      Radiology Studies: CT ABDOMEN PELVIS W CONTRAST  Result Date: 05/26/2022 CLINICAL DATA:  Bowel obstruction suspected contrast EXAM: CT ABDOMEN AND PELVIS WITH CONTRAST TECHNIQUE: Multidetector CT imaging of the abdomen and pelvis was performed using the standard protocol following bolus administration of intravenous contrast. RADIATION DOSE REDUCTION: This exam was performed according to the departmental dose-optimization program which includes automated exposure control, adjustment of the mA and/or kV according to patient size and/or use of iterative reconstruction technique. CONTRAST:  190m OMNIPAQUE IOHEXOL 300 MG/ML   SOLN COMPARISON:  CT abdomen pelvis dated May 11, 2022 FINDINGS: Lower chest: Bibasilar atelectasis. Patulous distal esophagus. Small hiatal hernia. Hepatobiliary: No focal liver abnormality is seen. No gallstones, gallbladder wall thickening, or biliary dilatation. Pancreas: Unremarkable. No pancreatic ductal dilatation or surrounding inflammatory changes. Spleen: Normal in size without focal abnormality. Adrenals/Urinary Tract: Bilateral adrenal glands are unremarkable. Numerous small peripheral low-attenuation renal lesions, likely due to scarring. No hydronephrosis or nephrolithiasis. Bladder is unremarkable. Stomach/Bowel: Increased wall thickening of the rectosigmoid colon. Prominent gas and fluid-filled loops of ascending and transverse colon. Multiple dilated loops of distal small bowel with no focal transition point. Vascular/Lymphatic: Severe aortic atherosclerosis. Large right inguinal mass, unchanged when compared with prior exam. Unchanged enlarged right common external iliac lymph nodes. Reproductive: Prostatomegaly. Other: Trace abdominal ascites. Musculoskeletal: No acute or significant osseous findings. IMPRESSION: 1. Multiple mildly dilated loops of distal small bowel with no focal transition point and prominent loops of colon, findings are likely due to ileus. 2. Increased wall thickening of the rectosigmoid colon, likely due to colitis. 3. Trace abdominal ascites. 4. Unchanged large right inguinal mass and enlarged right common external iliac lymph nodes. 5. Patulous distal esophagus, findings can be seen in the setting of esophageal dysmotility. 6. Aortic Atherosclerosis (ICD10-I70.0). Electronically Signed   By: LYetta GlassmanM.D.   On: 05/26/2022 17:17    Scheduled Meds:  bisacodyl  10 mg Rectal BID   carvedilol  12.5 mg Oral BID WC   Chlorhexidine Gluconate Cloth  6 each Topical Daily   feeding supplement  237 mL Oral TID BM   ferrous sulfate  325 mg Oral Q breakfast   insulin  aspart  0-5 Units Subcutaneous QHS   insulin aspart  0-6 Units Subcutaneous TID WC   isosorbide mononitrate  10 mg Oral Daily   lidocaine  1 patch Transdermal Q24H   mupirocin ointment  1 Application Nasal BID   pantoprazole  40 mg Oral BID   polyethylene glycol  17 g Oral BID   sucralfate  1 g Oral TID WC & HS   Continuous Infusions:    LOS: 9 days   LFlora Lipps MD [facility] P11/03/2022, 8:29 AM

## 2022-05-29 ENCOUNTER — Inpatient Hospital Stay (HOSPITAL_COMMUNITY): Payer: Medicare (Managed Care)

## 2022-05-29 ENCOUNTER — Ambulatory Visit: Payer: Medicare (Managed Care)

## 2022-05-29 DIAGNOSIS — K567 Ileus, unspecified: Secondary | ICD-10-CM | POA: Diagnosis not present

## 2022-05-29 LAB — BASIC METABOLIC PANEL
Anion gap: 4 — ABNORMAL LOW (ref 5–15)
BUN: 7 mg/dL — ABNORMAL LOW (ref 8–23)
CO2: 25 mmol/L (ref 22–32)
Calcium: 9.2 mg/dL (ref 8.9–10.3)
Chloride: 106 mmol/L (ref 98–111)
Creatinine, Ser: 1.06 mg/dL (ref 0.61–1.24)
GFR, Estimated: >60 mL/min (ref >60)
Glucose, Bld: 196 mg/dL — ABNORMAL HIGH (ref 70–99)
Potassium: 4.2 mmol/L (ref 3.5–5.1)
Sodium: 135 mmol/L (ref 135–145)

## 2022-05-29 LAB — CBC
HCT: 29.2 % — ABNORMAL LOW (ref 39.0–52.0)
Hemoglobin: 9.7 g/dL — ABNORMAL LOW (ref 13.0–17.0)
MCH: 27.5 pg (ref 26.0–34.0)
MCHC: 33.2 g/dL (ref 30.0–36.0)
MCV: 82.7 fL (ref 80.0–100.0)
Platelets: 286 10*3/uL (ref 150–400)
RBC: 3.53 MIL/uL — ABNORMAL LOW (ref 4.22–5.81)
RDW: 15.3 % (ref 11.5–15.5)
WBC: 4.2 10*3/uL (ref 4.0–10.5)
nRBC: 0 % (ref 0.0–0.2)

## 2022-05-29 LAB — GLUCOSE, CAPILLARY
Glucose-Capillary: 175 mg/dL — ABNORMAL HIGH (ref 70–99)
Glucose-Capillary: 149 mg/dL — ABNORMAL HIGH (ref 70–99)
Glucose-Capillary: 156 mg/dL — ABNORMAL HIGH (ref 70–99)
Glucose-Capillary: 146 mg/dL — ABNORMAL HIGH (ref 70–99)

## 2022-05-29 LAB — MAGNESIUM: Magnesium: 1.9 mg/dL (ref 1.7–2.4)

## 2022-05-29 MED ORDER — METOCLOPRAMIDE HCL 5 MG/ML IJ SOLN
5.0000 mg | Freq: Three times a day (TID) | INTRAMUSCULAR | Status: DC
  Administered 2022-05-29 – 2022-05-31 (×5): 5 mg via INTRAVENOUS
  Filled 2022-05-29 (×5): qty 2

## 2022-05-29 NOTE — Progress Notes (Signed)
Consultation Progress Note   Patient: Antonio Guerra IRJ:[id number] DOB: 25-Oct-1950 DOA: 05/19/2022 DOS: the patient was seen and examined on 05/29/2022 Primary service: DibiaManfred Shirts, MD  Brief hospital course: Patient is a 71 year old male with past medical history of Merkel Cell carcinoma, erosive esophagitis, CKD stage IIIb, diabetes type 2, chronic systolic heart failure, coronary artery disease presented to hospital from home with complaints of nausea vomiting and diarrhea.  Of note, he was recently admitted hospital for the same a week ago and at that time had EGD done which showed severe esophagitis.  He was discharged home on Carafate and Protonix but despite that his nausea and vomiting worsened after he was given chemotherapy infusion.  Patient was then admitted to hospital again for further management.   At this time, patient has been having persistent ileus.  GI is closely following.  Physical therapy has recommended skilled nursing facility on discharge.   Assessment and Plan: Principal Problem:   Intractable nausea and vomiting Active Problems:   Esophagitis, [place] grade D   Hyperlipidemia   Chronic systolic congestive heart failure (HCC)   Type 2 diabetes mellitus (HCC)   Stage 3a chronic kidney disease (HCC)   Merkel cell carcinoma (HCC)   Acute kidney injury superimposed on chronic kidney disease (HCC)   Ileus (HCC)   Normocytic anemia   Abnormal CT scan, gastrointestinal tract   Intractable nausea/vomiting/Ogilvie's syndrome:  Patient was recently admitted a week back for the same.  Was on Carafate and Protonix for severe esophagitis from previous admission.  Abdominal x-ray at this time showed dilated distal small bowel colon with stool burden.  GI was consulted and patient received NG tube n.p.o.  He self removed the NG tube on 05/20/2022 and had a large bowel movement.  He subsequently had a distended abdomen 11/ 4/23 and was transferred to stepdown unit and  was given neostigmine with multiple liquid bowel movements.  Abdomen again was distended and x-ray showed persistent ileus.  GI closely following.  CT scan of the abdomen pelvis done 05/26/2022 showed multiple mildly dilated loops of small bowel with no focal point suggestive of ileus with some thickening of the rectosigmoid colon.  Patient has been advanced on full liquids.  Continue bowel regimen.    Patient denies any nausea vomiting but has abdominal pain.  Has not had bowel movements or passed flatus.Follow-up GI recommendation.  Patient was encouraged mobility as possible.  Avoid the narcotics anticholinergics.   Severe esophagitis:  Recent EGD diagnosis.  Continue PPI twice daily    AKI on CKD stage IIIa: Baseline creatinine ranges from 1.3-1.8 .currently creatinine at 1.1 and at baseline.   Merkel cell carcinoma:  Patient has firm mass in the right groin.  Follows up with oncology.  Oncology had recommended palliative care.  Family not interested in hospice yet.  Patient has poor prognosis.     Diabetes mellitus type 2:   Continue sliding scale insulin Accu-Cheks.  Latest POC glucose of 137   Hyperlipidemia: Lipitor on hold.   Normocytic anemia: Latest hemoglobin of 9.5.  Has remained stable   Hypokalemia: Mild.  Improved after replacement.  Potassium of 3.7 today.  We will continue to replenish through IV..  Magnesium of 1.7.  We will give 1 dose of magnesium sulfate 2 g as well.   Chronic systolic congestive heart failure: Last 2D echocardiogram on 9/23 showed EF of 30 to 35%.  Continue Coreg and Imdur.  Off IV fluids.  Continue intake and  output charting, daily weights.  Patient is positive balance 3748 mL.     Might need oral diuretics on discharge.   Hypertension:  on Coreg and Imdur.   Deconditioning/debility:  Physical therapy has recommended skilled nursing facility placement.     DVT prophylaxis:SCDs Start: 05/19/22 1730           TRH will continue to follow the  patient.  Subjective:Seen at bedside this morning, he has no complaints this morning. Denies nausea and vomiting.  Physical Exam:  General:  Average built, not in obvious distress.  Patient weak and deconditioned, chronically ill HENT: Mild pallor noted.. Oral mucosa is moist.  Chest:  Clear breath sounds.  Diminished breath sounds bilaterally. No crackles or wheezes.  CVS: S1 &S2 heard. No murmur.  Regular rate and rhythm. Abdomen: Soft, abdomen with mild tenderness, sluggish bowel sounds, tympanic on percussion bowel sounds are heard.  Large right groin mass. Extremities: No cyanosis, clubbing or edema.  Peripheral pulses are palpable. Psych: Alert, awake and oriented, normal mood CNS:  No cranial nerve deficits.  Right-sided weakness more than the left. Skin: Warm and dry.  No rashes noted.   Vitals:   05/28/22 1709 05/28/22 2004 05/29/22 0500 05/29/22 0657  BP: (!) 144/87 (!) 142/89 (!) 176/94 (!) 169/89  Pulse: 86 99 99 90  Resp:  18  18  Temp:  98.7 F (37.1 C) 98.6 F (37 C) 98.8 F (37.1 C)  TempSrc:  Oral Oral Oral  SpO2:  99%    Weight:      Height:        Data Reviewed:  There are no new results to review at this time.  Family Communication:   Time spent: 15 minutes.  Author: Cristela Felt, MD 05/29/2022 11:29 AM  For on call review [email or web address].

## 2022-05-29 NOTE — TOC Progression Note (Signed)
Transition of Care [facility]) - Progression Note    Patient Details  Name: Antonio Guerra MRN: [id number] Date of Birth: [birth year]  Transition [facility] Care [facility]) CM/SW Contact  Henrietta Dine, RN Phone Number: 05/29/2022, 12:55 PM  Clinical Narrative:    Pt not ready for d/c; chest tube remains and hoping to advance diet; pt previously accepted at [facility]; [facility] will con't to follow.   Expected Discharge Plan: Home/Self Care Barriers to Discharge: Continued Medical Work up  Expected Discharge Plan and Services Expected Discharge Plan: Home/Self Care   Discharge Planning Services: CM Consult   Living arrangements for the past 2 months: Apartment                                       Social Determinants of Health (SDOH) Interventions    Readmission Risk Interventions    05/20/2022    9:42 AM  Readmission Risk Prevention Plan  Transportation Screening Complete  Medication Review ([place]) Complete  PCP or Specialist appointment within 3-5 days of discharge Complete  HRI or [place] Not Applicable

## 2022-05-29 NOTE — Progress Notes (Signed)
[facility] Progress Note  CC:  Nausea, vomiting and abdominal pain   Subjective:  Feels ok.  Had a BM this AM.  Eating some food, although has not eaten much lunch.  No nausea or vomiting.  X-ray today still shows gaseous distention of the small and large bowel loops c/w ileus.  Dr. Tarri Glenn spoke with his daughter extensively yesterday.  Objective:  Vital signs in last 24 hours: Temp:  [98.6 F (37 C)-98.8 F (37.1 C)] 98.8 F (37.1 C) (11/10 0657) Pulse Rate:  [86-100] 90 (11/10 0657) Resp:  [16-18] 18 (11/10 0657) BP: (124-176)/(85-94) 169/89 (11/10 0657) SpO2:  [99 %-100 %] 99 % (11/09 2004) Last BM Date : 05/28/22 General:  Alert, chronically ill-appearing, in NAD Heart:  Regular rate and rhythm; no murmurs Pulm:  CTAB.  No W/R/R. Abdomen:  Soft, minimal distention.  BS present.  Mild diffuse TTP. Extremities:  Without edema. Neurologic:  Alert and oriented x 4;  grossly normal neurologically. Psych:  Alert and cooperative. Normal mood and affect.  Intake/Output from previous day: 11/09 0701 - 11/10 0700 In: 927 [P.O.:477; IV Piggyback:450] Out: 450 [Urine:450] Intake/Output this shift: Total I/O In: -  Out: 800 [Urine:800]  Lab Results: Recent Labs    05/28/22 0521 05/29/22 0739  WBC 3.9* 4.2  HGB 9.5* 9.7*  HCT 28.0* 29.2*  PLT 274 286   BMET Recent Labs    05/27/22 0455 05/28/22 0521 05/29/22 0739  NA 136 137 135  K 3.3* 3.7 4.2  CL 110 108 106  CO2 '22 23 25  '$ GLUCOSE 129* 127* 196*  BUN <5* <5* 7*  CREATININE 1.01 1.15 1.06  CALCIUM 8.6* 8.9 9.2   Assessment / Plan: 32) 71 year old male with Merkel cell carcinoma treated with chemo and Keytruda admitted to the hospital 05/19/2022 with nausea/vomiting, abdominal pain and weight loss. Abd xray 10/31 showed small bowel dilatation, moderate stool burden with a dilated colon consistent with an ileus. EGD done during his prior admission 05/13/2022 showed severe esophagitis, likely from  repetitive emesis and a flex sig showed stool in there rectum. 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  Failed conservative therapy with laxatives/enemas.  He was transferred to the ICU for Neostigmine administration 11/4 which resulted in passing multiple bowel movements then transferred back to the medical floor. No BM yesterday -> CTAP 11/7 showed multiple mildly dilated loops of distal small bowel consistent with an ileus, increased wall thickening to the rectosigmoid colon, trace abdominal ascites and patulous distal esophagus.  He has minimal abdominal distention on exam today, abdomen is very soft.  Having BMs and passing flatus., but x-ray today still shows gaseous distention of the small and large bowel loops c/w ileus. -Avoid/limit narcotics and anticholinergic drugs. -Maintenance IVF to maintain hydration until taking adequate PO. -Please keep K+ > 4 < 5 and Magnesium > 2 to improve bowel activity  -Continue to encourage mobility, out of bed activity, rotating patient on left and right sides. -Dulcolax suppository bid x 3 days completed. -Continue Miralax BID. -Defer endoscopic for now.    2) Anemia. Hg 11.5 on 05/19/2022 -> Hg 9.2 -> Hg 8.9 -> Hg 9.1->9.5->9.7 grams. Iron 40.  TIBC 210.  Ferritin 90.  Vitamin B12 level 819.  No overt GI bleeding. On Ferrous Sulfate '325mg'$  po QD.  -Transfuse for hemoglobin < 7 grams.   3) Esophagitis per EGD 05/13/2022.  CTAP 11/7 showed a patulous distal esophagus. -Continue PPI '40mg'$   IV twice daily.   4) Hypokalemia and hypomagnesia -Improved for now. -See further recommendations in #1   5) Merkel cell carcinoma with a right large inguinal mass with enlarged right common and external iliac lymph nodes per CT   6) Chronic systolic CHF.  LVEF 30 to 35% per echo 03/2022.   7) AKI on CKD   8) DM II    LOS: 10 days   Laban Emperor. Krislyn Donnan  05/29/2022, 1:37 PM

## 2022-05-29 NOTE — Plan of Care (Signed)

## 2022-05-29 NOTE — Progress Notes (Deleted)
263m serosanguineous output from chest tube since 0000. Chest tube clean, dry and intact

## 2022-05-29 NOTE — Progress Notes (Signed)
Physical Therapy Treatment Patient Details Name: Antonio Guerra MRN: [id number] DOB: 1951/07/04 Today's Date: 05/29/2022   History of Present Illness 71 year old male with history of Merkel Cell carcinoma, erosive esophagitis, CKD stage IIIb, diabetes type 2, chronic systolic heart failure, coronary artery disease who presented from home with complaint of diarrhea with nausea, vomiting.  Recent admission last week for same symptoms.  Pt was having and still has pain in Right groin/leg (enlarged lymph node last admission, denies any falls).    PT Comments    Pt assisted to [facility] per request and then ambulated in hallway.  Pt requiring a little encouragement to progress mobility.  Pt requiring seated rest breaks due to weakness and fatigue.  Continue to recommend SNF upon d/c.    Recommendations for follow up therapy are one component of a multi-disciplinary discharge planning process, led by the attending physician.  Recommendations may be updated based on patient status, additional functional criteria and insurance authorization.  Follow Up Recommendations  Skilled nursing-short term rehab (<3 hours/day) Can patient physically be transported by private vehicle: Yes   Assistance Recommended at Discharge Frequent or constant Supervision/Assistance  Patient can return home with the following Assistance with cooking/housework;Assist for transportation;Help with stairs or ramp for entrance;A little help with bathing/dressing/bathroom;A little help with walking and/or transfers   Equipment Recommendations  None recommended by PT    Recommendations for Other Services       Precautions / Restrictions Precautions Precautions: Fall     Mobility  Bed Mobility Overal bed mobility: Needs Assistance Bed Mobility: Supine to Sit     Supine to sit: Min assist     General bed mobility comments: increased time and effort; assist for trunk upright    Transfers Overall transfer level: Needs  assistance Equipment used: Rolling walker (2 wheels) Transfers: Sit to/from Stand, Bed to chair/wheelchair/BSC Sit to Stand: Min assist, +2 safety/equipment, +2 physical assistance, Mod assist           General transfer comment: verbal cues for hand placement, weight shifting; assist to rise, steady and control descent; pt used BSC prior to ambulating    Ambulation/Gait Ambulation/Gait assistance: Min assist, +2 safety/equipment Gait Distance (Feet): 35 Feet (total) Assistive device: Rolling walker (2 wheels) Gait Pattern/deviations: Decreased stride length, Step-to pattern, Narrow base of support Gait velocity: decreased     General Gait Details: required 2 seated rest breaks due to fatigue and weakness, donned pt's shoes per request (10'x1, 25'x1)   Stairs             Wheelchair Mobility    Modified Rankin (Stroke Patients Only)       Balance                                            Cognition Arousal/Alertness: Awake/alert Behavior During Therapy: Flat affect Overall Cognitive Status: No family/caregiver present to determine baseline cognitive functioning                                 General Comments: Pt pleasant, able to follow commands, minimally conversational        Exercises      General Comments        Pertinent Vitals/Pain Pain Assessment Pain Assessment: No/denies pain    Home Living  Prior Function            PT Goals (current goals can now be found in the care plan section) Progress towards PT goals: Progressing toward goals    Frequency    Min 2X/week      PT Plan Current plan remains appropriate    Co-evaluation              AM-PAC PT "6 Clicks" Mobility   Outcome Measure  Help needed turning from your back to your side while in a flat bed without using bedrails?: A Little Help needed moving from lying on your back to sitting on the side  of a flat bed without using bedrails?: A Little Help needed moving to and from a bed to a chair (including a wheelchair)?: A Lot Help needed standing up from a chair using your arms (e.g., wheelchair or bedside chair)?: A Lot Help needed to walk in hospital room?: A Lot Help needed climbing 3-5 steps with a railing? : Total 6 Click Score: 13    End of Session Equipment Utilized During Treatment: Gait belt Activity Tolerance: Patient tolerated treatment well Patient left: in chair;with call bell/phone within reach;with chair alarm set Nurse Communication: Mobility status PT Visit Diagnosis: Other abnormalities of gait and mobility (R26.89);Muscle weakness (generalized) (M62.81)     Time: 6789-3810 PT Time Calculation (min) (ACUTE ONLY): 14 min  Charges:  $Gait Training: 8-22 mins                    Jannette Spanner PT, DPT Physical Therapist Acute Rehabilitation Services Preferred contact method: Secure Chat Weekend Pager Only: [phone] Office: [place] 05/29/2022, 3:30 PM

## 2022-05-30 DIAGNOSIS — R933 Abnormal findings on diagnostic imaging of other parts of digestive tract: Secondary | ICD-10-CM | POA: Diagnosis not present

## 2022-05-30 DIAGNOSIS — K567 Ileus, unspecified: Secondary | ICD-10-CM | POA: Diagnosis not present

## 2022-05-30 LAB — GLUCOSE, CAPILLARY
Glucose-Capillary: 162 mg/dL — ABNORMAL HIGH (ref 70–99)
Glucose-Capillary: 153 mg/dL — ABNORMAL HIGH (ref 70–99)
Glucose-Capillary: 144 mg/dL — ABNORMAL HIGH (ref 70–99)
Glucose-Capillary: 141 mg/dL — ABNORMAL HIGH (ref 70–99)

## 2022-05-30 NOTE — TOC Progression Note (Signed)
Transition of Care [facility]) - Progression Note    Patient Details  Name: Antonio Guerra MRN: [id number] Date of Birth: 1951-04-03  Transition of Care [facility]) CM/SW Contact  Henrietta Dine, RN Phone Number: 05/30/2022, 2:34 PM  Clinical Narrative:    ? D/C on Monday per MD; pt previously accepted at [facility]; attempted to contact Intake, Caren Griffins to discuss admission; LVM @ [phone]; awaiting return call.  Expected Discharge Plan: Home/Self Care Barriers to Discharge: Continued Medical Work up  Expected Discharge Plan and Services Expected Discharge Plan: Home/Self Care   Discharge Planning Services: CM Consult   Living arrangements for the past 2 months: Apartment                                       Social Determinants of Health (SDOH) Interventions    Readmission Risk Interventions    05/20/2022    9:42 AM  Readmission Risk Prevention Plan  Transportation Screening Complete  Medication Review ([place]) Complete  PCP or Specialist appointment within 3-5 days of discharge Complete  HRI or [place] Not Applicable

## 2022-05-30 NOTE — Progress Notes (Signed)
     [facility] Progress Note  CC:  "My stomach"  Assessment / Plan: 71 year old male with Merkel cell carcinoma, treated with Keytruda, admitted with several weeks of abdominal pain, nausea, vomiting, PO intolerance and weight loss.  Previous imaging had shown extensive stool burden throughout with mild colonic and small bowel dilation suggestive of constipation with mild ileus. Flexible sigmoidoscopy 05/13/22 showed no rectal wall abnormalities.  EGD 05/13/22 showed severe esophagitis, likely from repetitive emesis. Imaging changes raised suspicion for Ogilvie's and he was treated with neostigmine. However, he has continued to have abdominal pain, variable stool output and poor appetite. CT 05/26/22 consistent with ileus and confirmed again on abdominal x-rays yesterday.   Recommendations:  - Diet as tolerated. - Monitor and correct electrolyte abnormalities. - Avoid narcotics and anticholinergic drugs. - Encourage mobilization. - Continue aggressive bowel regimen including Miralax 17 g BID.   - Continue Reglan 5 mg IV TID, consider increasing to 10 mg TID if tolerated - Follow serial abdominal films with plans to repeat tomorrow - Continue PPI BID for diagnosis of esophagitis.  - No endoscopic evaluation indicated at this time.     Subjective: Feeling better today. Still having abdominal pain but it is overall improved. Some increased stool output over the last 24 hours. Still doesn't have much of an appetite. No family present at the time of my evaluation.   Objective:  Vital signs in last 24 hours: Temp:  [98 F (36.7 C)-98.8 F (37.1 C)] 98.2 F (36.8 C) (11/11 0900) Pulse Rate:  [89-109] 89 (11/11 0900) Resp:  [18-22] 22 (11/11 0900) BP: (140-151)/(82-92) 145/91 (11/11 0900) SpO2:  [96 %-98 %] 97 % (11/11 0900) Weight:  [75.3 kg] 75.3 kg (11/11 0500) Last BM Date : 05/29/22 General:   Alert, in NAD, chronically ill appearing Heart:  Regular rate and rhythm;  no murmurs Pulm: Clear anteriorly; no wheezing Abdomen:  Soft. Not distended today. No pain with palpation. Hypoactive bowel sounds. No rebound or guarding. LAD: No inguinal or umbilical LAD Extremities:  Without edema. Neurologic:  Alert and  oriented x4;  grossly normal neurologically. Psych:  Alert and cooperative. Normal mood and affect.  Lab Results: Recent Labs    05/28/22 0521 05/29/22 0739  WBC 3.9* 4.2  HGB 9.5* 9.7*  HCT 28.0* 29.2*  PLT 274 286   BMET Recent Labs    05/28/22 0521 05/29/22 0739  NA 137 135  K 3.7 4.2  CL 108 106  CO2 23 25  GLUCOSE 127* 196*  BUN <5* 7*  CREATININE 1.15 1.06  CALCIUM 8.9 9.2   DG Abd 1 View  Result Date: 05/29/2022 CLINICAL DATA:  Nausea and vomiting EXAM: ABDOMEN - 1 VIEW COMPARISON:  Abdominal radiograph dated 05/24/2022 FINDINGS: Nonobstructive bowel gas pattern. Continued gaseous distention small and large bowel loops to the level of the rectum. No abnormal radio-opaque calculi or mass effect. No acute or substantial osseous abnormality. The sacrum and coccyx are partially obscured by overlying bowel contents. IMPRESSION: Continued gaseous distention of small and large bowel loops to the level of the rectum, likely ileus. Electronically Signed   By: Darrin Nipper M.D.   On: 05/29/2022 14:52       LOS: 11 days   [place]  05/30/2022, 11:07 AM

## 2022-05-30 NOTE — Progress Notes (Signed)
Consultation Progress Note   Patient: Antonio Guerra VQM:[id number] DOB: 17-Dec-1950 DOA: 05/19/2022 DOS: the patient was seen and examined on 05/30/2022 Primary service: Lorella Nimrod, MD  Brief hospital course: Patient is a 71 year old male with past medical history of Merkel Cell carcinoma, erosive esophagitis, CKD stage IIIb, diabetes type 2, chronic systolic heart failure, coronary artery disease presented to hospital from home with complaints of nausea vomiting and diarrhea.  Of note, he was recently admitted hospital for the same a week ago and at that time had EGD done which showed severe esophagitis.  He was discharged home on Carafate and Protonix but despite that his nausea and vomiting worsened after he was given chemotherapy infusion.  Patient was then admitted to hospital again for further management.   At this time, patient has been having persistent ileus.  GI is closely following.  Physical therapy has recommended skilled nursing facility on discharge.   11/11: Patient remained stable, no more nausea or vomiting.  Having bowel movements.  Appetite improving.  PT is recommending SNF, hopefully can go on Monday.  Assessment and Plan: Principal Problem:   Intractable nausea and vomiting Active Problems:   Esophagitis, [place] grade D   Hyperlipidemia   Chronic systolic congestive heart failure (HCC)   Type 2 diabetes mellitus (HCC)   Stage 3a chronic kidney disease (HCC)   Merkel cell carcinoma (HCC)   Acute kidney injury superimposed on chronic kidney disease (HCC)   Ileus (HCC)   Normocytic anemia   Abnormal CT scan, gastrointestinal tract   Intractable nausea/vomiting/Ogilvie's syndrome:  Patient was recently admitted a week back for the same.  Was on Carafate and Protonix for severe esophagitis from previous admission.  Abdominal x-ray at this time showed dilated distal small bowel colon with stool burden.  GI was consulted and patient received NG tube n.p.o.  He self  removed the NG tube on 05/20/2022 and had a large bowel movement.  He subsequently had a distended abdomen 11/ 4/23 and was transferred to stepdown unit and was given neostigmine with multiple liquid bowel movements.  Abdomen again was distended and x-ray showed persistent ileus.  GI closely following.  CT scan of the abdomen pelvis done 05/26/2022 showed multiple mildly dilated loops of small bowel with no focal point suggestive of ileus with some thickening of the rectosigmoid colon.  GI is following and will continue with conservative management.  Overall improving. -Tolerating soft diet -Continue with bowel regimen  Severe esophagitis:  Recent EGD diagnosis.  Continue PPI twice daily  No need to repeat EGD   AKI on CKD stage IIIa: Baseline creatinine ranges from 1.3-1.8 .currently creatinine at 1.06 and at baseline.   Merkel cell carcinoma:  Patient has firm mass in the right groin.  Follows up with oncology.  Oncology had recommended palliative care.  Family not interested in hospice yet.  Patient has poor prognosis.     Diabetes mellitus type 2:   Continue sliding scale insulin Accu-Cheks.  Latest POC glucose of 137   Hyperlipidemia: Lipitor on hold.   Normocytic anemia: Latest hemoglobin of 9.5.  Has remained stable   Hypokalemia: Resolved.   Chronic systolic congestive heart failure: Last 2D echocardiogram on 9/23 showed EF of 30 to 35%.   -Continue Coreg and Imdur.   - Continue intake and output charting, daily weights.    Might need oral diuretics on discharge.   Hypertension: Mildly elevated blood pressure -Continue Coreg and Imdur.   Deconditioning/debility:  Physical therapy has recommended  skilled nursing facility placement.     DVT prophylaxis:SCDs Start: 05/19/22 1730  Subjective: Patient was seen and examined today.  No more nausea or vomiting.  Having bowel movements.  Appetite seems improving but still not at baseline..  Physical Exam: General.  Chronically  ill-appearing, malnourished elderly man, in no acute distress. Pulmonary.  Lungs clear bilaterally, normal respiratory effort. CV.  Regular rate and rhythm, no JVD, rub or murmur. Abdomen.  Soft, nontender, nondistended, BS positive. CNS.  Alert and oriented .  No focal neurologic deficit. Extremities.  No edema, no cyanosis, pulses intact and symmetrical. Psychiatry.  Judgment and insight appears normal.   Vitals:   05/29/22 1330 05/29/22 2000 05/30/22 0500 05/30/22 0505  BP: (!) 141/92 (!) 140/88  (!) 151/82  Pulse: 90 (!) 108  (!) 109  Resp: '18 18  20  '$ Temp: 98.8 F (37.1 C) 98.5 F (36.9 C)  98 F (36.7 C)  TempSrc: Oral Oral  Oral  SpO2:  98%  96%  Weight:   75.3 kg   Height:        Data Reviewed: Prior data reviewed  Family Communication: Talked with daughter on phone.  Time spent: 45 minutes.  This record has been created using Systems analyst. Errors have been sought and corrected,but may not always be located. Such creation errors do not reflect on the standard of care.   Author: Lorella Nimrod, MD 05/30/2022 8:31 AM  For on call review [email or web address].

## 2022-05-30 NOTE — Progress Notes (Signed)
   05/30/22 1755  Assess: MEWS Score  Temp (!) 97.5 F (36.4 C)  BP (!) 152/84  MAP (mmHg) 102  Pulse Rate (!) 102  ECG Heart Rate (!) 102  Resp (!) 24  Level of Consciousness Alert  SpO2 100 %  O2 Device Room Air  Assess: MEWS Score  MEWS Temp 0  MEWS Systolic 0  MEWS Pulse 1  MEWS RR 1  MEWS LOC 0  MEWS Score 2  MEWS Score Color Yellow  Assess: if the MEWS score is Yellow or Red  Were vital signs taken at a resting state? Yes  Focused Assessment Change from prior assessment (see assessment flowsheet)  Does the patient meet 2 or more of the SIRS criteria? Yes  Does the patient have a confirmed or suspected source of infection? Yes  Provider and Rapid Response Notified? No  MEWS guidelines implemented *See Row Information* Yes  Take Vital Signs  Increase Vital Sign Frequency  Yellow: Q 2hr X 2 then Q 4hr X 2, if remains yellow, continue Q 4hrs  Escalate  MEWS: Escalate Yellow: discuss with charge nurse/RN and consider discussing with provider and RRT  Notify: Charge Nurse/RN  Name of Charge Nurse/RN Notified Abby RN  Date Charge Nurse/RN Notified 05/30/22  Time Charge Nurse/RN Notified 1804  Assess: SIRS CRITERIA  SIRS Temperature  0  SIRS Pulse 1  SIRS Respirations  1  SIRS WBC 1  SIRS Score Sum  3

## 2022-05-31 ENCOUNTER — Inpatient Hospital Stay (HOSPITAL_COMMUNITY): Payer: Medicare (Managed Care)

## 2022-05-31 DIAGNOSIS — R933 Abnormal findings on diagnostic imaging of other parts of digestive tract: Secondary | ICD-10-CM | POA: Diagnosis not present

## 2022-05-31 LAB — GLUCOSE, CAPILLARY
Glucose-Capillary: 142 mg/dL — ABNORMAL HIGH (ref 70–99)
Glucose-Capillary: 143 mg/dL — ABNORMAL HIGH (ref 70–99)
Glucose-Capillary: 152 mg/dL — ABNORMAL HIGH (ref 70–99)
Glucose-Capillary: 169 mg/dL — ABNORMAL HIGH (ref 70–99)

## 2022-05-31 MED ORDER — BISACODYL 10 MG RE SUPP
10.0000 mg | Freq: Every day | RECTAL | Status: DC
  Administered 2022-05-31 – 2022-06-03 (×4): 10 mg via RECTAL
  Filled 2022-05-31 (×5): qty 1

## 2022-05-31 MED ORDER — METOCLOPRAMIDE HCL 5 MG/ML IJ SOLN
10.0000 mg | Freq: Four times a day (QID) | INTRAMUSCULAR | Status: DC
  Administered 2022-05-31 – 2022-06-05 (×20): 10 mg via INTRAVENOUS
  Filled 2022-05-31 (×21): qty 2

## 2022-05-31 MED ORDER — CARVEDILOL 25 MG PO TABS
25.0000 mg | ORAL_TABLET | Freq: Two times a day (BID) | ORAL | Status: DC
  Administered 2022-05-31 – 2022-06-04 (×8): 25 mg via ORAL
  Filled 2022-05-31 (×8): qty 1

## 2022-05-31 MED ORDER — LOSARTAN POTASSIUM 25 MG PO TABS
25.0000 mg | ORAL_TABLET | Freq: Every day | ORAL | Status: DC
  Administered 2022-05-31 – 2022-06-04 (×5): 25 mg via ORAL
  Filled 2022-05-31 (×5): qty 1

## 2022-05-31 MED ORDER — METOCLOPRAMIDE HCL 5 MG/ML IJ SOLN: 10.0000 mg | Freq: Three times a day (TID) | INTRAMUSCULAR | Status: DC

## 2022-05-31 NOTE — Progress Notes (Signed)
Consultation Progress Note   Patient: Antonio Guerra [id number]:[id number] DOB: [birth year] DOA: 05/19/2022 DOS: the patient was seen and examined on 05/31/2022 Primary service: Lorella Nimrod, MD  Brief hospital course: Patient is a 71 year old male with past medical history of Merkel Cell carcinoma, erosive esophagitis, CKD stage IIIb, diabetes type 2, chronic systolic heart failure, coronary artery disease presented to hospital from home with complaints of nausea vomiting and diarrhea.  Of note, he was recently admitted hospital for the same a week ago and at that time had EGD done which showed severe esophagitis.  He was discharged home on Carafate and Protonix but despite that his nausea and vomiting worsened after he was given chemotherapy infusion.  Patient was then admitted to hospital again for further management.   At this time, patient has been having persistent ileus.  GI is closely following.  Physical therapy has recommended skilled nursing facility on discharge.   11/11: Patient remained stable, no more nausea or vomiting.  Having bowel movements.  Appetite improving.  PT is recommending SNF, hopefully can go on Monday.  11/12: Mild tachycardia with elevated blood pressure this morning, increasing the dose of carvedilol to 25 mg twice daily, also adding low-dose losartan. Repeat abdominal x-ray with persistent of dilated bowel, more consistent with ileus. GI added Dulcolax suppositories with current bowel regimen. They might do off label trial of pyridostigmine after talking with patient and family If remains stable will be discharged tomorrow to rehab.  Assessment and Plan: Principal Problem:   Intractable nausea and vomiting Active Problems:   Esophagitis, [place] grade D   Hyperlipidemia   Chronic systolic congestive heart failure (HCC)   Type 2 diabetes mellitus (HCC)   Stage 3a chronic kidney disease (HCC)   Merkel cell carcinoma (HCC)   Acute kidney injury superimposed on  chronic kidney disease (HCC)   Ileus (HCC)   Normocytic anemia   Abnormal CT scan, gastrointestinal tract   Intractable nausea/vomiting/Ogilvie's syndrome:  Patient was recently admitted a week back for the same.  Was on Carafate and Protonix for severe esophagitis from previous admission.  Abdominal x-ray at this time showed dilated distal small bowel colon with stool burden.  GI was consulted and patient received NG tube n.p.o.  He self removed the NG tube on 05/20/2022 and had a large bowel movement.  He subsequently had a distended abdomen 11/ 4/23 and was transferred to stepdown unit and was given neostigmine with multiple liquid bowel movements.  Abdomen again was distended and x-ray showed persistent ileus.  GI closely following.  CT scan of the abdomen pelvis done 05/26/2022 showed multiple mildly dilated loops of small bowel with no focal point suggestive of ileus with some thickening of the rectosigmoid colon.  Repeat imaging today with similar findings. GI is following and will continue with conservative management.  Overall improving. -Tolerating soft diet -Continue with bowel regimen -GI added Dulcolax suppository along with the current regimen -They might try off label pyridostigmine if repeat imaging tomorrow continue to have dilated bowel.  Severe esophagitis:  Recent EGD diagnosis.  Continue PPI twice daily  No need to repeat EGD   AKI on CKD stage IIIa: Baseline creatinine ranges from 1.3-1.8 .currently creatinine at 1.06 and at baseline.   Merkel cell carcinoma:  Patient has firm mass in the right groin.  Follows up with oncology.  Oncology had recommended palliative care.  Family not interested in hospice yet.  Patient has poor prognosis.     Diabetes mellitus type 2:  Continue sliding scale insulin Accu-Cheks.  Latest POC glucose of 137   Hyperlipidemia: Lipitor on hold.   Normocytic anemia: Latest hemoglobin of 9.5.  Has remained stable   Hypokalemia: Resolved.    Chronic systolic congestive heart failure: Last 2D echocardiogram on 9/23 showed EF of 30 to 35%.   -Increasing the dose of carvedilol to 25 mg twice daily -Continue  Imdur.   - Continue intake and output charting, daily weights.    Might need oral diuretics on discharge.   Hypertension: Mildly elevated blood pressure -Continue Imdur. -Increasing the dose of carvedilol' -Add low-dose losartan   Deconditioning/debility:  Physical therapy has recommended skilled nursing facility placement.     DVT prophylaxis:SCDs Start: 05/19/22 1730  Subjective: Patient was seen and examined today.  Denies any complaints.  No nausea, vomiting or abdominal pain.  Did had a bowel movement yesterday.  Physical Exam: General.chronically ill-appearing gentleman, in no acute distress. Pulmonary.  Lungs clear bilaterally, normal respiratory effort. CV.  Regular rate and rhythm, no JVD, rub or murmur. Abdomen.  Soft, nontender, nondistended, BS positive. CNS.  Alert and oriented .  No focal neurologic deficit. Extremities.  No edema, no cyanosis, pulses intact and symmetrical. Psychiatry.  Judgment and insight appears normal.   Vitals:   05/30/22 1755 05/30/22 2120 05/31/22 0533 05/31/22 0700  BP: (!) 152/84 (!) 149/88 (!) 161/87   Pulse: (!) 102     Resp: (!) 24 20 (!) 24 18  Temp: (!) 97.5 F (36.4 C) 98.6 F (37 C) 98.2 F (36.8 C)   TempSrc: Oral Oral Oral   SpO2: 100% 94% 97%   Weight:   72.5 kg   Height:        Data Reviewed: Prior data reviewed  Family Communication: Called daughter with no response.  Time spent: 44 minutes.  This record has been created using Systems analyst. Errors have been sought and corrected,but may not always be located. Such creation errors do not reflect on the standard of care.   Author: Lorella Nimrod, MD 05/31/2022 8:36 AM  For on call review [email or web address].

## 2022-05-31 NOTE — Progress Notes (Addendum)
[facility] Progress Note  CC:  Abdominal pain  Assessment / Plan: 71 year old male with Merkel cell carcinoma, treated with Keytruda, admitted with several weeks of abdominal pain, nausea, vomiting, PO intolerance and weight loss.  Previous imaging had shown extensive stool burden throughout with mild colonic and small bowel dilation suggestive of constipation with mild ileus. Flexible sigmoidoscopy 05/13/22 showed no rectal wall abnormalities.  EGD 05/13/22 showed severe esophagitis, likely from repetitive emesis. Imaging changes raised suspicion for Ogilvie's and he was treated with neostigmine. However, he has continued to have abdominal pain, variable stool output and poor appetite. CT 05/26/22 consistent with ileus and confirmed again on abdominal x-rays yesterday. I have wondered if there could be Merkel cell involvement in his bowel.   He has a normocytic anemia. Hemoglobin has been stable without any overt bleeding.   Although he was doing better over the last 24-48 hours, he has more air on his abdominal exam today.   Recommendations:  - Continue diet as tolerated. I am worried about his overall caloric intake.  - Monitor and correct electrolyte abnormalities. - Avoid narcotics and anticholinergic drugs. - Encourage mobilization. - Continue aggressive bowel regimen including Miralax 17 g BID.   - Continue Reglan 5 mg IV TID, consider increasing to 10 mg TID today - Follow serial abdominal films. X-ray results from this morning are still pending.   - Continue PPI BID for diagnosis of esophagitis.  - No endoscopic evaluation indicated at this time.    ADDENDUM: Abdominal x-rays show persistent if not worsened ileus. Will increase Reglan to 10 mg QID and add Dulcolax suppositories in addition to measures above. Repeat abdominal films tomorrow. If no improvement after at least 24 hours on the higher dose of Reglan, would consider an off-label trial of pyridostigmine  if the patient and family are in agreement with the plan.     Subjective: Feeling better today. Still having abdominal pain but it is overall improved. Some increased flatus and stool output over the last 24 hours. No food tray at the bedside but he says he is eating more. He has an open can of nutritional supplement at the bedside. No family present at the time of my evaluation.   Objective:  Vital signs in last 24 hours: Temp:  [97.5 F (36.4 C)-98.6 F (37 C)] 98.2 F (36.8 C) (11/12 0533) Pulse Rate:  [102-104] 102 (11/11 1755) Resp:  [12-24] 18 (11/12 0700) BP: (108-161)/(73-88) 161/87 (11/12 0533) SpO2:  [94 %-100 %] 97 % (11/12 0533) Weight:  [72.5 kg] 72.5 kg (11/12 0533) Last BM Date : 05/29/22 General:   Alert, in NAD, chronically ill appearing Heart:  Regular rate and rhythm; no murmurs Pulm: Clear anteriorly; no wheezing Abdomen:  Soft. Slightly distended today. No pain with palpation. Very hypoactive bowel sounds. No rebound or guarding. LAD: No inguinal or umbilical LAD Extremities:  Without edema. Neurologic:  Alert and  oriented x4;  grossly normal neurologically. Psych:  Alert and cooperative. Normal mood and affect.  Lab Results: Recent Labs    05/29/22 0739  WBC 4.2  HGB 9.7*  HCT 29.2*  PLT 286   BMET Recent Labs    05/29/22 0739  NA 135  K 4.2  CL 106  CO2 25  GLUCOSE 196*  BUN 7*  CREATININE 1.06  CALCIUM 9.2   DG Abd 1 View  Result Date: 05/29/2022 CLINICAL DATA:  Nausea and vomiting EXAM: ABDOMEN - 1 VIEW COMPARISON:  Abdominal  radiograph dated 05/24/2022 FINDINGS: Nonobstructive bowel gas pattern. Continued gaseous distention small and large bowel loops to the level of the rectum. No abnormal radio-opaque calculi or mass effect. No acute or substantial osseous abnormality. The sacrum and coccyx are partially obscured by overlying bowel contents. IMPRESSION: Continued gaseous distention of small and large bowel loops to the level of the  rectum, likely ileus. Electronically Signed   By: Darrin Nipper M.D.   On: 05/29/2022 14:52       LOS: 12 days   [place]  05/31/2022, 9:06 AM

## 2022-06-01 ENCOUNTER — Inpatient Hospital Stay (HOSPITAL_COMMUNITY): Payer: Medicare (Managed Care)

## 2022-06-01 ENCOUNTER — Ambulatory Visit: Payer: Medicare (Managed Care)

## 2022-06-01 DIAGNOSIS — R112 Nausea with vomiting, unspecified: Secondary | ICD-10-CM | POA: Diagnosis not present

## 2022-06-01 DIAGNOSIS — K9189 Other postprocedural complications and disorders of digestive system: Secondary | ICD-10-CM

## 2022-06-01 DIAGNOSIS — K567 Ileus, unspecified: Secondary | ICD-10-CM | POA: Diagnosis not present

## 2022-06-01 LAB — GLUCOSE, CAPILLARY
Glucose-Capillary: 141 mg/dL — ABNORMAL HIGH (ref 70–99)
Glucose-Capillary: 151 mg/dL — ABNORMAL HIGH (ref 70–99)
Glucose-Capillary: 145 mg/dL — ABNORMAL HIGH (ref 70–99)
Glucose-Capillary: 172 mg/dL — ABNORMAL HIGH (ref 70–99)
Glucose-Capillary: 157 mg/dL — ABNORMAL HIGH (ref 70–99)

## 2022-06-01 MED ORDER — BISACODYL 10 MG RE SUPP: 10.0000 mg | Freq: Every day | RECTAL | Status: DC | PRN

## 2022-06-01 MED ORDER — BOOST / RESOURCE BREEZE PO LIQD CUSTOM
1.0000 | Freq: Three times a day (TID) | ORAL | Status: DC
  Administered 2022-06-01 – 2022-06-05 (×6): 1 via ORAL

## 2022-06-01 MED ORDER — MAGNESIUM HYDROXIDE 400 MG/5ML PO SUSP
30.0000 mL | Freq: Every day | ORAL | Status: DC
  Administered 2022-06-01 – 2022-06-05 (×5): 30 mL via ORAL
  Filled 2022-06-01 (×5): qty 30

## 2022-06-01 NOTE — Plan of Care (Signed)
  Problem: Education: Goal: Knowledge of General Education information will improve Description: Including pain rating scale, medication(s)/side effects and non-pharmacologic comfort measures Outcome: Not Progressing   Problem: Health Behavior/Discharge Planning: Goal: Ability to manage health-related needs will improve Outcome: Not Progressing   

## 2022-06-01 NOTE — Progress Notes (Addendum)
Patient ID: Antonio Guerra, male   DOB: 1951/06/20, 71 y.o.   MRN: [id number]    Progress Note   Subjective   Day # 13 CC; abdominal pain, Merkel cell carcinoma/right inguinal mass and right pelvic sidewall involvement-persistent ileus  KUB today-gaseous distention of the colon, decreased small bowel distention compared to prior.  Patient appears more frail and weaker than when last seen by myself a few weeks ago, continues to complain of generalized abdominal pain, has been hiccuping, denies vomiting, no bowel movement today believes he did have some stool yesterday   Objective   Vital signs in last 24 hours: Temp:  [98 F (36.7 C)-98.5 F (36.9 C)] 98.2 F (36.8 C) (11/13 0404) Pulse Rate:  [96-106] 96 (11/13 1051) Resp:  [16-24] 16 (11/13 0404) BP: (116-161)/(78-103) 116/84 (11/13 1051) SpO2:  [94 %-99 %] 94 % (11/13 0404) Weight:  [73.8 kg] 73.8 kg (11/13 0419) Last BM Date : 05/29/22 General:    Elderly African-American male in NAD Heart:  Regular rate and rhythm; no murmurs Lungs: Respirations even and unlabored, lungs CTA bilaterally Abdomen:  Soft, no significant distention, bowel sounds are quiet, he is tender across the mid and lower abdomen more so in the right lower quadrant, large right groin mass Extremities:  Without edema. Neurologic:  Alert and oriented,  grossly normal neurologically. Psych:  Cooperative. Normal mood and affect.  Intake/Output from previous day: 11/12 0701 - 11/13 0700 In: -  Out: 300 [Urine:300] Intake/Output this shift: No intake/output data recorded.  Lab Results: No results for input(s): "WBC", "HGB", "HCT", "PLT" in the last 72 hours. BMET No results for input(s): "NA", "K", "CL", "CO2", "GLUCOSE", "BUN", "CREATININE", "CALCIUM" in the last 72 hours. LFT No results for input(s): "PROT", "ALBUMIN", "AST", "ALT", "ALKPHOS", "BILITOT", "BILIDIR", "IBILI" in the last 72 hours. PT/INR No results for input(s): "LABPROT", "INR" in the  last 72 hours.  Studies/Results: DG Abd 2 Views  Result Date: 06/01/2022 CLINICAL DATA:  Nausea and vomiting EXAM: ABDOMEN - 2 VIEW COMPARISON:  05/31/2022 FINDINGS: Gaseous distension of the colon. Decreased small-bowel distension compared to prior. No free intraperitoneal air is evident. Atherosclerotic vascular calcifications. IMPRESSION: Gaseous distension of the colon. Decreased small-bowel distension compared to prior. Electronically Signed   By: Davina Poke D.O.   On: 06/01/2022 10:16   DG Abd 1 View  Result Date: 05/31/2022 CLINICAL DATA:  Ileus. EXAM: ABDOMEN - 1 VIEW COMPARISON:  05/29/2022 FINDINGS: Continued dilated large and small bowel. There is some formed stool in the right colon as before. Small bowel loops measure up to 3.6 cm in diameter. There is some increasing gas in small bowel loops in the right lower quadrant compared to previous. Atherosclerosis noted. No conventional radiographic findings of free intraperitoneal gas. IMPRESSION: 1. Continued dilated large and small bowel with some increasing gas in small bowel loops in the right lower quadrant. Appearance favors ileus over obstruction. 2. Atherosclerosis. Electronically Signed   By: Van Clines M.D.   On: 05/31/2022 09:46       Assessment / Plan:    #31 71 year old white male with Merkel cell carcinoma involving the right groin and right pelvic sidewall, with evidence of progression on the last PET imaging. Patient had admission last month with abdominal pain and ileus, eventually discharged and readmitted on 05/19/2022 with recurrent nausea, vomiting, abdominal pain and weight loss. He received neostigmine on 05/23/2022 resulting in multiple bowel movements, however since then he has had persistent evidence of ileus. Not making any  significant improvement with IV metoclopramide, twice daily MiraLAX, Dulcolax etc.  Could consider second dose of neostigmine-however did not offer him a lasting result after  last dose as symptoms just recurred.  Concerned that he has bowel involvement with the Merkel cell carcinoma, may have element of carcinomatosis contributing to his persistent symptoms   #2 malnutrition secondary to above-have restarted boost 3 times daily #3 anemia-iron deficient #4 history of severe grade D esophagitis at EGD 05/13/2022-continue twice daily PPI #5 chronic congestive heart failure EF 30 to 35% #6 adult onset diabetes mellitus  Plan:  Continue current regimen And tapwater enemas x2 this afternoon I think oncology input is needed again this admission-probably needs further imaging, consider MRI versus PET scan however understandingly usually cannot get PET scans as an inpatient    LOS: 13 days   Amy EsterwoodPA-C  06/01/2022, 11:46 AM   Attending Physician Note   I have taken an interval history, reviewed the chart and examined the patient. I performed a substantive portion of this encounter, including complete performance of at least one of the key components, in conjunction with the APP. I agree with the APP's note, impression and recommendations with my edits. My additional impressions and recommendations are as follows.   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 Continue current mgmt. Suggest Oncology input.   Lucio Edward, MD [facility] See [facility], [facility], for our on call provider

## 2022-06-01 NOTE — Progress Notes (Signed)
Physical Therapy Treatment Patient Details Name: Antonio Guerra MRN: [id number] DOB: Dec 31, 1950 Today's Date: 06/01/2022   History of Present Illness 71 year old male with history of Merkel Cell carcinoma, erosive esophagitis, CKD stage IIIb, diabetes type 2, chronic systolic heart failure, coronary artery disease who presented from home with complaint of diarrhea with nausea, vomiting.  Recent admission last week for same symptoms.  Pt was having and still has pain in Right groin/leg (enlarged lymph node last admission, denies any falls).    PT Comments    Pt assisted with ambulating and able to tolerate 30'x2 today with seated rest break.  Pt reports generalized weakness and fatigue limiting mobility.  Continue to recommend SNF upon d/c.    Recommendations for follow up therapy are one component of a multi-disciplinary discharge planning process, led by the attending physician.  Recommendations may be updated based on patient status, additional functional criteria and insurance authorization.  Follow Up Recommendations  Skilled nursing-short term rehab (<3 hours/day) Can patient physically be transported by private vehicle: Yes   Assistance Recommended at Discharge Frequent or constant Supervision/Assistance  Patient can return home with the following Assistance with cooking/housework;Assist for transportation;Help with stairs or ramp for entrance;A little help with bathing/dressing/bathroom;A little help with walking and/or transfers   Equipment Recommendations  None recommended by PT    Recommendations for Other Services       Precautions / Restrictions Precautions Precautions: Fall     Mobility  Bed Mobility Overal bed mobility: Needs Assistance Bed Mobility: Supine to Sit     Supine to sit: Min assist     General bed mobility comments: increased time and effort; assist for trunk upright    Transfers Overall transfer level: Needs assistance Equipment used: Rolling  walker (2 wheels) Transfers: Sit to/from Stand Sit to Stand: Min assist, +2 safety/equipment           General transfer comment: verbal cues for hand placement, weight shifting; assist to rise, steady and control descent    Ambulation/Gait Ambulation/Gait assistance: Min assist, +2 safety/equipment Gait Distance (Feet): 60 Feet Assistive device: Rolling walker (2 wheels) Gait Pattern/deviations: Decreased stride length, Step-to pattern, Narrow base of support, Decreased dorsiflexion - left, Decreased dorsiflexion - right Gait velocity: decreased     General Gait Details: 30'x2 with seated rest break, assist for LOB x2, cues for heel strike or increasing hip/knee flexion especially for L ankle/foot (ambulated with his shoes on)   Stairs             Wheelchair Mobility    Modified Rankin (Stroke Patients Only)       Balance Overall balance assessment: Needs assistance         Standing balance support: Bilateral upper extremity supported, During functional activity, Reliant on assistive device for balance Standing balance-Leahy Scale: Poor                              Cognition Arousal/Alertness: Awake/alert Behavior During Therapy: Flat affect Overall Cognitive Status: No family/caregiver present to determine baseline cognitive functioning                                 General Comments: Pt pleasant, able to follow commands, minimally conversational        Exercises      General Comments        Pertinent Vitals/Pain Pain Assessment Pain Assessment:  Faces Faces Pain Scale: Hurts even more Pain Location: sacral area (RN reports he has pressure injury) Pain Descriptors / Indicators: Sore, Grimacing Pain Intervention(s): Repositioned, Monitored during session (placed pillow on one side to unload, requested cushion for chair)    Home Living                          Prior Function            PT Goals (current  goals can now be found in the care plan section) Progress towards PT goals: Progressing toward goals    Frequency    Min 2X/week      PT Plan Current plan remains appropriate    Co-evaluation              AM-PAC PT "6 Clicks" Mobility   Outcome Measure  Help needed turning from your back to your side while in a flat bed without using bedrails?: A Little Help needed moving from lying on your back to sitting on the side of a flat bed without using bedrails?: A Little Help needed moving to and from a bed to a chair (including a wheelchair)?: A Lot Help needed standing up from a chair using your arms (e.g., wheelchair or bedside chair)?: A Lot Help needed to walk in hospital room?: A Lot Help needed climbing 3-5 steps with a railing? : Total 6 Click Score: 13    End of Session Equipment Utilized During Treatment: Gait belt Activity Tolerance: Patient tolerated treatment well Patient left: in chair;with call bell/phone within reach;with chair alarm set Nurse Communication: Mobility status PT Visit Diagnosis: Other abnormalities of gait and mobility (R26.89);Muscle weakness (generalized) (M62.81)     Time: 7342-8768 PT Time Calculation (min) (ACUTE ONLY): 23 min  Charges:  $Gait Training: 23-37 mins                    Jannette Spanner PT, DPT Physical Therapist Acute Rehabilitation Services Preferred contact method: Secure Chat Weekend Pager Only: [phone] Office: [place] 06/01/2022, 3:09 PM

## 2022-06-01 NOTE — Progress Notes (Signed)
Consultation Progress Note   Patient: Antonio Guerra WUJ:[id number] DOB: 1950-08-07 DOA: 05/19/2022 DOS: the patient was seen and examined on 06/01/2022 Primary service: Lorella Nimrod, MD  Brief hospital course: Patient is a 71 year old male with past medical history of Merkel Cell carcinoma, erosive esophagitis, CKD stage IIIb, diabetes type 2, chronic systolic heart failure, coronary artery disease presented to hospital from home with complaints of nausea vomiting and diarrhea.  Of note, he was recently admitted hospital for the same a week ago and at that time had EGD done which showed severe esophagitis.  He was discharged home on Carafate and Protonix but despite that his nausea and vomiting worsened after he was given chemotherapy infusion.  Patient was then admitted to hospital again for further management.   At this time, patient has been having persistent ileus.  GI is closely following.  Physical therapy has recommended skilled nursing facility on discharge.   11/11: Patient remained stable, no more nausea or vomiting.  Having bowel movements.  Appetite improving.  PT is recommending SNF, hopefully can go on Monday.  11/12: Mild tachycardia with elevated blood pressure this morning, increasing the dose of carvedilol to 25 mg twice daily, also adding low-dose losartan. Repeat abdominal x-ray with persistent of dilated bowel, more consistent with ileus. GI added Dulcolax suppositories with current bowel regimen. They might do off label trial of pyridostigmine after talking with patient and family If remains stable will be discharged tomorrow to rehab.  11/13: Patient more lethargic and worsening abdominal pain this morning.  Repeat abdominal x-ray with dilated large bowel.  Did not had any bowel movement, bowel regimen was escalated.  Also concern of carcinomatosis due to his history of Merkel cell carcinoma-message sent to oncology for their input.  Assessment and Plan: Principal  Problem:   Intractable nausea and vomiting Active Problems:   Esophagitis, [place] grade D   Hyperlipidemia   Chronic systolic congestive heart failure (HCC)   Type 2 diabetes mellitus (HCC)   Stage 3a chronic kidney disease (HCC)   Merkel cell carcinoma (HCC)   Acute kidney injury superimposed on chronic kidney disease (HCC)   Ileus (HCC)   Normocytic anemia   Abnormal CT scan, gastrointestinal tract   Intractable nausea/vomiting/Ogilvie's syndrome:  Patient was recently admitted a week back for the same.  Was on Carafate and Protonix for severe esophagitis from previous admission.  Abdominal x-ray at this time showed dilated distal small bowel colon with stool burden.  GI was consulted and patient received NG tube n.p.o.  He self removed the NG tube on 05/20/2022 and had a large bowel movement.  He subsequently had a distended abdomen 11/ 4/23 and was transferred to stepdown unit and was given neostigmine with multiple liquid bowel movements.  Abdomen again was distended and x-ray showed persistent ileus.  GI closely following.  CT scan of the abdomen pelvis done 05/26/2022 showed multiple mildly dilated loops of small bowel with no focal point suggestive of ileus with some thickening of the rectosigmoid colon.  Repeat imaging today with similar findings. GI is following and will continue with conservative management.  Overall improving. -Tolerating soft diet -Continue with bowel regimen -GI added Dulcolax suppository along with the current regimen -They might try off label pyridostigmine if repeat imaging tomorrow continue to have dilated bowel. -Also message sent to oncology for concern of carcinomatosis. -Added some enema to the current bowel regimen.  Severe esophagitis:  Recent EGD diagnosis.  Continue PPI twice daily  No need to  repeat EGD   AKI on CKD stage IIIa: Baseline creatinine ranges from 1.3-1.8 .currently creatinine at 1.06 and at baseline.   Merkel cell carcinoma:   Patient has firm mass in the right groin.  Follows up with oncology.  Oncology had recommended palliative care.  Family not interested in hospice yet.  Patient has poor prognosis.     Diabetes mellitus type 2:   Continue sliding scale insulin Accu-Cheks.  Latest POC glucose of 137   Hyperlipidemia: Lipitor on hold.   Normocytic anemia: Latest hemoglobin of 9.5.  Has remained stable   Hypokalemia: Resolved.   Chronic systolic congestive heart failure: Last 2D echocardiogram on 9/23 showed EF of 30 to 35%.   -Increasing the dose of carvedilol to 25 mg twice daily -Continue  Imdur.   - Continue intake and output charting, daily weights.    Might need oral diuretics on discharge.   Hypertension: Mildly elevated blood pressure -Continue Imdur. -Increasing the dose of carvedilol' -Add low-dose losartan   Deconditioning/debility:  Physical therapy has recommended skilled nursing facility placement.     DVT prophylaxis:SCDs Start: 05/19/22 1730  Subjective: Patient appears more lethargic and having more abdominal pain when seen today.  No nausea or vomiting.  Physical Exam: General.chronically ill-appearing gentleman, in no acute distress. Pulmonary.  Lungs clear bilaterally, normal respiratory effort. CV.  Regular rate and rhythm, no JVD, rub or murmur. Abdomen.  Soft, nontender, nondistended, BS positive. CNS.  Alert and oriented .  No focal neurologic deficit. Extremities.  No edema, no cyanosis, pulses intact and symmetrical. Psychiatry.  Judgment and insight appears normal. .   Vitals:   06/01/22 0404 06/01/22 0419 06/01/22 1051 06/01/22 1336  BP: 125/78  116/84 (!) 97/55  Pulse: (!) 106  96 100  Resp: 16   18  Temp: 98.2 F (36.8 C)   98.9 F (37.2 C)  TempSrc: Oral   Oral  SpO2: 94%   96%  Weight:  73.8 kg    Height:        Data Reviewed: Prior data reviewed  Family Communication: Talked with daughter on phone.  Time spent: 42 minutes.  This record has been  created using Systems analyst. Errors have been sought and corrected,but may not always be located. Such creation errors do not reflect on the standard of care.   Author: Lorella Nimrod, MD 06/01/2022 3:18 PM  For on call review [email or web address].

## 2022-06-01 NOTE — TOC Progression Note (Signed)
Transition of Care [facility]) - Progression Note    Patient Details  Name: Antonio Guerra MRN: [id number] Date of Birth: September 06, 1950  Transition of Care [facility] CM/SW Contact  Purcell Mouton, RN Phone Number: 06/01/2022, 2:37 PM  Clinical Narrative:     A call was made to [facility], who agreed with pt  admitting in the AM. A call was made to pt's CS at [facility], left VM. Pt's daughter [name] was also called concerning pt's discharge to [facility]. [name] agreed. MD is aware.    Expected Discharge Plan: Home/Self Care Barriers to Discharge: Continued Medical Work up  Expected Discharge Plan and Services Expected Discharge Plan: Home/Self Care   Discharge Planning Services: CM Consult   Living arrangements for the past 2 months: Apartment                                       Social Determinants of Health (SDOH) Interventions    Readmission Risk Interventions    05/20/2022    9:42 AM  Readmission Risk Prevention Plan  Transportation Screening Complete  Medication Review ([place]) Complete  PCP or Specialist appointment within 3-5 days of discharge Complete  HRI or [place] Not Applicable

## 2022-06-02 DIAGNOSIS — K9189 Other postprocedural complications and disorders of digestive system: Secondary | ICD-10-CM | POA: Diagnosis not present

## 2022-06-02 DIAGNOSIS — R112 Nausea with vomiting, unspecified: Secondary | ICD-10-CM | POA: Diagnosis not present

## 2022-06-02 DIAGNOSIS — K567 Ileus, unspecified: Secondary | ICD-10-CM | POA: Diagnosis not present

## 2022-06-02 LAB — GLUCOSE, CAPILLARY
Glucose-Capillary: 154 mg/dL — ABNORMAL HIGH (ref 70–99)
Glucose-Capillary: 160 mg/dL — ABNORMAL HIGH (ref 70–99)
Glucose-Capillary: 162 mg/dL — ABNORMAL HIGH (ref 70–99)
Glucose-Capillary: 199 mg/dL — ABNORMAL HIGH (ref 70–99)
Glucose-Capillary: 240 mg/dL — ABNORMAL HIGH (ref 70–99)

## 2022-06-02 NOTE — Progress Notes (Addendum)
Patient ID: Antonio Guerra, male   DOB: February 11, 1951, 71 y.o.   MRN: [id number]    Progress Note   Subjective  Day # 13 CC: persistent abdominal pain, persistent ileus, Merkel cell carcinoma involving the right inguinal area and right pelvis  No new labs today  Patient currently working with therapy getting up to the chair, has been able to ambulate short distances with assistance There is no documentation of the tapwater enemas in his chart that I can see but patient says that he did have bowel movements with the enemas yesterday and abdominal pain is somewhat less today  Nursing  staff reports he has been eating about half of his meals and does drink his supplements     Objective   Vital signs in last 24 hours: Temp:  [97.4 F (36.3 C)-98.9 F (37.2 C)] 98.8 F (37.1 C) (11/14 0551) Pulse Rate:  [95-100] 100 (11/14 0551) Resp:  [16-18] 16 (11/14 0551) BP: (97-135)/(55-84) 126/76 (11/14 0551) SpO2:  [96 %-100 %] 97 % (11/14 0551) Weight:  [76.9 kg] 76.9 kg (11/14 0500) Last BM Date : 06/01/22 General: Elderly African-American male in NAD Heart:  Regular rate and rhythm; no murmurs Lungs: Respirations even and unlabored, lungs CTA bilaterally Abdomen:  Soft, no significant distention, bowel sounds are present he has tenderness in the right lower quadrant and right mid quadrant where there is fullness . Normal bowel sounds. Extremities:  Without edema. Neurologic:  Alert and oriented,  grossly normal neurologically. Psych:  Cooperative. Normal mood and affect.  Intake/Output from previous day: 11/13 0701 - 11/14 0700 In: -  Out: 250 [Urine:250] Intake/Output this shift: No intake/output data recorded.  Studies/Results: DG Abd 2 Views  Result Date: 06/01/2022 CLINICAL DATA:  Nausea and vomiting EXAM: ABDOMEN - 2 VIEW COMPARISON:  05/31/2022 FINDINGS: Gaseous distension of the colon. Decreased small-bowel distension compared to prior. No free intraperitoneal air is evident.  Atherosclerotic vascular calcifications. IMPRESSION: Gaseous distension of the colon. Decreased small-bowel distension compared to prior. Electronically Signed   By: Davina Poke D.O.   On: 06/01/2022 10:16       Assessment / Plan:    #[birth year] 71 year old white male with Merkel cell carcinoma involving the right groin and right pelvic sidewall with evidence of progression at the time of last pet imaging October 2023 despite Beryle Flock He has now had 2 prolonged hospitalizations with abdominal pain and ileus type symptoms. Seems to be better today after tapwater enemas yesterday, he has been on a regimen of IV Reglan, twice daily MiraLAX and Dulcolax suppositories daily.  Neostigmine last week but symptoms recurred shortly after a brief improvement  I am concerned that he has either carcinomatosis, or a paraneoplastic type process affecting gut motility.  I am not sure that any of our interventions are going to offer any longer-term solution to this problem  He can continue with every other day enemas in addition to the above regimen We will check abdominal films in a.m.  Await Oncology input regarding any suggestions/plans moving forward, and whether repeat PET scan may be helpful If he develops severe ileus that we cannot manage, query whether diverting colostomy would be an option in setting of significant disease in his right pelvis  #2 Severe grade D esophagitis, continue twice daily PPI, Carafate and keep head of bed elevated 45 degrees  # 3 malnutrition-staff relates of eating about half of his meals and drinking supplements   LOS: 14 days   Amy Esterwood  PA-C11/14/2023, 9:02  AM    Attending Physician Note   I have taken an interval history, reviewed the chart and examined the patient. I performed a substantive portion of this encounter, including complete performance of at least one of the key components, in conjunction with the APP. I agree with the APP's note, impression and  recommendations with my edits. My additional impressions and recommendations are as follows.   Prolonged ileus improved today post tap water enemas. 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 Continue current regimen with Reglan, Miralax, Ducolax supp and enemas qod. Await oncology input.   Lucio Edward, MD [facility], [facility], for our on call provider

## 2022-06-02 NOTE — Progress Notes (Signed)
Mobility Specialist - Progress Note   06/02/22 1036  Mobility  Activity Transferred from bed to chair  Level of Assistance Moderate assist, patient does 50-74%  Assistive Device Front wheel walker  Distance Ambulated (ft) 5 ft  Activity Response Tolerated well  Mobility Referral No   Pt received in bed and agreeable to transfer to recline with the assistance of NT.  Pt to recliner after session with all needs met & NT in room.    [facility]

## 2022-06-02 NOTE — TOC Progression Note (Signed)
Transition of Care [facility]) - Progression Note    Patient Details  Name: Antonio Guerra MRN: [id number] Date of Birth: 1950/10/24  Transition of Care [facility]) CM/SW Contact  Purcell Mouton, RN Phone Number: 06/02/2022, 2:29 PM  Clinical Narrative:     Roque Lias, CSW from [facility] called for an update and to state that Pt is will now discharge to [place] when ready for discharge.  Expected Discharge Plan: Home/Self Care Barriers to Discharge: Continued Medical Work up  Expected Discharge Plan and Services Expected Discharge Plan: Home/Self Care   Discharge Planning Services: CM Consult   Living arrangements for the past 2 months: Apartment                                       Social Determinants of Health (SDOH) Interventions    Readmission Risk Interventions    05/20/2022    9:42 AM  Readmission Risk Prevention Plan  Transportation Screening Complete  Medication Review ([place]) Complete  PCP or Specialist appointment within 3-5 days of discharge Complete  HRI or [place] Not Applicable

## 2022-06-02 NOTE — Plan of Care (Signed)
  Problem: Education: Goal: Knowledge of General Education information will improve Description Including pain rating scale, medication(s)/side effects and non-pharmacologic comfort measures Outcome: Progressing   Problem: Health Behavior/Discharge Planning: Goal: Ability to manage health-related needs will improve Outcome: Progressing   

## 2022-06-02 NOTE — Progress Notes (Signed)
Consultation Progress Note   Patient: Antonio Guerra URK:[id number] DOB: 08/30/1950 DOA: 05/19/2022 DOS: the patient was seen and examined on 06/02/2022 Primary service: Lorella Nimrod, MD  Brief hospital course: Patient is a 71 year old male with past medical history of Merkel Cell carcinoma, erosive esophagitis, CKD stage IIIb, diabetes type 2, chronic systolic heart failure, coronary artery disease presented to hospital from home with complaints of nausea vomiting and diarrhea.  Of note, he was recently admitted hospital for the same a week ago and at that time had EGD done which showed severe esophagitis.  He was discharged home on Carafate and Protonix but despite that his nausea and vomiting worsened after he was given chemotherapy infusion.  Patient was then admitted to hospital again for further management.   At this time, patient has been having persistent ileus.  GI is closely following.  Physical therapy has recommended skilled nursing facility on discharge.   11/11: Patient remained stable, no more nausea or vomiting.  Having bowel movements.  Appetite improving.  PT is recommending SNF, hopefully can go on Monday.  11/12: Mild tachycardia with elevated blood pressure this morning, increasing the dose of carvedilol to 25 mg twice daily, also adding low-dose losartan. Repeat abdominal x-ray with persistent of dilated bowel, more consistent with ileus. GI added Dulcolax suppositories with current bowel regimen. They might do off label trial of pyridostigmine after talking with patient and family If remains stable will be discharged tomorrow to rehab.  11/13: Patient more lethargic and worsening abdominal pain this morning.  Repeat abdominal x-ray with dilated large bowel.  Did not had any bowel movement, bowel regimen was escalated.  Also concern of carcinomatosis due to his history of Merkel cell carcinoma-message sent to oncology for their input.  11/14: Patient continued to had 8/10  abdominal pain, no recorded bowel movement yet.  Had a long discussion with her oncologist Dr.Iruku and according to her patient has a very aggressive malignancy and is not a candidate for any surgical intervention, chemo and radiation is also becoming difficult due to physical debility and poor functional status.  She had multiple communications with him and daughter regarding proceeding with hospice but daughter is very resistant to this idea and would like to keep him full code with full scope of care.  Patient seems miserable and is appropriate for comfort care only to keep some quality of life.  I myself had another discussion with his daughter on phone, she was even getting upset that why he is getting some oxycodone with this bowel obstruction.  Tried explaining that he is experiencing a lot of pain, per daughter she does not want him to have pain but at the same time do not give any opioid or codeine related products. Seems like she does not have a good insight of his problem.  Per daughter she will come over there today and have a discussion with her father.  I offered palliative services with focus on comfort only to keep some quality of life.  She will discuss with her dad, but getting very upset with me and that when cardiologist cleared him for surgery why we cannot do any surgical procedure. Very difficult family dynamics which will only increase patient suffering at this time.  Assessment and Plan: Principal Problem:   Intractable nausea and vomiting Active Problems:   Esophagitis, [place] grade D   Hyperlipidemia   Chronic systolic congestive heart failure (HCC)   Type 2 diabetes mellitus (HCC)   Stage 3a chronic  kidney disease ([place])   Merkel cell carcinoma (HCC)   Acute kidney injury superimposed on chronic kidney disease (HCC)   Ileus (HCC)   Normocytic anemia   Abnormal CT scan, gastrointestinal tract   Intractable nausea/vomiting/Ogilvie's syndrome:  Patient was recently  admitted a week back for the same.  Was on Carafate and Protonix for severe esophagitis from previous admission.  Abdominal x-ray at this time showed dilated distal small bowel colon with stool burden.  GI was consulted and patient received NG tube n.p.o.  He self removed the NG tube on 05/20/2022 and had a large bowel movement.  He subsequently had a distended abdomen 11/ 4/23 and was transferred to stepdown unit and was given neostigmine with multiple liquid bowel movements.  Abdomen again was distended and x-ray showed persistent ileus.  GI closely following.  CT scan of the abdomen pelvis done 05/26/2022 showed multiple mildly dilated loops of small bowel with no focal point suggestive of ileus with some thickening of the rectosigmoid colon.  Repeat imaging today with similar findings. GI is following and will continue with conservative management.  Overall improving. -Tolerating soft diet -Continue with bowel regimen -GI added Dulcolax suppository along with the current regimen -They might try off label pyridostigmine if repeat imaging tomorrow continue to have dilated bowel. -Also message sent to oncology for concern of carcinomatosis. -Added some enema to the current bowel regimen.  Severe esophagitis:  Recent EGD diagnosis.  Continue PPI twice daily  No need to repeat EGD   AKI on CKD stage IIIa: Baseline creatinine ranges from 1.3-1.8 .currently creatinine at 1.06 and at baseline.   Merkel cell carcinoma:  Patient has firm mass in the right groin.  Follows up with oncology.  Oncology had recommended palliative care.  Family not interested in hospice yet.  Patient has poor prognosis.     Diabetes mellitus type 2:   Continue sliding scale insulin Accu-Cheks.  Latest POC glucose of 137   Hyperlipidemia: Lipitor on hold.   Normocytic anemia: Latest hemoglobin of 9.5.  Has remained stable   Hypokalemia: Resolved.   Chronic systolic congestive heart failure: Last 2D echocardiogram on  9/23 showed EF of 30 to 35%.   -Increasing the dose of carvedilol to 25 mg twice daily -Continue  Imdur.   - Continue intake and output charting, daily weights.    Might need oral diuretics on discharge.   Hypertension: Mildly elevated blood pressure -Continue Imdur. -Increasing the dose of carvedilol' -Add low-dose losartan   Deconditioning/debility:  Physical therapy has recommended skilled nursing facility placement.     DVT prophylaxis:SCDs Start: 05/19/22 1730  Subjective: Patient was getting ready to get out of bed with the help of therapy, continued to have abdominal pain, stating 8/10, no nausea or vomiting.  Per patient no bowel movement.  Physical Exam: General.chronically ill-appearing, malnourished elderly man, in no acute distress. Pulmonary.  Lungs clear bilaterally, normal respiratory effort. CV.  Regular rate and rhythm, no JVD, rub or murmur. Abdomen.  Soft, nontender, nondistended, BS positive. CNS.  Alert and oriented .  No focal neurologic deficit. Extremities.  No edema, no cyanosis, pulses intact and symmetrical. Psychiatry.  Appears to have some cognitive impairment  Vitals:   06/02/22 0500 06/02/22 0551 06/02/22 0919 06/02/22 1223  BP:  126/76 125/67 108/68  Pulse:  100 91 (!) 102  Resp:  16  18  Temp:  98.8 F (37.1 C)  97.7 F (36.5 C)  TempSrc:  Oral  Oral  SpO2:  97%  96%  Weight: 76.9 kg     Height:        Data Reviewed: Prior data reviewed  Family Communication: Talked with daughter on phone.  Time spent: 45 minutes.  This record has been created using Systems analyst. Errors have been sought and corrected,but may not always be located. Such creation errors do not reflect on the standard of care.   Author: Lorella Nimrod, MD 06/02/2022 2:59 PM  For on call review [email or web address].

## 2022-06-03 ENCOUNTER — Other Ambulatory Visit (HOSPITAL_COMMUNITY): Payer: Self-pay

## 2022-06-03 DIAGNOSIS — E860 Dehydration: Secondary | ICD-10-CM | POA: Diagnosis not present

## 2022-06-03 DIAGNOSIS — R112 Nausea with vomiting, unspecified: Secondary | ICD-10-CM | POA: Diagnosis not present

## 2022-06-03 DIAGNOSIS — R140 Abdominal distension (gaseous): Secondary | ICD-10-CM

## 2022-06-03 DIAGNOSIS — N179 Acute kidney failure, unspecified: Secondary | ICD-10-CM | POA: Diagnosis not present

## 2022-06-03 LAB — GLUCOSE, CAPILLARY
Glucose-Capillary: 175 mg/dL — ABNORMAL HIGH (ref 70–99)
Glucose-Capillary: 171 mg/dL — ABNORMAL HIGH (ref 70–99)
Glucose-Capillary: 151 mg/dL — ABNORMAL HIGH (ref 70–99)
Glucose-Capillary: 184 mg/dL — ABNORMAL HIGH (ref 70–99)

## 2022-06-03 LAB — BASIC METABOLIC PANEL
Anion gap: 6 (ref 5–15)
BUN: 18 mg/dL (ref 8–23)
CO2: 27 mmol/L (ref 22–32)
Calcium: 9 mg/dL (ref 8.9–10.3)
Chloride: 102 mmol/L (ref 98–111)
Creatinine, Ser: 1.38 mg/dL — ABNORMAL HIGH (ref 0.61–1.24)
GFR, Estimated: 55 mL/min — ABNORMAL LOW (ref >60)
Glucose, Bld: 178 mg/dL — ABNORMAL HIGH (ref 70–99)
Potassium: 4.3 mmol/L (ref 3.5–5.1)
Sodium: 135 mmol/L (ref 135–145)

## 2022-06-03 LAB — MAGNESIUM: Magnesium: 2 mg/dL (ref 1.7–2.4)

## 2022-06-03 LAB — PHOSPHORUS: Phosphorus: 3.6 mg/dL (ref 2.5–4.6)

## 2022-06-03 NOTE — Plan of Care (Signed)
  Problem: Education: Goal: Knowledge of General Education information will improve Description: Including pain rating scale, medication(s)/side effects and non-pharmacologic comfort measures Outcome: Progressing   Problem: Health Behavior/Discharge Planning: Goal: Ability to manage health-related needs will improve Outcome: Progressing   Problem: Clinical Measurements: Goal: Ability to maintain clinical measurements within normal limits will improve Outcome: Progressing Goal: Will remain free from infection Outcome: Progressing Goal: Diagnostic test results will improve Outcome: Progressing Goal: Respiratory complications will improve Outcome: Progressing Goal: Cardiovascular complication will be avoided Outcome: Progressing   Problem: Activity: Goal: Risk for activity intolerance will decrease Outcome: Progressing   Problem: Coping: Goal: Level of anxiety will decrease Outcome: Progressing   Problem: Pain Managment: Goal: General experience of comfort will improve Outcome: Progressing   Problem: Safety: Goal: Ability to remain free from injury will improve Outcome: Progressing   Problem: Skin Integrity: Goal: Risk for impaired skin integrity will decrease Outcome: Progressing   Problem: Nutrition: Goal: Adequate nutrition will be maintained Outcome: Not Progressing   Problem: Elimination: Goal: Will not experience complications related to bowel motility Outcome: Not Progressing

## 2022-06-03 NOTE — Plan of Care (Signed)
  Problem: Education: Goal: Knowledge of General Education information will improve Description Including pain rating scale, medication(s)/side effects and non-pharmacologic comfort measures Outcome: Progressing   Problem: Health Behavior/Discharge Planning: Goal: Ability to manage health-related needs will improve Outcome: Progressing   

## 2022-06-03 NOTE — Hospital Course (Signed)
71 year old male with past medical history of Merkel Cell carcinoma, erosive esophagitis, CKD stage IIIb, diabetes type 2, chronic systolic heart failure, coronary artery disease presented to hospital from home with complaints of nausea vomiting and diarrhea.  Of note, he was recently admitted hospital for the same a week ago and at that time had EGD done which showed severe esophagitis.  He was discharged home on Carafate and Protonix but despite that his nausea and vomiting worsened after he was given chemotherapy infusion.  Patient was then admitted to hospital again for further management.

## 2022-06-03 NOTE — Progress Notes (Signed)
Patient ID: Antonio Guerra, male   DOB: 07/15/1951, 71 y.o.   MRN: [id number]    Progress Note   Subjective   Day # 16 CC; persistent abdominal pain, recurrent/persistent ileus in setting of aggressive Merkel cell carcinoma involving the right inguinal area and right pelvis  No new labs, ordering today  Patient did get an enema this morning Resting comfortably and did not awaken to my exam   Objective   Vital signs in last 24 hours: Temp:  [97.7 F (36.5 C)-98.7 F (37.1 C)] 98.7 F (37.1 C) (11/15 0614) Pulse Rate:  [92-105] 105 (11/15 0747) Resp:  [16-18] 16 (11/15 0614) BP: (94-141)/(65-73) 141/72 (11/15 0747) SpO2:  [96 %-99 %] 98 % (11/15 0614) Weight:  [74.7 kg] 74.7 kg (11/15 0500) Last BM Date : 05/31/22 General:    Older African-American male in NAD, sleeping Heart:  Regular rate and rhythm; no murmurs Lungs: Respirations even and unlabored, lungs CTA bilaterally Abdomen:  Soft,  nondistended, tender in the right lower/right mid quadrant with fullness, normal bowel sounds. Extremities:  Without edema. Neurologic:  Alert and oriented,  grossly normal neurologically.  Sleeping currently   Intake/Output from previous day: 11/14 0701 - 11/15 0700 In: -  Out: 300 [Urine:300] Intake/Output this shift: No intake/output data recorded.  Lab Results: No results for input(s): "WBC", "HGB", "HCT", "PLT" in the last 72 hours. BMET No results for input(s): "NA", "K", "CL", "CO2", "GLUCOSE", "BUN", "CREATININE", "CALCIUM" in the last 72 hours. LFT No results for input(s): "PROT", "ALBUMIN", "AST", "ALT", "ALKPHOS", "BILITOT", "BILIDIR", "IBILI" in the last 72 hours. PT/INR No results for input(s): "LABPROT", "INR" in the last 72 hours.  Studies/Results: No results found.     Assessment / Plan:    #27 71 year old African-American male with prior history of CVA, and 2 recent prolonged hospitalizations due to complications of Merkel cell carcinoma involving the right  groin and right pelvis, with evidence of progression at the time of last PET scan October 2023 despite [facility] Per oncology note today he was unable to tolerate radiation and Keytruda discontinued with recent hospitalizations  He has had prolonged/recurrent ileus He is currently comfortable and more stable with an aggressive bowel regimen including Dulcolax suppositories daily, MiraLAX twice daily, IV Reglan And now doing a tapwater enema every other day  Per oncology-no evidence for carcinomatosis on prior imaging but certainly possible, oncology to discuss with patient's daughter again regarding palliative care and consideration of hospice care Not a chemo candidate at present  Plan; labs today Continue aggressive bowel regimen including every other day tapwater enema If he is discharged from the hospital, to skilled nursing facility this regimen will need to be continued No indication for endoscopic intervention at present   GI will sign off, please call for problems     Principal Problem:   Intractable nausea and vomiting Active Problems:   Hyperlipidemia   Chronic systolic congestive heart failure ([place])   Type 2 diabetes mellitus ([place])   Stage 3a chronic kidney disease ([place])   Merkel cell carcinoma ([place])   Acute kidney injury superimposed on chronic kidney disease ([place])   Esophagitis, [place] grade D   Ileus ([place])   Normocytic anemia   Abnormal CT scan, gastrointestinal tract   Protein-calorie malnutrition, severe     LOS: 15 days   Antonio Livingstone PA-C 06/03/2022, 11:24 AM

## 2022-06-03 NOTE — Progress Notes (Signed)
  Progress Note   Patient: Antonio Guerra HYW:[id number] DOB: 02/03/1951 DOA: 05/19/2022     15 DOS: the patient was seen and examined on 06/03/2022   Brief hospital course: 71 year old male with past medical history of Merkel Cell carcinoma, erosive esophagitis, CKD stage IIIb, diabetes type 2, chronic systolic heart failure, coronary artery disease presented to hospital from home with complaints of nausea vomiting and diarrhea.  Of note, he was recently admitted hospital for the same a week ago and at that time had EGD done which showed severe esophagitis.  He was discharged home on Carafate and Protonix but despite that his nausea and vomiting worsened after he was given chemotherapy infusion.  Patient was then admitted to hospital again for further management.   Assessment and Plan: Intractable nausea/vomiting/Ogilvie's syndrome:  Patient was recently admitted a week back for the same.   -Pt had been on Carafate and Protonix for severe esophagitis from previous admission -Serial xrays demonstrated evidence of ileus -GI following. Oncology following -Per GI, recs to continue aggressive bowel regimen including every other day tapwater enemas which will need to be continued moving forward   Severe esophagitis:  Recent EGD diagnosis.  Continue PPI twice daily  No need to repeat EGD   AKI on CKD stage IIIa: Baseline creatinine ranges from 1.3-1.8 .currently creatinine at 1.06 and at baseline.   Merkel cell carcinoma:  Patient has firm mass in the right groin.  Follows up with oncology.  Oncology had recommended palliative care.  Family not interested in hospice yet.  Patient has poor prognosis.     Diabetes mellitus type 2:   Continue sliding scale insulin Accu-Cheks. Glycemic trends stable   Hyperlipidemia: Lipitor on hold.   Normocytic anemia: Hgb trends stable   Hypokalemia: Resolved.   Chronic systolic congestive heart failure: Last 2D echocardiogram on 9/23 showed EF of 30 to  35%.   -Increasing the dose of carvedilol to 25 mg twice daily -Continue Imdur.     Hypertension:  -Continue Imdur, coreg, losartan BP better controlled   Deconditioning/debility:  Physical therapy has recommended skilled nursing facility placement.        Subjective: Still complaining of abd discomfort  Physical Exam: Vitals:   06/03/22 0500 06/03/22 0614 06/03/22 0747 06/03/22 1137  BP:  (!) 140/73 (!) 141/72 136/78  Pulse:  99 (!) 105 95  Resp:  16  16  Temp:  98.7 F (37.1 C)  98 F (36.7 C)  TempSrc:  Oral  Oral  SpO2:  98%  96%  Weight: 74.7 kg     Height:       General exam: Awake, laying in bed, in nad Respiratory system: Normal respiratory effort, no wheezing Cardiovascular system: regular rate, s1, s2 Gastrointestinal system: Soft, nondistended, positive BS Central nervous system: CN2-12 grossly intact, strength intact Extremities: Perfused, no clubbing Skin: Normal skin turgor, no notable skin lesions seen Psychiatry: Mood normal // no visual hallucinations   Data Reviewed:  Labs reviewed: Na 135, K 4.3, Cr 1.38  Family Communication: Pt in room, family not at bedside  Disposition: Status is: Inpatient Remains inpatient appropriate because: Severity of illness  Planned Discharge Destination: Skilled nursing facility    Author: Marylu Lund, MD 06/03/2022 5:51 PM  For on call review [email or web address].

## 2022-06-03 NOTE — Care Management Important Message (Signed)
Important Message  Patient Details IM Letter given Name: Antonio Guerra MRN: [id number] Date of Birth: Mar 01, 1951   Medicare Important Message Given:  Yes     Kerin Salen 06/03/2022, 11:55 AM

## 2022-06-03 NOTE — Progress Notes (Signed)
Antonio Guerra   DOB:[birth year]   FI#:[id number]   CSN#:[id number]  Subjective:  Patient was in pain. He was getting an enema done at the time of my visit. He tells me that he hasn't been eating well, having episodes of abdominal pain. According to nurse, he has only been having stool smears, no good bowel movement.  Objective:  Vitals:   06/02/22 2113 06/03/22 0614  BP: 106/65 (!) 140/73  Pulse: (!) 103 99  Resp: 18 16  Temp: 98.1 F (36.7 C) 98.7 F (37.1 C)  SpO2: 99% 98%    Body mass index is 21.73 kg/m.  Intake/Output Summary (Last 24 hours) at 06/03/2022 4166 Last data filed at 06/02/2022 2100 Gross per 24 hour  Intake --  Output 300 ml  Net -300 ml               He appears frail, lost some weight  Sclerae anicteric  Rest of the exam deferred, undergoing enema  CBG (last 3)  Recent Labs    06/02/22 1212 06/02/22 1627 06/02/22 2120  GLUCAP 199* 154* 240*     Labs:  Lab Results  Component Value Date   WBC 4.2 05/29/2022   HGB 9.7 (L) 05/29/2022   HCT 29.2 (L) 05/29/2022   MCV 82.7 05/29/2022   PLT 286 05/29/2022   NEUTROABS 3.4 05/19/2022    '@LASTCHEMISTRY'$ @  Urine Studies No results for input(s): "UHGB", "CRYS" in the last 72 hours.  Invalid input(s): "UACOL", "UAPR", "USPG", "UPH", "UTP", "UGL", "UKET", "UBIL", "UNIT", "UROB", "ULEU", "UEPI", "UWBC", "URBC", "UBAC", "CAST", "UCOM", "BILUA"  Basic Metabolic Panel: Recent Labs  Lab 05/28/22 0521 05/29/22 0739  NA 137 135  K 3.7 4.2  CL 108 106  CO2 23 25  GLUCOSE 127* 196*  BUN <5* 7*  CREATININE 1.15 1.06  CALCIUM 8.9 9.2  MG 1.7 1.9   GFR Estimated Creatinine Clearance: 68.5 mL/min (by C-G formula based on SCr of 1.06 mg/dL). Liver Function Tests: No results for input(s): "AST", "ALT", "ALKPHOS", "BILITOT", "PROT", "ALBUMIN" in the last 168 hours. No results for input(s): "LIPASE", "AMYLASE" in the last 168 hours. No results for input(s): "AMMONIA" in the last 168 hours. Coagulation  profile No results for input(s): "INR", "PROTIME" in the last 168 hours.  CBC: Recent Labs  Lab 05/28/22 0521 05/29/22 0739  WBC 3.9* 4.2  HGB 9.5* 9.7*  HCT 28.0* 29.2*  MCV 83.6 82.7  PLT 274 286   Cardiac Enzymes: No results for input(s): "CKTOTAL", "CKMB", "CKMBINDEX", "TROPONINI" in the last 168 hours. BNP: Invalid input(s): "POCBNP" CBG: Recent Labs  Lab 06/02/22 0037 06/02/22 0752 06/02/22 1212 06/02/22 1627 06/02/22 2120  GLUCAP 160* 162* 199* 154* 240*   D-Dimer No results for input(s): "DDIMER" in the last 72 hours. Hgb A1c No results for input(s): "HGBA1C" in the last 72 hours. Lipid Profile No results for input(s): "CHOL", "HDL", "LDLCALC", "TRIG", "CHOLHDL", "LDLDIRECT" in the last 72 hours. Thyroid function studies No results for input(s): "TSH", "T4TOTAL", "T3FREE", "THYROIDAB" in the last 72 hours.  Invalid input(s): "FREET3" Anemia work up No results for input(s): "VITAMINB12", "FOLATE", "FERRITIN", "TIBC", "IRON", "RETICCTPCT" in the last 72 hours. Microbiology No results found for this or any previous visit (from the past 240 hour(s)).    Studies:  DG Abd 2 Views  Result Date: 06/01/2022 CLINICAL DATA:  Nausea and vomiting EXAM: ABDOMEN - 2 VIEW COMPARISON:  05/31/2022 FINDINGS: Gaseous distension of the colon. Decreased small-bowel distension compared to prior. No free intraperitoneal air is  evident. Atherosclerotic vascular calcifications. IMPRESSION: Gaseous distension of the colon. Decreased small-bowel distension compared to prior. Electronically Signed   By: Davina Poke D.O.   On: 06/01/2022 10:16    Assessment/ Plan  [name] with past medical history significant for diabetes, hypertension, history of MI, coronary artery disease, stroke followed by Medical Oncology for Merkel cell carcinoma. He had PET/CT which showed enlarged FDG avid nodal metastasis identified in the right common iliac,  external iliac and right inguinal node chains.  No signs to suggest distant nodal metastasis or solid organ metastasis.   Imaging after 3 cycles of Keytruda with progression hence we have discussed about doing carbo etoposide at 50% dose reduction given his poor performance status. On the day of chemotherapy, he had intractable nausea while sitting in infusion along with vomiting, hence we had to defer chemo and sent him to the ED Since then, he has remained inpatient and I got a phone call from the hospitalist to see him again and discuss goals of care.  Unfortunately Merkel cell is a very aggressive cancer and he is not curable by chemo alone. Even though chemo can induce great response, it is usually short lived in the order of few months. He couldn't tolerate radiation, this was attempted. He was not a surgical candidate in the past, now with progression, I dont think he will be any better surgical candidate than he was.  I am not sure if this ileus is secondary to carcinoma, but despite aggressive laxative regimen, he remains constipated. From my discussion with hospitalist, apparently there is some concern if he has carcinomatosis. This is certainly possible although never demonstrated on any imaging so far.  He continues to become weaker, losing weight, not eating well. I clearly explained to him that he should focus on being comfortable given the incurable nature of the cancer at this time. I recommend palliative care to re engage and consider hospice care. I had multiple discussions with family in the past about goals of care, unfortunately daughter was not ready the last time. I will try to give her a call again. He is not a CHEMO candidate at this time. If he improves dramatically and gets stronger, then we can reconsider.  Please call us back with any questions or concerns.  Total time spent: 35 min including review of records, discussion with hospitalist, discussion with patient and  coordination of care.  Benay Pike, MD 06/03/2022  6:32 AM

## 2022-06-04 ENCOUNTER — Encounter: Payer: Self-pay | Admitting: Hematology and Oncology

## 2022-06-04 ENCOUNTER — Other Ambulatory Visit (HOSPITAL_COMMUNITY): Payer: Self-pay

## 2022-06-04 LAB — COMPREHENSIVE METABOLIC PANEL
ALT: 19 U/L (ref 0–44)
AST: 30 U/L (ref 15–41)
Albumin: 3.3 g/dL — ABNORMAL LOW (ref 3.5–5.0)
Alkaline Phosphatase: 82 U/L (ref 38–126)
Anion gap: 6 (ref 5–15)
BUN: 14 mg/dL (ref 8–23)
CO2: 26 mmol/L (ref 22–32)
Calcium: 9.3 mg/dL (ref 8.9–10.3)
Chloride: 103 mmol/L (ref 98–111)
Creatinine, Ser: 1.13 mg/dL (ref 0.61–1.24)
GFR, Estimated: >60 mL/min (ref >60)
Glucose, Bld: 189 mg/dL — ABNORMAL HIGH (ref 70–99)
Potassium: 4.5 mmol/L (ref 3.5–5.1)
Sodium: 135 mmol/L (ref 135–145)
Total Bilirubin: 0.7 mg/dL (ref 0.3–1.2)
Total Protein: 7.1 g/dL (ref 6.5–8.1)

## 2022-06-04 LAB — GLUCOSE, CAPILLARY
Glucose-Capillary: 158 mg/dL — ABNORMAL HIGH (ref 70–99)
Glucose-Capillary: 232 mg/dL — ABNORMAL HIGH (ref 70–99)
Glucose-Capillary: 207 mg/dL — ABNORMAL HIGH (ref 70–99)

## 2022-06-04 LAB — CBC
HCT: 32.7 % — ABNORMAL LOW (ref 39.0–52.0)
Hemoglobin: 10.6 g/dL — ABNORMAL LOW (ref 13.0–17.0)
MCH: 27.7 pg (ref 26.0–34.0)
MCHC: 32.4 g/dL (ref 30.0–36.0)
MCV: 85.6 fL (ref 80.0–100.0)
Platelets: 229 10*3/uL (ref 150–400)
RBC: 3.82 MIL/uL — ABNORMAL LOW (ref 4.22–5.81)
RDW: 15 % (ref 11.5–15.5)
WBC: 4.3 10*3/uL (ref 4.0–10.5)
nRBC: 0 % (ref 0.0–0.2)

## 2022-06-04 MED ORDER — LOSARTAN POTASSIUM 25 MG PO TABS: 25.0000 mg | ORAL_TABLET | Freq: Every day | ORAL | 0 refills | Status: DC

## 2022-06-04 MED ORDER — POLYETHYLENE GLYCOL 3350 17 G PO PACK: 17.0000 g | PACK | Freq: Two times a day (BID) | ORAL | 0 refills | Status: AC

## 2022-06-04 MED ORDER — OXYCODONE HCL 5 MG PO TABS: 5.0000 mg | ORAL_TABLET | Freq: Four times a day (QID) | ORAL | 0 refills | Status: DC | PRN

## 2022-06-04 MED ORDER — MAGNESIUM HYDROXIDE 400 MG/5ML PO SUSP: 30.0000 mL | Freq: Every day | ORAL | 0 refills | Status: DC

## 2022-06-04 MED ORDER — CARVEDILOL 25 MG PO TABS: 25.0000 mg | ORAL_TABLET | Freq: Two times a day (BID) | ORAL | 0 refills | Status: DC

## 2022-06-04 MED ORDER — PANTOPRAZOLE SODIUM 40 MG PO TBEC
40.0000 mg | DELAYED_RELEASE_TABLET | Freq: Two times a day (BID) | ORAL | 0 refills | Status: DC
  Filled 2022-06-04: qty 60, 30d supply, fill #0

## 2022-06-04 MED ORDER — CARVEDILOL 12.5 MG PO TABS
12.5000 mg | ORAL_TABLET | Freq: Two times a day (BID) | ORAL | Status: DC
  Administered 2022-06-05: 12.5 mg via ORAL
  Filled 2022-06-04: qty 1

## 2022-06-04 MED ORDER — SODIUM CHLORIDE 0.9 % IV BOLUS
500.0000 mL | Freq: Once | INTRAVENOUS | Status: AC
  Administered 2022-06-04: 500 mL via INTRAVENOUS

## 2022-06-04 MED ORDER — BISACODYL 10 MG RE SUPP: 10.0000 mg | Freq: Every day | RECTAL | 0 refills | Status: AC

## 2022-06-04 NOTE — Discharge Summary (Addendum)
Physician Discharge Summary   Patient: Antonio Guerra MRN: [id number] DOB: Dec 09, 1950  Admit date:     05/19/2022  Discharge date: 06/05/22  Discharge Physician: Marylu Lund   PCP: [name], MD   Recommendations at discharge:    Follow up with PCP in 1-2 weeks Follow up with Oncology as needed Per GI, please do the following: aggressive bowel regimen including Dulcolax suppositories daily, MiraLAX twice daily, scheduled Reglan, and doing a tapwater enema every other day  Discharge Diagnoses: Principal Problem:   Intractable nausea and vomiting Active Problems:   Esophagitis, [place] grade D   Hyperlipidemia   Chronic systolic congestive heart failure (HCC)   Type 2 diabetes mellitus ([place])   Stage 3a chronic kidney disease ([place])   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 *  Hospital Course: 71 year old male with past medical history of Merkel Cell carcinoma, erosive esophagitis, CKD stage IIIb, diabetes type 2, chronic systolic heart failure, coronary artery disease presented to hospital from home with complaints of nausea vomiting and diarrhea.  Of note, he was recently admitted hospital for the same a week ago and at that time had EGD done which showed severe esophagitis.  He was discharged home on Carafate and Protonix but despite that his nausea and vomiting worsened after he was given chemotherapy infusion.  Patient was then admitted to hospital again for further management.   Assessment and Plan: Intractable nausea/vomiting/Ogilvie's syndrome:  Patient was recently admitted a week back for the same.   -Pt had been on Carafate and Protonix for severe esophagitis from previous admission -Serial xrays demonstrated  evidence of ileus -GI had been following. Oncology had been following -Per GI, recs to continue aggressive bowel regimen including every other day tapwater enemas which will need to be continued moving forward, in addition to bowel regimen listed above -By day of d/c, pt reported by RN to tolerate diet. Bowel sounds were auscultated on exam and pt reported to have BM   Severe esophagitis:  Recent EGD diagnosis.  Continue PPI twice daily  No need to repeat EGD, GI was consulted this visit   AKI on CKD stage IIIa: Baseline creatinine ranges from 1.3-1.8 .remained stable   Merkel cell carcinoma:  Patient has firm mass in the right groin.  Follows up with oncology.  Oncology had recommended palliative care.  Family not interested in hospice yet.  Patient has poor prognosis.     Diabetes mellitus type 2:   Continue sliding scale insulin Accu-Cheks. Glycemic trends stable   Hyperlipidemia: Lipitor on hold.   Normocytic anemia: Hgb trends stable   Hypokalemia: Resolved.   Chronic systolic congestive heart failure: Last 2D echocardiogram on 9/23 showed EF of 30 to 35%.   -Increasing the dose of carvedilol to 25 mg twice daily -Continue Imdur.     Hypertension:  -Had been on Imdur, coreg, losartan -Noted to be hypotensive on 11/16 with sbp into the 60's, improved with IVF bolus -Have d/c losartan, which was started this admit, and reduced coreg to home dose of 12.38m   Deconditioning/debility:  Physical therapy has recommended skilled nursing facility placement.         Consultants: GI, Oncology Procedures performed:   Disposition: Skilled nursing facility Diet recommendation:  Soft diet DISCHARGE MEDICATION: Allergies as of 06/05/2022       Reactions   Lisinopril Other (See Comments)   Caused pt to have chest pains.    Tape Itching, Rash        Medication List     STOP taking these medications    atorvastatin 80 MG tablet Commonly known as: LIPITOR    dexamethasone 4 MG tablet Commonly known as: DECADRON       TAKE these medications    acetaminophen 650 MG CR tablet Commonly known as: TYLENOL Take 650 mg by mouth every 8 (eight) hours as needed for pain.   alum & mag hydroxide-simeth 200-200-20 MG/5ML suspension Commonly known as: MAALOX/MYLANTA Take 15 mLs by mouth every 4 (four) hours as needed for indigestion or heartburn.   BD Pen Needle Nano U/F 32G X 4 MM Misc Generic drug: Insulin Pen Needle AS DIRECTED TWICE DAILY   bisacodyl 10 MG suppository Commonly known as: DULCOLAX Place 1 suppository (10 mg total) rectally daily.   carvedilol 12.5 MG tablet Commonly known as: COREG Take 12.5 mg by mouth 2 (two) times daily with a meal.   clopidogrel 75 MG tablet Commonly known as: PLAVIX TAKE 1 TABLET(75 MG) BY MOUTH DAILY What changed: See the new instructions.   cycloSPORINE 0.05 % ophthalmic emulsion Commonly known as: RESTASIS Place 1 drop into both eyes 2 (two) times daily.   Depend Adjustable Underwear Lg Misc 1 application by Does not apply route as needed.   FreeStyle Libre 14 Day Reader Devi 1 Device by Does not apply route every morning.   gabapentin 400 MG capsule Commonly known as: NEURONTIN TAKE 1 CAPSULE BY MOUTH TWICE DAILY   isosorbide mononitrate 10 MG tablet Commonly known as: ISMO Take 10 mg by mouth daily.   lidocaine-prilocaine cream Commonly known as: EMLA Apply to port site at least 1 hour prior to use   magnesium hydroxide 400 MG/5ML suspension Commonly known as: MILK OF MAGNESIA Take 30 mLs by mouth daily.   metoCLOPramide 10 MG tablet Commonly known as: REGLAN Take 1 tablet (10 mg total) by mouth every 6 (six) hours as needed for up to 20 days for nausea.   nitroGLYCERIN 0.4 MG SL tablet Commonly known as: NITROSTAT PLACE 1 TABLET UNDER THE TONGUE EVERY 5 MINS AS NEEDED FOR CHEST PAIN What changed: See the new instructions.   ondansetron 8 MG tablet Commonly known as:  Zofran Take 1 tablet (8 mg total) by mouth every 8 (eight) hours as needed for nausea or vomiting. Start on third day after chemotherapy.   onetouch ultrasoft lancets USE AS DIRECTED   OneTouch Verio test strip Generic drug: glucose blood USE TO TEST BLOOD SUGAR 2 TO 3 TIMES A DAY   OneTouch Verio w/Device Kit 30 Units by Does not apply route daily.   oxyCODONE 5 MG immediate release tablet Commonly known as: Oxy IR/ROXICODONE Take 1 tablet (5 mg total) by mouth every 6 (six) hours as needed for moderate pain.   pantoprazole 40 MG tablet Commonly known as: PROTONIX Take 1 tablet (40 mg total) by mouth 2 (two) times daily.   polyethylene glycol 17 g packet Commonly known as: MIRALAX / GLYCOLAX Take 17 g by mouth 2 (two) times daily.   prochlorperazine 10 MG tablet Commonly known as: COMPAZINE Take 1 tablet (10 mg total) by mouth every 6 (six) hours as needed for nausea or vomiting (Nausea or vomiting).   Salonpas Pain Relieving 4 % Generic drug:  lidocaine Place 1 patch onto the skin daily as needed (pain).   sucralfate 1 g tablet Commonly known as: Carafate Take 1 tablet (1 g total) by mouth 4 (four) times daily -  with meals and at bedtime for 7 days. May use longer if needed.   Trulicity 4.81 EH/6.3JS Sopn Generic drug: Dulaglutide Inject 0.75 mg into the skin once a week. Friday or saturday        Follow-up Information     [name], MD Follow up.   Specialty: Family Medicine Why: Hospital follow up Contact information: [address] [place] [place] [id number] [phone]         Benay Pike, MD Follow up.   Specialty: Hematology and Oncology Why: As needed Contact information: [place] [address] [phone]                Discharge Exam: Danley Danker Weights   06/02/22 0500 06/03/22 0500 06/05/22 0500  Weight: 76.9 kg 74.7 kg 75 kg   General exam: Awake, laying in bed, in nad Respiratory system: Normal respiratory  effort, no wheezing Cardiovascular system: regular rate, s1, s2 Gastrointestinal system: Soft, nondistended, positive BS Central nervous system: CN2-12 grossly intact, strength intact Extremities: Perfused, no clubbing Skin: Normal skin turgor, no notable skin lesions seen Psychiatry: Mood normal // no visual hallucinations   Condition at discharge: poor  The results of significant diagnostics from this hospitalization (including imaging, microbiology, ancillary and laboratory) are listed below for reference.   Imaging Studies: DG Abd 2 Views  Result Date: 06/01/2022 CLINICAL DATA:  Nausea and vomiting EXAM: ABDOMEN - 2 VIEW COMPARISON:  05/31/2022 FINDINGS: Gaseous distension of the colon. Decreased small-bowel distension compared to prior. No free intraperitoneal air is evident. Atherosclerotic vascular calcifications. IMPRESSION: Gaseous distension of the colon. Decreased small-bowel distension compared to prior. Electronically Signed   By: Davina Poke D.O.   On: 06/01/2022 10:16   DG Abd 1 View  Result Date: 05/31/2022 CLINICAL DATA:  Ileus. EXAM: ABDOMEN - 1 VIEW COMPARISON:  05/29/2022 FINDINGS: Continued dilated large and small bowel. There is some formed stool in the right colon as before. Small bowel loops measure up to 3.6 cm in diameter. There is some increasing gas in small bowel loops in the right lower quadrant compared to previous. Atherosclerosis noted. No conventional radiographic findings of free intraperitoneal gas. IMPRESSION: 1. Continued dilated large and small bowel with some increasing gas in small bowel loops in the right lower quadrant. Appearance favors ileus over obstruction. 2. Atherosclerosis. Electronically Signed   By: Van Clines M.D.   On: 05/31/2022 09:46   DG Abd 1 View  Result Date: 05/29/2022 CLINICAL DATA:  Nausea and vomiting EXAM: ABDOMEN - 1 VIEW COMPARISON:  Abdominal radiograph dated 05/24/2022 FINDINGS: Nonobstructive bowel gas  pattern. Continued gaseous distention small and large bowel loops to the level of the rectum. No abnormal radio-opaque calculi or mass effect. No acute or substantial osseous abnormality. The sacrum and coccyx are partially obscured by overlying bowel contents. IMPRESSION: Continued gaseous distention of small and large bowel loops to the level of the rectum, likely ileus. Electronically Signed   By: Darrin Nipper M.D.   On: 05/29/2022 14:52   CT ABDOMEN PELVIS W CONTRAST  Result Date: 05/26/2022 CLINICAL DATA:  Bowel obstruction suspected contrast EXAM: CT ABDOMEN AND PELVIS WITH CONTRAST TECHNIQUE: Multidetector CT imaging of the abdomen and pelvis was performed using the standard protocol following bolus administration of intravenous contrast. RADIATION  DOSE REDUCTION: This exam was performed according to the departmental dose-optimization program which includes automated exposure control, adjustment of the mA and/or kV according to patient size and/or use of iterative reconstruction technique. CONTRAST:  161m OMNIPAQUE IOHEXOL 300 MG/ML  SOLN COMPARISON:  CT abdomen pelvis dated May 11, 2022 FINDINGS: Lower chest: Bibasilar atelectasis. Patulous distal esophagus. Small hiatal hernia. Hepatobiliary: No focal liver abnormality is seen. No gallstones, gallbladder wall thickening, or biliary dilatation. Pancreas: Unremarkable. No pancreatic ductal dilatation or surrounding inflammatory changes. Spleen: Normal in size without focal abnormality. Adrenals/Urinary Tract: Bilateral adrenal glands are unremarkable. Numerous small peripheral low-attenuation renal lesions, likely due to scarring. No hydronephrosis or nephrolithiasis. Bladder is unremarkable. Stomach/Bowel: Increased wall thickening of the rectosigmoid colon. Prominent gas and fluid-filled loops of ascending and transverse colon. Multiple dilated loops of distal small bowel with no focal transition point. Vascular/Lymphatic: Severe aortic  atherosclerosis. Large right inguinal mass, unchanged when compared with prior exam. Unchanged enlarged right common external iliac lymph nodes. Reproductive: Prostatomegaly. Other: Trace abdominal ascites. Musculoskeletal: No acute or significant osseous findings. IMPRESSION: 1. Multiple mildly dilated loops of distal small bowel with no focal transition point and prominent loops of colon, findings are likely due to ileus. 2. Increased wall thickening of the rectosigmoid colon, likely due to colitis. 3. Trace abdominal ascites. 4. Unchanged large right inguinal mass and enlarged right common external iliac lymph nodes. 5. Patulous distal esophagus, findings can be seen in the setting of esophageal dysmotility. 6. Aortic Atherosclerosis (ICD10-I70.0). Electronically Signed   By: LYetta GlassmanM.D.   On: 05/26/2022 17:17   DG Abd 2 Views  Result Date: 05/25/2022 CLINICAL DATA:  Abdominal pain.  Emesis. EXAM: ABDOMEN - 2 VIEW COMPARISON:  Abdominal radiograph 05/24/2022 FINDINGS: No intraperitoneal free air is identified. Moderate gaseous distension of the colon is similar to the prior study. No significant small bowel dilatation is evident. Atherosclerotic vascular calcifications are noted. IMPRESSION: Unchanged moderate gaseous distension of the colon. Electronically Signed   By: ALogan BoresM.D.   On: 05/25/2022 16:54   DG Abd 1 View  Result Date: 05/24/2022 CLINICAL DATA:  Abdominal distension with nausea. EXAM: ABDOMEN - 1 VIEW COMPARISON:  05/22/2022 and prior radiographs FINDINGS: Gaseous distension of the colon has decreased. Gas in nondistended small bowel loops are present. No suspicious calcifications are present. No acute bony abnormalities are noted. IMPRESSION: Decreased colonic distension.  No other significant abnormalities. Electronically Signed   By: JMargarette CanadaM.D.   On: 05/24/2022 13:26   DG Abd 1 View  Result Date: 05/22/2022 CLINICAL DATA:  71year old male with recent abdominal  pain, nausea vomiting. Bowel dilatation since 05/19/2022 suspicious for ileus. EXAM: ABDOMEN - 1 VIEW COMPARISON:  05/20/2022 and earlier. FINDINGS: Portable AP view at 0436 hours. Enteric tube no longer visible. Redundant gas-filled large bowel loops throughout the abdomen and pelvis, including distal colon gas. Some superimposed retained stool. The pattern is not significantly changed from 05/19/2022, and increased from CT Abdomen and Pelvis 05/11/2022 although some gas-filled redundant large bowel loops were present at that time. No dilated small bowel identified. No pneumoperitoneum identified on these portable views. Lung bases appear negative. Stable visualized osseous structures. IMPRESSION: 1. Enteric tube no longer visible. 2. Large bowel ileus gas pattern not significantly improved from 05/19/2022. No small bowel dilatation to suggest a mechanical obstruction. Electronically Signed   By: HGenevie AnnM.D.   On: 05/22/2022 06:41   DG Abd 1 View  Result Date: 05/20/2022 CLINICAL DATA:  [id number].  Check NGT placement. EXAM: ABDOMEN - 1 VIEW COMPARISON:  Flat plate abdomen yesterday at 9:26 p.m. FINDINGS: 4:08 a.m. The tip of the patient's NGT is well inside the stomach either in the distal body or antrum of the stomach. Large volume fecal stasis again noted without visible dilated small bowel. There are no supine findings of free air. IMPRESSION: 1. The tip of the patient's NGT is well inside the stomach. 2. Large volume fecal stasis. Electronically Signed   By: Telford Nab M.D.   On: 05/20/2022 05:12   DG Abd 1 View  Result Date: 05/19/2022 CLINICAL DATA:  Nasogastric tube present. EXAM: ABDOMEN - 1 VIEW COMPARISON:  Radiograph earlier today. FINDINGS: Tip and side port of the enteric tube below the diaphragm in the stomach. Large volume of stool in the right colon. Gaseous distension of transverse and left colon. IMPRESSION: Tip and side port of the enteric tube below the diaphragm in the stomach.  Electronically Signed   By: Keith Rake M.D.   On: 05/19/2022 22:05   DG Abd 1 View  Result Date: 05/19/2022 CLINICAL DATA:  NG tube placement. EXAM: ABDOMEN - 1 VIEW COMPARISON:  Earlier film, same date. FINDINGS: The NG tube tip is just below the GE junction and the proximal port is in the distal esophagus. This should be advanced several cm. Stable dilated colon. IMPRESSION: NG tube tip is just below the GE junction and the proximal port is in the distal esophagus. This should be advanced several cm. Electronically Signed   By: Marijo Sanes M.D.   On: 05/19/2022 21:02   DG Abd 1 View  Result Date: 05/19/2022 CLINICAL DATA:  Vomiting. EXAM: ABDOMEN - 1 VIEW COMPARISON:  None Available. FINDINGS: Dilated colon is noted with moderate amount of stool present. No small bowel dilatation is noted. Vascular calcifications are noted. IMPRESSION: Dilated colon most consistent with ileus. Small bowel dilatation is noted. Moderate stool burden. Electronically Signed   By: Marijo Conception M.D.   On: 05/19/2022 15:36   DG Chest 1 View  Result Date: 05/19/2022 CLINICAL DATA:  Shortness of breath.  Vomiting. EXAM: CHEST  1 VIEW COMPARISON:  04/26/2022 FINDINGS: The cardiomediastinal contours are normal. Minor atelectasis in the lung bases. Pulmonary vasculature is normal. No consolidation, pleural effusion, or pneumothorax. No acute osseous abnormalities are seen. IMPRESSION: Minor bibasilar atelectasis. Electronically Signed   By: Keith Rake M.D.   On: 05/19/2022 15:05   CT ABDOMEN PELVIS WO CONTRAST  Result Date: 05/11/2022 CLINICAL DATA:  Abdominal pain with nausea and vomiting. EXAM: CT ABDOMEN AND PELVIS WITHOUT CONTRAST TECHNIQUE: Multidetector CT imaging of the abdomen and pelvis was performed following the standard protocol without IV contrast. RADIATION DOSE REDUCTION: This exam was performed according to the departmental dose-optimization program which includes automated exposure  control, adjustment of the mA and/or kV according to patient size and/or use of iterative reconstruction technique. COMPARISON:  May 04, 2022 FINDINGS: Lower chest: Mild atelectatic changes are seen within the right middle lobe and bilateral lung bases. Hepatobiliary: No focal liver abnormality is seen. No gallstones, gallbladder wall thickening, or biliary dilatation. Pancreas: Numerous tiny parenchymal calcifications are seen scattered throughout the pancreas. There is no evidence of pancreatic ductal dilatation or surrounding inflammatory fat stranding. Spleen: Normal in size without focal abnormality. Adrenals/Urinary Tract: Adrenal glands are unremarkable. Kidneys are normal in size, without obstructing renal calculi or hydronephrosis. 2 mm nonobstructing renal calculi are seen within both kidneys. Mild focal cortical scarring  is seen along the posterolateral aspect of the mid to lower left kidney. Bladder is unremarkable. Stomach/Bowel: There is a small, stable hiatal hernia. Appendix appears normal. Stool is seen throughout the large bowel. No evidence of bowel dilatation. Mild, asymmetric anterior and right lateral rectal wall thickening is seen (axial CT images 86 through 88, CT series 2). Vascular/Lymphatic: Aortic atherosclerosis. Multiple stable enlarged right pelvic and right inguinal lymph nodes are seen (the largest measuring approximately 3.3 cm x 2.8 cm), with a stable 7.3 cm x 7.5 cm necrotic right inguinal mass. Reproductive: The prostate gland is mildly enlarged and unchanged in size. Other: A stable 16 mm x 11 mm fat containing umbilical hernia is seen. No abdominopelvic ascites. Musculoskeletal: No acute or significant osseous findings. IMPRESSION: 1. Mild, asymmetric anterior and right lateral rectal wall thickening, as described above. While this may be, in part, secondary to underdistention, sequelae associated with an underlying neoplastic process cannot be excluded. Correlation with  colonoscopy is recommended. 2. Stable right pelvic and right inguinal lymphadenopathy with a stable 7.3 cm x 7.5 cm necrotic right inguinal mass. 3. Small, stable hiatal hernia. 4. Bilateral 2 mm nonobstructing renal calculi. 5. Aortic atherosclerosis. Aortic Atherosclerosis (ICD10-I70.0). Electronically Signed   By: Virgina Norfolk M.D.   On: 05/11/2022 21:16   NM PET Image Restag (PS) Skull Base To Thigh  Result Date: 05/10/2022 CLINICAL DATA:  Subsequent treatment strategy for history of Merkel cell, assess response to therapy. EXAM: NUCLEAR MEDICINE PET SKULL BASE TO THIGH TECHNIQUE: 9.2 mCi F-18 FDG was injected intravenously. Full-ring PET imaging was performed from the skull base to thigh after the radiotracer. CT data was obtained and used for attenuation correction and anatomic localization. Fasting blood glucose: 123 mg/dl COMPARISON:  Previous PET imaging from February 20, 2022. FINDINGS: Mediastinal blood pool activity: SUV max 2.86 Liver activity: SUV max NA NECK: No hypermetabolic lymph nodes in the neck. Incidental CT findings: None. CHEST: No hypermetabolic mediastinal or hilar nodes. No suspicious pulmonary nodules on the CT scan. Incidental CT findings: Extensive coronary artery disease most notably on the LEFT. No pericardial effusion. Aortic atherosclerosis. No adenopathy by size criteria in the chest. Basilar atelectasis. Airways are patent. ABDOMEN/PELVIS: Increasing size of RIGHT pelvic adenopathy and RIGHT groin mass. New RIGHT pelvic sidewall adenopathy. Mass in the RIGHT groin measuring 7.6 x 7.7 cm previously 7.6 x 6.8 cm now within maximum SUV of 14.65 previously 22.08. Some signs of central necrosis. Adjacent lymph nodes also with increase in size and comparison references are made to the August 4 PET examination. Nodes are stable compared to recent imaging from October of 2023. Similarly hypermetabolic lymph nodes which are newly enlarged (image 184/4) 14 mm as compared to 10 mm  size. New RIGHT pelvic sidewall adenopathy (image 180/4) 22 mm short axis showing a maximum SUV of 14.50. External iliac adenopathy enlarged since previous imaging 23 mm (image 183/4) previously 12 mm maximum SUV on today's study of 7.45 as compared to 6.8. Large RIGHT external iliac lymph node (image 177/4) 2.7 as compared to 2.2 cm greatest axial dimension similar FDG uptake as seen similarly FDG avid to the adjacent RIGHT external iliac lymph node. Previously at 10.09. Enlarging RIGHT common iliac lymph node (image 155/4) 16 mm short axis previously 5 mm showing a maximum SUV of 10.2 as compared to 4.96. Similar enlargement and increase in metabolic activity associated with an adjacent RIGHT internal-external iliac bifurcation node 163/4. No lower abdominal retroperitoneal adenopathy or solid organ disease. Incidental  CT findings: Aortic atherosclerosis. No acute findings in the abdomen. Decreased distension of small bowel since previous imaging. SKELETON: No focal hypermetabolic activity to suggest skeletal metastasis. Small focus of soft tissue density in the LEFT lower quadrant in the inguinal region associated with the dermis measures 14 mm previously approximately 14 mm with minimal FDG uptake which is not substantially changed. Incidental CT findings: None. IMPRESSION: 1. Increasing size and increase in size of RIGHT pelvic adenopathy and RIGHT groin mass. Dominant mass does show measurable interval decrease in FDG uptake and some central necrosis. 2. New RIGHT pelvic sidewall adenopathy with increasing size of and increasing metabolic activity associated with RIGHT common and upper external iliac adenopathy at the internal-external bifurcation. 3. Increasing size of an hypermetabolic changes related to lymph nodes adjacent to the dominant RIGHT groin mass. 4. No hypermetabolic abdominal retroperitoneal adenopathy or other evidence of distant metastatic disease. 5. Skin lesion in the LEFT lower quadrant of  uncertain significance is unchanged. 6. Decreased distension of small bowel since previous imaging. 7. Extensive coronary artery disease most notably on the LEFT. 8. Aortic atherosclerosis. Aortic Atherosclerosis (ICD10-I70.0). Electronically Signed   By: Zetta Bills M.D.   On: 05/10/2022 19:50    Microbiology: Results for orders placed or performed during the hospital encounter of 05/19/22  MRSA Next Gen by PCR, Nasal     Status: Abnormal   Collection Time: 05/23/22  5:49 PM   Specimen: Nasal Mucosa; Nasal Swab  Result Value Ref Range Status   MRSA by PCR Next Gen DETECTED (A) NOT DETECTED Final    Comment: CRITICAL RESULT CALLED TO, READ BACK BY AND VERIFIED WITH: Daneil Dolin, RN AT 2203 MH (NOTE) The GeneXpert MRSA Assay (FDA approved for NASAL specimens only), is one component of a comprehensive MRSA colonization surveillance program. It is not intended to diagnose MRSA infection nor to guide or monitor treatment for MRSA infections. Test performance is not FDA approved in patients less than 70 years old. Performed at [facility], [place] [address], [place], [place] [address]     Labs: CBC: Recent Labs  Lab 06/04/22 1026 06/05/22 0429 06/05/22 0752  WBC 4.3 4.2 4.4  HGB 10.6* 7.4* 9.8*  HCT 32.7* 22.1* 30.4*  MCV 85.6 82.8 85.2  PLT 229 271 728   Basic Metabolic Panel: Recent Labs  Lab 06/03/22 1359 06/04/22 1026 06/05/22 0429  NA 135 135 138  K 4.3 4.5 4.7  CL 102 103 104  CO2 _0 GLUCOSE 178* 189* 173*  BUN _1 CREATININE 1.38* 1.13 1.35*  CALCIUM 9.0 9.3 9.4  MG 2.0  --   --   PHOS 3.6  --   --    Liver Function Tests: Recent Labs  Lab 06/04/22 1026 06/05/22 0429  AST 30 27  ALT 19 19  ALKPHOS 82 75  BILITOT 0.7 0.5  PROT 7.1 6.9  ALBUMIN 3.3* 3.0*   CBG: Recent Labs  Lab 06/04/22 0738 06/04/22 1200 06/04/22 1642 06/04/22 2139 06/05/22 0733  GLUCAP 158* 232* 207* 200* 174*    Discharge time spent: less  than 30 minutes.  Signed: Marylu Lund, MD [facility] 06/05/2022

## 2022-06-04 NOTE — TOC Progression Note (Signed)
Transition of Care [facility]) - Progression Note    Patient Details  Name: Antonio Guerra MRN: [id number] Date of Birth: Jan 17, 1951  Transition of Care [facility]) CM/SW Contact  Purcell Mouton, RN Phone Number: 06/04/2022, 9:17 AM  Clinical Narrative:     [facility] CSW Brea reached out this am for updates on pt's discharge. Will continue to follow pt for discharge to [facility].   Expected Discharge Plan: Home/Self Care Barriers to Discharge: Continued Medical Work up  Expected Discharge Plan and Services Expected Discharge Plan: Home/Self Care   Discharge Planning Services: CM Consult   Living arrangements for the past 2 months: Apartment                                       Social Determinants of Health (SDOH) Interventions    Readmission Risk Interventions    05/20/2022    9:42 AM  Readmission Risk Prevention Plan  Transportation Screening Complete  Medication Review ([place]) Complete  PCP or Specialist appointment within 3-5 days of discharge Complete  HRI or [place] Not Applicable

## 2022-06-04 NOTE — Progress Notes (Signed)
  Progress Note   Patient: Antonio Guerra PJS:[id number] DOB: [birth year] DOA: 05/19/2022     16 DOS: the patient was seen and examined on 06/04/2022   Brief hospital course: 71 year old male with past medical history of Merkel Cell carcinoma, erosive esophagitis, CKD stage IIIb, diabetes type 2, chronic systolic heart failure, coronary artery disease presented to hospital from home with complaints of nausea vomiting and diarrhea.  Of note, he was recently admitted hospital for the same a week ago and at that time had EGD done which showed severe esophagitis.  He was discharged home on Carafate and Protonix but despite that his nausea and vomiting worsened after he was given chemotherapy infusion.  Patient was then admitted to hospital again for further management.   Assessment and Plan: Intractable nausea/vomiting/Ogilvie's syndrome:  Patient was recently admitted a week back for the same.   -Pt had been on Carafate and Protonix for severe esophagitis from previous admission -Serial xrays demonstrated evidence of ileus -GI following. Oncology following -Per GI, recs to continue aggressive bowel regimen including every other day tapwater enemas which will need to be continued moving forward -today noted to have pos BS on exam. Staff reports pt is tolerating some PO intake   Severe esophagitis:  Recent EGD diagnosis.  Continue PPI twice daily  No need to repeat EGD   AKI on CKD stage IIIa: Baseline creatinine ranges from 1.3-1.8 .currently creatinine at 1.13 and at baseline.   Merkel cell carcinoma:  Patient has firm mass in the right groin.  Follows up with oncology.  Oncology had recommended palliative care.  Family not interested in hospice yet.  Patient has poor prognosis.     Diabetes mellitus type 2:   Continue sliding scale insulin Accu-Cheks. Glycemic trends stable   Hyperlipidemia: Lipitor on hold.   Normocytic anemia: Hgb trends stable   Hypokalemia: Resolved.   Chronic  systolic congestive heart failure: Last 2D echocardiogram on 9/23 showed EF of 30 to 35%.   -Recently increased dose of carvedilol to 25 mg twice daily, now decreased to 12.'5mg'$  with hold parameters, per below -Continue Imdur as tolerated   Hypertension:  -Had been on Imdur, coreg, losartan -Noted to be hypotensive this AM with sbp into the 60's, improved with IVF bolus -Have d/c losartan and reduced coreg to 12.'5mg'$  with hold parameters   Deconditioning/debility:  Physical therapy has recommended skilled nursing facility placement.        Subjective: Not very verbal this AM  Physical Exam: Vitals:   06/04/22 1253 06/04/22 1337 06/04/22 1400 06/04/22 1602  BP: 91/65 101/60 102/62 105/71  Pulse:      Resp:      Temp:      TempSrc:      SpO2: 98%     Weight:      Height:       General exam: Conversant, in no acute distress Respiratory system: normal chest rise, clear, no audible wheezing Cardiovascular system: regular rhythm, s1-s2 Gastrointestinal system: Nondistended, nontender, positive BS Central nervous system: No seizures, no tremors Extremities: No cyanosis, no joint deformities Skin: No rashes, no pallor Psychiatry: Affect normal // no auditory hallucinations   Data Reviewed:  Labs reviewed: Na 135, K 4.5, Cr 1.13  Family Communication: Pt in room, family not at bedside  Disposition: Status is: Inpatient Remains inpatient appropriate because: Severity of illness  Planned Discharge Destination: Skilled nursing facility    Author: Marylu Lund, MD 06/04/2022 5:48 PM  For on call review [email or web address].

## 2022-06-04 NOTE — Progress Notes (Signed)
Nutrition Follow-up  DOCUMENTATION CODES:   Severe malnutrition in context of chronic illness  INTERVENTION:   -Boost Breeze po TID, each supplement provides 250 kcal and 9 grams of protein -pt's choice  -Ensure Plus High Protein po TID, each supplement provides 350 kcal and 20 grams of protein. -pt's choice  -Multivitamin with minerals daily  NUTRITION DIAGNOSIS:   Severe Malnutrition related to chronic illness, cancer and cancer related treatments as evidenced by percent weight loss, moderate fat depletion, severe muscle depletion.  Ongoing.  GOAL:   Patient will meet greater than or equal to 90% of their needs  Not meeting.  MONITOR:   PO intake, Supplement acceptance, Labs, Weight trends, I & O's  ASSESSMENT:   71 year old male with past medical history of Merkel Cell carcinoma, erosive esophagitis, CKD stage IIIb, diabetes type 2, chronic systolic heart failure, coronary artery disease presented to hospital from home with complaints of nausea vomiting and diarrhea.  Of note, he was recently admitted hospital for the same a week ago and at that time had EGD done which showed severe esophagitis.  He was discharged home on Carafate and Protonix but despite that his nausea and vomiting worsened after he was given chemotherapy infusion.  Patient currently consuming 0-25% of meals. Accepting Boost Breeze and Ensure. Per chart review, pt with poor prognosis. Plan is for discharge to SNF. Pt not a candidate for further chemo.  Admission weight: 164 lbs Current weight: 164 lbs  Medications: Reglan, Multivitamin with minerals daily, Miralax, Carafate  Labs reviewed:  CBGS: 151-240  Diet Order:   Diet Order             DIET SOFT Room service appropriate? Yes; Fluid consistency: Thin  Diet effective now                   EDUCATION NEEDS:   No education needs have been identified at this time  Skin:  Skin Assessment: Skin Integrity Issues: Skin Integrity  Issues:: Other (Comment) Other: left buttocks  Last BM:  11/13  Height:   Ht Readings from Last 1 Encounters:  05/21/22 '6\' 1"'$  (1.854 m)    Weight:   Wt Readings from Last 1 Encounters:  06/03/22 74.7 kg    BMI:  Body mass index is 21.73 kg/m.  Estimated Nutritional Needs:   Kcal:  4599-7741  Protein:  110-120g  Fluid:  2.2L/day  Clayton Bibles, MS, RD, LDN Inpatient Clinical Dietitian Contact information available via Amion

## 2022-06-04 NOTE — TOC Progression Note (Signed)
Transition of Care [facility]) - Progression Note    Patient Details  Name: Antonio Guerra MRN: [id number] Date of Birth: 02-27-1951  Transition of Care [facility]) CM/SW Contact  Purcell Mouton, RN Phone Number: 06/04/2022, 2:19 PM  Clinical Narrative:     Pt's BP was low, not stable for discharge today. May go to [facility] [employer] in the AM. Discharge Packet in pt's chart.   Expected Discharge Plan: Home/Self Care Barriers to Discharge: Continued Medical Work up  Expected Discharge Plan and Services Expected Discharge Plan: Home/Self Care   Discharge Planning Services: CM Consult   Living arrangements for the past 2 months: Apartment                                       Social Determinants of Health (SDOH) Interventions    Readmission Risk Interventions    05/20/2022    9:42 AM  Readmission Risk Prevention Plan  Transportation Screening Complete  Medication Review ([place]) Complete  PCP or Specialist appointment within 3-5 days of discharge Complete  HRI or [place] Not Applicable

## 2022-06-05 LAB — CBC
HCT: 22.1 % — ABNORMAL LOW (ref 39.0–52.0)
HCT: 30.4 % — ABNORMAL LOW (ref 39.0–52.0)
Hemoglobin: 7.4 g/dL — ABNORMAL LOW (ref 13.0–17.0)
Hemoglobin: 9.8 g/dL — ABNORMAL LOW (ref 13.0–17.0)
MCH: 27.7 pg (ref 26.0–34.0)
MCH: 27.5 pg (ref 26.0–34.0)
MCHC: 33.5 g/dL (ref 30.0–36.0)
MCHC: 32.2 g/dL (ref 30.0–36.0)
MCV: 82.8 fL (ref 80.0–100.0)
MCV: 85.2 fL (ref 80.0–100.0)
Platelets: 271 10*3/uL (ref 150–400)
Platelets: 252 10*3/uL (ref 150–400)
RBC: 2.67 MIL/uL — ABNORMAL LOW (ref 4.22–5.81)
RBC: 3.57 MIL/uL — ABNORMAL LOW (ref 4.22–5.81)
RDW: 14.9 % (ref 11.5–15.5)
RDW: 15.1 % (ref 11.5–15.5)
WBC: 4.2 10*3/uL (ref 4.0–10.5)
WBC: 4.4 10*3/uL (ref 4.0–10.5)
nRBC: 0 % (ref 0.0–0.2)
nRBC: 0 % (ref 0.0–0.2)

## 2022-06-05 LAB — COMPREHENSIVE METABOLIC PANEL
ALT: 19 U/L (ref 0–44)
AST: 27 U/L (ref 15–41)
Albumin: 3 g/dL — ABNORMAL LOW (ref 3.5–5.0)
Alkaline Phosphatase: 75 U/L (ref 38–126)
Anion gap: 6 (ref 5–15)
BUN: 19 mg/dL (ref 8–23)
CO2: 28 mmol/L (ref 22–32)
Calcium: 9.4 mg/dL (ref 8.9–10.3)
Chloride: 104 mmol/L (ref 98–111)
Creatinine, Ser: 1.35 mg/dL — ABNORMAL HIGH (ref 0.61–1.24)
GFR, Estimated: 56 mL/min — ABNORMAL LOW (ref >60)
Glucose, Bld: 173 mg/dL — ABNORMAL HIGH (ref 70–99)
Potassium: 4.7 mmol/L (ref 3.5–5.1)
Sodium: 138 mmol/L (ref 135–145)
Total Bilirubin: 0.5 mg/dL (ref 0.3–1.2)
Total Protein: 6.9 g/dL (ref 6.5–8.1)

## 2022-06-05 LAB — GLUCOSE, CAPILLARY
Glucose-Capillary: 200 mg/dL — ABNORMAL HIGH (ref 70–99)
Glucose-Capillary: 174 mg/dL — ABNORMAL HIGH (ref 70–99)
Glucose-Capillary: 209 mg/dL — ABNORMAL HIGH (ref 70–99)

## 2022-06-05 NOTE — TOC Transition Note (Signed)
Transition of Care [facility]) - CM/SW Discharge Note   Patient Details  Name: Antonio Guerra MRN: [id number] Date of Birth: Jul 08, 1951  Transition [facility] Care [facility]) CM/SW Contact:  Henrietta Dine, RN Phone Number: 06/05/2022, 10:25 AM   Clinical Narrative:    D/C orders for pt; spoke with Lexine Baton at [facility]; she gave RM # [phone] window and call report # [phone]; SNF transfer report and d/c summary sent via hub; spoke with Nira Conn, Transportation at [facility]; she says they will transport the pt to facility with pick up time 1100; pt's dtr [name] [phone]) notified; no TOC needs.     Barriers to Discharge: Continued Medical Work up   Patient Goals and CMS Choice Patient states their goals for this hospitalization and ongoing recovery are:: to go home      Discharge Placement                       Discharge Plan and Services   Discharge Planning Services: CM Consult                                 Social Determinants of Health (SDOH) Interventions     Readmission Risk Interventions    06/05/2022   10:25 AM 05/20/2022    9:42 AM  Readmission Risk Prevention Plan  Transportation Screening Complete Complete  Medication Review ([place]) Complete Complete  PCP or Specialist appointment within 3-5 days of discharge Complete Complete  HRI or Home Care Consult Complete Complete  SW Recovery Care/Counseling Consult Complete Complete  Palliative Care Screening Not [place] Complete Not Applicable

## 2022-06-08 ENCOUNTER — Telehealth: Payer: Self-pay | Admitting: *Deleted

## 2022-06-08 ENCOUNTER — Encounter (INDEPENDENT_AMBULATORY_CARE_PROVIDER_SITE_OTHER): Payer: Medicare (Managed Care) | Admitting: Ophthalmology

## 2022-06-08 NOTE — Telephone Encounter (Signed)
This RN spoke with Clemens Catholic NP at [facility] at [phone] per her call asking about plan post d/c.  Per Brittany-pt's dtr is wanting him to have more chemo - [place] is planning on having a family meeting later this week.  This RN discussed above including his diagnosis, with use of chemo more for palliative tumor regression-unfortunately due to pt's other issues - chemotherapy has not been able to be maintained for good response.  Plan at present now that the patient is discharged-is to see him in 2 weeks and re-assess his condition.  [place] will inform daughter of above as well as if she feels beneficial we can refer for a second opinion.

## 2022-06-09 ENCOUNTER — Other Ambulatory Visit (HOSPITAL_COMMUNITY): Payer: Self-pay

## 2022-06-09 ENCOUNTER — Telehealth: Payer: Self-pay | Admitting: Internal Medicine

## 2022-06-09 NOTE — Telephone Encounter (Signed)
Inbound call from jennifer with [facility] in regards to recommendations for patient. States patient is having complications.  Please advise

## 2022-06-10 ENCOUNTER — Other Ambulatory Visit (HOSPITAL_COMMUNITY): Payer: Self-pay

## 2022-06-10 ENCOUNTER — Telehealth: Payer: Self-pay | Admitting: *Deleted

## 2022-06-10 NOTE — Telephone Encounter (Signed)
This RN spoke with [place] NP from [facility] - she states she will be meeting with the patient's daughter next week and wanted to verify pt's current stage of cancer and prognosis.  This RN informed her pt is stage 3 which ideally in a healthy person is treated with chemo, radiation and surgery.  Unfortunately Antonio Guerra has multiple comorbid diagnosis and is very unhealthy - we have not been able to aggressively treat him, and even with lowered doses he has not been able to tolerate treatment or radiation. Surgery is presently not an option due to tumor size and pt's health status which gives him a poor prognosis.  Merkel cell tends to be aggressive in it's growth though presently pt does not have distant mets.  Goal is presently trying to maintain control of growth with minimal therapy for quality of life.  Plan is to see pt later next week and see how he is doing and see if we can resume therapy.  If pt's daughter feels a second opinion may be beneficial we are happy to refer.  This RN will contact [place] on next business day and inform her of appt.

## 2022-06-10 NOTE — Telephone Encounter (Signed)
Attempted to call Anderson Malta at [facility] back, the person that answered the phone did not know of a Anderson Malta there. Will await further communication regarding pt.

## 2022-06-12 ENCOUNTER — Other Ambulatory Visit (HOSPITAL_COMMUNITY): Payer: Self-pay

## 2022-06-16 ENCOUNTER — Encounter: Payer: Self-pay | Admitting: Hematology and Oncology

## 2022-06-16 ENCOUNTER — Other Ambulatory Visit: Payer: Self-pay | Admitting: Hematology and Oncology

## 2022-06-16 ENCOUNTER — Telehealth: Payer: Self-pay | Admitting: Hematology and Oncology

## 2022-06-16 NOTE — Telephone Encounter (Signed)
Contacted patient to scheduled appointments. Left message with appointment details and a call back number if patient had any questions or could not accommodate the time we provided.   

## 2022-06-16 NOTE — Progress Notes (Signed)
I called Ms Antonio Guerra to see how Mr Antonio Guerra is doing She says he is a little better. She wants to wait for a week or two to see how she is doing. She will call us next week and make a follow up.  Antonio Guerra

## 2022-06-24 ENCOUNTER — Emergency Department (HOSPITAL_COMMUNITY): Payer: Medicare (Managed Care)

## 2022-06-24 ENCOUNTER — Other Ambulatory Visit: Payer: Self-pay

## 2022-06-24 ENCOUNTER — Encounter (HOSPITAL_COMMUNITY): Payer: Self-pay

## 2022-06-24 ENCOUNTER — Emergency Department (HOSPITAL_COMMUNITY)
Admission: EM | Admit: 2022-06-24 | Discharge: 2022-06-25 | Disposition: A | Discharge status: Home / Self Care | Payer: Medicare (Managed Care) | Attending: Student | Admitting: Student

## 2022-06-24 DIAGNOSIS — E875 Hyperkalemia: Secondary | ICD-10-CM | POA: Diagnosis not present

## 2022-06-24 DIAGNOSIS — M79661 Pain in right lower leg: Secondary | ICD-10-CM | POA: Diagnosis not present

## 2022-06-24 DIAGNOSIS — Y92003 Bedroom of unspecified non-institutional (private) residence as the place of occurrence of the external cause: Secondary | ICD-10-CM | POA: Insufficient documentation

## 2022-06-24 DIAGNOSIS — I130 Hypertensive heart and chronic kidney disease with heart failure and stage 1 through stage 4 chronic kidney disease, or unspecified chronic kidney disease: Secondary | ICD-10-CM | POA: Diagnosis not present

## 2022-06-24 DIAGNOSIS — J690 Pneumonitis due to inhalation of food and vomit: Secondary | ICD-10-CM | POA: Insufficient documentation

## 2022-06-24 DIAGNOSIS — I2510 Atherosclerotic heart disease of native coronary artery without angina pectoris: Secondary | ICD-10-CM | POA: Insufficient documentation

## 2022-06-24 DIAGNOSIS — Z87891 Personal history of nicotine dependence: Secondary | ICD-10-CM | POA: Insufficient documentation

## 2022-06-24 DIAGNOSIS — Z7902 Long term (current) use of antithrombotics/antiplatelets: Secondary | ICD-10-CM | POA: Diagnosis not present

## 2022-06-24 DIAGNOSIS — W06XXXA Fall from bed, initial encounter: Secondary | ICD-10-CM | POA: Diagnosis not present

## 2022-06-24 DIAGNOSIS — S0990XA Unspecified injury of head, initial encounter: Secondary | ICD-10-CM | POA: Diagnosis present

## 2022-06-24 DIAGNOSIS — R109 Unspecified abdominal pain: Secondary | ICD-10-CM | POA: Diagnosis not present

## 2022-06-24 DIAGNOSIS — Z794 Long term (current) use of insulin: Secondary | ICD-10-CM | POA: Diagnosis not present

## 2022-06-24 DIAGNOSIS — E860 Dehydration: Secondary | ICD-10-CM | POA: Diagnosis not present

## 2022-06-24 DIAGNOSIS — I5043 Acute on chronic combined systolic (congestive) and diastolic (congestive) heart failure: Secondary | ICD-10-CM | POA: Diagnosis not present

## 2022-06-24 DIAGNOSIS — S199XXA Unspecified injury of neck, initial encounter: Secondary | ICD-10-CM | POA: Diagnosis not present

## 2022-06-24 DIAGNOSIS — C4A9 Merkel cell carcinoma, unspecified: Secondary | ICD-10-CM | POA: Insufficient documentation

## 2022-06-24 DIAGNOSIS — N1831 Chronic kidney disease, stage 3a: Secondary | ICD-10-CM | POA: Diagnosis not present

## 2022-06-24 DIAGNOSIS — Z955 Presence of coronary angioplasty implant and graft: Secondary | ICD-10-CM | POA: Diagnosis not present

## 2022-06-24 DIAGNOSIS — W19XXXA Unspecified fall, initial encounter: Secondary | ICD-10-CM

## 2022-06-24 LAB — CBC WITH DIFFERENTIAL/PLATELET
Abs Immature Granulocytes: 0.01 10*3/uL (ref 0.00–0.07)
Basophils Absolute: 0 10*3/uL (ref 0.0–0.1)
Basophils Relative: 1 %
Eosinophils Absolute: 0 10*3/uL (ref 0.0–0.5)
Eosinophils Relative: 1 %
HCT: 34.7 % — ABNORMAL LOW (ref 39.0–52.0)
Hemoglobin: 11.2 g/dL — ABNORMAL LOW (ref 13.0–17.0)
Immature Granulocytes: 0 %
Lymphocytes Relative: 29 %
Lymphs Abs: 1.4 10*3/uL (ref 0.7–4.0)
MCH: 27.3 pg (ref 26.0–34.0)
MCHC: 32.3 g/dL (ref 30.0–36.0)
MCV: 84.6 fL (ref 80.0–100.0)
Monocytes Absolute: 0.6 10*3/uL (ref 0.1–1.0)
Monocytes Relative: 12 %
Neutro Abs: 2.8 10*3/uL (ref 1.7–7.7)
Neutrophils Relative %: 57 %
Platelets: 322 10*3/uL (ref 150–400)
RBC: 4.1 MIL/uL — ABNORMAL LOW (ref 4.22–5.81)
RDW: 14.3 % (ref 11.5–15.5)
WBC: 4.9 10*3/uL (ref 4.0–10.5)
nRBC: 0 % (ref 0.0–0.2)

## 2022-06-24 LAB — COMPREHENSIVE METABOLIC PANEL
ALT: 16 U/L (ref 0–44)
AST: 39 U/L (ref 15–41)
Albumin: 3 g/dL — ABNORMAL LOW (ref 3.5–5.0)
Alkaline Phosphatase: 90 U/L (ref 38–126)
Anion gap: 8 (ref 5–15)
BUN: 23 mg/dL (ref 8–23)
CO2: 26 mmol/L (ref 22–32)
Calcium: 9.1 mg/dL (ref 8.9–10.3)
Chloride: 101 mmol/L (ref 98–111)
Creatinine, Ser: 1.76 mg/dL — ABNORMAL HIGH (ref 0.61–1.24)
GFR, Estimated: 41 mL/min — ABNORMAL LOW (ref >60)
Glucose, Bld: 124 mg/dL — ABNORMAL HIGH (ref 70–99)
Potassium: 5.4 mmol/L — ABNORMAL HIGH (ref 3.5–5.1)
Sodium: 135 mmol/L (ref 135–145)
Total Bilirubin: 1 mg/dL (ref 0.3–1.2)
Total Protein: 7.3 g/dL (ref 6.5–8.1)

## 2022-06-24 MED ORDER — LACTATED RINGERS IV BOLUS
500.0000 mL | Freq: Once | INTRAVENOUS | Status: AC
  Administered 2022-06-24: 500 mL via INTRAVENOUS

## 2022-06-24 MED ORDER — AMOXICILLIN-POT CLAVULANATE 875-125 MG PO TABS
1.0000 | ORAL_TABLET | Freq: Two times a day (BID) | ORAL | 0 refills | Status: AC
  Filled 2022-06-24: qty 14, 7d supply, fill #0

## 2022-06-24 MED ORDER — SODIUM ZIRCONIUM CYCLOSILICATE 5 G PO PACK
5.0000 g | PACK | Freq: Once | ORAL | Status: AC
  Administered 2022-06-25: 5 g via ORAL
  Filled 2022-06-24: qty 1

## 2022-06-24 MED ORDER — IOHEXOL 300 MG/ML  SOLN
85.0000 mL | Freq: Once | INTRAMUSCULAR | Status: AC | PRN
  Administered 2022-06-24: 85 mL via INTRAVENOUS

## 2022-06-24 MED ORDER — LACTATED RINGERS IV BOLUS: 1000.0000 mL | Freq: Once | INTRAVENOUS | Status: DC

## 2022-06-24 MED ORDER — FENTANYL CITRATE PF 50 MCG/ML IJ SOSY
50.0000 ug | PREFILLED_SYRINGE | Freq: Once | INTRAMUSCULAR | Status: AC
  Administered 2022-06-24: 50 ug via INTRAVENOUS
  Filled 2022-06-24: qty 1

## 2022-06-24 NOTE — ED Triage Notes (Signed)
Patient fell from bed, has a hematoma to the back of his skull, complaining of head and neck pain. Denies LOC, and BT

## 2022-06-24 NOTE — ED Notes (Signed)
Pt needs labs for CT please

## 2022-06-24 NOTE — ED Provider Notes (Signed)
[facility] DEPT Provider Note  CSN: [id number] Arrival date & time: 06/24/22 1912  Chief Complaint(s) Fall, Head Injury, and Neck Injury  HPI Antonio Guerra is a 71 y.o. male with PMH Merkel cell carcinoma, erosive esophagitis, CKD 3, T2DM, chronic CHF, CAD, Ogilvie syndrome who presents emergency department for evaluation of a fall.  Patient states that he has not been able to tolerate p.o. well for the last 2 days and had a fall 1970 2 hours ago and an additional fall today at his facility.  He states that he fell out of bed.  He currently endorses right lower extremity pain worse at the hip.  Patient arrives in a c-collar but the c-collar is not on his neck and is at the level of the orbits.  He has full range of motion of the neck with no numbness, tingling, weakness.   Past Medical History Past Medical History:  Diagnosis Date   Altered mental status    Arthritis    "all over" (12/20/2015)   Chronic lower back pain    Chronic systolic congestive heart failure ([place])    a. 04/2014 Echo: EF 20-25%.   Coronary artery disease    a. 2011 MI x 2 with PCI: stent x 2 (LAD and RI) @ [facility] in [place], [place];  b. 04/2014 Cath/PCI: LM 10-20, LAD 40p, 65m 875mSR(3.5x38 Xience DES), 50apical, LCX 20 diffuse, RI 50p, 2091mR, RCA 40-41m61md.   Fibromyalgia    Hyperlipidemia    Hypertension    Hypertensive urgency 09/27/2015   [facility]   [facility]   "right hand weaker since" (12/20/2015)   Type II diabetes mellitus (HCC)[place] Patient Active Problem List   Diagnosis Date Noted   Abdominal distention 06/03/2022   Protein-calorie malnutrition, severe 05/28/2022   Abnormal CT scan, gastrointestinal tract 05/27/2022   Intractable nausea and vomiting 05/19/2022   Ileus (HCC)[place]/31/2023   Normocytic anemia 05/19/2022   Esophagitis, [place] grade D 05/14/2022   Acute kidney injury superimposed on chronic kidney disease (HCC)     Nausea and vomiting    Dehydration    Acute esophagitis    Gastritis without bleeding    Abnormal CT scan, colon    AKI (acute kidney injury) (HCC)[place]/23/2023   Merkel cell carcinoma (HCC)[place]/05/2022   Merkel cell carcinoma nodal presentation (HCC)[place]/31/2023   Inguinal lymphadenopathy 01/26/2022   Stage 3a chronic kidney disease (HCC)[place]/21/2021   History of angina 11/08/2019   Intermittent diarrhea 11/08/2019   Type 2 diabetes mellitus with vascular disease (HCC)[place]/07/2015   Alcohol intoxication (HCC)[place]/07/2015   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 Authorizing Provider  amoxicillin-clavulanate (AUGMENTIN) 875-125 MG tablet Take 1 tablet by mouth every 12 (twelve) hours. 06/24/22  Yes Jandy Brackens, MD  acetaminophen (TYLENOL) 650 MG CR tablet Take 650 mg by mouth every 8 (eight) hours as needed for pain.    [provider]  alum & mag hydroxide-simeth (MAALOX/MYLANTA) 200-200-20 MG/5ML suspension Take  15 mLs by mouth every 4 (four) hours as needed for indigestion or heartburn.    [provider]  bisacodyl (DULCOLAX) 10 MG suppository Place 1 suppository (10 mg total) rectally daily. 06/05/22 07/05/22  Donne Hazel, MD  Blood Glucose Monitoring Suppl (ONETOUCH VERIO) w/Device KIT 30 Units by Does not apply route daily. 11/02/17   Diallo, Earna Coder, MD  carvedilol (COREG) 12.5 MG tablet Take 12.5 mg by mouth 2 (two) times daily with a meal.    [provider]  clopidogrel (PLAVIX) 75 MG tablet TAKE 1 TABLET(75 MG)  BY MOUTH DAILY Patient taking differently: Take 75 mg by mouth daily. 01/29/21   [name], MD  Continuous Blood Gluc Receiver (FREESTYLE LIBRE 14 DAY READER) DEVI 1 Device by Does not apply route every morning. 11/08/19   Bonnita Hollow, MD  cycloSPORINE (RESTASIS) 0.05 % ophthalmic emulsion Place 1 drop into both eyes 2 (two) times daily.    [provider]  Dulaglutide (TRULICITY) 7.12 WP/8.0DX SOPN Inject 0.75 mg into the skin once a week. Friday or saturday    [provider]  gabapentin (NEURONTIN) 400 MG capsule TAKE 1 CAPSULE BY MOUTH TWICE DAILY Patient taking differently: Take 400 mg by mouth 2 (two) times daily. 02/20/21   [name], MD  Incontinence Supply Disposable (DEPEND ADJUSTABLE UNDERWEAR LG) MISC 1 application by Does not apply route as needed. 08/09/20   [name], MD  Insulin Pen Needle (BD PEN NEEDLE NANO U/F) 32G X 4 MM MISC AS DIRECTED TWICE DAILY 02/24/21   [name], MD  isosorbide mononitrate (ISMO) 10 MG tablet Take 10 mg by mouth daily.    [provider]  Lancets [facility] ULTRASOFT) lancets USE AS DIRECTED 08/09/20   [name], MD  lidocaine (SALONPAS PAIN RELIEVING) 4 % Place 1 patch onto the skin daily as needed (pain).    [provider]  lidocaine-prilocaine (EMLA) cream Apply to port site at least 1 hour prior to use 05/19/22   Iruku, Arletha Pili, MD  magnesium hydroxide (MILK OF MAGNESIA) 400 MG/5ML suspension Take 30 mLs by mouth daily. 06/05/22   Donne Hazel, MD  metoCLOPramide (REGLAN) 10 MG tablet Take 1 tablet (10 mg total) by mouth every 6 (six) hours as needed for up to 20 days for nausea. 05/04/22 05/24/22  Fransico Meadow, MD  nitroGLYCERIN (NITROSTAT) 0.4 MG SL tablet PLACE 1 TABLET UNDER THE TONGUE EVERY 5 MINS AS NEEDED FOR CHEST PAIN Patient taking differently: Place 0.4 mg under the tongue every 5 (five) minutes as needed for chest pain. 04/08/21   [name], MD  ondansetron (ZOFRAN) 8 MG tablet  Take 1 tablet (8 mg total) by mouth every 8 (eight) hours as needed for nausea or vomiting. Start on third day after chemotherapy. 05/13/22   Benay Pike, MD  ONETOUCH VERIO test strip USE TO TEST BLOOD SUGAR 2 TO 3 TIMES A DAY 04/09/21   [name], MD  oxyCODONE (OXY IR/ROXICODONE) 5 MG immediate release tablet Take 1 tablet (5 mg total) by mouth every 6 (six) hours as needed for moderate pain. 06/04/22   Donne Hazel, MD  pantoprazole (PROTONIX) 40 MG tablet Take 1 tablet (40 mg total) by mouth 2 (two) times daily. 06/04/22 07/04/22  Donne Hazel, MD  polyethylene glycol (MIRALAX / GLYCOLAX) 17 g packet Take 17 g by mouth 2 (two) times daily. 06/04/22 07/04/22  Donne Hazel, MD  prochlorperazine (COMPAZINE) 10 MG tablet Take 1 tablet (10  mg total) by mouth every 6 (six) hours as needed for nausea or vomiting (Nausea or vomiting). 05/13/22   Benay Pike, MD  sucralfate (CARAFATE) 1 g tablet Take 1 tablet (1 g total) by mouth 4 (four) times daily -  with meals and at bedtime for 7 days. May use longer if needed. 05/15/22 05/22/22  Eugenie Filler, MD                                                                                                                                    Past Surgical History Past Surgical History:  Procedure Laterality Date   ANKLE FRACTURE SURGERY Right    BIOPSY  05/13/2022   Procedure: BIOPSY;  Surgeon: Jerene Bears, MD;  Location: [facility];  Service: Gastroenterology;;  egd with biopsies   CARDIAC CATHETERIZATION N/A 09/30/2015   Procedure: Left Heart Cath and Coronary Angiography;  Surgeon: Burnell Blanks, MD;  Location: [facility] CV LAB;  Service: Cardiovascular;  Laterality: N/A;   CORONARY ANGIOPLASTY WITH STENT PLACEMENT     "total of 3 stents" (12/20/2015)   ESOPHAGOGASTRODUODENOSCOPY (EGD) WITH PROPOFOL N/A 05/13/2022   Procedure: ESOPHAGOGASTRODUODENOSCOPY (EGD) WITH PROPOFOL;  Surgeon: Jerene Bears, MD;  Location: [facility]  ENDOSCOPY;  Service: Gastroenterology;  Laterality: N/A;   FLEXIBLE SIGMOIDOSCOPY N/A 05/13/2022   Procedure: FLEXIBLE SIGMOIDOSCOPY;  Surgeon: Jerene Bears, MD;  Location: [facility] ENDOSCOPY;  Service: Gastroenterology;  Laterality: N/A;   FRACTIONAL FLOW RESERVE WIRE  05/11/2014   Procedure: FRACTIONAL FLOW RESERVE WIRE;  Surgeon: Burnell Blanks, MD;  Location: [facility] CATH LAB;  Service: Cardiovascular;;   FRACTURE SURGERY     LEFT HEART CATHETERIZATION WITH CORONARY/GRAFT ANGIOGRAM N/A 05/11/2014   Procedure: LEFT HEART CATHETERIZATION WITH Beatrix Fetters;  Surgeon: Burnell Blanks, MD;  Location: [facility] CATH LAB;  Service: Cardiovascular;  Laterality: N/A;   PERCUTANEOUS CORONARY STENT INTERVENTION (PCI-S)  05/11/2014   Procedure: PERCUTANEOUS CORONARY STENT INTERVENTION (PCI-S);  Surgeon: Burnell Blanks, MD;  Location: [facility] CATH LAB;  Service: Cardiovascular;;   Family History Family History  Problem Relation Age of Onset   Diabetes Mother    CAD Neg Hx    Stomach cancer Neg Hx    Colon cancer Neg Hx    Esophageal cancer Neg Hx    Pancreatic cancer Neg Hx     Social History Social History   Tobacco Use   Smoking status: Former   Smokeless tobacco: Never   Tobacco comments:    12/20/2015 "smoked cigarettes 30-36yrago"  Vaping Use   Vaping Use: Never used  Substance Use Topics   Alcohol use: Not Currently    Alcohol/week: 5.0 standard drinks of alcohol    Types: 5 Cans of beer per week    Comment: stopped 4 yrs ago    Drug use: No   Allergies Lisinopril and Tape  Review of Systems Review of Systems  Gastrointestinal:  Positive  for abdominal pain.  Musculoskeletal:  Positive for arthralgias and myalgias.  Neurological:  Positive for headaches.    Physical Exam Vital Signs  I have reviewed the triage vital signs BP (!) 138/93   Pulse 90   Temp 98.4 F (36.9 C)   Resp 18   Ht 6' 1" (1.854 m)   Wt 72.4 kg Comment: per facility  SpO2 100%    BMI 21.06 kg/m   Physical Exam Constitutional:      General: He is not in acute distress.    Appearance: Normal appearance.  HENT:     Head: Normocephalic and atraumatic.     Nose: No congestion or rhinorrhea.  Eyes:     General:        Right eye: No discharge.        Left eye: No discharge.     Extraocular Movements: Extraocular movements intact.     Pupils: Pupils are equal, round, and reactive to light.  Cardiovascular:     Rate and Rhythm: Normal rate and regular rhythm.     Heart sounds: No murmur heard. Pulmonary:     Effort: No respiratory distress.     Breath sounds: No wheezing or rales.  Abdominal:     General: There is no distension.     Tenderness: There is abdominal tenderness.  Musculoskeletal:        General: Swelling, tenderness and deformity present. Normal range of motion.     Cervical back: Normal range of motion.  Skin:    General: Skin is warm and dry.  Neurological:     General: No focal deficit present.     Mental Status: He is alert.     ED Results and Treatments Labs (all labs ordered are listed, but only abnormal results are displayed) Labs Reviewed  COMPREHENSIVE METABOLIC PANEL - Abnormal; Notable for the following components:      Result Value   Potassium 5.4 (*)    Glucose, Bld 124 (*)    Creatinine, Ser 1.76 (*)    Albumin 3.0 (*)    GFR, Estimated 41 (*)    All other components within normal limits  CBC WITH DIFFERENTIAL/PLATELET - Abnormal; Notable for the following components:   RBC 4.10 (*)    Hemoglobin 11.2 (*)    HCT 34.7 (*)    All other components within normal limits  URINALYSIS, ROUTINE W REFLEX MICROSCOPIC                                                                                                                          Radiology CT ABDOMEN PELVIS W CONTRAST  Result Date: 06/24/2022 CLINICAL DATA:  Blunt abdominal trauma, fall EXAM: CT ABDOMEN AND PELVIS WITH CONTRAST TECHNIQUE: Multidetector CT imaging of the  abdomen and pelvis was performed using the standard protocol following bolus administration of intravenous contrast. RADIATION DOSE REDUCTION: This exam was performed according to the departmental dose-optimization program which includes automated exposure control, adjustment of  the mA and/or kV according to patient size and/or use of iterative reconstruction technique. CONTRAST:  47m OMNIPAQUE IOHEXOL 300 MG/ML  SOLN COMPARISON:  None Available. FINDINGS: Lower chest: Reticular and ground-glass infiltrate within the lung bases bilaterally, right greater than left, appears new from remote prior examination of 05/11/2022 in is likely infectious or inflammatory in nature. No pleural effusion. Evidence of prior coronary artery stenting. Calcification of the aortic valve leaflets partially visualized. Global cardiac size within normal limits. Small hiatal hernia. Distal esophagus is patulous. Hepatobiliary: No focal liver abnormality is seen. No gallstones, gallbladder wall thickening, or biliary dilatation. Pancreas: Unremarkable Spleen: Unremarkable Adrenals/Urinary Tract: The adrenal glands are unremarkable. The kidneys are normal in size and position. Multiple 1-2 mm punctate nonobstructing calculi are seen within the right kidney. No ureteral calculi. No hydronephrosis. Multiple cortical hypodensities are again seen within the kidneys bilaterally which are too small to characterize but likely represent multiple tiny cortical cysts. No follow-up imaging is recommended for these lesions. The bladder is mildly distended, but is otherwise unremarkable. Stomach/Bowel: Previously noted mildly dilated fluid-filled loops of bowel within the a right mid abdomen have resolved. The stomach, small bowel, and large bowel are otherwise unremarkable. No evidence of obstruction or focal inflammation. The appendix is not clearly identified and may be absent. No free intraperitoneal gas or fluid. Vascular/Lymphatic: Moderate  aortoiliac atherosclerotic calcification noted. No aortic aneurysm. Multilobulated heterogeneously enhancing soft tissue mass again seen within the subcutaneous right inguinal region appears enlarged since prior examination, now measuring 7.2 x 9.1 cm at axial image # 98/3. Progressive, pathologic right external and common iliac adenopathy is seen with the index common iliac lymph node measuring 21 mm in short axis diameter, previously measuring 13 mm. Pathologic adenopathy results in marked extrinsic compression of the right external iliac vein, axial image # 86/3, though this vessel appears patent. Reproductive: Mild prostatic enlargement. Other: Tiny fat containing umbilical hernia. Moderate asymmetric subcutaneous edema within the right lower extremity. Musculoskeletal: No acute bone abnormality. No lytic or blastic bone lesion. IMPRESSION: 1. No acute intra-abdominal pathology identified. No definite radiographic explanation for the patient's reported symptoms. 2. Interval enlargement of a multilobulated heterogeneously enhancing soft tissue mass within the subcutaneous right inguinal region, now measuring 9.1 cm in greatest dimension, with progressive pathologic right external and common iliac adenopathy. Pathologic adenopathy results in marked extrinsic compression of the right external iliac vein, though this vessel appears patent. Moderate asymmetric subcutaneous edema within the right lower extremity. 3. Interval resolution of previously noted mildly dilated fluid-filled loops of bowel within the right mid abdomen. 4. Mild right nonobstructing nephrolithiasis. 5. Mild prostatic enlargement. 6. Reticular and ground-glass infiltrate within the lung bases bilaterally, right greater than left, new from remote prior examination of 05/11/2022, likely infectious or inflammatory in nature. 7.  Aortic Atherosclerosis (ICD10-I70.0). Electronically Signed   By: AFidela SalisburyM.D.   On: 06/24/2022 22:14   CT HEAD  WO CONTRAST (5MM)  Result Date: 06/24/2022 CLINICAL DATA:  Trauma. EXAM: CT HEAD WITHOUT CONTRAST CT CERVICAL SPINE WITHOUT CONTRAST TECHNIQUE: Multidetector CT imaging of the head and cervical spine was performed following the standard protocol without intravenous contrast. Multiplanar CT image reconstructions of the cervical spine were also generated. RADIATION DOSE REDUCTION: This exam was performed according to the departmental dose-optimization program which includes automated exposure control, adjustment of the mA and/or kV according to patient size and/or use of iterative reconstruction technique. COMPARISON:  None Available. FINDINGS: CT HEAD FINDINGS Brain: Moderate age-related atrophy  and chronic microvascular ischemic changes. Old left MCA territory infarct and encephalomalacia. Small left cerebellar and right thalamic old infarcts. There is no acute intracranial hemorrhage. No mass effect or midline shift no extra-axial fluid collection. Vascular: No hyperdense vessel or unexpected calcification. Skull: Normal. Negative for fracture or focal lesion. Sinuses/Orbits: There is opacification of several ethmoid air cells. No air-fluid level. The mastoid air cells are clear. Other: None CT CERVICAL SPINE FINDINGS Alignment: No acute subluxation. There is straightening of normal cervical lordosis which may be positional or due to muscle spasm. Skull base and vertebrae: No acute fracture. Soft tissues and spinal canal: No prevertebral fluid or swelling. No visible canal hematoma. Disc levels:  No acute findings.  Degenerative changes. Upper chest: Negative. Other: Bilateral carotid bulb calcified plaque. IMPRESSION: 1. No acute intracranial pathology. Moderate age-related atrophy and chronic microvascular ischemic changes. Old left MCA territory infarct and encephalomalacia. 2. No acute/traumatic cervical spine pathology. Electronically Signed   By: Anner Crete M.D.   On: 06/24/2022 21:55   CT Cervical  Spine Wo Contrast  Result Date: 06/24/2022 CLINICAL DATA:  Trauma. EXAM: CT HEAD WITHOUT CONTRAST CT CERVICAL SPINE WITHOUT CONTRAST TECHNIQUE: Multidetector CT imaging of the head and cervical spine was performed following the standard protocol without intravenous contrast. Multiplanar CT image reconstructions of the cervical spine were also generated. RADIATION DOSE REDUCTION: This exam was performed according to the departmental dose-optimization program which includes automated exposure control, adjustment of the mA and/or kV according to patient size and/or use of iterative reconstruction technique. COMPARISON:  None Available. FINDINGS: CT HEAD FINDINGS Brain: Moderate age-related atrophy and chronic microvascular ischemic changes. Old left MCA territory infarct and encephalomalacia. Small left cerebellar and right thalamic old infarcts. There is no acute intracranial hemorrhage. No mass effect or midline shift no extra-axial fluid collection. Vascular: No hyperdense vessel or unexpected calcification. Skull: Normal. Negative for fracture or focal lesion. Sinuses/Orbits: There is opacification of several ethmoid air cells. No air-fluid level. The mastoid air cells are clear. Other: None CT CERVICAL SPINE FINDINGS Alignment: No acute subluxation. There is straightening of normal cervical lordosis which may be positional or due to muscle spasm. Skull base and vertebrae: No acute fracture. Soft tissues and spinal canal: No prevertebral fluid or swelling. No visible canal hematoma. Disc levels:  No acute findings.  Degenerative changes. Upper chest: Negative. Other: Bilateral carotid bulb calcified plaque. IMPRESSION: 1. No acute intracranial pathology. Moderate age-related atrophy and chronic microvascular ischemic changes. Old left MCA territory infarct and encephalomalacia. 2. No acute/traumatic cervical spine pathology. Electronically Signed   By: Anner Crete M.D.   On: 06/24/2022 21:55   DG Chest  Portable 1 View  Result Date: 06/24/2022 CLINICAL DATA:  Status post fall. EXAM: PORTABLE CHEST 1 VIEW COMPARISON:  May 19, 2009 3 FINDINGS: The heart size and mediastinal contours are within normal limits. Both lungs are clear. The visualized skeletal structures are unremarkable. IMPRESSION: No active disease. Electronically Signed   By: Virgina Norfolk M.D.   On: 06/24/2022 20:11   DG Hip Unilat W or Wo Pelvis 2-3 Views Right  Result Date: 06/24/2022 CLINICAL DATA:  Status post fall. EXAM: DG HIP (WITH OR WITHOUT PELVIS) 2-3V RIGHT COMPARISON:  None Available. FINDINGS: There is no evidence of hip fracture or dislocation. There is no evidence of arthropathy or other focal bone abnormality. Marked severity vascular calcification is noted. IMPRESSION: No acute osseous abnormality. Electronically Signed   By: Virgina Norfolk M.D.   On: 06/24/2022  20:10    Pertinent labs & imaging results that were available during my care of the patient were reviewed by me and considered in my medical decision making (see MDM for details).  Medications Ordered in ED Medications  fentaNYL (SUBLIMAZE) injection 50 mcg (50 mcg Intravenous Given 06/24/22 2022)  lactated ringers bolus 500 mL (0 mLs Intravenous Stopped 06/24/22 2209)  iohexol (OMNIPAQUE) 300 MG/ML solution 85 mL (85 mLs Intravenous Contrast Given 06/24/22 2136)  lactated ringers bolus 500 mL (500 mLs Intravenous New Bag/Given 06/24/22 2210)                                                                                                                                     Procedures .Critical Care  Performed by: Teressa Lower, MD Authorized by: Teressa Lower, MD   Critical care provider statement:    Critical care time (minutes):  30   Critical care was necessary to treat or prevent imminent or life-threatening deterioration of the following conditions:  Dehydration   Critical care was time spent personally by me on the following activities:   Development of treatment plan with patient or surrogate, discussions with consultants, evaluation of patient's response to treatment, examination of patient, ordering and review of laboratory studies, ordering and review of radiographic studies, ordering and performing treatments and interventions, pulse oximetry, re-evaluation of patient's condition and review of old charts   (including critical care time)  Medical Decision Making / ED Course   This patient presents to the ED for concern of fall, this involves an extensive number of treatment options, and is a complaint that carries with it a high risk of complications and morbidity.  The differential diagnosis includes fracture, hematoma, contusion, intra-abdominal injury, electrolyte abnormality  MDM: Patient seen emergency room for evaluation of a fall.  Physical exam with tenderness over the right hip near the site of a known Merkel cell lymphoma.  Palpable mass visible.  No appreciable C-spine tenderness to palpation and patient clinically cleared from c-collar standpoint.  Chest x-ray and hip x-ray negative for acute fracture.  CT head and C-spine with no acute intracranial pathology,  old left MCA stroke with encephalomalacia.  CT abdomen pelvis obtained that shows no acute intra-abdominal pathology but does show interval enlargement of the patient's heterogenously enhancing soft tissue mass in the right inguinal canal now 9.1 cm with worsening right external and common iliac adenopathy with compression of the right external iliac vein but venous imaging does show that the vein is patent.  There is also reticular and groundglass infiltrates in the lung bases and given patient's history of gastritis and aspiration we will cover outpatient with Augmentin but patient has no hypoxia no shortness of breath here in the emergency department and does not meet inpatient criteria for admission at this time.  Laboratory evaluation with mild hyperkalemia 5.4  for which Lokelma was given.  Patient has a very mild creatinine  elevation to 1.76 and he was given 2 500 cc boluses.  Hemoglobin 11.2 but laboratory evaluation otherwise unremarkable.  Patient currently does not meet inpatient criteria for admission and [facility] are showing that he does not have many treatment options available to him in the setting of his Merkel cell lymphoma and additional therapies for him will likely be palliative in nature.  Looking at a note from 06/10/2022 it appears the patient's goals of care are to maintain control of growth with minimal therapy for quality of life and thus inpatient hospitalization would not be beneficial as patient this time.  I sent message to his primary care physician to help arrange follow-up and patient was discharged with outpatient follow-up.   Additional history obtained:  -External records from outside source obtained and reviewed including: Chart review including previous notes, labs, imaging, consultation notes   Lab Tests: -I ordered, reviewed, and interpreted labs.   The pertinent results include:   Labs Reviewed  COMPREHENSIVE METABOLIC PANEL - Abnormal; Notable for the following components:      Result Value   Potassium 5.4 (*)    Glucose, Bld 124 (*)    Creatinine, Ser 1.76 (*)    Albumin 3.0 (*)    GFR, Estimated 41 (*)    All other components within normal limits  CBC WITH DIFFERENTIAL/PLATELET - Abnormal; Notable for the following components:   RBC 4.10 (*)    Hemoglobin 11.2 (*)    HCT 34.7 (*)    All other components within normal limits  URINALYSIS, ROUTINE W REFLEX MICROSCOPIC     Imaging Studies ordered: I ordered imaging studies including CT head, C-spine, abdomen pelvis, hip x-ray, chest x-ray I independently visualized and interpreted imaging. I agree with the radiologist interpretation   Medicines ordered and prescription drug management: Meds ordered this encounter  Medications    fentaNYL (SUBLIMAZE) injection 50 mcg   DISCONTD: lactated ringers bolus 1,000 mL   lactated ringers bolus 500 mL   iohexol (OMNIPAQUE) 300 MG/ML solution 85 mL   lactated ringers bolus 500 mL   amoxicillin-clavulanate (AUGMENTIN) 875-125 MG tablet    Sig: Take 1 tablet by mouth every 12 (twelve) hours.    Dispense:  14 tablet    Refill:  0    -I have reviewed the patients home medicines and have made adjustments as needed  Critical interventions Fluid resuscitation   Cardiac Monitoring: The patient was maintained on a cardiac monitor.  I personally viewed and interpreted the cardiac monitored which showed an underlying rhythm of: NSR  Social Determinants of Health:  Factors impacting patients care include: none   Reevaluation: After the interventions noted above, I reevaluated the patient and found that they have :improved  Co morbidities that complicate the patient evaluation  Past Medical History:  Diagnosis Date   Altered mental status    Arthritis    "all over" (12/20/2015)   Chronic lower back pain    Chronic systolic congestive heart failure ([place])    a. 04/2014 Echo: EF 20-25%.   Coronary artery disease    a. 2011 MI x 2 with PCI: stent x 2 (LAD and RI) @ [facility] in [place], [place];  b. 04/2014 Cath/PCI: LM 10-20, LAD 40p, 53m 84mSR(3.5x38 Xience DES), 50apical, LCX 20 diffuse, RI 50p, 2060mR, RCA 40-64m64md.   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

## 2022-06-25 ENCOUNTER — Other Ambulatory Visit (HOSPITAL_COMMUNITY): Payer: Self-pay

## 2022-06-25 ENCOUNTER — Encounter: Payer: Self-pay | Admitting: Hematology and Oncology

## 2022-06-25 NOTE — ED Notes (Signed)
Spoke with the patients daughter to notify that he was here, and will being going back to the facility.

## 2022-06-29 ENCOUNTER — Telehealth: Payer: Self-pay | Admitting: Hematology and Oncology

## 2022-06-29 NOTE — Telephone Encounter (Signed)
I called Ms Clemens Catholic again. He apparently is not doing well, has falls, then had aspiration pneumonia. Ileus seems to have gotten better According to her he has lost 20 lbs of weight and is very debilitated. His most recent imaging showed progression. It appears they will be moving forward with home hospice given the worsening PS. I think given his progressive deterioration, it seems reasonable to proceed home hospice.  Antonio Guerra

## 2022-06-29 NOTE — Telephone Encounter (Signed)
Spoke to [facility].

## 2022-07-04 ENCOUNTER — Other Ambulatory Visit (HOSPITAL_COMMUNITY): Payer: Self-pay

## 2022-07-10 ENCOUNTER — Emergency Department (HOSPITAL_COMMUNITY): Payer: Medicare (Managed Care)

## 2022-07-10 ENCOUNTER — Other Ambulatory Visit: Payer: Self-pay

## 2022-07-10 ENCOUNTER — Inpatient Hospital Stay (HOSPITAL_COMMUNITY)
Admission: EM | Admit: 2022-07-10 | Discharge: 2022-07-20 | DRG: 951 | Disposition: E | Payer: Medicare (Managed Care) | Attending: Internal Medicine | Admitting: Internal Medicine

## 2022-07-10 ENCOUNTER — Encounter (HOSPITAL_COMMUNITY): Payer: Self-pay | Admitting: Emergency Medicine

## 2022-07-10 DIAGNOSIS — Z833 Family history of diabetes mellitus: Secondary | ICD-10-CM

## 2022-07-10 DIAGNOSIS — Z955 Presence of coronary angioplasty implant and graft: Secondary | ICD-10-CM

## 2022-07-10 DIAGNOSIS — K209 Esophagitis, unspecified without bleeding: Secondary | ICD-10-CM | POA: Diagnosis present

## 2022-07-10 DIAGNOSIS — C7B1 Secondary Merkel cell carcinoma: Secondary | ICD-10-CM | POA: Diagnosis present

## 2022-07-10 DIAGNOSIS — E1122 Type 2 diabetes mellitus with diabetic chronic kidney disease: Secondary | ICD-10-CM | POA: Diagnosis present

## 2022-07-10 DIAGNOSIS — Z888 Allergy status to other drugs, medicaments and biological substances status: Secondary | ICD-10-CM

## 2022-07-10 DIAGNOSIS — I639 Cerebral infarction, unspecified: Secondary | ICD-10-CM

## 2022-07-10 DIAGNOSIS — I5022 Chronic systolic (congestive) heart failure: Secondary | ICD-10-CM | POA: Diagnosis present

## 2022-07-10 DIAGNOSIS — E785 Hyperlipidemia, unspecified: Secondary | ICD-10-CM | POA: Diagnosis present

## 2022-07-10 DIAGNOSIS — R400 Somnolence: Secondary | ICD-10-CM | POA: Diagnosis not present

## 2022-07-10 DIAGNOSIS — R140 Abdominal distension (gaseous): Secondary | ICD-10-CM | POA: Diagnosis present

## 2022-07-10 DIAGNOSIS — Z66 Do not resuscitate: Secondary | ICD-10-CM | POA: Diagnosis present

## 2022-07-10 DIAGNOSIS — D631 Anemia in chronic kidney disease: Secondary | ICD-10-CM | POA: Diagnosis present

## 2022-07-10 DIAGNOSIS — Z794 Long term (current) use of insulin: Secondary | ICD-10-CM | POA: Diagnosis not present

## 2022-07-10 DIAGNOSIS — Z515 Encounter for palliative care: Secondary | ICD-10-CM | POA: Diagnosis present

## 2022-07-10 DIAGNOSIS — J690 Pneumonitis due to inhalation of food and vomit: Secondary | ICD-10-CM

## 2022-07-10 DIAGNOSIS — I130 Hypertensive heart and chronic kidney disease with heart failure and stage 1 through stage 4 chronic kidney disease, or unspecified chronic kidney disease: Secondary | ICD-10-CM | POA: Diagnosis present

## 2022-07-10 DIAGNOSIS — Z87891 Personal history of nicotine dependence: Secondary | ICD-10-CM

## 2022-07-10 DIAGNOSIS — G8929 Other chronic pain: Secondary | ICD-10-CM | POA: Diagnosis present

## 2022-07-10 DIAGNOSIS — Z8673 Personal history of transient ischemic attack (TIA), and cerebral infarction without residual deficits: Secondary | ICD-10-CM

## 2022-07-10 DIAGNOSIS — Z7189 Other specified counseling: Secondary | ICD-10-CM | POA: Diagnosis not present

## 2022-07-10 DIAGNOSIS — M797 Fibromyalgia: Secondary | ICD-10-CM | POA: Diagnosis present

## 2022-07-10 DIAGNOSIS — Z923 Personal history of irradiation: Secondary | ICD-10-CM

## 2022-07-10 DIAGNOSIS — I255 Ischemic cardiomyopathy: Secondary | ICD-10-CM | POA: Diagnosis present

## 2022-07-10 DIAGNOSIS — K920 Hematemesis: Secondary | ICD-10-CM | POA: Diagnosis present

## 2022-07-10 DIAGNOSIS — M545 Low back pain, unspecified: Secondary | ICD-10-CM | POA: Diagnosis present

## 2022-07-10 DIAGNOSIS — K922 Gastrointestinal hemorrhage, unspecified: Secondary | ICD-10-CM

## 2022-07-10 DIAGNOSIS — K5981 Ogilvie syndrome: Secondary | ICD-10-CM | POA: Diagnosis present

## 2022-07-10 DIAGNOSIS — M1990 Unspecified osteoarthritis, unspecified site: Secondary | ICD-10-CM | POA: Diagnosis present

## 2022-07-10 DIAGNOSIS — R112 Nausea with vomiting, unspecified: Secondary | ICD-10-CM

## 2022-07-10 DIAGNOSIS — I2510 Atherosclerotic heart disease of native coronary artery without angina pectoris: Secondary | ICD-10-CM | POA: Diagnosis present

## 2022-07-10 DIAGNOSIS — Z91048 Other nonmedicinal substance allergy status: Secondary | ICD-10-CM

## 2022-07-10 DIAGNOSIS — C4A9 Merkel cell carcinoma, unspecified: Secondary | ICD-10-CM | POA: Diagnosis present

## 2022-07-10 DIAGNOSIS — N1831 Chronic kidney disease, stage 3a: Secondary | ICD-10-CM | POA: Diagnosis present

## 2022-07-10 DIAGNOSIS — R4781 Slurred speech: Secondary | ICD-10-CM | POA: Diagnosis present

## 2022-07-10 DIAGNOSIS — R000 Tachycardia, unspecified: Secondary | ICD-10-CM | POA: Diagnosis present

## 2022-07-10 DIAGNOSIS — Z79899 Other long term (current) drug therapy: Secondary | ICD-10-CM

## 2022-07-10 DIAGNOSIS — I252 Old myocardial infarction: Secondary | ICD-10-CM

## 2022-07-10 LAB — DIFFERENTIAL
Abs Immature Granulocytes: 0.06 10*3/uL (ref 0.00–0.07)
Basophils Absolute: 0 10*3/uL (ref 0.0–0.1)
Basophils Relative: 0 %
Eosinophils Absolute: 0 10*3/uL (ref 0.0–0.5)
Eosinophils Relative: 0 %
Immature Granulocytes: 1 %
Lymphocytes Relative: 15 %
Lymphs Abs: 1.4 10*3/uL (ref 0.7–4.0)
Monocytes Absolute: 0.9 10*3/uL (ref 0.1–1.0)
Monocytes Relative: 9 %
Neutro Abs: 6.8 10*3/uL (ref 1.7–7.7)
Neutrophils Relative %: 75 %

## 2022-07-10 LAB — I-STAT CHEM 8, ED
BUN: 65 mg/dL — ABNORMAL HIGH (ref 8–23)
Calcium, Ion: 1.22 mmol/L (ref 1.15–1.40)
Chloride: 108 mmol/L (ref 98–111)
Creatinine, Ser: 1.7 mg/dL — ABNORMAL HIGH (ref 0.61–1.24)
Glucose, Bld: 276 mg/dL — ABNORMAL HIGH (ref 70–99)
HCT: 28 % — ABNORMAL LOW (ref 39.0–52.0)
Hemoglobin: 9.5 g/dL — ABNORMAL LOW (ref 13.0–17.0)
Potassium: 5 mmol/L (ref 3.5–5.1)
Sodium: 142 mmol/L (ref 135–145)
TCO2: 24 mmol/L (ref 22–32)

## 2022-07-10 LAB — CBC
HCT: 30 % — ABNORMAL LOW (ref 39.0–52.0)
Hemoglobin: 9.4 g/dL — ABNORMAL LOW (ref 13.0–17.0)
MCH: 26.6 pg (ref 26.0–34.0)
MCHC: 31.3 g/dL (ref 30.0–36.0)
MCV: 84.7 fL (ref 80.0–100.0)
Platelets: 433 10*3/uL — ABNORMAL HIGH (ref 150–400)
RBC: 3.54 MIL/uL — ABNORMAL LOW (ref 4.22–5.81)
RDW: 13.9 % (ref 11.5–15.5)
WBC: 9.1 10*3/uL (ref 4.0–10.5)
nRBC: 0.2 % (ref 0.0–0.2)

## 2022-07-10 LAB — ABO/RH: ABO/RH(D): B NEG

## 2022-07-10 LAB — PROTIME-INR
INR: 1.2 (ref 0.8–1.2)
Prothrombin Time: 14.7 seconds (ref 11.4–15.2)

## 2022-07-10 LAB — COMPREHENSIVE METABOLIC PANEL
ALT: 17 U/L (ref 0–44)
AST: 38 U/L (ref 15–41)
Albumin: 2.7 g/dL — ABNORMAL LOW (ref 3.5–5.0)
Alkaline Phosphatase: 83 U/L (ref 38–126)
Anion gap: 12 (ref 5–15)
BUN: 64 mg/dL — ABNORMAL HIGH (ref 8–23)
CO2: 25 mmol/L (ref 22–32)
Calcium: 9.4 mg/dL (ref 8.9–10.3)
Chloride: 104 mmol/L (ref 98–111)
Creatinine, Ser: 2.04 mg/dL — ABNORMAL HIGH (ref 0.61–1.24)
GFR, Estimated: 34 mL/min — ABNORMAL LOW (ref >60)
Glucose, Bld: 271 mg/dL — ABNORMAL HIGH (ref 70–99)
Potassium: 4.6 mmol/L (ref 3.5–5.1)
Sodium: 141 mmol/L (ref 135–145)
Total Bilirubin: 0.4 mg/dL (ref 0.3–1.2)
Total Protein: 7.5 g/dL (ref 6.5–8.1)

## 2022-07-10 LAB — TYPE AND SCREEN
ABO/RH(D): B NEG
Antibody Screen: NEGATIVE

## 2022-07-10 LAB — ETHANOL: Alcohol, Ethyl (B): <10 mg/dL (ref <10)

## 2022-07-10 LAB — POC OCCULT BLOOD, ED: Fecal Occult Bld: POSITIVE — AB

## 2022-07-10 LAB — APTT: aPTT: 30 seconds (ref 24–36)

## 2022-07-10 LAB — CBG MONITORING, ED: Glucose-Capillary: 253 mg/dL — ABNORMAL HIGH (ref 70–99)

## 2022-07-10 MED ORDER — PROCHLORPERAZINE MALEATE 10 MG PO TABS: 10.0000 mg | ORAL_TABLET | Freq: Four times a day (QID) | ORAL | Status: DC | PRN

## 2022-07-10 MED ORDER — PROCHLORPERAZINE 25 MG RE SUPP: 25.0000 mg | Freq: Two times a day (BID) | RECTAL | Status: DC | PRN

## 2022-07-10 MED ORDER — SCOPOLAMINE 1 MG/3DAYS TD PT72
1.0000 | MEDICATED_PATCH | TRANSDERMAL | Status: DC
  Administered 2022-07-10: 1.5 mg via TRANSDERMAL
  Filled 2022-07-10: qty 1

## 2022-07-10 MED ORDER — ACETAMINOPHEN 325 MG PO TABS: 650.0000 mg | ORAL_TABLET | Freq: Four times a day (QID) | ORAL | Status: DC | PRN

## 2022-07-10 MED ORDER — LACTATED RINGERS IV BOLUS
1000.0000 mL | Freq: Once | INTRAVENOUS | Status: AC
  Administered 2022-07-10: 1000 mL via INTRAVENOUS

## 2022-07-10 MED ORDER — SODIUM CHLORIDE 0.9 % IV SOLN: 250.0000 mL | INTRAVENOUS | Status: DC | PRN

## 2022-07-10 MED ORDER — MORPHINE SULFATE (PF) 4 MG/ML IV SOLN
4.0000 mg | Freq: Once | INTRAVENOUS | Status: AC
  Administered 2022-07-10: 4 mg via INTRAVENOUS
  Filled 2022-07-10: qty 1

## 2022-07-10 MED ORDER — LORAZEPAM 1 MG PO TABS: 1.0000 mg | ORAL_TABLET | ORAL | Status: DC | PRN

## 2022-07-10 MED ORDER — ACETAMINOPHEN 650 MG RE SUPP: 650.0000 mg | Freq: Four times a day (QID) | RECTAL | Status: DC | PRN

## 2022-07-10 MED ORDER — PANTOPRAZOLE SODIUM 40 MG IV SOLR
40.0000 mg | Freq: Two times a day (BID) | INTRAVENOUS | Status: DC
  Administered 2022-07-10 – 2022-07-12 (×4): 40 mg via INTRAVENOUS
  Filled 2022-07-10 (×4): qty 10

## 2022-07-10 MED ORDER — SODIUM CHLORIDE 0.9 % IV SOLN: INTRAVENOUS | Status: DC

## 2022-07-10 MED ORDER — SODIUM CHLORIDE 0.9% FLUSH
3.0000 mL | Freq: Two times a day (BID) | INTRAVENOUS | Status: DC
  Administered 2022-07-11 (×2): 3 mL via INTRAVENOUS

## 2022-07-10 MED ORDER — LORAZEPAM 2 MG/ML IJ SOLN: 1.0000 mg | INTRAMUSCULAR | Status: DC | PRN

## 2022-07-10 MED ORDER — PROCHLORPERAZINE EDISYLATE 10 MG/2ML IJ SOLN: 10.0000 mg | Freq: Two times a day (BID) | INTRAMUSCULAR | Status: DC | PRN

## 2022-07-10 MED ORDER — LORAZEPAM 2 MG/ML PO CONC: 1.0000 mg | ORAL | Status: DC | PRN

## 2022-07-10 MED ORDER — HYDROMORPHONE HCL 1 MG/ML IJ SOLN
1.0000 mg | INTRAMUSCULAR | Status: DC | PRN
  Administered 2022-07-10 – 2022-07-11 (×2): 1 mg via INTRAVENOUS
  Filled 2022-07-10 (×2): qty 1

## 2022-07-10 MED ORDER — SODIUM CHLORIDE 0.9% FLUSH: 3.0000 mL | INTRAVENOUS | Status: DC | PRN

## 2022-07-10 MED ORDER — ONDANSETRON HCL 4 MG/2ML IJ SOLN
4.0000 mg | Freq: Once | INTRAMUSCULAR | Status: AC
  Administered 2022-07-10: 4 mg via INTRAVENOUS
  Filled 2022-07-10: qty 2

## 2022-07-10 NOTE — Assessment & Plan Note (Signed)
Oncology recommending home hospice care. Will consult palliative care for EOL care and transfer to inpatient hospice unit if possible during holiday weekend.

## 2022-07-10 NOTE — Consult Note (Signed)
Neurology Consultation Reason for Consult: Speech change Referring Physician: Consuello Masse  CC: Vomiting and aspiration  History is obtained from: Daughter, chart review  HPI: Antonio Guerra is a 71 y.o. male with a history of hypertension, hyperlipidemia, recent severe esophagitis, ileus who presents in distress with vomiting and aspiration and decreased verbal output.  He was last known well at 4:30 PM when a family member spoke with him.  At 7 PM a family member noted that they heard a gurgling and went into check on him and found him in distress, choking.  He was not speaking.  EMS was called and in transit he had coffee-ground emesis.  Due to the decreased speech, code stroke was activated.   LKW: 4:30 PM tpa given?: no, coffee-ground emesis Premorbid modified rankin scale: Five   Past Medical History:  Diagnosis Date   Altered mental status    Arthritis    "all over" (12/20/2015)   Chronic lower back pain    Chronic systolic congestive heart failure ([place])    a. 04/2014 Echo: EF 20-25%.   Coronary artery disease    a. 2011 MI x 2 with PCI: stent x 2 (LAD and RI) @ [facility] in [place], [place];  b. 04/2014 Cath/PCI: LM 10-20, LAD 40p, 55m 864mSR(3.5x38 Xience DES), 50apical, LCX 20 diffuse, RI 50p, 2012mR, RCA 40-41m59md.   Fibromyalgia    Hyperlipidemia    Hypertension    Hypertensive urgency 09/27/2015   [facility]   [facility]   "right hand weaker since" (12/20/2015)   Type II diabetes mellitus (HCC)[place]   Family History  Problem Relation Age of Onset   Diabetes Mother    CAD Neg Hx    Stomach cancer Neg Hx    Colon cancer Neg Hx    Esophageal cancer Neg Hx    Pancreatic cancer Neg Hx      Social History:  reports that he has quit smoking. He has never used smokeless tobacco. He reports that he does not currently use alcohol after a past usage of about 5.0 standard drinks of alcohol per week. He reports that he does not use  drugs.   Exam: Current vital signs: Wt 65 kg   BMI 18.91 kg/m  Vital signs in last 24 hours: Weight:  [65 kg] 65 kg (12/22 2000)   Physical Exam  Constitutional: Appears well-developed and well-nourished.  He is in active distress, repeatedly coughing   Neuro: Mental Status: Patient is awake, Cranial Nerves: II: Blinks to threat bilaterally. Pupils are equal, round, and reactive to light.   III,IV, VI: Eyes are midline, he actively resists doll's eye maneuver, does not look to either side. V: VII: Face is symmetric Motor: He moves all extremities spontaneously, no definite focal weakness, though given the degree of distress that he is then, does not cooperate with any type of testing including trying to get his arms to be held up.   Sensory: Responds to nox stim x 4 Cerebellar: Does not perform.    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  He does have a previous history of a left MCA infarct, so it is unclear  if he has had a recurrent infarct or his decreased responsiveness is simply a reaction to his acute distress.  If family chooses to pursue aggressive care, and he persists with deficits then could consider MRI of his brain to further delineate this.  Given his drop in hemoglobin with coffee-ground emesis in the setting of severe esophagitis, he is not a candidate for tenecteplase, and given his poor baseline and poor life expectancy, he is not a candidate for mechanical thrombectomy either.  Recommendations: 1) Please contact us if family decides to pursue aggressive care, neurology will continue to be available as needed but please call with further questions or  concerns.   Roland Rack, MD [facility] [phone]  If 7pm- 7am, please page neurology on call as listed in [place].

## 2022-07-10 NOTE — Assessment & Plan Note (Signed)
Neurology unsure of pt's altered mentation a results of new vs old stroke. Agree with neurology assessment that either would not make a difference in this patient's terminal condition. Given his UGI bleeding, pt is not a TNK candidate.

## 2022-07-10 NOTE — Subjective & Objective (Addendum)
CC: vomiting blood, stroke symptoms(slurred speech) HPI: 71 yo AAM with hx of aggressive merkel cell cancer, prior hx of CVA, type 2 DM, ischemic cardiomyopathy, chronic systolic CHF, ckd stage 3a, presents to ER via EMS due to slurred speech, vomiting blood. LKW was today at 16:30 pm according to triage notes.  EMS reported pt vomiting blood/coffee-ground emesis.  Pt had aspirated at home or during EMS transport.  Pt unable to give any ROS or history. Pt's dtr Antonio Guerra at bedside.  Patient diagnosed with Merkel cell cancer in July 2023 after noticing right inguinal adenopathy for several months.  Patient had a rough time with immunotherapy.  Oncology did not think that he would tolerate chemotherapy.  Patient underwent 1 round of radiation treatment but he did not tolerate it well.  Patient's had increasing complications from his comorbidities.  Oncology has recommended home hospice care.  On arrival, temp unrecorded.  Heart rate 146 blood pressure 115/66.  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  He also has bulky adenopathy in the right iliac and pelvic sidewall.  EDP and I both discussed the terminal nature of the patient's case with his daughter Antonio Guerra.  She wants to keep the patient comfortable.  She understands that the patient may die in the hospital.  Patient had been referred to home hospice by his oncologist last month.  [facility]  [facility] hospital[facility] contacted for admission.

## 2022-07-10 NOTE — Assessment & Plan Note (Signed)
Admit for terminal care. Unable to sign "admit to hospice" order. Start IV dilaudid prn. IV ativan prn. Discussed with pt's dtr michelle lee. Pt with multiple co-morbidities that make survival of this acute episode unlikely. Dtr agreeable to keeping pt comfortable. Explained to her that I could not predict exactly when pt would die. However pt is terminal. Palliative care consult for end-of-life care and possible transfer to inpatient hospice unit.

## 2022-07-10 NOTE — ED Provider Notes (Signed)
[facility] EMERGENCY DEPARTMENT Provider Note   CSN: [id number] Arrival date & time: 07/05/2022  2004     History {Add pertinent medical, surgical, social history, OB history to HPI:1} No chief complaint on file.   Antonio Guerra is a 71 y.o. male.  Has been having n/v       Home Medications Prior to Admission medications   Medication Sig Start Date End Date Taking? Authorizing Provider  acetaminophen (TYLENOL) 650 MG CR tablet Take 650 mg by mouth every 8 (eight) hours as needed for pain.    [provider]  alum & mag hydroxide-simeth (MAALOX/MYLANTA) 200-200-20 MG/5ML suspension Take 15 mLs by mouth every 4 (four) hours as needed for indigestion or heartburn.    [provider]  amoxicillin-clavulanate (AUGMENTIN) 875-125 MG tablet Take 1 tablet by mouth every 12 (twelve) hours. 06/24/22   Kommor, Madison, MD  Blood Glucose Monitoring Suppl (ONETOUCH VERIO) w/Device KIT 30 Units by Does not apply route daily. 11/02/17   Diallo, Earna Coder, MD  carvedilol (COREG) 12.5 MG tablet Take 12.5 mg by mouth 2 (two) times daily with a meal.    [provider]  clopidogrel (PLAVIX) 75 MG tablet TAKE 1 TABLET(75 MG) BY MOUTH DAILY Patient taking differently: Take 75 mg by mouth daily. 01/29/21   [name], MD  Continuous Blood Gluc Receiver (FREESTYLE LIBRE 14 DAY READER) DEVI 1 Device by Does not apply route every morning. 11/08/19   Bonnita Hollow, MD  cycloSPORINE (RESTASIS) 0.05 % ophthalmic emulsion Place 1 drop into both eyes 2 (two) times daily.    [provider]  Dulaglutide (TRULICITY) 0.01 VC/9.4WH SOPN Inject 0.75 mg into the skin once a week. Friday or saturday    [provider]  gabapentin (NEURONTIN) 400 MG capsule TAKE 1 CAPSULE BY MOUTH TWICE DAILY Patient taking differently: Take 400 mg by mouth 2 (two) times daily. 02/20/21   [name], MD  Incontinence Supply Disposable (DEPEND ADJUSTABLE UNDERWEAR LG)  MISC 1 application by Does not apply route as needed. 08/09/20   [name], MD  Insulin Pen Needle (BD PEN NEEDLE NANO U/F) 32G X 4 MM MISC AS DIRECTED TWICE DAILY 02/24/21   [name], MD  isosorbide mononitrate (ISMO) 10 MG tablet Take 10 mg by mouth daily.    [provider]  Lancets [facility] ULTRASOFT) lancets USE AS DIRECTED 08/09/20   [name], MD  lidocaine (SALONPAS PAIN RELIEVING) 4 % Place 1 patch onto the skin daily as needed (pain).    [provider]  lidocaine-prilocaine (EMLA) cream Apply to port site at least 1 hour prior to use 05/19/22   Iruku, Arletha Pili, MD  magnesium hydroxide (MILK OF MAGNESIA) 400 MG/5ML suspension Take 30 mLs by mouth daily. 06/05/22   Donne Hazel, MD  metoCLOPramide (REGLAN) 10 MG tablet Take 1 tablet (10 mg total) by mouth every 6 (six) hours as needed for up to 20 days for nausea. 05/04/22 05/24/22  Fransico Meadow, MD  nitroGLYCERIN (NITROSTAT) 0.4 MG SL tablet PLACE 1 TABLET UNDER THE TONGUE EVERY 5 MINS AS NEEDED FOR CHEST PAIN Patient taking differently: Place 0.4 mg under the tongue every 5 (five) minutes as needed for chest pain. 04/08/21   [name], MD  ondansetron (ZOFRAN) 8 MG tablet Take 1 tablet (8 mg total) by mouth every 8 (eight) hours as needed for nausea or vomiting. Start on third day after chemotherapy. 05/13/22   Benay Pike, MD  [facility] VERIO test  strip USE TO TEST BLOOD SUGAR 2 TO 3 TIMES A DAY 04/09/21   [name], MD  oxyCODONE (OXY IR/ROXICODONE) 5 MG immediate release tablet Take 1 tablet (5 mg total) by mouth every 6 (six) hours as needed for moderate pain. 06/04/22   Donne Hazel, MD  pantoprazole (PROTONIX) 40 MG tablet Take 1 tablet (40 mg total) by mouth 2 (two) times daily. 06/04/22 07/04/22  Donne Hazel, MD  prochlorperazine (COMPAZINE) 10 MG tablet Take 1 tablet (10 mg total) by mouth every 6 (six) hours as needed for nausea or vomiting (Nausea or vomiting). 05/13/22   Benay Pike, MD  sucralfate (CARAFATE) 1 g tablet Take 1 tablet (1 g total) by mouth 4 (four) times daily -  with meals and at bedtime for 7 days. May use longer if needed. 05/15/22 05/22/22  Eugenie Filler, MD      Allergies    Lisinopril and Tape    Review of Systems   Review of Systems  Physical Exam Updated Vital Signs Wt 65 kg   BMI 18.91 kg/m  Physical Exam  ED Results / Procedures / Treatments   Labs (all labs ordered are listed, but only abnormal results are displayed) Labs Reviewed  I-STAT CHEM 8, ED - Abnormal; Notable for the following components:      Result Value   BUN 65 (*)    Creatinine, Ser 1.70 (*)    Glucose, Bld 276 (*)    Hemoglobin 9.5 (*)    HCT 28.0 (*)    All other components within normal limits  CBG MONITORING, ED - Abnormal; Notable for the following components:   Glucose-Capillary 253 (*)    All other components within normal limits  ETHANOL  PROTIME-INR  APTT  CBC  DIFFERENTIAL  COMPREHENSIVE METABOLIC PANEL  RAPID URINE DRUG SCREEN, HOSP PERFORMED  URINALYSIS, ROUTINE W REFLEX MICROSCOPIC  POC OCCULT BLOOD, ED  TYPE AND SCREEN    EKG None  Radiology CT HEAD CODE STROKE WO CONTRAST  Result Date: 06/21/2022 CLINICAL DATA:  Code stroke. EXAM: CT HEAD WITHOUT CONTRAST TECHNIQUE: Contiguous axial images were obtained from the base of the skull through the vertex without intravenous contrast. RADIATION DOSE REDUCTION: This exam was performed according to the departmental dose-optimization program which includes automated exposure control, adjustment of the mA and/or kV according to patient size and/or use of iterative reconstruction technique. COMPARISON:  Prior CT from 06/24/2022. FINDINGS: Brain: Atrophy with chronic small vessel ischemic disease. Remote left MCA territory infarct involving the posterior left frontal lobe. Additional chronic infarcts involving the ventral right thalamus and superior left cerebellum. Small remote lacunar  infarct at the right frontal corona radiata. No acute intracranial hemorrhage. No acute large vessel territory infarct. No mass lesion or midline shift. No hydrocephalus or extra-axial fluid collection. Vascular: No abnormal hyperdense vessel. Calcified atherosclerosis present at the skull base. Skull: Scalp soft tissues and calvarium within normal limits. Sinuses/Orbits: Globes orbital soft tissues demonstrate no acute finding. Scattered mucosal thickening noted about the ethmoidal air cells. Paranasal sinuses are otherwise clear. No mastoid effusion. Other: None. ASPECTS [facility] Stroke Program Early CT Score) - Ganglionic level infarction (caudate, lentiform nuclei, internal capsule, insula, M1-M3 cortex): 7 - Supraganglionic infarction (M4-M6 cortex): 3 Total score (0-10 with 10 being normal): 10 IMPRESSION: 1. No acute intracranial abnormality. 2. ASPECTS is 10. 3. Atrophy with chronic small vessel ischemic disease with multiple chronic ischemic infarcts as above, stable. These results were communicated to Dr. Leonel Ramsay at  8:25 pm on 07/09/2022 by text page via the [facility] messaging system. Electronically Signed   By: Jeannine Boga M.D.   On: 07/07/2022 20:27    Procedures Procedures  {Document cardiac monitor, telemetry assessment procedure when appropriate:1}  Medications Ordered in ED Medications - No data to display  ED Course/ Medical Decision Making/ A&P Clinical Course as of 07/06/2022 2051  Fri Jul 10, 2022  2042 Antonio Guerra is being cared for by [facility], please call [phone] for additional information on this patient's care.   [RP]  2049 Discussed CODE STATUS with the patient's daughter.  She would like for him to stay DNR/DNI at this time.  Also discussed NG tube placement with the daughter and feels that he would not like this at this time as it would be uncomfortable.  She understands that he could die from this illness and we will provide measures such as IV  fluids and medications to keep him comfortable. [RP]    Clinical Course User Index [RP] Fransico Meadow, MD                           Medical Decision Making Amount and/or Complexity of Data Reviewed Radiology: ordered.  Risk Prescription drug management.   ***  {Document critical care time when appropriate:1} {Document review of labs and clinical decision tools ie heart score, Chads2Vasc2 etc:1}  {Document your independent review of radiology images, and any outside records:1} {Document your discussion with family members, caretakers, and with consultants:1} {Document social determinants of health affecting pt's care:1} {Document your decision making why or why not admission, treatments were needed:1} Final Clinical Impression(s) / ED Diagnoses Final diagnoses:  None    Rx / DC Orders ED Discharge Orders     None

## 2022-07-10 NOTE — ED Notes (Addendum)
Per MD Philip Aspen, remove fentanyl patch on left upper arm.  Patch removed and wasted into appropriate receptacle with Arnett RN witnessing.

## 2022-07-10 NOTE — Assessment & Plan Note (Signed)
Pt had coffee ground emesis. Hx of esophagitis.

## 2022-07-10 NOTE — Assessment & Plan Note (Signed)
Dtr states that pt would not keep NG tube in place and does not want NG tube placed.

## 2022-07-10 NOTE — ED Notes (Signed)
MD Sharlett Iles at bedside for [place] IV.

## 2022-07-10 NOTE — ED Triage Notes (Signed)
Patient BIB [facility] from home c/o stroke symptoms.  Family reports patient went to bed at 1630 and then when he woke up at 1900, they noticed slurred speech and patient not talking per normal.  EMS reports coffee ground emesis enroute.

## 2022-07-10 NOTE — ED Notes (Signed)
Respiratory at bedside.

## 2022-07-10 NOTE — ED Notes (Signed)
Per DO Bridgett Larsson, patient is comfort care/palliative care consult at this time.

## 2022-07-10 NOTE — H&P (Signed)
History and Physical    Antonio Guerra XTG:[id number] DOB: May 25, 1951 DOA: 07/11/2022  DOS: the patient was seen and examined on 07/04/2022  PCP: [name], MD   Patient coming from: Home  I have personally briefly reviewed patient's old medical records in [place]  CC: vomiting blood, stroke symptoms(slurred speech) HPI: 71 yo AAM with hx of aggressive merkel cell cancer, prior hx of CVA, type 2 DM, ischemic cardiomyopathy, chronic systolic CHF, ckd stage 3a, presents to ER via EMS due to slurred speech, vomiting blood. LKW was today at 16:30 pm according to triage notes.  EMS reported pt vomiting blood/coffee-ground emesis.  Pt had aspirated at home or during EMS transport.  Pt unable to give any ROS or history. Pt's dtr [name] at bedside.  Patient diagnosed with Merkel cell cancer in July 2023 after noticing right inguinal adenopathy for several months.  Patient had a rough time with immunotherapy.  Oncology did not think that he would tolerate chemotherapy.  Patient underwent 1 round of radiation treatment but he did not tolerate it well.  Patient's had increasing complications from his comorbidities.  Oncology has recommended home hospice care.  On arrival, temp unrecorded.  Heart rate 146 blood pressure 115/66.  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  He also has bulky adenopathy in the right iliac and pelvic sidewall.  EDP and I both discussed the terminal nature of the patient's case with his daughter [name].  She wants to keep the patient comfortable.  She understands that the patient may die in the hospital.  Patient had been referred to home hospice by his oncologist last month.  [facility]  [facility] contacted for  admission.    ED Course: CT head showed old multiple CVA. CT abd showed pseudo colonic obstruction  Review of Systems:  Review of Systems  Unable to perform ROS: Patient unresponsive    Past Medical History:  Diagnosis Date   Altered mental status    Arthritis    "all over" (12/20/2015)   Chronic lower back pain    Chronic systolic congestive heart failure ([place])    a. 04/2014 Echo: EF 20-25%.   Coronary artery disease    a. 2011 MI x 2 with PCI: stent x 2 (LAD and RI) @ [facility] in [place], [place];  b. 04/2014 Cath/PCI: LM 10-20, LAD 40p, 74m 888mSR(3.5x38 Xience DES), 50apical, LCX 20 diffuse, RI 50p, 2064mR, RCA 40-74m37md.   Fibromyalgia    Hyperlipidemia    Hypertension    Hypertensive urgency 09/27/2015   [facility]   [facility]   "right hand weaker since" (12/20/2015)   [facility]  Past Surgical History:  Procedure Laterality Date   ANKLE FRACTURE SURGERY Right    BIOPSY  05/13/2022   Procedure: BIOPSY;  Surgeon: PyrtJerene Bears;  Location: [facility];  Service: Gastroenterology;;  egd with biopsies   CARDIAC CATHETERIZATION N/A 09/30/2015   Procedure: Left Heart Cath and Coronary Angiography;  Surgeon: ChriBurnell Blanks;  [facility];  Service: Cardiovascular;  Laterality: N/A;   CORONARY ANGIOPLASTY WITH STENT PLACEMENT     "total of 3 stents" (12/20/2015)   ESOPHAGOGASTRODUODENOSCOPY (EGD) WITH PROPOFOL N/A 05/13/2022  Procedure: ESOPHAGOGASTRODUODENOSCOPY (EGD) WITH PROPOFOL;  Surgeon: Jerene Bears, MD;  Location: [facility];  Service: Gastroenterology;  Laterality: N/A;   FLEXIBLE SIGMOIDOSCOPY N/A 05/13/2022   Procedure: FLEXIBLE SIGMOIDOSCOPY;  Surgeon: Jerene Bears, MD;  Location: [facility];  Service: Gastroenterology;  Laterality: N/A;   FRACTIONAL FLOW RESERVE WIRE  05/11/2014   Procedure: FRACTIONAL FLOW RESERVE WIRE;  Surgeon: Burnell Blanks, MD;  Location: [facility]  CATH LAB;  Service: Cardiovascular;;   FRACTURE SURGERY     LEFT HEART CATHETERIZATION WITH CORONARY/GRAFT ANGIOGRAM N/A 05/11/2014   Procedure: LEFT HEART CATHETERIZATION WITH Beatrix Fetters;  Surgeon: Burnell Blanks, MD;  Location: [facility] CATH LAB;  Service: Cardiovascular;  Laterality: N/A;   PERCUTANEOUS CORONARY STENT INTERVENTION (PCI-S)  05/11/2014   Procedure: PERCUTANEOUS CORONARY STENT INTERVENTION (PCI-S);  Surgeon: Burnell Blanks, MD;  Location: [facility] CATH LAB;  Service: Cardiovascular;;     reports that he has quit smoking. He has never used smokeless tobacco. He reports that he does not currently use alcohol after a past usage of about 5.0 standard drinks of alcohol per week. He reports that he does not use drugs.  Allergies  Allergen Reactions   Lisinopril Other (See Comments)    Caused pt to have chest pains.    Tape Itching and Rash    Family History  Problem Relation Age of Onset   Diabetes Mother    CAD Neg Hx    Stomach cancer Neg Hx    Colon cancer Neg Hx    Esophageal cancer Neg Hx    Pancreatic cancer Neg Hx     Prior to Admission medications   Medication Sig Start Date End Date Taking? Authorizing Provider  guaifenesin (ROBITUSSIN) 100 MG/5ML syrup Take 200 mg by mouth in the morning and at bedtime.   Yes [provider]  HYDROmorphone HCl (DILAUDID) 1 MG/ML LIQD Take 10 mg by mouth every 4 (four) hours as needed for severe pain.   Yes [provider]  hydrOXYzine (ATARAX) 10 MG/5ML syrup Take by mouth every 6 (six) hours as needed for anxiety.   Yes [provider]  PRESCRIPTION MEDICATION Place 1 patch onto the skin every 3 (three) days. Fentanyl Patch   Yes [provider]  senna-docusate (SENOKOT-S) 8.6-50 MG tablet Take 2 tablets by mouth 2 (two) times daily.   Yes [provider]  amoxicillin-clavulanate (AUGMENTIN) 875-125 MG tablet Take 1 tablet by mouth every 12 (twelve) hours. Patient  not taking: Reported on 07/07/2022 06/24/22   Kommor, Debe Coder, MD  Blood Glucose Monitoring Suppl (ONETOUCH VERIO) w/Device KIT 30 Units by Does not apply route daily. 11/02/17   Diallo, Earna Coder, MD  Continuous Blood Gluc Receiver (FREESTYLE LIBRE 14 DAY READER) DEVI 1 Device by Does not apply route every morning. 11/08/19   Bonnita Hollow, MD  Incontinence Supply Disposable (DEPEND ADJUSTABLE UNDERWEAR LG) MISC 1 application by Does not apply route as needed. 08/09/20   [name], MD  Insulin Pen Needle (BD PEN NEEDLE NANO U/F) 32G X 4 MM MISC AS DIRECTED TWICE DAILY 02/24/21   [name], MD  Lancets [facility] ULTRASOFT) lancets USE AS DIRECTED 08/09/20   [name], MD  lidocaine-prilocaine (EMLA) cream Apply to port site at least 1 hour prior to use 05/19/22   Iruku, Arletha Pili, MD  nitroGLYCERIN (NITROSTAT) 0.4 MG SL tablet PLACE 1 TABLET UNDER THE TONGUE EVERY 5 MINS AS NEEDED FOR CHEST PAIN Patient taking differently: Place 0.4 mg under the tongue every 5 (  five) minutes as needed for chest pain. 04/08/21   [name], MD  [facility] VERIO test strip USE TO TEST BLOOD SUGAR 2 TO 3 TIMES A DAY 04/09/21   [name], MD    Physical Exam: Vitals:   06/23/2022 2200 07/11/2022 2215 07/14/2022 2230 07/07/2022 2245  BP: 124/81 101/65 126/82 121/76  Pulse: (!) 141 (!) 136 (!) 135 (!) 130  Resp: (!) _0 (!) 23  SpO2: 100% 100% 100% 100%  Weight:      Height:        Physical Exam Vitals and nursing note reviewed.  Constitutional:      Comments: Ill appearing AAM  HENT:     Head: Normocephalic.  Cardiovascular:     Rate and Rhythm: Regular rhythm. Tachycardia present.  Pulmonary:     Breath sounds: Rhonchi present.  Abdominal:     General: There is distension.  Skin:    General: Skin is warm.  Neurological:     Comments: unresponsive      Labs on Admission: I have personally reviewed following labs and imaging studies  CBC: Recent Labs  Lab 06/22/2022 2011 06/25/2022 2045  WBC   --  9.1  NEUTROABS  --  6.8  HGB 9.5* 9.4*  HCT 28.0* 30.0*  MCV  --  84.7  PLT  --  510*   Basic Metabolic Panel: Recent Labs  Lab 07/11/2022 2011 07/04/2022 2045  NA 142 141  K 5.0 4.6  CL 108 104  CO2  --  25  GLUCOSE 276* 271*  BUN 65* 64*  CREATININE 1.70* 2.04*  CALCIUM  --  9.4   GFR: Estimated Creatinine Clearance: 31 mL/min (A) (by C-G formula based on SCr of 2.04 mg/dL (H)). Liver Function Tests: Recent Labs  Lab 07/08/2022 2045  AST 38  ALT 17  ALKPHOS 83  BILITOT 0.4  PROT 7.5  ALBUMIN 2.7*   No results for input(s): "LIPASE", "AMYLASE" in the last 168 hours. No results for input(s): "AMMONIA" in the last 168 hours. Coagulation Profile: Recent Labs  Lab 07/07/2022 2045  INR 1.2   Cardiac Enzymes: No results for input(s): "CKTOTAL", "CKMB", "CKMBINDEX", "TROPONINI", "TROPONINIHS" in the last 168 hours. BNP (last 3 results) No results for input(s): "PROBNP" in the last 8760 hours. HbA1C: No results for input(s): "HGBA1C" in the last 72 hours. CBG: Recent Labs  Lab 07/19/2022 2010  GLUCAP 253*   Lipid Profile: No results for input(s): "CHOL", "HDL", "LDLCALC", "TRIG", "CHOLHDL", "LDLDIRECT" in the last 72 hours. Thyroid Function Tests: No results for input(s): "TSH", "T4TOTAL", "FREET4", "T3FREE", "THYROIDAB" in the last 72 hours. Anemia Panel: No results for input(s): "VITAMINB12", "FOLATE", "FERRITIN", "TIBC", "IRON", "RETICCTPCT" in the last 72 hours. Urine analysis:    Component Value Date/Time   COLORURINE YELLOW 05/20/2022 0252   APPEARANCEUR HAZY (A) 05/20/2022 0252   LABSPEC 1.023 05/20/2022 0252   PHURINE 5.0 05/20/2022 0252   GLUCOSEU NEGATIVE 05/20/2022 0252   HGBUR NEGATIVE 05/20/2022 0252   BILIRUBINUR NEGATIVE 05/20/2022 0252   KETONESUR NEGATIVE 05/20/2022 0252   PROTEINUR 30 (A) 05/20/2022 0252   NITRITE NEGATIVE 05/20/2022 0252   LEUKOCYTESUR NEGATIVE 05/20/2022 0252    Radiological Exams on Admission: I have personally  reviewed images CT ABDOMEN PELVIS WO CONTRAST  Result Date: 07/15/2022 CLINICAL DATA:  Nausea, vomiting, ileus, abdominal pain * Tracking Code: BO * EXAM: CT ABDOMEN AND PELVIS WITHOUT CONTRAST TECHNIQUE: Multidetector CT imaging of the abdomen and pelvis was performed following the standard protocol  without IV contrast. RADIATION DOSE REDUCTION: This exam was performed according to the departmental dose-optimization program which includes automated exposure control, adjustment of the mA and/or kV according to patient size and/or use of iterative reconstruction technique. COMPARISON:  06/24/2022 FINDINGS: Lower chest: Coronary artery calcifications and stents. Heterogeneous and ground-glass airspace opacity of the dependent right lung base, similar to prior examination. Patulous, fluid-filled lower esophagus. Hepatobiliary: No solid liver abnormality is seen. No gallstones, gallbladder wall thickening, or biliary dilatation. Pancreas: Unremarkable. No pancreatic ductal dilatation or surrounding inflammatory changes. Spleen: Normal in size without significant abnormality. Adrenals/Urinary Tract: Adrenal glands are unremarkable. Multiple punctuate bilateral nonobstructive renal calculi no ureteral calculi or hydronephrosis. Bladder is unremarkable. Stomach/Bowel: Stomach is within normal limits. Normal appendix. Diffusely distended, stool-filled colon, with a large burden of stool present to the rectum. The transverse colon measures up to 7.6 cm. Vascular/Lymphatic: Aortic atherosclerosis. Unchanged right inguinal bulky mass or lymph node conglomerate measuring 10.3 x 6.2 cm (series 3, image 91). Unchanged right iliac and pelvic sidewall lymphadenopathy right external iliac nodes measuring up to 4.3 x 2.9 cm (series 3, image 79). Reproductive: No mass or other significant abnormality. Other: No abdominal wall hernia or abnormality. No ascites. Musculoskeletal: No acute or significant osseous findings. Soft tissue  edema of the included right lower extremity. IMPRESSION: 1. Diffusely distended, stool-filled colon, with a large burden of stool present to the rectum. The transverse colon measures up to 7.6 cm. 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 2. Unchanged right inguinal bulky mass or lymph node conglomerate and associated right iliac and pelvic sidewall lymphadenopathy. Findings are consistent with lymphoma or metastatic disease. 3. Soft tissue edema of the included right lower extremity, likely related to vascular or lymphatic outflow obstruction secondary to mass. 4. Heterogeneous and ground-glass airspace opacity of the dependent right lung base, similar to prior examination and consistent with infection or aspiration. 5. Patulous, fluid-filled lower esophagus. Aortic Atherosclerosis (ICD10-I70.0). Electronically Signed   By: Delanna Ahmadi M.D.   On: 06/19/2022 20:48   CT HEAD CODE STROKE WO CONTRAST  Result Date: 06/23/2022 CLINICAL DATA:  Code stroke. EXAM: CT HEAD WITHOUT CONTRAST TECHNIQUE: Contiguous axial images were obtained from the base of the skull through the vertex without intravenous contrast. RADIATION DOSE REDUCTION: This exam was performed according to the departmental dose-optimization program which includes automated exposure control, adjustment of the mA and/or kV according to patient size and/or use of iterative reconstruction technique. COMPARISON:  Prior CT from 06/24/2022. FINDINGS: Brain: Atrophy with chronic small vessel ischemic disease. Remote left MCA territory infarct involving the posterior left frontal lobe. Additional chronic infarcts involving the ventral right thalamus and superior left cerebellum. Small remote lacunar infarct at the right frontal corona radiata. No acute intracranial hemorrhage. No acute large vessel territory infarct. No mass lesion or midline shift. No hydrocephalus or extra-axial fluid collection. Vascular: No abnormal hyperdense  vessel. Calcified atherosclerosis present at the skull base. Skull: Scalp soft tissues and calvarium within normal limits. Sinuses/Orbits: Globes orbital soft tissues demonstrate no acute finding. Scattered mucosal thickening noted about the ethmoidal air cells. Paranasal sinuses are otherwise clear. No mastoid effusion. Other: None. ASPECTS [facility] Stroke Program Early CT Score) - Ganglionic level infarction (caudate, lentiform nuclei, internal capsule, insula, M1-M3 cortex): 7 - Supraganglionic infarction (M4-M6 cortex): 3 Total score (0-10 with 10 being normal): 10 IMPRESSION: 1. No acute intracranial abnormality. 2. ASPECTS is 10. 3. Atrophy with chronic small vessel ischemic disease with multiple chronic ischemic infarcts as above, stable.  These results were communicated to Dr. Leonel Ramsay at 8:25 pm on 07/09/2022 by text page via the [facility] messaging system. Electronically Signed   By: Jeannine Boga M.D.   On: 06/20/2022 20:27    EKG: My personal interpretation of EKG shows: sinus tachycardia    Assessment/Plan Principal Problem:   Admission for terminal care Active Problems:   CVA (cerebral vascular accident) ([place])   Aspiration pneumonia ([place])   UGI bleed   Ogilvie syndrome   DNR (do not resuscitate)/DNI(Do Not Intubate)   Merkel cell carcinoma nodal presentation ([place])    Assessment and Plan: * Admission for terminal care Admit for terminal care. Unable to sign "admit to hospice" order. Start IV dilaudid prn. IV ativan prn. Discussed with pt's dtr michelle lee. Pt with multiple co-morbidities that make survival of this acute episode unlikely. Dtr agreeable to keeping pt comfortable. Explained to her that I could not predict exactly when pt would die. However pt is terminal. Palliative care consult for end-of-life care and possible transfer to inpatient hospice unit.  UGI bleed Pt had coffee ground emesis. Hx of esophagitis.  Aspiration pneumonia ([place]) Pt clearly aspirated  today. Still having gurgling noise with respirations. Continue with prn suctioning.  CVA (cerebral vascular accident) [facility]) Neurology unsure of pt's altered mentation a results of new vs old stroke. Agree with neurology assessment that either would not make a difference in this patient's terminal condition. Given his UGI bleeding, pt is not a TNK candidate.  DNR (do not resuscitate)/DNI(Do Not Intubate)   Ogilvie syndrome Dtr states that pt would not keep NG tube in place and does not want NG tube placed.  Merkel cell carcinoma nodal presentation [facility]) Oncology recommending home hospice care. Will consult palliative care for EOL care and transfer to inpatient hospice unit if possible during holiday weekend.   DVT prophylaxis:  none due to comfort care Code Status: Comfort Care Family Communication: discussed with pt's dtr [name]  Disposition Plan: discharge with home hospice or inpatient hospice  Consults called: orders placed for palliative care Admission status: Inpatient, Med-Surg   Kristopher Oppenheim, DO [facility] 06/25/2022, 11:01 PM

## 2022-07-10 NOTE — Assessment & Plan Note (Signed)
Pt clearly aspirated today. Still having gurgling noise with respirations. Continue with prn suctioning.

## 2022-07-11 ENCOUNTER — Inpatient Hospital Stay (HOSPITAL_COMMUNITY): Payer: Medicare (Managed Care)

## 2022-07-11 DIAGNOSIS — Z515 Encounter for palliative care: Secondary | ICD-10-CM | POA: Diagnosis not present

## 2022-07-11 DIAGNOSIS — Z7189 Other specified counseling: Secondary | ICD-10-CM

## 2022-07-11 MED ORDER — LORAZEPAM 2 MG/ML IJ SOLN
0.5000 mg | INTRAMUSCULAR | Status: DC | PRN
  Administered 2022-07-12 (×2): 1 mg via INTRAVENOUS
  Filled 2022-07-11 (×2): qty 1

## 2022-07-11 MED ORDER — LORAZEPAM 2 MG/ML IJ SOLN
1.0000 mg | Freq: Once | INTRAMUSCULAR | Status: AC
  Administered 2022-07-11: 1 mg via INTRAVENOUS
  Filled 2022-07-11: qty 1

## 2022-07-11 MED ORDER — FENTANYL CITRATE PF 50 MCG/ML IJ SOSY
100.0000 ug | PREFILLED_SYRINGE | Freq: Once | INTRAMUSCULAR | Status: AC
  Administered 2022-07-11: 100 ug via INTRAVENOUS
  Filled 2022-07-11: qty 2

## 2022-07-11 MED ORDER — FENTANYL BOLUS VIA INFUSION
100.0000 ug | INTRAVENOUS | Status: DC | PRN
  Administered 2022-07-12 (×3): 100 ug via INTRAVENOUS

## 2022-07-11 MED ORDER — FENTANYL 2500MCG IN NS 250ML (10MCG/ML) PREMIX INFUSION
100.0000 ug/h | INTRAVENOUS | Status: DC
  Administered 2022-07-11 – 2022-07-12 (×2): 100 ug/h via INTRAVENOUS
  Filled 2022-07-11 (×2): qty 250

## 2022-07-11 NOTE — Progress Notes (Signed)
   Palliative Medicine Inpatient Follow Up Note   I spoke to nursing staff as unable to get NGT in place.  I met with patients daughter, Antonio Guerra at bedside. Plan to stop attempts to place NGT and manage symptoms with fentanyl gtt  and PRN ativan at this time.  On assessment Antonio Guerra appears comfortable and non-distressed.   Additional Time: 22 ______________________________________________________________________________________ [place] Team Team Cell Phone: [phone] Please utilize secure chat with additional questions, if there is no response within 30 minutes please call the above phone number  Palliative Medicine Team providers are available by phone from 7am to 7pm daily and can be reached through the team cell phone.  Should this patient require assistance outside of these hours, please call the patient's attending physician.

## 2022-07-11 NOTE — Progress Notes (Signed)
Patient ID: Antonio Guerra, male   DOB: 1950/12/22, 71 y.o.   MRN: [id number] Attempted to insert NG tube x 2. Xrays confirmed tube was coiled in the back of patient's throat because of his inability to swallow. NP notified, orders discontinued. Talked with daughter about goals of comfort care. NP at bedside.  Haydee Salter, RN

## 2022-07-11 NOTE — Consult Note (Signed)
Palliative Medicine Inpatient Consult Note  Consulting Provider: Dr. Bridgett Larsson  Reason for consult:   [facility]iative Medicine Consult  Reason for Consult? end of life care   07/11/2022  HPI:  Per intake H&P --> 71 yo AAM with hx of aggressive merkel cell cancer, prior hx of CVA, type 2 DM, ischemic cardiomyopathy, chronic systolic CHF, ckd stage 3a, presents to ER via EMS due to slurred speech, vomiting blood. Is on end of life care at home through [facility].  Palliative care involved to further help discuss symptom management related to end of life care.   Clinical Assessment/Goals of Care:  *Please note that this is a verbal dictation therefore any spelling or grammatical errors are due to the "[facility]" system interpretation.  I have reviewed medical records including EPIC notes, labs and imaging, received report from bedside RN, assessed the patient.    I met with Antonio Guerra daughter, Antonio Guerra to further discuss diagnosis prognosis, GOC, EOL wishes, disposition and options.   I introduced Palliative Medicine as specialized medical care for people living with serious illness. It focuses on providing relief from the symptoms and stress of a serious illness. The goal is to improve quality of life for both the patient and the family.  Medical History Review and Understanding:  Patients daughter understands Antonio Guerra's current aggressive merkel cell cancer, prior hx of CVA, type 2 DM, chronic systolic CHF, and CKD III.  Social History:  Antonio Guerra lives in [place], [place]. He lives in the home with his daughter, Antonio Guerra and his grandchildren.  His wife is deceased and was under the care of hospice at the time of her death.  He had 3 children, unfortunately only one is living.  8 grandchildren.  He is originally from [place].  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 He is a man of faith and practices within Christianity.  Functional  and Nutritional State:  Patient is cared for by his daughter, Antonio Guerra in the home. He has been getting weaker and have more difficulty with tolerance of nutrition in the home.   Advance Directives:  A detailed discussion was had today regarding advanced directives.  Patients daughter, Antonio Guerra is his Air traffic controller.   Code Status:  Concepts specific to code status, artifical feeding and hydration, continued IV antibiotics and rehospitalization was had.  The difference between a aggressive medical intervention path and a palliative comfort care path for this patient at this time was had.   DNAR/DNI.  Discussion:  I met with Antonio Guerra and her good friend at bedside.  Antonio Guerra is concerned that Antonio Guerra is extremely uncomfortable and asked why we have not started a drip medication.  I was able to assess him and I did agree that his belly was notably distended and he was vocally moaning as well as grimacing significantly with any type of physical touch.  We discussed starting a fentanyl drip and placing a nasogastric tube to help with decompression in the setting of a pseudoobstruction.  We further discussed providing an enema to the patient to help relieve some of his obstruction.  I was able to speak with [facility] who did confirm he is on end-of-life/comfort measures at home.  I updated her on his symptom burden and how he will be staying in the hospital in the setting of these acute symptoms for better management.  I educated patient's family on the reasons why when the patient  starts actively dying we do not initiate maintenance IV fluids.  Educated on why in patients who are not during an obstructive process we typically do not offer food.  The goals of patient's daughter are to keep the patient as comfortable as possible.  She does realize that he may pass away in the hospital and is at peace with this possible reality.  We discussed the plan for ongoing support  related to patient's symptoms.  Provided  "Gone From My Site" booklet.  Discussed the importance of continued conversation with family and their  medical providers regarding overall plan of care and treatment options, ensuring decisions are within the context of the patients values and GOCs.  Decision Maker: Antonio Guerra (Daughter):[phone] (Mobile)   SUMMARY OF RECOMMENDATIONS   DNAR/DNI  Comfort care  Initiate low-dose fentanyl drip with boluses and titration  Additional comfort medications per MAR  Nasogastric tube insertion for decompression  Tapwater enema in an effort to get bowels moving  Unrestricted visitation  Ongoing palliative support  Code Status/Advance Care Planning: DNAR/DNI   Symptom Management:   Palliative Prophylaxis:  Aspiration, Bowel Regimen, Delirium Protocol, Frequent Pain Assessment, Oral Care, Palliative Wound Care, and Turn Reposition  Additional Recommendations (Limitations, Scope, Preferences): Comfort care  Psycho-social/Spiritual:  Desire for further Chaplaincy support: Not presently Additional Recommendations: Education on end-of-life   Prognosis: Likely limited to days  Discharge Planning: Discharge discharge plan uncertain at this time quite possibly will be [facility].  Vitals:   07/01/2022 2245 06/24/2022 2342  BP: 121/76 117/69  Pulse: (!) 130 (!) 129  Resp: (!) 23 16  Temp:  98.6 F (37 C)  SpO2: 100% 99%    Intake/Output Summary (Last 24 hours) at 07/11/2022 1309 Last data filed at 07/11/2022 1031 Gross per 24 hour  Intake 3 ml  Output --  Net 3 ml   Last Weight  Most recent update: 07/01/2022  8:11 PM    Weight  65 kg (143 lb 4.8 oz)            Gen: Elderly African-American male in severe distress HEENT: Dry mucous membranes CV: Irregular rate and rhythm PULM: On room air breathing is even and nonlabored ABD: Distended, taut, exquisitely tender EXT: No edema Neuro: Opens eyes but is extremely  somnolent  PPS: 10%   This conversation/these recommendations were discussed with patient primary care team, Dr. Doristine Bosworth  Billing based on MDM: High  Problems Addressed: One acute or chronic illness or injury that poses a threat to life or bodily function  Amount and/or Complexity of Data: Category 3:Discussion of management or test interpretation with external physician/other qualified health care professional/appropriate source (not separately reported)  Risks: Decision regarding hospitalization or escalation of hospital care and Decision not to resuscitate or to de-escalate care because of poor prognosis ______________________________________________________ [facility] Team Team Cell Phone: [phone] Please utilize secure chat with additional questions, if there is no response within 30 minutes please call the above phone number  Palliative Medicine Team providers are available by phone from 7am to 7pm daily and can be reached through the team cell phone.  Should this patient require assistance outside of these hours, please call the patient's attending physician.

## 2022-07-11 NOTE — Progress Notes (Signed)
PROGRESS NOTE    Antonio Guerra  FSE:[id number] DOB: 01/19/1951 DOA: 07/06/2022 PCP: [name], MD   Brief Narrative:  71 yo AAM with hx of aggressive merkel cell cancer, prior hx of CVA, type 2 DM, ischemic cardiomyopathy, chronic systolic CHF, ckd stage 3a, presents to ER via EMS due to slurred speech, vomiting blood. Pt had aspirated at home or during EMS transport.Patient diagnosed with Merkel cell cancer in July 2023 after noticing right inguinal adenopathy for several months.Patient had a rough time with immunotherapy.  Oncology did not think that he would tolerate chemotherapy.  Patient underwent 1 round of radiation treatment but he did not tolerate it well.  On arrival, temp unrecorded.  Heart rate 146 blood pressure 115/66.CT of the head demonstrated chronic ischemic infarcts of the left MCA left posterior frontal right thalamus and superior left cerebellum. 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  He also has bulky adenopathy in the right iliac and pelvic sidewall.   EDP and admitting provider discussed with daughter [name].  She wants to keep the patient comfortable.  She understands that the patient may die in the hospital.   Assessment & Plan:   Admission for terminal care: -At the time of admission discussion was held with patient daughter who agreed with full comfort measures. -Palliative care consulted.  Placed on comfort measures.  Upper GI bleed: -Patient had coffee-ground emesis.  History of esophagitis  Aspiration pneumonia: -Currently on room air.  Continue as needed suctioning  CVA: -Neurology unsure of pt's altered mentation a results of new vs old stroke. Agree with neurology assessment that either would not make a difference in this patient's terminal condition. Given his UGI bleeding, pt is not a TNK candidate.   Ogilvie syndrome -Patient's daughter states that pt would not keep NG tube in place and  does not want NG tube placed.   Merkel cell carcinoma nodal presentation [facility]) -Oncology recommending home hospice care.  Palliative care consulted for EOL care and transfer to inpatient hospice unit if possible during holiday weekend.  DVT prophylaxis: None Code Status: DNR/DNI Family Communication:  None present at bedside.   Disposition Plan: Home versus inpatient hospice  Consultants:  Palliative care Neurology  Procedures:  None  Antimicrobials:  None  Status is: Inpatient    Subjective: Patient seen and examined.  Resting comfortably on the bed.  Objective: Vitals:   07/02/2022 2215 07/15/2022 2230 07/17/2022 2245 07/14/2022 2342  BP: 101/65 126/82 121/76 117/69  Pulse: (!) 136 (!) 135 (!) 130 (!) 129  Resp: 14 13 (!) 23 16  Temp:    98.6 F (37 C)  TempSrc:    Oral  SpO2: 100% 100% 100% 99%  Weight:      Height:       No intake or output data in the 24 hours ending 07/11/22 0958 Filed Weights   07/17/2022 2000  Weight: 65 kg    Examination:  General exam: Appears calm and comfortable, on room air, appears weak, sick, not in respiratory distress Respiratory system: Clear to auscultation. Respiratory effort normal. Cardiovascular system: Tachycardic, no murmur.   Gastrointestinal system: Abdomen is distended,   Data Reviewed: I have personally reviewed following labs and imaging studies  CBC: Recent Labs  Lab 07/15/2022 2011 06/29/2022 2045  WBC  --  9.1  NEUTROABS  --  6.8  HGB 9.5* 9.4*  HCT 28.0* 30.0*  MCV  --  84.7  PLT  --  433*  Basic Metabolic Panel: Recent Labs  Lab 07/06/2022 2011 06/28/2022 2045  NA 142 141  K 5.0 4.6  CL 108 104  CO2  --  25  GLUCOSE 276* 271*  BUN 65* 64*  CREATININE 1.70* 2.04*  CALCIUM  --  9.4   GFR: Estimated Creatinine Clearance: 31 mL/min (A) (by C-G formula based on SCr of 2.04 mg/dL (H)). Liver Function Tests: Recent Labs  Lab 07/16/2022 2045  AST 38  ALT 17  ALKPHOS 83  BILITOT 0.4  PROT 7.5  ALBUMIN  2.7*   No results for input(s): "LIPASE", "AMYLASE" in the last 168 hours. No results for input(s): "AMMONIA" in the last 168 hours. Coagulation Profile: Recent Labs  Lab 07/15/2022 2045  INR 1.2   Cardiac Enzymes: No results for input(s): "CKTOTAL", "CKMB", "CKMBINDEX", "TROPONINI" in the last 168 hours. BNP (last 3 results) No results for input(s): "PROBNP" in the last 8760 hours. HbA1C: No results for input(s): "HGBA1C" in the last 72 hours. CBG: Recent Labs  Lab 07/08/2022 2010  GLUCAP 253*   Lipid Profile: No results for input(s): "CHOL", "HDL", "LDLCALC", "TRIG", "CHOLHDL", "LDLDIRECT" in the last 72 hours. Thyroid Function Tests: No results for input(s): "TSH", "T4TOTAL", "FREET4", "T3FREE", "THYROIDAB" in the last 72 hours. Anemia Panel: No results for input(s): "VITAMINB12", "FOLATE", "FERRITIN", "TIBC", "IRON", "RETICCTPCT" in the last 72 hours. Sepsis Labs: No results for input(s): "PROCALCITON", "LATICACIDVEN" in the last 168 hours.  No results found for this or any previous visit (from the past 240 hour(s)).    Radiology Studies: CT ABDOMEN PELVIS WO CONTRAST  Result Date: 06/28/2022 CLINICAL DATA:  Nausea, vomiting, ileus, abdominal pain * Tracking Code: BO * EXAM: CT ABDOMEN AND PELVIS WITHOUT CONTRAST TECHNIQUE: Multidetector CT imaging of the abdomen and pelvis was performed following the standard protocol without IV contrast. RADIATION DOSE REDUCTION: This exam was performed according to the departmental dose-optimization program which includes automated exposure control, adjustment of the mA and/or kV according to patient size and/or use of iterative reconstruction technique. COMPARISON:  06/24/2022 FINDINGS: Lower chest: Coronary artery calcifications and stents. Heterogeneous and ground-glass airspace opacity of the dependent right lung base, similar to prior examination. Patulous, fluid-filled lower esophagus. Hepatobiliary: No solid liver abnormality is seen.  No gallstones, gallbladder wall thickening, or biliary dilatation. Pancreas: Unremarkable. No pancreatic ductal dilatation or surrounding inflammatory changes. Spleen: Normal in size without significant abnormality. Adrenals/Urinary Tract: Adrenal glands are unremarkable. Multiple punctuate bilateral nonobstructive renal calculi no ureteral calculi or hydronephrosis. Bladder is unremarkable. Stomach/Bowel: Stomach is within normal limits. Normal appendix. Diffusely distended, stool-filled colon, with a large burden of stool present to the rectum. The transverse colon measures up to 7.6 cm. Vascular/Lymphatic: Aortic atherosclerosis. Unchanged right inguinal bulky mass or lymph node conglomerate measuring 10.3 x 6.2 cm (series 3, image 91). Unchanged right iliac and pelvic sidewall lymphadenopathy right external iliac nodes measuring up to 4.3 x 2.9 cm (series 3, image 79). Reproductive: No mass or other significant abnormality. Other: No abdominal wall hernia or abnormality. No ascites. Musculoskeletal: No acute or significant osseous findings. Soft tissue edema of the included right lower extremity. IMPRESSION: 1. Diffusely distended, stool-filled colon, with a large burden of stool present to the rectum. The transverse colon measures up to 7.6 cm. 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 2. Unchanged right inguinal bulky mass or lymph node conglomerate and associated right iliac and pelvic sidewall lymphadenopathy. Findings are consistent with lymphoma or metastatic disease. 3. Soft tissue edema of the included right lower  extremity, likely related to vascular or lymphatic outflow obstruction secondary to mass. 4. Heterogeneous and ground-glass airspace opacity of the dependent right lung base, similar to prior examination and consistent with infection or aspiration. 5. Patulous, fluid-filled lower esophagus. Aortic Atherosclerosis (ICD10-I70.0). Electronically Signed   By: Delanna Ahmadi  M.D.   On: 07/13/2022 20:48   CT HEAD CODE STROKE WO CONTRAST  Result Date: 06/23/2022 CLINICAL DATA:  Code stroke. EXAM: CT HEAD WITHOUT CONTRAST TECHNIQUE: Contiguous axial images were obtained from the base of the skull through the vertex without intravenous contrast. RADIATION DOSE REDUCTION: This exam was performed according to the departmental dose-optimization program which includes automated exposure control, adjustment of the mA and/or kV according to patient size and/or use of iterative reconstruction technique. COMPARISON:  Prior CT from 06/24/2022. FINDINGS: Brain: Atrophy with chronic small vessel ischemic disease. Remote left MCA territory infarct involving the posterior left frontal lobe. Additional chronic infarcts involving the ventral right thalamus and superior left cerebellum. Small remote lacunar infarct at the right frontal corona radiata. No acute intracranial hemorrhage. No acute large vessel territory infarct. No mass lesion or midline shift. No hydrocephalus or extra-axial fluid collection. Vascular: No abnormal hyperdense vessel. Calcified atherosclerosis present at the skull base. Skull: Scalp soft tissues and calvarium within normal limits. Sinuses/Orbits: Globes orbital soft tissues demonstrate no acute finding. Scattered mucosal thickening noted about the ethmoidal air cells. Paranasal sinuses are otherwise clear. No mastoid effusion. Other: None. ASPECTS [facility] Stroke Program Early CT Score) - Ganglionic level infarction (caudate, lentiform nuclei, internal capsule, insula, M1-M3 cortex): 7 - Supraganglionic infarction (M4-M6 cortex): 3 Total score (0-10 with 10 being normal): 10 IMPRESSION: 1. No acute intracranial abnormality. 2. ASPECTS is 10. 3. Atrophy with chronic small vessel ischemic disease with multiple chronic ischemic infarcts as above, stable. These results were communicated to Dr. Leonel Ramsay at 8:25 pm on 07/16/2022 by text page via the [facility] messaging system.  Electronically Signed   By: Jeannine Boga M.D.   On: 07/01/2022 20:27    Scheduled Meds:  pantoprazole (PROTONIX) IV  40 mg Intravenous BID   scopolamine  1 patch Transdermal Q72H   sodium chloride flush  3 mL Intravenous Q12H   Continuous Infusions:  sodium chloride     sodium chloride       LOS: 1 day   Time spent: 15 minutes  Soundra Lampley Loann Quill, MD [facility]  If 7PM-7AM, please contact night-coverage [email or web address] 07/11/2022, 9:58 AM

## 2022-07-12 ENCOUNTER — Other Ambulatory Visit: Payer: Self-pay

## 2022-07-12 DIAGNOSIS — C4A9 Merkel cell carcinoma, unspecified: Secondary | ICD-10-CM | POA: Diagnosis not present

## 2022-07-12 DIAGNOSIS — R112 Nausea with vomiting, unspecified: Secondary | ICD-10-CM | POA: Diagnosis not present

## 2022-07-12 DIAGNOSIS — R400 Somnolence: Secondary | ICD-10-CM | POA: Diagnosis not present

## 2022-07-12 DIAGNOSIS — Z515 Encounter for palliative care: Secondary | ICD-10-CM | POA: Diagnosis not present

## 2022-07-12 MED ORDER — LACTULOSE ENEMA
300.0000 mL | Freq: Once | ORAL | Status: AC
  Administered 2022-07-12: 300 mL via RECTAL
  Filled 2022-07-12: qty 300

## 2022-07-12 MED ORDER — LORAZEPAM 2 MG/ML IJ SOLN: 1.0000 mg | Freq: Four times a day (QID) | INTRAMUSCULAR | Status: DC

## 2022-07-20 NOTE — Progress Notes (Signed)
Pt passed at 17:03.  MD notified.  Sophia, RN second verified death.  Family at bedside.    175 ml of Fentanyl wasted with charge RN, Sharee Pimple.

## 2022-07-20 NOTE — Progress Notes (Signed)
Per family request, lactulose enema administered for pt comfort.  Enema retained for 30+ minutes.  Minimal output - mainly liquid.  PRN medications administered for comfort throughout procedure.  Pt repositioned for comfort.  Will continue to provide support to pt and family.

## 2022-07-20 NOTE — Progress Notes (Signed)
Wasted 175 ml of fentanyl drip with Josie Dixon, RN.

## 2022-07-20 NOTE — Death Summary Note (Signed)
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 Pt had aspirated at home or during EMS transport.Patient diagnosed with Merkel cell cancer in July 2023 after noticing right inguinal adenopathy for several months.Patient had a rough time with immunotherapy.  Oncology did not think that he would tolerate chemotherapy.  Patient underwent 1 round of radiation treatment but he did not tolerate it well.   On arrival, temp unrecorded.  Heart rate 146 blood pressure 115/66.CT of the head demonstrated chronic ischemic infarcts of the left MCA left posterior frontal right thalamus and superior left cerebellum. 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  He also had bulky adenopathy in the right iliac and pelvic sidewall.   EDP and admitting provider discussed with daughter Antonio Guerra.  She wanted to keep the patient comfortable.  She understands that the patient may die in the hospital.     Assessment & Plan:   End of life care: -At the time of admission discussion was held with patient daughter who agreed with full comfort measures. -Palliative care consulted.  Placed on comfort measures. -Patient died peacefully at 17:03   Upper GI  bleed: -Patient had coffee-ground emesis.  History of esophagitis   Aspiration pneumonia: -Placed on [place].  placed on as needed suctioning   CVA: -Neurology unsure of pt's altered mentation a results of new vs old stroke. Agreed with neurology assessment that either would not make a difference in this patient's terminal condition. Given his UGI bleeding, pt was not a TNK candidate.    Abdominal distention: Ogilvie syndrome -In the setting of large stool burden. -Patient did not tolerated NG tube placement.  Daughter requested for enema. Enema given as per request   Merkel cell carcinoma nodal presentation [facility]) -followed by oncology outpatient.over all had poor prognosis.   Time of death: 17:03  Signed:  Mckinley Jewel  [facility] 07/18/22, 5:21 PM

## 2022-07-20 NOTE — Progress Notes (Signed)
PROGRESS NOTE    Antonio Guerra  NGE:[id number] DOB: February 18, 1951 DOA: 07/09/2022 PCP: [name], MD   Brief Narrative:  72 yo AAM with hx of aggressive merkel cell cancer, prior hx of CVA, type 2 DM, ischemic cardiomyopathy, chronic systolic CHF, ckd stage 3a, presents to ER via EMS due to slurred speech, vomiting blood. Pt had aspirated at home or during EMS transport.Patient diagnosed with Merkel cell cancer in July 2023 after noticing right inguinal adenopathy for several months.Patient had a rough time with immunotherapy.  Oncology did not think that he would tolerate chemotherapy.  Patient underwent 1 round of radiation treatment but he did not tolerate it well.  On arrival, temp unrecorded.  Heart rate 146 blood pressure 115/66.CT of the head demonstrated chronic ischemic infarcts of the left MCA left posterior frontal right thalamus and superior left cerebellum. 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  He also has bulky adenopathy in the right iliac and pelvic sidewall.   EDP and admitting provider discussed with daughter [name].  She wants to keep the patient comfortable.  She understands that the patient may die in the hospital.   Assessment & Plan:   End of life care: -At the time of admission discussion was held with patient daughter who agreed with full comfort measures. -Palliative care consulted.  Placed on comfort measures.  Upper GI bleed: -Patient had coffee-ground emesis.  History of esophagitis  Aspiration pneumonia: -Currently on [employer].  Continue as needed suctioning  CVA: -Neurology unsure of pt's altered mentation a results of new vs old stroke. Agree with neurology assessment that either would not make a difference in this patient's terminal condition. Given his UGI bleeding, pt is not a TNK candidate.   Abdominal distention: -In the setting of large stool burden. -Patient did not tolerated NG tube  placement.  Daughter requested for enema.  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   Merkel cell carcinoma nodal presentation [facility]) -Oncology recommending home hospice care.  Palliative care consulted for EOL care and transfer to inpatient hospice unit if possible during holiday weekend.  Other medical problems including: Type 2 diabetes Ischemic cardiomyopathy Chronic systolic CHF CKD stage IIIa  DVT prophylaxis: None Code Status: DNR/DNI Family Communication: Daughter is not at the bedside. Disposition Plan: Home versus inpatient hospice  Consultants:  Palliative care Neurology  Procedures:  None  Antimicrobials:  None  Status is: Inpatient    Subjective: Patient seen and examined.  Resting comfortably on the bed.  Daughter at the bedside.  Concerned that patient was moaning in pain all night.  She requested for edema since she thinks that his pain/discomfort is likely due to abdominal distention.  objective: Vitals:   07/18/2022 2342 07/11/22 2240 Jul 23, 2022 0327 July 23, 2022 0750  BP: 117/69 125/79 133/79 (!) 141/72  Pulse: (!) 129 (!) 127 (!) 132 (!) 129  Resp: '16 16 18 15  '$ Temp: 98.6 F (37 C) 98.8 F (37.1 C) 99.3 F (37.4 C) 98.4 F (36.9 C)  TempSrc: Oral Oral Oral Oral  SpO2: 99% 98% 100% 100%  Weight:      Height:        Intake/Output Summary (Last 24 hours) at 07-23-22 1043 Last data filed at 07/23/22 0600 Gross per 24 hour  Intake 231.71 ml  Output --  Net 231.71 ml   Filed Weights   07/01/2022 2000  Weight: 65  kg    Examination:  General exam: Appears calm and comfortable, on nasal cannula appears weak, sick, not in respiratory distress Respiratory system: Clear to auscultation. Respiratory effort normal. Cardiovascular system: Tachycardic, no murmur.   Gastrointestinal system: Abdomen is distended,   Data Reviewed: I have personally reviewed following labs  and imaging studies  CBC: Recent Labs  Lab 06/19/2022 2011 07/02/2022 2045  WBC  --  9.1  NEUTROABS  --  6.8  HGB 9.5* 9.4*  HCT 28.0* 30.0*  MCV  --  84.7  PLT  --  433*    Basic Metabolic Panel: Recent Labs  Lab 07/11/2022 2011 07/13/2022 2045  NA 142 141  K 5.0 4.6  CL 108 104  CO2  --  25  GLUCOSE 276* 271*  BUN 65* 64*  CREATININE 1.70* 2.04*  CALCIUM  --  9.4    GFR: Estimated Creatinine Clearance: 31 mL/min (A) (by C-G formula based on SCr of 2.04 mg/dL (H)). Liver Function Tests: Recent Labs  Lab 07/11/2022 2045  AST 38  ALT 17  ALKPHOS 83  BILITOT 0.4  PROT 7.5  ALBUMIN 2.7*    No results for input(s): "LIPASE", "AMYLASE" in the last 168 hours. No results for input(s): "AMMONIA" in the last 168 hours. Coagulation Profile: Recent Labs  Lab 06/23/2022 2045  INR 1.2    Cardiac Enzymes: No results for input(s): "CKTOTAL", "CKMB", "CKMBINDEX", "TROPONINI" in the last 168 hours. BNP (last 3 results) No results for input(s): "PROBNP" in the last 8760 hours. HbA1C: No results for input(s): "HGBA1C" in the last 72 hours. CBG: Recent Labs  Lab 07/09/2022 2010  GLUCAP 253*    Lipid Profile: No results for input(s): "CHOL", "HDL", "LDLCALC", "TRIG", "CHOLHDL", "LDLDIRECT" in the last 72 hours. Thyroid Function Tests: No results for input(s): "TSH", "T4TOTAL", "FREET4", "T3FREE", "THYROIDAB" in the last 72 hours. Anemia Panel: No results for input(s): "VITAMINB12", "FOLATE", "FERRITIN", "TIBC", "IRON", "RETICCTPCT" in the last 72 hours. Sepsis Labs: No results for input(s): "PROCALCITON", "LATICACIDVEN" in the last 168 hours.  No results found for this or any previous visit (from the past 240 hour(s)).    Radiology Studies: DG CHEST PORT 1 VIEW  Result Date: 07/11/2022 CLINICAL DATA:  NG tube placement. EXAM: PORTABLE CHEST 1 VIEW COMPARISON:  06/24/2022 FINDINGS: NG tube tip projects at the level of the proximal thoracic esophagus and appears to be  coiled in the hypopharynx although this region is incompletely visualized. Lungs are clear bilaterally. The cardiopericardial silhouette is within normal limits for size. Skin folds overlie the left lung base. IMPRESSION: NG tube tip projects at the level of the proximal thoracic esophagus and more proximally, appears to be coiled in the hypopharynx. NG tube removal and repositioning likely warranted. These results will be called to the ordering clinician or representative by the Radiologist Assistant, and communication documented in the PACS or [employer]. Electronically Signed   By: Misty Stanley M.D.   On: 07/11/2022 16:31   DG Abd 1 View  Result Date: 07/11/2022 CLINICAL DATA:  Enteric catheter placement EXAM: ABDOMEN - 1 VIEW COMPARISON:  06/01/2022, 06/23/2022 FINDINGS: Two frontal semi-erect views of the lower chest and upper abdomen are obtained. There is no enteric catheter identified on this study. Large amount of gas and stool throughout the colon is again noted unchanged since recent CT. Lung bases are clear. IMPRESSION: 1. There is no enteric catheter identified on this exam, from the lung bases through the lower abdomen. X-ray of the chest  may be useful to assess for malpositioned catheter. 2. Continued colonic distension unchanged since CT. These results will be called to the ordering clinician or representative by the Radiologist Assistant, and communication documented in the PACS or [employer]. Electronically Signed   By: Randa Ngo M.D.   On: 07/11/2022 15:54   CT ABDOMEN PELVIS WO CONTRAST  Result Date: 07/04/2022 CLINICAL DATA:  Nausea, vomiting, ileus, abdominal pain * Tracking Code: BO * EXAM: CT ABDOMEN AND PELVIS WITHOUT CONTRAST TECHNIQUE: Multidetector CT imaging of the abdomen and pelvis was performed following the standard protocol without IV contrast. RADIATION DOSE REDUCTION: This exam was performed according to the departmental dose-optimization program which  includes automated exposure control, adjustment of the mA and/or kV according to patient size and/or use of iterative reconstruction technique. COMPARISON:  06/24/2022 FINDINGS: Lower chest: Coronary artery calcifications and stents. Heterogeneous and ground-glass airspace opacity of the dependent right lung base, similar to prior examination. Patulous, fluid-filled lower esophagus. Hepatobiliary: No solid liver abnormality is seen. No gallstones, gallbladder wall thickening, or biliary dilatation. Pancreas: Unremarkable. No pancreatic ductal dilatation or surrounding inflammatory changes. Spleen: Normal in size without significant abnormality. Adrenals/Urinary Tract: Adrenal glands are unremarkable. Multiple punctuate bilateral nonobstructive renal calculi no ureteral calculi or hydronephrosis. Bladder is unremarkable. Stomach/Bowel: Stomach is within normal limits. Normal appendix. Diffusely distended, stool-filled colon, with a large burden of stool present to the rectum. The transverse colon measures up to 7.6 cm. Vascular/Lymphatic: Aortic atherosclerosis. Unchanged right inguinal bulky mass or lymph node conglomerate measuring 10.3 x 6.2 cm (series 3, image 91). Unchanged right iliac and pelvic sidewall lymphadenopathy right external iliac nodes measuring up to 4.3 x 2.9 cm (series 3, image 79). Reproductive: No mass or other significant abnormality. Other: No abdominal wall hernia or abnormality. No ascites. Musculoskeletal: No acute or significant osseous findings. Soft tissue edema of the included right lower extremity. IMPRESSION: 1. Diffusely distended, stool-filled colon, with a large burden of stool present to the rectum. The transverse colon measures up to 7.6 cm. 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 2. Unchanged right inguinal bulky mass or lymph node conglomerate and associated right iliac and pelvic sidewall lymphadenopathy. Findings are consistent with lymphoma or  metastatic disease. 3. Soft tissue edema of the included right lower extremity, likely related to vascular or lymphatic outflow obstruction secondary to mass. 4. Heterogeneous and ground-glass airspace opacity of the dependent right lung base, similar to prior examination and consistent with infection or aspiration. 5. Patulous, fluid-filled lower esophagus. Aortic Atherosclerosis (ICD10-I70.0). Electronically Signed   By: Delanna Ahmadi M.D.   On: 06/19/2022 20:48   CT HEAD CODE STROKE WO CONTRAST  Result Date: 07/13/2022 CLINICAL DATA:  Code stroke. EXAM: CT HEAD WITHOUT CONTRAST TECHNIQUE: Contiguous axial images were obtained from the base of the skull through the vertex without intravenous contrast. RADIATION DOSE REDUCTION: This exam was performed according to the departmental dose-optimization program which includes automated exposure control, adjustment of the mA and/or kV according to patient size and/or use of iterative reconstruction technique. COMPARISON:  Prior CT from 06/24/2022. FINDINGS: Brain: Atrophy with chronic small vessel ischemic disease. Remote left MCA territory infarct involving the posterior left frontal lobe. Additional chronic infarcts involving the ventral right thalamus and superior left cerebellum. Small remote lacunar infarct at the right frontal corona radiata. No acute intracranial hemorrhage. No acute large vessel territory infarct. No mass lesion or midline shift. No hydrocephalus or extra-axial fluid collection. Vascular: No abnormal hyperdense vessel. Calcified atherosclerosis present at the  skull base. Skull: Scalp soft tissues and calvarium within normal limits. Sinuses/Orbits: Globes orbital soft tissues demonstrate no acute finding. Scattered mucosal thickening noted about the ethmoidal air cells. Paranasal sinuses are otherwise clear. No mastoid effusion. Other: None. ASPECTS [facility] Stroke Program Early CT Score) - Ganglionic level infarction (caudate, lentiform  nuclei, internal capsule, insula, M1-M3 cortex): 7 - Supraganglionic infarction (M4-M6 cortex): 3 Total score (0-10 with 10 being normal): 10 IMPRESSION: 1. No acute intracranial abnormality. 2. ASPECTS is 10. 3. Atrophy with chronic small vessel ischemic disease with multiple chronic ischemic infarcts as above, stable. These results were communicated to Dr. Leonel Ramsay at 8:25 pm on 06/30/2022 by text page via the [facility] messaging system. Electronically Signed   By: Jeannine Boga M.D.   On: 06/29/2022 20:27    Scheduled Meds:  lactulose  300 mL Rectal Once   pantoprazole (PROTONIX) IV  40 mg Intravenous BID   scopolamine  1 patch Transdermal Q72H   Continuous Infusions:  sodium chloride     fentaNYL infusion INTRAVENOUS 100 mcg/hr (2022/07/19 0931)     LOS: 2 days   Time spent: 15 minutes  Mahalie Kanner Loann Quill, MD [facility]  If 7PM-7AM, please contact night-coverage [email or web address] 07/19/22, 10:43 AM

## 2022-07-20 NOTE — Progress Notes (Signed)
Palliative Medicine Inpatient Follow Up Note    HPI:  Per intake H&P --> 72 yo AAM with hx of aggressive merkel cell cancer, prior hx of CVA, type 2 DM, ischemic cardiomyopathy, chronic systolic CHF, ckd stage 3a, presents to ER via EMS due to slurred speech, vomiting blood. Is on end of life care at home through [facility].   Palliative care involved to further help discuss symptom management related to end of life care.    Chart Reviewed. Patient assessed at the bedside. Daughter and other family members at the bedside.   Mr. Froman is resting comfortably. Per discussions with my colleague on 12/23 involving Arya's daughter/POA [name] decisions made for comfort focused care.   Currently on low-dose fentanyl drip with boluses and titration. Unable to insert NG tube on yesterday. Scheduled for enema today for comfort.   I created space for [name] to share thoughts and feelings. She does not want her father to suffer. She feels he was not comfortable throughout the night with occasional moaning and restlessness. She is appreciative of his level of comfort at this time after receiving ativan.   We discussed signs of end-of-life and aggressively managing symptoms. She verbalized understanding. She understands the nursing staff will assess patient and administer as needed medications based on physical symptoms. Advised if she notices changes while at the bedside to notify patient's nurse.   [name] shares she was hopeful her father would improve but realized he has continued to decline over the past several months despite medical interventions. She shares her experience losing her mother and brother. She is emotional sharing her father was her last immediate family member outside of her children. Emotional support provided.   All questions addressed and support provided.    Objective Assessment: Vital Signs Vitals:   13-Jul-2022 0327 07/13/2022 0750  BP: 133/79 (!) 141/72   Pulse: (!) 132 (!) 129  Resp: 18 15  Temp: 99.3 F (37.4 C) 98.4 F (36.9 C)  SpO2: 100% 100%    Intake/Output Summary (Last 24 hours) at 2022/07/13 1215 Last data filed at 07-13-2022 0600 Gross per 24 hour  Intake 231.71 ml  Output --  Net 231.71 ml   Last Weight  Most recent update: 07/19/2022  8:11 PM    Weight  65 kg (143 lb 4.8 oz)            Gen:  somnolent, chronically-ill appearing  HEENT: moist mucous membranes CV: RRR PULM: diminished  ABD: soft/nontender/distended/normal bowel sounds EXT: No edema Neuro: Somnolent   SUMMARY OF RECOMMENDATIONS   Continue with current plan of care focusing on comfort Fentanyl drip with boluses and titration Ativan every 6 hours and as needed  Comfort cart at the bedside for family RN to administer enema once items arrive at bedside.  PMT will continue to support and follow on as needed basis. Please secure chat for urgent needs.   Discussed with RN.   Time Total: 45 min   Visit consisted of counseling and education dealing with the complex and emotionally intense issues of symptom management and palliative care in the setting of serious and potentially life-threatening illness.Greater than 50%  of this time was spent counseling and coordinating care related to the above assessment and plan.  Alda Lea, AGPCNP-BC  [place]  [phone]  Palliative Medicine Team providers are available by phone from 7am to 7pm daily and can be reached through the team cell phone. Should this patient require  assistance outside of these hours, please call the patient's attending physician.

## 2022-07-20 DEATH — deceased
